# Patient Record
Sex: Male | Born: 1935 | Race: White | Hispanic: No | Marital: Married | Smoking: Former smoker
Health system: Southern US, Community
[De-identification: ages and names within clinical notes are randomized; demographics above are authoritative.]

## PROBLEM LIST (undated history)

## (undated) DIAGNOSIS — I6012 Nontraumatic subarachnoid hemorrhage from left middle cerebral artery: Secondary | ICD-10-CM

## (undated) DIAGNOSIS — K922 Gastrointestinal hemorrhage, unspecified: Secondary | ICD-10-CM

## (undated) DIAGNOSIS — I951 Orthostatic hypotension: Secondary | ICD-10-CM

## (undated) DIAGNOSIS — I10 Essential (primary) hypertension: Secondary | ICD-10-CM

## (undated) DIAGNOSIS — Z95818 Presence of other cardiac implants and grafts: Secondary | ICD-10-CM

## (undated) DIAGNOSIS — C61 Malignant neoplasm of prostate: Secondary | ICD-10-CM

## (undated) DIAGNOSIS — L03113 Cellulitis of right upper limb: Secondary | ICD-10-CM

## (undated) DIAGNOSIS — C189 Malignant neoplasm of colon, unspecified: Secondary | ICD-10-CM

## (undated) DIAGNOSIS — G20 Parkinson's disease: Secondary | ICD-10-CM

## (undated) DIAGNOSIS — Z79899 Other long term (current) drug therapy: Secondary | ICD-10-CM

## (undated) DIAGNOSIS — G459 Transient cerebral ischemic attack, unspecified: Secondary | ICD-10-CM

## (undated) DIAGNOSIS — R55 Syncope and collapse: Secondary | ICD-10-CM

## (undated) DIAGNOSIS — G4700 Insomnia, unspecified: Secondary | ICD-10-CM

## (undated) DIAGNOSIS — D649 Anemia, unspecified: Secondary | ICD-10-CM

## (undated) DIAGNOSIS — R404 Transient alteration of awareness: Secondary | ICD-10-CM

## (undated) DIAGNOSIS — E785 Hyperlipidemia, unspecified: Secondary | ICD-10-CM

## (undated) DIAGNOSIS — S065X9A Traumatic subdural hemorrhage with loss of consciousness of unspecified duration, initial encounter: Secondary | ICD-10-CM

## (undated) DIAGNOSIS — G4733 Obstructive sleep apnea (adult) (pediatric): Secondary | ICD-10-CM

## (undated) DIAGNOSIS — I639 Cerebral infarction, unspecified: Secondary | ICD-10-CM

## (undated) DIAGNOSIS — I480 Paroxysmal atrial fibrillation: Secondary | ICD-10-CM

## (undated) DIAGNOSIS — E559 Vitamin D deficiency, unspecified: Secondary | ICD-10-CM

## (undated) DIAGNOSIS — W19XXXA Unspecified fall, initial encounter: Secondary | ICD-10-CM

## (undated) HISTORY — PX: HERNIA REPAIR: SHX51

## (undated) HISTORY — DX: Obstructive sleep apnea (adult) (pediatric): G47.33

## (undated) HISTORY — DX: Anemia, unspecified: D64.9

## (undated) HISTORY — DX: Cerebral infarction, unspecified: I63.9

## (undated) HISTORY — DX: Malignant neoplasm of prostate: C61

## (undated) HISTORY — DX: Parkinson's disease: G20

## (undated) HISTORY — DX: Presence of other cardiac implants and grafts: Z95.818

## (undated) HISTORY — DX: Malignant neoplasm of colon, unspecified: C18.9

## (undated) HISTORY — DX: Hyperlipidemia, unspecified: E78.5

## (undated) HISTORY — DX: Essential (primary) hypertension: I10

## (undated) HISTORY — PX: ORCHIECTOMY: SHX2116

## (undated) HISTORY — DX: Nontraumatic subarachnoid hemorrhage from left middle cerebral artery: I60.12

## (undated) HISTORY — DX: Transient cerebral ischemic attack, unspecified: G45.9

## (undated) HISTORY — DX: Transient alteration of awareness: R40.4

## (undated) HISTORY — DX: Cellulitis of right upper limb: L03.113

## (undated) HISTORY — DX: Traumatic subdural hemorrhage with loss of consciousness of unspecified duration, initial encounter: S06.5X9A

## (undated) HISTORY — DX: Insomnia, unspecified: G47.00

## (undated) HISTORY — DX: Unspecified fall, initial encounter: W19.XXXA

## (undated) HISTORY — DX: Orthostatic hypotension: I95.1

## (undated) HISTORY — PX: PROSTATECTOMY: SHX69

## (undated) HISTORY — DX: Other long term (current) drug therapy: Z79.899

## (undated) HISTORY — DX: Syncope and collapse: R55

## (undated) HISTORY — DX: Vitamin D deficiency, unspecified: E55.9

## (undated) HISTORY — DX: Gastrointestinal hemorrhage, unspecified: K92.2

## (undated) HISTORY — DX: Paroxysmal atrial fibrillation: I48.0

## (undated) HISTORY — PX: COLECTOMY: SHX59

---

## 2000-08-03 ENCOUNTER — Encounter: Payer: Self-pay | Admitting: Emergency Medicine

## 2000-08-04 ENCOUNTER — Inpatient Hospital Stay (HOSPITAL_COMMUNITY): Admission: EM | Admit: 2000-08-04 | Discharge: 2000-08-04 | Payer: Self-pay | Admitting: Emergency Medicine

## 2012-09-23 DIAGNOSIS — C61 Malignant neoplasm of prostate: Secondary | ICD-10-CM | POA: Insufficient documentation

## 2012-09-23 DIAGNOSIS — I10 Essential (primary) hypertension: Secondary | ICD-10-CM

## 2012-09-23 HISTORY — DX: Essential (primary) hypertension: I10

## 2014-05-31 ENCOUNTER — Ambulatory Visit (INDEPENDENT_AMBULATORY_CARE_PROVIDER_SITE_OTHER): Payer: Medicare Other | Admitting: Pulmonary Disease

## 2014-05-31 ENCOUNTER — Other Ambulatory Visit: Payer: Self-pay | Admitting: *Deleted

## 2014-05-31 ENCOUNTER — Encounter: Payer: Self-pay | Admitting: *Deleted

## 2014-05-31 ENCOUNTER — Encounter (INDEPENDENT_AMBULATORY_CARE_PROVIDER_SITE_OTHER): Payer: Self-pay

## 2014-05-31 VITALS — BP 132/72 | HR 67 | Temp 98.0°F | Ht 72.0 in | Wt 172.6 lb

## 2014-05-31 DIAGNOSIS — G4733 Obstructive sleep apnea (adult) (pediatric): Secondary | ICD-10-CM | POA: Insufficient documentation

## 2014-05-31 NOTE — Patient Instructions (Signed)
Will set your machine on the auto setting, which will adjust itself to your needs.   Keep up with mask changes and supplies. followup with me again in one year, but call if issues arise with your cpap.

## 2014-05-31 NOTE — Assessment & Plan Note (Signed)
The patient has a history of obstructive sleep apnea, and he has been on C Pap since diagnosis 5 years ago. He is wearing the device compliantly, and has seen a definite improvement in his sleep and daytime alertness and energy level. He has not had any adjustments to his machine over the years, and unfortunately we are not able to get a download today because his card is not pushed into the device properly.  I would like to put his pressure on the automatic setting, and get a download definitely 6 weeks to see how things are going. I have also encouraged him to keep up with his mask changes and supplies, and let us know if he is not sleeping well with the device. I would like to see him back in one year if things are going well.

## 2014-05-31 NOTE — Progress Notes (Signed)
Subjective:    Patient ID: Joe Glover, male    DOB: Jul 26, 1924, 80 y.o.   MRN: [id number]  HPI The patient is an 80 year old male who I've been asked to see for management of obstructive sleep apnea. He tells me that he was diagnosed 5 years ago, and has been on C Pap since that time. He has been compliant with the device, and feels that it has definitely helped his sleep and daytime alertness. He currently uses a full face mask, and he has kept up with exchanges. His pressure has not been checked or changed since he has gotten the device, and unfortunately his card is not pushed into the device properly in order for [place] to record and also download data. The wife has not heard breakthrough snoring, and the patient feels that he is rested in the morning if he has a normal night. He has no significant sleepiness during the day with inactivity, but does take a daily afternoon nap. He denies any sleepiness with driving, but his wife states he does this very rarely and only locally. The patient states that his weight is been neutral over the last few years, and his Epworth score today is 13.   Sleep Questionnaire What time do you typically go to bed?( Between what hours) 10:30 PM 10:30 PM at 1456 on 05/31/14 by Inge Rise, CMA How long does it take you to fall asleep? 30 min 30 min at 1456 on 05/31/14 by Inge Rise, CMA How many times during the night do you wake up? What time do you get out of bed to start your day? 0730 0730 at 1456 on 05/31/14 by Inge Rise, CMA Do you drive or operate heavy machinery in your occupation? No No at 1456 on 05/31/14 by Inge Rise, CMA How much has your weight changed (up or down) over the past two years? (In pounds) 5 lb (2.268 kg) 5 lb (2.268 kg) at 1456 on 05/31/14 by Inge Rise, CMA Have you ever had a sleep study before? Yes Yes at 1456 on 05/31/14 by Inge Rise, CMA If yes, location of study? Dr. Alcide [facility] office Dr. [facility] office at  1456 on 05/31/14 by Inge Rise, CMA If yes, date of study? 5 years ago 5 years ago at 1456 on 05/31/14 by Inge Rise, CMA Do you currently use CPAP? Yes Yes at 1456 on 05/31/14 by Inge Rise, CMA If so, what pressure? not sure not sure at 1456 on 05/31/14 by Inge Rise, CMA Do you wear oxygen at any time? No    Review of Systems  Constitutional: Negative for fever and unexpected weight change.  HENT: Negative for congestion, dental problem, ear pain, nosebleeds, postnasal drip, rhinorrhea, sinus pressure, sneezing, sore throat and trouble swallowing.   Eyes: Negative for redness and itching.  Respiratory: Negative for cough, chest tightness, shortness of breath and wheezing.   Cardiovascular: Negative for palpitations and leg swelling.  Gastrointestinal: Negative for nausea and vomiting.  Genitourinary: Negative for dysuria.  Musculoskeletal: Negative for joint swelling.  Skin: Negative for rash.  Neurological: Negative for headaches.  Hematological: Does not bruise/bleed easily.  Psychiatric/Behavioral: Negative for dysphoric mood. The patient is not nervous/anxious.        Objective:   Physical Exam Constitutional:  Well developed, no acute distress  HENT:  Nares patent without discharge  Oropharynx without exudate, palate and uvula are normal  Eyes:  Perrla, eomi, no scleral icterus  Neck:  No JVD, no TMG  Cardiovascular:  Normal rate, regular rhythm, no rubs or gallops.  +murmur        Intact distal pulses but decreased.  Pulmonary :  Normal breath sounds, no stridor or respiratory distress   No rales, rhonchi, or wheezing  Abdominal:  Soft, nondistended, bowel sounds present.  No tenderness noted.   Musculoskeletal:  mild lower extremity edema noted.  Lymph Nodes:  No cervical lymphadenopathy noted  Skin:  No cyanosis noted  Neurologic:  Alert, appropriate, moves all 4 extremities without obvious deficit.         Assessment & Plan:

## 2014-06-08 ENCOUNTER — Telehealth: Payer: Self-pay | Admitting: Pulmonary Disease

## 2014-06-08 NOTE — Telephone Encounter (Signed)
According to [facility] referral 05/31/14 order was faxed [facility] in [place]. Called spoke with pt spouse and she reports she not heard from [facility] regarding set up. Called [facility] in [place] [phone]. Spoke with Vickii Chafe and advised order was received. Peggy left a message for RT to call us back with update Will await call back

## 2014-06-09 NOTE — Telephone Encounter (Signed)
Beth calling from [facility] stating that they contacted pt and they are bringing the machine in.Hillery Hunter

## 2014-06-09 NOTE — Telephone Encounter (Signed)
lmtcb X1 to relay info to pt .

## 2014-06-09 NOTE — Telephone Encounter (Signed)
pts wife called back and she stated that they have already brought the machine out and nothing further is needed.

## 2014-08-30 ENCOUNTER — Telehealth: Payer: Self-pay | Admitting: Pulmonary Disease

## 2014-10-10 ENCOUNTER — Encounter (HOSPITAL_COMMUNITY): Payer: Self-pay | Admitting: *Deleted

## 2014-10-10 ENCOUNTER — Emergency Department (HOSPITAL_COMMUNITY)
Admission: EM | Admit: 2014-10-10 | Discharge: 2014-10-10 | Disposition: A | Discharge status: Home / Self Care | Payer: Medicare Other | Attending: Emergency Medicine | Admitting: Emergency Medicine

## 2014-10-10 ENCOUNTER — Emergency Department (HOSPITAL_COMMUNITY): Payer: Medicare Other

## 2014-10-10 DIAGNOSIS — Z87891 Personal history of nicotine dependence: Secondary | ICD-10-CM | POA: Diagnosis not present

## 2014-10-10 DIAGNOSIS — Z8546 Personal history of malignant neoplasm of prostate: Secondary | ICD-10-CM | POA: Diagnosis not present

## 2014-10-10 DIAGNOSIS — G20 Parkinson's disease: Secondary | ICD-10-CM | POA: Insufficient documentation

## 2014-10-10 DIAGNOSIS — E785 Hyperlipidemia, unspecified: Secondary | ICD-10-CM | POA: Diagnosis not present

## 2014-10-10 DIAGNOSIS — R55 Syncope and collapse: Secondary | ICD-10-CM | POA: Insufficient documentation

## 2014-10-10 DIAGNOSIS — Z8673 Personal history of transient ischemic attack (TIA), and cerebral infarction without residual deficits: Secondary | ICD-10-CM | POA: Insufficient documentation

## 2014-10-10 DIAGNOSIS — R4189 Other symptoms and signs involving cognitive functions and awareness: Secondary | ICD-10-CM

## 2014-10-10 DIAGNOSIS — E559 Vitamin D deficiency, unspecified: Secondary | ICD-10-CM | POA: Diagnosis not present

## 2014-10-10 DIAGNOSIS — I10 Essential (primary) hypertension: Secondary | ICD-10-CM | POA: Diagnosis not present

## 2014-10-10 DIAGNOSIS — Z880 Allergy status to penicillin: Secondary | ICD-10-CM | POA: Diagnosis not present

## 2014-10-10 DIAGNOSIS — Z86010 Personal history of colonic polyps: Secondary | ICD-10-CM | POA: Insufficient documentation

## 2014-10-10 DIAGNOSIS — Z85038 Personal history of other malignant neoplasm of large intestine: Secondary | ICD-10-CM | POA: Diagnosis not present

## 2014-10-10 DIAGNOSIS — I480 Paroxysmal atrial fibrillation: Secondary | ICD-10-CM | POA: Insufficient documentation

## 2014-10-10 LAB — CBC WITH DIFFERENTIAL/PLATELET
BASOS ABS: 0 10*3/uL (ref 0.0–0.1)
Basophils Relative: 1 % (ref 0–1)
EOS PCT: 2 % (ref 0–5)
Eosinophils Absolute: 0.2 10*3/uL (ref 0.0–0.7)
HCT: 40 % (ref 39.0–52.0)
Hemoglobin: 13.3 g/dL (ref 13.0–17.0)
Lymphocytes Relative: 20 % (ref 12–46)
Lymphs Abs: 1.3 10*3/uL (ref 0.7–4.0)
MCH: 26.9 pg (ref 26.0–34.0)
MCHC: 33.3 g/dL (ref 30.0–36.0)
MCV: 81 fL (ref 78.0–100.0)
Monocytes Absolute: 0.6 10*3/uL (ref 0.1–1.0)
Monocytes Relative: 9 % (ref 3–12)
NEUTROS ABS: 4.4 10*3/uL (ref 1.7–7.7)
NEUTROS PCT: 68 % (ref 43–77)
PLATELETS: 182 10*3/uL (ref 150–400)
RBC: 4.94 MIL/uL (ref 4.22–5.81)
RDW: 14.8 % (ref 11.5–15.5)
WBC: 6.4 10*3/uL (ref 4.0–10.5)

## 2014-10-10 LAB — COMPREHENSIVE METABOLIC PANEL
ALT: 19 U/L (ref 0–53)
AST: 23 U/L (ref 0–37)
Albumin: 3.6 g/dL (ref 3.5–5.2)
Alkaline Phosphatase: 81 U/L (ref 39–117)
Anion gap: 5 (ref 5–15)
BUN: 16 mg/dL (ref 6–23)
CALCIUM: 8.6 mg/dL (ref 8.4–10.5)
CO2: 27 mmol/L (ref 19–32)
Chloride: 102 mmol/L (ref 96–112)
Creatinine, Ser: 1.25 mg/dL (ref 0.50–1.35)
GFR calc Af Amer: 58 mL/min — ABNORMAL LOW (ref >90)
GFR, EST NON AFRICAN AMERICAN: 50 mL/min — AB (ref >90)
Glucose, Bld: 111 mg/dL — ABNORMAL HIGH (ref 70–99)
Potassium: 3.9 mmol/L (ref 3.5–5.1)
SODIUM: 134 mmol/L — AB (ref 135–145)
TOTAL PROTEIN: 6.1 g/dL (ref 6.0–8.3)
Total Bilirubin: 0.6 mg/dL (ref 0.3–1.2)

## 2014-10-10 LAB — I-STAT TROPONIN, ED: TROPONIN I, POC: 0.01 ng/mL (ref 0.00–0.08)

## 2014-10-10 LAB — URINALYSIS, ROUTINE W REFLEX MICROSCOPIC
GLUCOSE, UA: NEGATIVE mg/dL
Hgb urine dipstick: NEGATIVE
KETONES UR: 15 mg/dL — AB
Leukocytes, UA: NEGATIVE
Nitrite: NEGATIVE
Protein, ur: NEGATIVE mg/dL
Specific Gravity, Urine: 1.021 (ref 1.005–1.030)
Urobilinogen, UA: 0.2 mg/dL (ref 0.0–1.0)
pH: 5.5 (ref 5.0–8.0)

## 2014-10-10 LAB — CBG MONITORING, ED: GLUCOSE-CAPILLARY: 140 mg/dL — AB (ref 70–99)

## 2014-10-10 MED ORDER — SODIUM CHLORIDE 0.9 % IV SOLN
1000.0000 mL | Freq: Once | INTRAVENOUS | Status: AC
  Administered 2014-10-10: 1000 mL via INTRAVENOUS

## 2014-10-10 MED ORDER — SODIUM CHLORIDE 0.9 % IV SOLN: 1000.0000 mL | INTRAVENOUS | Status: DC

## 2014-10-10 MED ORDER — SODIUM CHLORIDE 0.9 % IV SOLN: 20.0000 mL | INTRAVENOUS | Status: DC

## 2014-10-10 NOTE — ED Notes (Signed)
Interrogated loop recorded and downloaded.

## 2014-10-10 NOTE — ED Provider Notes (Signed)
CSN: [id number]     Arrival date & time 10/10/14  4098 History   First MD Initiated Contact with Patient 10/10/14 1010     Chief Complaint  Patient presents with  . Near Syncope     (Consider location/radiation/quality/duration/timing/severity/associated sxs/prior Treatment) HPI 81 year old male who is living at home presents today with an episode of blank staring. He has had multiple episodes over several years of this. He has been evaluated by neurology and cardiology. He is currently wearing a loop recorder.  This has occurred multiple times over several years. The most recent time before today was 2 weeks ago. He momentarily stares into space and does not respond to them for several minutes. He did not fall with sitting in a chair today. He rapidly returns to baseline after the events. Currently has no complaints. There is no shaking noted, loss of continence does not occur. The past he has been seen by neurology and they did not think that he was having seizures. His wife is currently upstairs undergoing surgery. He is here with his sons and daughter. They're very familiar with these episodes. Past Medical History  Diagnosis Date  . TIA (transient ischemic attack)   . Hyperlipidemia   . Stroke   . Hypertension   . Colon polyp   . OSA (obstructive sleep apnea)   . GI bleed   . Vitamin D deficiency   . Parkinson disease   . PAF (paroxysmal atrial fibrillation)   . Prostate cancer   . Colon cancer    Past Surgical History  Procedure Laterality Date  . Colectomy    . Hernia repair    . Orchlectomy    . Prostatectomy     No family history on file. History  Substance Use Topics  . Smoking status: Former Smoker -- 2.00 packs/day for 20 years    Types: Cigarettes    Quit date: 07/08/1958  . Smokeless tobacco: Not on file  . Alcohol Use: 0.0 oz/week    0 Standard drinks or equivalent per week     Comment: occasional    Review of Systems  All other systems reviewed and are  negative.     Allergies  Anacin; Augmentin; Hemocyte plus; and Penicillins  Home Medications   Prior to Admission medications   Medication Sig Start Date End Date Taking? Authorizing Provider  amiodarone (PACERONE) 200 MG tablet 1 tab twice daily 04/26/14   Historical Provider, MD  Azelastine HCl 0.15 % SOLN Place 2 sprays into the nose 2 (two) times daily.    Historical Provider, MD  carbonyl iron (FEOSOL) 45 MG TABS tablet Take 45 mg by mouth 2 (two) times daily.    Historical Provider, MD  Cholecalciferol (VITAMIN D) 2000 UNITS CAPS Take by mouth daily.    Historical Provider, MD  cloNIDine (CATAPRES) 0.1 MG tablet 1 in AM and 2 at night 05/16/14   Historical Provider, MD  Cyanocobalamin (VITAMIN B-12 IJ) Inject as directed. Once a month    Historical Provider, MD  hydrochlorothiazide (HYDRODIURIL) 12.5 MG tablet One on m,w,f 03/23/14   Historical Provider, MD  loratadine (CLARITIN) 10 MG tablet Take 10 mg by mouth daily.    Historical Provider, MD  Magnesium Oxide 400 (240 MG) MG TABS Take 2 tablets by mouth 2 (two) times daily. 2 twice daily 05/14/14   Historical Provider, MD  omeprazole (PRILOSEC) 20 MG capsule 1 tab twice daily 04/26/14   Historical Provider, MD  potassium chloride (KLOR-CON) 20 MEQ packet Take  20 mEq by mouth daily.    Historical Provider, MD  pravastatin (PRAVACHOL) 40 MG tablet Once daily 05/07/14   Historical Provider, MD  psyllium (METAMUCIL) 58.6 % packet Take 1 packet by mouth daily.    Historical Provider, MD  ranitidine (ZANTAC) 300 MG tablet Take 300 mg by mouth daily. 04/26/14   Historical Provider, MD  temazepam (RESTORIL) 15 MG capsule 30 mg. 1 at bedtime as needed 04/26/14   Historical Provider, MD   BP 118/79 mmHg  Pulse 66  Temp(Src) 97.5 F (36.4 C) (Axillary)  Resp 15  SpO2 96% Physical Exam  Constitutional: He is oriented to person, place, and time. He appears well-developed and well-nourished.  HENT:  Head: Normocephalic and atraumatic.   Right Ear: External ear normal.  Left Ear: External ear normal.  Nose: Nose normal.  Mouth/Throat: Oropharynx is clear and moist.  Eyes: Conjunctivae and EOM are normal. Pupils are equal, round, and reactive to light.  Neck: Normal range of motion. Neck supple.  Cardiovascular: Normal rate, regular rhythm, normal heart sounds and intact distal pulses.   Pulmonary/Chest: Effort normal and breath sounds normal. No respiratory distress. He has no wheezes. He exhibits no tenderness.  Abdominal: Soft. Bowel sounds are normal. He exhibits no distension and no mass. There is no tenderness. There is no guarding.  Musculoskeletal: Normal range of motion.  Neurological: He is alert and oriented to person, place, and time. He has normal reflexes. He exhibits normal muscle tone. Coordination normal.  Skin: Skin is warm and dry.  Psychiatric: He has a normal mood and affect. His behavior is normal. Judgment and thought content normal.  Nursing note and vitals reviewed.   ED Course  Procedures (including critical care time) Labs Review Labs Reviewed - No data to display  Imaging Review No results found.   EKG Interpretation   Date/Time:  Monday October 10 2014 10:02:39 EDT Ventricular Rate:  72 PR Interval:  144 QRS Duration: 130 QT Interval:  472 QTC Calculation: 517 R Axis:   -67 Text Interpretation:  Sinus rhythm Multiform ventricular premature  complexes Aberrant conduction of SV complex(es) Nonspecific IVCD with LAD  Left ventricular hypertrophy Confirmed by Neela Zecca MD, Andee Poles ([id number]) on  10/10/2014 10:11:40 AM      MDM   Final diagnoses:  Syncope  Unresponsive episode    I reviewed the report from [place]. They list 11 events since the loop recorder was placed. They have had 0 episodes of tachycardia, 0 episodes of pauses, 0 episodes of bradycardia, 0 episodes of atrial tachycardia or fibrillation. I think it is unlikely based on this information that these are the result of a  cardiac event. From history they sound more consistent with absence seizures. I offered hospitalization to the family but they do not wish her and be hospitalized at this time. I agree that his unlikely that he would have benefit from hospitalization. However, I do advise that he is followed up with his primary care physician tomorrow. And, I advised the son stays with him as his wife is in the hospital. The family and the patient voiced understanding of the plan, return precautions, and plan for follow-up.  Pattricia Boss, MD 10/11/14 [phone]

## 2014-10-10 NOTE — ED Notes (Signed)
Pt cbg 140

## 2014-10-10 NOTE — ED Notes (Signed)
Pt presents via POV.  Pt was upstairs with wife who is having surgery, his son came back from the bathroom and he had a blank stare and wouldn't respond to him.  Pt has history of episodes similar to this, has "chip" in heart to try to figure out why this is happening.  Pt doesn't remember what happened, A x4 on arrival.  NAD.

## 2014-10-10 NOTE — Discharge Instructions (Signed)
Please recheck with his doctor tomorrow.  Please have someone stay with patient at all times.  Return if worse- especially weaker, fever, or pain.

## 2014-10-26 ENCOUNTER — Telehealth: Payer: Self-pay | Admitting: Neurology

## 2014-10-26 NOTE — Telephone Encounter (Signed)
Pt wife Joe Glover called and states that we need to call for the hospital records for pt appt 11-02-14 [facility] she gave me this phone number [phone]

## 2014-10-26 NOTE — Telephone Encounter (Signed)
Spoke with patient's wife. Patient was seen at [facility] ED in March. I have faxed request for those records to [facility] [phone].

## 2014-11-02 ENCOUNTER — Ambulatory Visit (INDEPENDENT_AMBULATORY_CARE_PROVIDER_SITE_OTHER): Payer: Medicare Other | Admitting: Neurology

## 2014-11-02 ENCOUNTER — Encounter: Payer: Self-pay | Admitting: Neurology

## 2014-11-02 VITALS — BP 130/82 | HR 68 | Resp 16 | Wt 164.0 lb

## 2014-11-02 DIAGNOSIS — R55 Syncope and collapse: Secondary | ICD-10-CM

## 2014-11-02 DIAGNOSIS — R404 Transient alteration of awareness: Secondary | ICD-10-CM | POA: Insufficient documentation

## 2014-11-02 HISTORY — DX: Transient alteration of awareness: R40.4

## 2014-11-02 HISTORY — DX: Syncope and collapse: R55

## 2014-11-02 NOTE — Progress Notes (Signed)
NEUROLOGY CONSULTATION NOTE  Joe Glover MRN: [id number] DOB: 01/02/1925  Referring provider: Dr. Pattricia Boss (ER) Primary care provider: Dr. Nelda Bucks  Reason for consult:  syncope  Dear Dr Jeanell Sparrow:  Thank you for your kind referral of Joe Glover for consultation of the above symptoms. Although his history is well known to you, please allow me to reiterate it for the purpose of our medical record. The patient was accompanied to the clinic by his wife who also provides collateral information. Records and images were personally reviewed where available.  HISTORY OF PRESENT ILLNESS: This is an 81 year old right-handed man with a history of hypertension, hyperlipidemia, paroxysmal atrial fibrillation, OSA, prostate and colon cancer, presenting after ER visit for recurrent syncope. His wife reports that he has been having these symptoms for the past 10 years. He had seen neurologist Dr. Dione Booze in 2007 and were told they did not think these were seizures. There is a note that he had a lumbar puncture at one point. Sometimes he feels a warning "like the lights are going out," sometimes these occur without warning. He is standing or sitting up mostly, he would momentarily stare, then start falling. He has fallen to the floor with brief loss of consciousness, no convulsive activity. He had urinary incontinence one time. No episodes when he is lying down. He was initially having syncopal episodes every 6 months (passed out 2 years in a row on Christmas), however has been having them now every 1-2 months. Last episode was 10/10/14 while in the hospital waiting room when his wife was admitted. They report he was wearing his abdominal binder at that time. His wife reports his SBP would be <100 during the syncopal episodes, his baseline is usually in the 130-150 range. He sees cardiologist Dr. [name] and has a loop recorder. He has had 3 episodes captured without bradycardia or atrial fibrillation.  His BP medications were adjusted and thigh-high support hose, increased fluid intake were recommended. He denies any further episodes of loss of consciousness since 10/10/14, however continues to have "sinking spells" every week, with a sensation like he would pass out. His BP would be lower during those episodes.   He had seen neurologist Dr. Metta Clines in 2014 after he was noted to have PD symptoms of tremor, shuffling gait, and softer voice at [facility]. Per notes, exam showed more of an intention tremor consistent with benign essential tremor, but he had a positive Myersons sign, increased tone with some cogwheeling with augmentation. His gait was not shuffling, with good arm swing. His wife reported gait had improved markedly with PT. 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 He was last seen by Dr. Metta Clines 09/2012 ad was given samples of Azilect. It appears the patient did not take these. They were offered a PET scan which they were also reluctant to do at that time.  He denies any headaches, vision changes, tinnitus, tunnel vision, tremors, anosmia. He occasionally acts out his dreams (walking in bed). He feels his legs are weak, right>left, no paresthesias. He ambulates with a cane. He has occasional constipation. He has occasional swallowing difficulties and clears his throat frequently. He is dysarthric and noted to have drooling today, his wife reports this started 3 years ago when he was diagnosed with a stroke, and slurred speech has gradually gotten worse. He denies any neck/back  pain. No family history of syncope. His mother had Parkinson's disease.   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  Laboratory Data: Lab Results  Component Value Date   WBC 6.4 10/10/2014   HGB 13.3 10/10/2014   HCT 40.0 10/10/2014   MCV 81.0 10/10/2014   PLT 182 10/10/2014     Chemistry      Component Value Date/Time   NA 134* 10/10/2014 1150   K 3.9 10/10/2014 1150   CL 102 10/10/2014 1150   CO2 27 10/10/2014 1150   BUN 16 10/10/2014 1150   CREATININE 1.25 10/10/2014 1150      Component Value Date/Time   CALCIUM 8.6 10/10/2014 1150   ALKPHOS 81 10/10/2014 1150   AST 23 10/10/2014 1150   ALT 19 10/10/2014 1150   BILITOT 0.6 10/10/2014 1150      PAST MEDICAL HISTORY: Past Medical History  Diagnosis Date  . TIA (transient ischemic attack)   . Hyperlipidemia   . Stroke   . Hypertension   . Colon polyp   . OSA (obstructive sleep apnea)   . GI bleed   . Vitamin D deficiency   . Parkinson disease   . PAF (paroxysmal atrial fibrillation)   . Prostate cancer   . Colon cancer     PAST SURGICAL HISTORY: Past Surgical History  Procedure Laterality Date  . Colectomy    . Hernia repair    . Orchlectomy    . Prostatectomy      MEDICATIONS: Current Outpatient Prescriptions on File Prior to Visit  Medication Sig Dispense Refill  . amiodarone (PACERONE) 200 MG tablet 1/2 tablet every morning  0  . Azelastine HCl 0.15 % SOLN Place 2 sprays into the nose 2 (two) times daily.    . Cholecalciferol (VITAMIN D) 2000 UNITS CAPS Take by mouth daily. Takes 2 capsules daily    . cloNIDine (CATAPRES) 0.1 MG tablet 1/2 tablet every evening  0  . Cyanocobalamin (VITAMIN B-12 IJ) Inject as directed. Once a month    . loratadine (CLARITIN) 10 MG tablet Take 10 mg by mouth daily.    [name] Kitchen omeprazole (PRILOSEC) 20 MG capsule 1 tab twice daily  1  . pravastatin (PRAVACHOL) 40 MG tablet Once daily  1  . psyllium (METAMUCIL) 58.6 % packet Take 1 packet by mouth daily.    . ranitidine (ZANTAC) 300 MG tablet Take 300 mg by mouth daily.   1  . temazepam (RESTORIL) 15 MG capsule 15 mg at bedtime.   0   No current facility-administered medications on file prior to visit.    ALLERGIES: Allergies  Allergen Reactions  . Anacin [Aspirin]     GI bleed  . Augmentin [Amoxicillin-Pot Clavulanate]     hives  . Hemocyte Plus [Hematinic Plus Vit-Minerals]   . Penicillins     hives    FAMILY HISTORY: Family History  Problem Relation Age of Onset  . Parkinson's disease Mother   . Heart attack Father     SOCIAL HISTORY: History   Social History  . Marital Status: Married    Spouse Name: N/A  . Number of Children: 2  . Years of Education: N/A   Occupational History  . retired    Social History Main Topics  . Smoking status: Former Smoker -- 2.00  packs/day for 20 years    Types: Cigarettes    Quit date: 07/08/1958  . Smokeless tobacco: Never Used  . Alcohol Use: 0.0 oz/week    0 Standard drinks or equivalent per week     Comment: occasional  . Drug Use: No  . Sexual Activity: Not on file   Other Topics Concern  . Not on file   Social History Narrative    REVIEW OF SYSTEMS: Constitutional: No fevers, chills, or sweats, no generalized fatigue, change in appetite Eyes: No visual changes, double vision, eye pain Ear, nose and throat: No hearing loss, ear pain, nasal congestion, sore throat Cardiovascular: No chest pain, palpitations Respiratory:  No shortness of breath at rest or with exertion, wheezes GastrointestinaI: No nausea, vomiting, diarrhea, abdominal pain, fecal incontinence Genitourinary:  No dysuria, urinary retention or frequency Musculoskeletal:  No neck pain, back pain Integumentary: No rash, pruritus, skin lesions Neurological: as above Psychiatric: No depression, insomnia, anxiety Endocrine: No palpitations, fatigue, diaphoresis, mood swings, change in appetite, change in weight, increased thirst Hematologic/Lymphatic:  No anemia, purpura, petechiae. Allergic/Immunologic: no itchy/runny  eyes, nasal congestion, recent allergic reactions, rashes  PHYSICAL EXAM: Filed Vitals:   11/02/14 1247  BP: 130/82  Pulse: 68  Resp: 16   General: No acute distress. +hypomimia, hypophonia but also dysarthric Head:  Normocephalic/atraumatic Eyes: Fundoscopic exam shows bilateral sharp discs, no vessel changes, exudates, or hemorrhages Neck: supple, no paraspinal tenderness, full range of motion Back: No paraspinal tenderness Heart: regular rate and rhythm Lungs: Clear to auscultation bilaterally. Vascular: No carotid bruits. Skin/Extremities: No rash, no edema Neurological Exam: Mental status: alert and oriented to person, place, and time, +moderate dysarthria, no aphasia, Fund of knowledge is appropriate.  Remote memory intact.  Attention and concentration are impaired.    Able to name objects and repeat phrases.  Montreal Cognitive Assessment  11/02/2014  Visuospatial/ Executive (0/5) 3  Naming (0/3) 1  Attention: Read list of digits (0/2) 2  Attention: Read list of letters (0/1) 0  Attention: Serial 7 subtraction starting at 100 (0/3) 2  Language: Repeat phrase (0/2) 2  Language : Fluency (0/1) 0  Abstraction (0/2) 2  Delayed Recall (0/5) 3  Orientation (0/6) 4  Total 19  Adjusted Score (based on education) 19   Cranial nerves: CN I: not tested CN II: pupils asymmetric, left pupil 20mm, right pupil 68mm, both reactive to light, visual fields intact, fundi unremarkable. CN III, IV, VI:  full range of motion but ?some restricted upgaze, no nystagmus, no ptosis CN V: facial sensation intact CN VII: upper and lower face symmetric CN VIII: hearing intact to finger rub CN IX, X: gag intact, uvula midline CN XI: sternocleidomastoid and trapezius muscles intact CN XII: tongue midline Bulk & Tone: normal, no cogwheeling, no fasciculations. Motor: 5/5 throughout with no pronator drift. Sensation: intact to light touch, cold, pin, vibration and joint position sense.  No extinction  to double simultaneous stimulation.  Romberg test negative Deep Tendon Reflexes: +1 throughout, no ankle clonus Plantar responses: downgoing bilaterally Cerebellar: no incoordination on finger to nose, heel to shin. No dysdiadochokinesia Gait: slow and cautious, no ataxia, decreased arm swing on the left. Takes 4 steps to turn. +postural instability Tremor: none  IMPRESSION: This is an 81 year old right-handed man with a history of hypertension, hyperlipidemia, paroxysmal atrial fibrillation, OSA, prostate and colon cancer, presenting for evaluation of recurrent syncope. He has been evaluated by Cardiology with unremarkable loop recorder results with 3 episodes captured.  Symptoms suggestive of neurocardiogenic syncope, less likely seizure. Routine EEG will be ordered to assess for focal abnormalities that increase risk for recurrent seizures. He has been evaluated in the past for Parkinson's disease. He does have hypomimia, hypophonia, decreased arm swing on the left, and some postural instability. No tremor or cogwheeling noted today. [place] 19/30. With orthostatic hypotension being a major component of his symptoms, Multiple System Atrophy (MSA) is a possibility versus Parkinson's disease with dysautonomia. He was advised to use both abdominal binder and elastic compression stockings. He will be referred to our Movement Disorders specialist Dr. Carles Collet for evaluation.  driving laws were discussed, he knows to stop driving after an episode of loss of consciousness until 6 months event-free.  Thank you for allowing me to participate in the care of this patient. Please do not hesitate to call for any questions or concerns.   Ellouise Newer, M.D.  CC: Dr. Delena Bali

## 2014-11-02 NOTE — Patient Instructions (Signed)
1. Schedule routine EEG 2. Refer to Dr. Carles Collet for evaluation of Parkinson's disease 3. Continue using abdominal binder and stockings

## 2014-11-07 ENCOUNTER — Ambulatory Visit (INDEPENDENT_AMBULATORY_CARE_PROVIDER_SITE_OTHER): Payer: Medicare Other | Admitting: Neurology

## 2014-11-07 DIAGNOSIS — R404 Transient alteration of awareness: Secondary | ICD-10-CM

## 2014-11-07 DIAGNOSIS — R55 Syncope and collapse: Secondary | ICD-10-CM | POA: Diagnosis not present

## 2014-11-08 ENCOUNTER — Encounter: Payer: Self-pay | Admitting: Neurology

## 2014-11-11 ENCOUNTER — Telehealth: Payer: Self-pay | Admitting: Family Medicine

## 2014-11-11 NOTE — Telephone Encounter (Signed)
Lmovm to return my call. 

## 2014-11-11 NOTE — Procedures (Signed)
ELECTROENCEPHALOGRAM REPORT  Date of Study: 11/07/2014  Patient's Name: Joe Glover MRN: [id number] Date of Birth: 1925/01/02  Referring Provider: Dr. Ellouise Newer  Clinical History: This is an 81 year old man with recurrent episodes of loss of consciousness. Sometimes he feels a warning "like the lights are going out," sometimes these occur without warning. He is standing or sitting up mostly, he would momentarily stare, then start falling. He has fallen to the floor with brief loss of consciousness.  Medications: Amiodarone, Restoril, Pravachol  Technical Summary: A multichannel digital EEG recording measured by the international 10-20 system with electrodes applied with paste and impedances below 5000 ohms performed in our laboratory with EKG monitoring in an awake and asleep patient.  Hyperventilation was not performed. Photic stimulation was performed.  The digital EEG was referentially recorded, reformatted, and digitally filtered in a variety of bipolar and referential montages for optimal display.    Description: The patient is awake and asleep during the recording.  During maximal wakefulness, there is a symmetric, medium voltage 8 Hz posterior dominant rhythm that attenuates with eye opening. This is admixed with a small amount of diffuse theta slowing of the waking background. During drowsiness and sleep, there is an increase in theta slowing of the background.  Vertex waves and symmetric sleep spindles were seen.  Photic stimulation did not elicit any abnormalities.  There were no epileptiform discharges or electrographic seizures seen.    EKG lead was unremarkable.  Impression: This awake and asleep EEG is mildly abnormal due to mild diffuse slowing of the waking background.  Clinical Correlation of the above findings indicates mild diffuse cerebral dysfunction that is non-specific in etiology and may be seen with hypoxic/ischemic injury, toxic/metabolic encephalopathies,  medication effect, or with neurodegenerative conditions. The absence of epileptiform discharges does not exclude a clinical diagnosis of epilepsy. Clinical correlation is advised.   Ellouise Newer, M.D.

## 2014-11-14 NOTE — Telephone Encounter (Signed)
Patient's wife returned my call. Notified her of below.    Pls let patient/wife know EEG did not show any seizure discharges

## 2014-11-15 ENCOUNTER — Ambulatory Visit (INDEPENDENT_AMBULATORY_CARE_PROVIDER_SITE_OTHER): Payer: Medicare Other | Admitting: Neurology

## 2014-11-15 ENCOUNTER — Encounter: Payer: Self-pay | Admitting: Neurology

## 2014-11-15 VITALS — Ht 72.0 in | Wt 164.0 lb

## 2014-11-15 DIAGNOSIS — K117 Disturbances of salivary secretion: Secondary | ICD-10-CM | POA: Diagnosis not present

## 2014-11-15 DIAGNOSIS — R471 Dysarthria and anarthria: Secondary | ICD-10-CM

## 2014-11-15 DIAGNOSIS — R55 Syncope and collapse: Secondary | ICD-10-CM

## 2014-11-15 DIAGNOSIS — R9402 Abnormal brain scan: Secondary | ICD-10-CM

## 2014-11-15 DIAGNOSIS — G214 Vascular parkinsonism: Secondary | ICD-10-CM

## 2014-11-15 MED ORDER — CARBIDOPA-LEVODOPA 25-100 MG PO TABS: 1.0000 | ORAL_TABLET | Freq: Three times a day (TID) | ORAL | Status: DC

## 2014-11-15 NOTE — Patient Instructions (Signed)
1. Start Carbidopa Levodopa as follows: 1/2 tab three times a day before meals x 1 wk, then 1/2 in am & noon & 1 in evening for a week, then 1/2 in am &1 at noon &one in evening for a week, then 1 tablet three times a day before meals. 2. We will call you with appt for Myobloc (Botox). 3. Monitor blood pressure.

## 2014-11-15 NOTE — Progress Notes (Signed)
Joe Glover was seen today in the movement disorders clinic for neurologic consultation at the request of Dr. Delice Lesch.  Pt is accompanied by his wife who supplements the hx.  Records were reviewed today from Dr. [name], Dr. Delice Lesch and Dr. Metta Clines.  The patient has been having multiple syncopal episodes which have been increasing with the course of time.  Wife thinks that these started 2-4 years ago.  He is now having episodes about 1  time per month (3 months ago was having weekly but are lessening).  Both Dr. [name] and Dr. Delice Lesch expressed concerns that perhaps these were due to orthostatic hypotension, perhaps from a movement disorder.  He has had 3 episodes with his loop recorder implanted, and there has been no evidence of bradycardia or atrial fibrillation.  His blood pressure medications have been decreased because of this, and he wears an abdominal binder and it is recommended that he wear thigh-high TED hose as well but he is wearing knee-high instead.  His wife states that his last episode was in church on Sunday.  He was just sitting there and then "zoned out."  He was able to continue sitting up.  He is unresponsive to voice during an episode and patient states that he is unaware it happens.  It lasted seconds (max 30).  Never happened while laying down.  No loss of bladder/bowel control.  Wife states that his BP is always low during the events.  He seems "out of it" after the event.  As above, the patient has also seen Dr. Metta Clines with his first appointment being in March, 2014.  He was apparently evaluated at that office 7 years earlier by a different physician, but it is unclear what the presenting complaint was at that time.  Recent notes do state that they looked for possible multiple sclerosis but his lumbar puncture was negative.  When he saw Dr. Metta Clines in March, 2014 he reported that symptoms of slowness and gait instability had been going on for about one year.  When he first  saw him, he just been released from subacute rehabilitation and physical therapist noted shuffling and hypophonia and recommended that he see a neurologist.  He subsequently had an MRI of the brain demonstrating chronic small vessel disease.  He followed up with Dr. Metta Clines about a week later on 09/23/2012 and Dr. Metta Clines felt that he had Parkinson's disease (but pts family states that the dx wasn't confirmed) and started him on Azilect.  The patient did not take this medication.  Specific Symptoms:  Tremor: No. Voice: hypophonic and slurred speech (states that about 3 years ago, went in for colonoscopy and noted slurred speech so they went to the ER instead and was told that he had minor stroke.  Speech not good since then but wife insists was normal before that time) Sleep:   Vivid Dreams:  Yes.    Acting out dreams:  No. Wet Pillows: No. Postural symptoms:  Yes.   (little better since PT - ended 3 weeks ago)  Falls?  Yes.   (one time since October 10, 2014) Bradykinesia symptoms: shuffling gait, slow movements and drooling while awake Loss of smell:  No. Loss of taste:  No. Urinary Incontinence:  Yes.   (better with time but wears a pad) Difficulty Swallowing:  Yes.   (any consistency) Handwriting, micrographia: Yes.   Trouble with ADL's:  No.  Trouble buttoning clothing: No. Depression:  No. (some frustration) Memory changes:  No.   (  wife prepares pill; pt still drives; pt does finances [place]) Hallucinations:  No.  visual distortions: No. N/V:  No. Lightheaded:  Yes.    (right before he passes out)  Syncope: Yes.   Diplopia:  No. Dyskinesia:  No.  The patient is on amiodarone, but the dose was recently decreased by Dr. [name].  He had an EEG on 11/11/2014 which just demonstrated mild slowing of electrocerebral activity.  I reviewed his MRI of the brain from 02/16/2014.  There was advanced small vessel disease along with multiple old lacunar  infarctions.  ALLERGIES:   Allergies  Allergen Reactions  . Anacin [Aspirin]     GI bleed  . Augmentin [Amoxicillin-Pot Clavulanate]     hives  . Hemocyte Plus [Hematinic Plus Vit-Minerals]   . Penicillins     hives    CURRENT MEDICATIONS:  Outpatient Encounter Prescriptions as of 11/15/2014  Medication Sig  . amiodarone (PACERONE) 200 MG tablet 1/2 tablet every morning  . Azelastine HCl 0.15 % SOLN Place 2 sprays into the nose 2 (two) times daily.  . Cholecalciferol (VITAMIN D) 2000 UNITS CAPS Take by mouth daily. Takes 2 capsules daily  . cloNIDine (CATAPRES) 0.1 MG tablet 1/2 tablet every evening  . Cyanocobalamin (VITAMIN B-12 IJ) Inject as directed. Once a month  . loratadine (CLARITIN) 10 MG tablet Take 10 mg by mouth daily.  . magnesium oxide (MAG-OX) 400 MG tablet Takes  Tablets twice daily  . omeprazole (PRILOSEC) 20 MG capsule 1 tab twice daily  . OVER THE COUNTER MEDICATION Take 27 mg by mouth daily. High Potency Iron takes 2 times daily  . potassium chloride (K-DUR,KLOR-CON) 10 MEQ tablet Take 10 mEq by mouth daily. Take 1 tablet daily  . pravastatin (PRAVACHOL) 40 MG tablet Once daily  . psyllium (METAMUCIL) 58.6 % packet Take 1 packet by mouth daily.  . ranitidine (ZANTAC) 300 MG tablet Take 300 mg by mouth daily.  . temazepam (RESTORIL) 15 MG capsule 15 mg at bedtime.    No facility-administered encounter medications on file as of 11/15/2014.    PAST MEDICAL HISTORY:   Past Medical History  Diagnosis Date  . TIA (transient ischemic attack)   . Hyperlipidemia   . Stroke   . Hypertension   . Colon polyp   . OSA (obstructive sleep apnea)   . GI bleed   . Vitamin D deficiency   . Parkinson disease   . PAF (paroxysmal atrial fibrillation)   . Prostate cancer   . Colon cancer     PAST SURGICAL HISTORY:   Past Surgical History  Procedure Laterality Date  . Colectomy    . Hernia repair    . Orchiectomy    . Prostatectomy      SOCIAL HISTORY:    History   Social History  . Marital Status: Married    Spouse Name: N/A  . Number of Children: 2  . Years of Education: N/A   Occupational History  . retired    Social History Main Topics  . Smoking status: Former Smoker -- 2.00 packs/day for 20 years    Types: Cigarettes    Quit date: 07/08/1958  . Smokeless tobacco: Never Used  . Alcohol Use: 0.0 oz/week    0 Standard drinks or equivalent per week     Comment: occasional - 1-2 drinks a month  . Drug Use: No  . Sexual Activity: Not on file   Other Topics Concern  . Not on file  Social History Narrative    FAMILY HISTORY:   Family Status  Relation Status Death Age  . Mother Deceased     PD  . Father Deceased     heart attack  . Sister Deceased     alzheimers  . Sister Alive     COPD  . Brother Deceased     accident  . Brother Deceased     heart attack    ROS:  May have sneezing fit when eats.  A complete 10 system review of systems was obtained and was unremarkable apart from what is mentioned above.  PHYSICAL EXAMINATION:    VITALS:   Filed Vitals:   11/15/14 1359  Height: 6' (1.829 m)  Weight: 164 lb (74.39 kg)   172/80 (laying) with pulse 72 160/80 (sitting) with pulse 72 140/88 (standing) with pulse 76  GEN:  The patient appears stated age and is in NAD. HEENT:  Normocephalic, atraumatic.  The mucous membranes are moist. The superficial temporal arteries are without ropiness or tenderness.  He is drooling. CV:  RRR Lungs:  CTAB Neck/HEME:  There are no carotid bruits bilaterally.  Neurological examination:  Orientation: The patient is alert and oriented x3.  Relies on his wife to be the historian. Cranial nerves: There is good facial symmetry. Pupils are equal round and reactive to light bilaterally.  Funduscopic exam is attempted, but disc margins are not well visualized.  Extraocular muscles are intact with just slight decreased upgaze. The visual fields are full to confrontational testing.  The speech is fluent but dysarthric.  There is mild facial hypomimia.  Soft palate rises symmetrically and there is no tongue deviation. Hearing is intact to conversational tone. Sensation: Sensation is intact to light and pinprick throughout (facial, trunk, extremities). Vibration is decreased at the bilateral big toe. There is no extinction with double simultaneous stimulation. There is no sensory dermatomal level identified. Motor: Strength is 5/5 in the bilateral upper and lower extremities.   Shoulder shrug is equal and symmetric.  There is no pronator drift. Deep tendon reflexes: Deep tendon reflexes are 0/4 at the bilateral biceps, triceps, brachioradialis, patella and achilles. Plantar responses are downgoing bilaterally.  Movement examination: Tone: There is normal tone in the bilateral upper extremities.  The tone in the lower extremities is normal.  Abnormal movements: A minor tremor could be felt in the right thumb with distraction, but otherwise no tremor noted. Coordination:  There is no significant decremation with RAM's, with any form of rapid alternating movements, including alternating supination and pronation of the forearm, hand opening and closing, finger taps, heel taps and toe taps. Gait and Station: The patient has no significant difficulty arising out of a deep-seated chair without the use of the hands. The patient's stride length is decreased and he does shuffle.   ASSESSMENT/PLAN:  1.  Parkinsonism  -Long discussion with the patient and his wife regarding the fact that I do not think he has idiopathic Parkinson's disease.  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  His MRI shows advanced small vessel disease with multiple old lacunar infarctions.  I explained to them that only 30% of patients who have vascular parkinsonism will respond to levodopa, but they wished to go ahead and try.  I told his wife to keep a close check on his blood pressure, since levodopa can  exacerbate orthostatic hypotension.  We will cautiously and slowly start the medication.  Risks, benefits, side effects and alternative therapies were discussed.  The  opportunity to ask questions was given and they were answered to the best of my ability.  The patient expressed understanding and willingness to follow the outlined treatment protocols.  -I did discuss with them that amiodarone can cause parkinsonism as well.  However, I do not think that a dat scan would be particularly useful in his case since I really do not think that he has presynaptic, or idiopathic, Parkinson's disease.  -I do not see evidence of one of the atypical states such as MSA or PSP. 2.  Dysarthria  -Based on his history of this starting acutely and being told that he had a stroke, and evidence of multiple lacunes on MRI, I suspect that perhaps this is, in fact, from an old infarction.   3.  Sialorrhea  -Could be the result of prior infarct or from vascular parkinsonism.  Talked about treatment options.  He is not a candidate for anticholinergic medications, given gait instability and also memory change (MoCA done on 11/02/14 was 19/30)   -He and his wife would like to see if he would be approved for Myobloc.  Talked extensively about risks/benefits. 4.  Syncope  -Multiple episodes over many years.  Talked to the patient about the fact that I do not think that this is from vascular parkinsonism. Dr. Delice Lesch has just completed an EEG.  I discussed with her perhaps also doing an ambulatory EEG, as not all of the events sounded quite like syncope (staring spell during church).    -If the above evaluation is not helpful, then perhaps a consultation with an autonomic specialist would be of value, but there are none in [place] that I know of. 5.  Much greater than 50% of 70 min visit in counseling discussing above, as well as safety, and coordinating care.   Start time: 1:59 pm, End time:  3:10 pm

## 2014-11-16 NOTE — Progress Notes (Signed)
Note routed to Dr [name].

## 2014-12-01 ENCOUNTER — Telehealth: Payer: Self-pay | Admitting: Neurology

## 2014-12-01 NOTE — Telephone Encounter (Signed)
Spoke with wife. She will back down his medication and let us know how he does.

## 2014-12-01 NOTE — Telephone Encounter (Signed)
That doesn't make a lot of sense to me but can try to back down

## 2014-12-01 NOTE — Telephone Encounter (Signed)
Pt wife's called in regards to a medication he was taking would like a call back at [phone]

## 2014-12-01 NOTE — Telephone Encounter (Signed)
Spoke with patient's wife and she states patient did well on Carbidopa Levodopa 25/100 1/2 tablet TID. States he was more alert, speech was more clear and getting around better. She states since he has been on a full tablet TID that patient is more agitated and can't sit still. He is also not moving as well. Please advise.

## 2014-12-06 ENCOUNTER — Telehealth: Payer: Self-pay | Admitting: Neurology

## 2014-12-06 NOTE — Telephone Encounter (Signed)
Received call from ED doc at [place].  Pt did well with carbidopa/levodopa 25/100, 1/2 po tid but 3 days ago went up to 1po tid and felt like more unstable.  Not orthostatic in ED.  Labs/exam negative and everything she saw "looked chronic." Told her that would be unusual but to go back to 1/2 po tid and have him call office tomorrow

## 2014-12-08 ENCOUNTER — Telehealth: Payer: Self-pay | Admitting: Neurology

## 2014-12-08 NOTE — Telephone Encounter (Signed)
Spoke with patient's wife and she states patient had gone down to 1/2 tablet TID of Levodopa this weekend and symptoms got worse. He was not moving well on his own and his hands and feet "weren't working". She states since stopping Levodopa on Tuesday patient is much better. He is up and moving on his own. I advise them to stay off medication and I would make Dr Tat aware and call back with any additional instructions.

## 2014-12-08 NOTE — Telephone Encounter (Signed)
That is fine since he doesn't have idiopathic PD anyway.

## 2014-12-08 NOTE — Telephone Encounter (Signed)
pts wife [place] called and stated Mr Glazier was in the ER and they instructed he stop taking the Carbidopa, would like a call back at [phone]

## 2014-12-16 ENCOUNTER — Ambulatory Visit: Payer: Medicare Other | Admitting: Neurology

## 2014-12-19 NOTE — Telephone Encounter (Signed)
Will sign off--

## 2015-01-17 ENCOUNTER — Ambulatory Visit: Payer: Medicare Other | Admitting: Neurology

## 2015-06-14 ENCOUNTER — Ambulatory Visit (INDEPENDENT_AMBULATORY_CARE_PROVIDER_SITE_OTHER): Payer: Medicare Other | Admitting: Adult Health

## 2015-06-14 ENCOUNTER — Ambulatory Visit: Payer: Medicare Other | Admitting: Pulmonary Disease

## 2015-06-14 ENCOUNTER — Encounter: Payer: Self-pay | Admitting: Adult Health

## 2015-06-14 VITALS — BP 122/88 | HR 72 | Temp 97.8°F | Ht 72.0 in | Wt 161.0 lb

## 2015-06-14 DIAGNOSIS — G4733 Obstructive sleep apnea (adult) (pediatric): Secondary | ICD-10-CM

## 2015-06-14 NOTE — Progress Notes (Signed)
Subjective:    Patient ID: Joe Glover, male    DOB: [birth year], 81 y.o.   MRN: [id number]  HPI 81 yo male with OSA   TEST  NPSG 2011 >AHI 8/hr   06/14/2015 Follow up : OSA  Patient presents for a one-year follow-up for mild sleep apnea.  Pt uses CPAP every night. Mask fits fine. He feels rested with no significant daytime sleepiness. Remains on AutoSet. Download over 30 days. Shows excellent compliance with average usage at around 8 hours. AHI at 2. + leaks. Recently got a new C Pap machine but does not like it, as well as his old machine.. We discussed having machine checked by his home care company.. She denies any chest pain, orthopnea, PND, or increased leg swelling.  Past Medical History  Diagnosis Date  . TIA (transient ischemic attack)   . Hyperlipidemia   . Stroke ([place])   . Hypertension   . Colon polyp   . OSA (obstructive sleep apnea)   . GI bleed   . Vitamin D deficiency   . Parkinson disease ([place])   . PAF (paroxysmal atrial fibrillation) ([place])   . Prostate cancer ([place])   . [facility])    Current Outpatient Prescriptions on File Prior to Visit  Medication Sig Dispense Refill  . amiodarone (PACERONE) 200 MG tablet 1/2 tablet every morning  0  . Azelastine HCl 0.15 % SOLN Place 2 sprays into the nose 2 (two) times daily.    . Cholecalciferol (VITAMIN D) 2000 UNITS CAPS Take by mouth daily. Takes 2 capsules daily    . cloNIDine (CATAPRES) 0.1 MG tablet 1/2 tablet every evening  0  . Cyanocobalamin (VITAMIN B-12 IJ) Inject as directed. Once a month    . loratadine (CLARITIN) 10 MG tablet Take 10 mg by mouth daily.    . magnesium oxide (MAG-OX) 400 MG tablet Takes  Tablets twice daily    . omeprazole (PRILOSEC) 20 MG capsule 1 tab twice daily  1  . OVER THE COUNTER MEDICATION Take 27 mg by mouth daily. High Potency Iron takes 2 times daily    . potassium chloride (K-DUR,KLOR-CON) 10 MEQ tablet Take 10 mEq by mouth daily. Take 1 tablet daily  0  .  pravastatin (PRAVACHOL) 40 MG tablet Once daily  1  . psyllium (METAMUCIL) 58.6 % packet Take 1 packet by mouth daily.    . ranitidine (ZANTAC) 300 MG tablet Take 300 mg by mouth daily.  1  . temazepam (RESTORIL) 15 MG capsule 15 mg at bedtime.   0  . carbidopa-levodopa (SINEMET IR) 25-100 MG per tablet Take 1 tablet by mouth 3 (three) times daily. (Patient not taking: Reported on 06/14/2015) 90 tablet 2   No current facility-administered medications on file prior to visit.     Review of Systems Constitutional:   No  weight loss, night sweats,  Fevers, chills, fatigue, or  lassitude.  HEENT:   No headaches,  Difficulty swallowing,  Tooth/dental problems, or  Sore throat,                No sneezing, itching, ear ache, nasal congestion, post nasal drip,   CV:  No chest pain,  Orthopnea, PND, swelling in lower extremities, anasarca, dizziness, palpitations, syncope.   GI  No heartburn, indigestion, abdominal pain, nausea, vomiting, diarrhea, change in bowel habits, loss of appetite, bloody stools.   Resp: No shortness of breath with exertion or at rest.  No excess mucus, no productive cough,  No non-productive cough,  No coughing up of blood.  No change in color of mucus.  No wheezing.  No chest wall deformity  Skin: no rash or lesions.  GU: no dysuria, change in color of urine, no urgency or frequency.  No flank pain, no hematuria   MS:  No joint pain or swelling.  No decreased range of motion.  No back pain.  Psych:  No change in mood or affect. No depression or anxiety.  No memory loss.         Objective:   Physical Exam  GEN: A/Ox3; pleasant , NAD, frail and elderly   HEENT:  [place]/AT,  EACs-clear, TMs-wnl, NOSE-clear, THROAT-clear, no lesions, no postnasal drip or exudate noted. Poor dentition  NECK:  Supple w/ fair ROM; no JVD; normal carotid impulses w/o bruits; no thyromegaly or nodules palpated; no lymphadenopathy.  RESP  Clear  P & A; w/o, wheezes/ rales/ or rhonchi.no  accessory muscle use, no dullness to percussion  CARD:  RRR, no m/r/g  , no peripheral edema, pulses intact, no cyanosis or clubbing.  GI:   Soft & nt; nml bowel sounds; no organomegaly or masses detected.  Musco: Warm bil, no deformities or joint swelling noted.   Neuro: alert, no focal deficits noted.    Skin: Warm, no lesions or rashes        Assessment & Plan:

## 2015-06-14 NOTE — Progress Notes (Signed)
Reviewed & agree with plan  

## 2015-06-14 NOTE — Patient Instructions (Signed)
Continue on C Pap at bedtime Keep up the good work Order sent to your home care copy to evaluate her new machine and reprogram your card  Do not drive a sleepy Follow-up in one year with Dr. Elsworth Soho and as needed

## 2015-06-14 NOTE — Assessment & Plan Note (Signed)
Doing well on nocturnal C Pap order to have new C Pap machine evaluated.  Plan  Continue on C Pap at bedtime Keep up the good work Order sent to your home care copy to evaluate her new machine and reprogram your card  Do not drive a sleepy Follow-up in one year with Dr. Elsworth Soho and as needed

## 2015-06-21 ENCOUNTER — Encounter: Payer: Self-pay | Admitting: Adult Health

## 2015-06-23 ENCOUNTER — Telehealth: Payer: Self-pay | Admitting: Adult Health

## 2015-06-23 DIAGNOSIS — G4733 Obstructive sleep apnea (adult) (pediatric): Secondary | ICD-10-CM

## 2015-06-23 NOTE — Telephone Encounter (Signed)
Order was never placed. I have now placed order. Pt spouse is aware. Nothing further needed

## 2015-07-11 DIAGNOSIS — I6012 Nontraumatic subarachnoid hemorrhage from left middle cerebral artery: Secondary | ICD-10-CM

## 2015-07-11 DIAGNOSIS — Z95818 Presence of other cardiac implants and grafts: Secondary | ICD-10-CM

## 2015-07-11 HISTORY — DX: Presence of other cardiac implants and grafts: Z95.818

## 2015-07-11 HISTORY — DX: Nontraumatic subarachnoid hemorrhage from left middle cerebral artery: I60.12

## 2015-08-06 DIAGNOSIS — I480 Paroxysmal atrial fibrillation: Secondary | ICD-10-CM | POA: Insufficient documentation

## 2015-08-06 DIAGNOSIS — Z79899 Other long term (current) drug therapy: Secondary | ICD-10-CM

## 2015-08-06 DIAGNOSIS — I951 Orthostatic hypotension: Secondary | ICD-10-CM

## 2015-08-06 HISTORY — DX: Other long term (current) drug therapy: Z79.899

## 2015-08-06 HISTORY — DX: Orthostatic hypotension: I95.1

## 2016-01-19 DIAGNOSIS — K219 Gastro-esophageal reflux disease without esophagitis: Secondary | ICD-10-CM

## 2016-01-19 DIAGNOSIS — K5731 Diverticulosis of large intestine without perforation or abscess with bleeding: Secondary | ICD-10-CM

## 2016-01-19 DIAGNOSIS — I10 Essential (primary) hypertension: Secondary | ICD-10-CM | POA: Diagnosis not present

## 2016-01-19 DIAGNOSIS — I480 Paroxysmal atrial fibrillation: Secondary | ICD-10-CM | POA: Diagnosis not present

## 2016-01-19 DIAGNOSIS — D62 Acute posthemorrhagic anemia: Secondary | ICD-10-CM | POA: Diagnosis not present

## 2016-01-20 DIAGNOSIS — D62 Acute posthemorrhagic anemia: Secondary | ICD-10-CM | POA: Diagnosis not present

## 2016-01-20 DIAGNOSIS — I480 Paroxysmal atrial fibrillation: Secondary | ICD-10-CM | POA: Diagnosis not present

## 2016-01-20 DIAGNOSIS — I10 Essential (primary) hypertension: Secondary | ICD-10-CM | POA: Diagnosis not present

## 2016-01-20 DIAGNOSIS — K5731 Diverticulosis of large intestine without perforation or abscess with bleeding: Secondary | ICD-10-CM | POA: Diagnosis not present

## 2016-01-21 DIAGNOSIS — I10 Essential (primary) hypertension: Secondary | ICD-10-CM | POA: Diagnosis not present

## 2016-01-21 DIAGNOSIS — D62 Acute posthemorrhagic anemia: Secondary | ICD-10-CM | POA: Diagnosis not present

## 2016-01-21 DIAGNOSIS — I480 Paroxysmal atrial fibrillation: Secondary | ICD-10-CM | POA: Diagnosis not present

## 2016-01-21 DIAGNOSIS — K5731 Diverticulosis of large intestine without perforation or abscess with bleeding: Secondary | ICD-10-CM | POA: Diagnosis not present

## 2016-01-22 DIAGNOSIS — I480 Paroxysmal atrial fibrillation: Secondary | ICD-10-CM | POA: Diagnosis not present

## 2016-01-22 DIAGNOSIS — I10 Essential (primary) hypertension: Secondary | ICD-10-CM | POA: Diagnosis not present

## 2016-01-22 DIAGNOSIS — D62 Acute posthemorrhagic anemia: Secondary | ICD-10-CM | POA: Diagnosis not present

## 2016-01-22 DIAGNOSIS — K5731 Diverticulosis of large intestine without perforation or abscess with bleeding: Secondary | ICD-10-CM | POA: Diagnosis not present

## 2016-01-23 DIAGNOSIS — I10 Essential (primary) hypertension: Secondary | ICD-10-CM | POA: Diagnosis not present

## 2016-01-23 DIAGNOSIS — D62 Acute posthemorrhagic anemia: Secondary | ICD-10-CM | POA: Diagnosis not present

## 2016-01-23 DIAGNOSIS — I480 Paroxysmal atrial fibrillation: Secondary | ICD-10-CM | POA: Diagnosis not present

## 2016-01-23 DIAGNOSIS — K5731 Diverticulosis of large intestine without perforation or abscess with bleeding: Secondary | ICD-10-CM | POA: Diagnosis not present

## 2016-06-25 ENCOUNTER — Ambulatory Visit: Payer: Medicare Other | Admitting: Pulmonary Disease

## 2016-08-11 ENCOUNTER — Encounter: Payer: Self-pay | Admitting: Pulmonary Disease

## 2016-08-13 ENCOUNTER — Encounter: Payer: Self-pay | Admitting: Pulmonary Disease

## 2016-08-13 ENCOUNTER — Ambulatory Visit (INDEPENDENT_AMBULATORY_CARE_PROVIDER_SITE_OTHER): Payer: Medicare Other | Admitting: Pulmonary Disease

## 2016-08-13 DIAGNOSIS — F5101 Primary insomnia: Secondary | ICD-10-CM

## 2016-08-13 DIAGNOSIS — G4700 Insomnia, unspecified: Secondary | ICD-10-CM | POA: Insufficient documentation

## 2016-08-13 DIAGNOSIS — G4733 Obstructive sleep apnea (adult) (pediatric): Secondary | ICD-10-CM | POA: Diagnosis not present

## 2016-08-13 HISTORY — DX: Insomnia, unspecified: G47.00

## 2016-08-13 NOTE — Progress Notes (Signed)
   Subjective:    Patient ID: Joe Glover, male    DOB: 11-10-1924, 83 y.o.   MRN: [id number]  HPI  83 yo male for FU of  OSA  He is maintained on auto CPAP settings. He received a new machine 2 years ago  Blood pressures were controlled with clonidine. Wife reports excessive daytime drowsiness and frequent naps. He is well compliant with his CPAP machine and download confirms excellent control of events on auto settings 5-20 cm with average pressure of 14 cm with no residual events and large leak.Wife does not sleep in the same room due to the noise made with the machine.   Not clear if he has Parkinson's -he was on Sinemet but wife states he is not taking this anymore. He ambulates with a walker or cane  He has been on temazepam for many years For insomnia, decreased from 30-15 mg and is interested in completely getting      Significant tests/ events  NPSG 2011 >AHI 8/hr   Review of Systems Patient denies significant dyspnea,cough, hemoptysis,  chest pain, palpitations, pedal edema, orthopnea, paroxysmal nocturnal dyspnea, lightheadedness, nausea, vomiting, abdominal or  leg pains      Objective:   Physical Exam  Gen. Pleasant, well-nourished, in no distress ENT - no lesions, no post nasal drip Neck: No JVD, no thyromegaly, no carotid bruits Lungs: no use of accessory muscles, no dullness to percussion, clear without rales or rhonchi  Cardiovascular: Rhythm regular, heart sounds  normal, no murmurs or gallops, no peripheral edema Musculoskeletal: No deformities, no cyanosis or clubbing        Assessment & Plan:

## 2016-08-13 NOTE — Patient Instructions (Signed)
Changes CPAP settings to auto 5-15 cm Trial of using melatonin instead of temazepam- try to gradually decrease temazepam-started first on Monday Wednesday Friday

## 2016-08-13 NOTE — Addendum Note (Signed)
Addended by: Valerie Salts on: 08/13/2016 02:30 PM   Modules accepted: Orders

## 2016-08-13 NOTE — Assessment & Plan Note (Signed)
Prefer to be off benzos at his age Discussed withdrawal effects since his been on this for a long time  Trial of using melatonin instead of temazepam- try to gradually decrease temazepam-started first on Monday Wednesday Friday

## 2016-08-13 NOTE — Assessment & Plan Note (Signed)
Changes CPAP settings to auto 5-15 cm Asked him to get a better seal to decrease leak   Weight loss encouraged, compliance with goal of at least 4-6 hrs every night is the expectation. Advised against medications with sedative side effects Cautioned against driving when sleepy - understanding that sleepiness will vary on a day to day basis

## 2016-09-05 DIAGNOSIS — S065XAA Traumatic subdural hemorrhage with loss of consciousness status unknown, initial encounter: Secondary | ICD-10-CM | POA: Insufficient documentation

## 2016-09-05 DIAGNOSIS — S065X9A Traumatic subdural hemorrhage with loss of consciousness of unspecified duration, initial encounter: Secondary | ICD-10-CM

## 2016-09-05 HISTORY — DX: Traumatic subdural hemorrhage with loss of consciousness status unknown, initial encounter: S06.5XAA

## 2016-09-05 HISTORY — DX: Traumatic subdural hemorrhage with loss of consciousness of unspecified duration, initial encounter: S06.5X9A

## 2016-09-16 DIAGNOSIS — W19XXXA Unspecified fall, initial encounter: Secondary | ICD-10-CM | POA: Insufficient documentation

## 2016-09-16 DIAGNOSIS — S065XAA Traumatic subdural hemorrhage with loss of consciousness status unknown, initial encounter: Secondary | ICD-10-CM

## 2016-09-16 DIAGNOSIS — S065X9A Traumatic subdural hemorrhage with loss of consciousness of unspecified duration, initial encounter: Secondary | ICD-10-CM

## 2016-09-16 HISTORY — DX: Traumatic subdural hemorrhage with loss of consciousness status unknown, initial encounter: S06.5XAA

## 2016-09-16 HISTORY — DX: Unspecified fall, initial encounter: W19.XXXA

## 2016-09-16 HISTORY — DX: Traumatic subdural hemorrhage with loss of consciousness of unspecified duration, initial encounter: S06.5X9A

## 2017-01-19 DIAGNOSIS — R531 Weakness: Secondary | ICD-10-CM

## 2017-01-19 DIAGNOSIS — I480 Paroxysmal atrial fibrillation: Secondary | ICD-10-CM | POA: Diagnosis not present

## 2017-01-19 DIAGNOSIS — R5381 Other malaise: Secondary | ICD-10-CM | POA: Diagnosis not present

## 2017-01-19 DIAGNOSIS — R4781 Slurred speech: Secondary | ICD-10-CM

## 2017-01-19 DIAGNOSIS — E871 Hypo-osmolality and hyponatremia: Secondary | ICD-10-CM

## 2017-01-19 DIAGNOSIS — R1310 Dysphagia, unspecified: Secondary | ICD-10-CM

## 2017-01-20 DIAGNOSIS — R1310 Dysphagia, unspecified: Secondary | ICD-10-CM | POA: Diagnosis not present

## 2017-01-20 DIAGNOSIS — I480 Paroxysmal atrial fibrillation: Secondary | ICD-10-CM | POA: Diagnosis not present

## 2017-01-20 DIAGNOSIS — R531 Weakness: Secondary | ICD-10-CM | POA: Diagnosis not present

## 2017-01-20 DIAGNOSIS — R4781 Slurred speech: Secondary | ICD-10-CM | POA: Diagnosis not present

## 2017-01-20 DIAGNOSIS — E871 Hypo-osmolality and hyponatremia: Secondary | ICD-10-CM | POA: Diagnosis not present

## 2017-01-20 DIAGNOSIS — R5381 Other malaise: Secondary | ICD-10-CM | POA: Diagnosis not present

## 2017-01-21 DIAGNOSIS — R1310 Dysphagia, unspecified: Secondary | ICD-10-CM | POA: Diagnosis not present

## 2017-01-21 DIAGNOSIS — R5381 Other malaise: Secondary | ICD-10-CM | POA: Diagnosis not present

## 2017-01-21 DIAGNOSIS — R531 Weakness: Secondary | ICD-10-CM | POA: Diagnosis not present

## 2017-01-21 DIAGNOSIS — E871 Hypo-osmolality and hyponatremia: Secondary | ICD-10-CM | POA: Diagnosis not present

## 2017-01-21 DIAGNOSIS — I480 Paroxysmal atrial fibrillation: Secondary | ICD-10-CM | POA: Diagnosis not present

## 2017-01-21 DIAGNOSIS — R4781 Slurred speech: Secondary | ICD-10-CM | POA: Diagnosis not present

## 2017-01-26 ENCOUNTER — Inpatient Hospital Stay (HOSPITAL_COMMUNITY)
Admission: EM | Admit: 2017-01-26 | Discharge: 2017-01-28 | DRG: 603 | Disposition: A | Discharge status: Home / Self Care | Payer: Medicare Other | Attending: Internal Medicine | Admitting: Internal Medicine

## 2017-01-26 ENCOUNTER — Encounter (HOSPITAL_COMMUNITY): Payer: Self-pay | Admitting: Emergency Medicine

## 2017-01-26 DIAGNOSIS — E785 Hyperlipidemia, unspecified: Secondary | ICD-10-CM | POA: Diagnosis present

## 2017-01-26 DIAGNOSIS — Z8546 Personal history of malignant neoplasm of prostate: Secondary | ICD-10-CM

## 2017-01-26 DIAGNOSIS — E871 Hypo-osmolality and hyponatremia: Secondary | ICD-10-CM | POA: Diagnosis present

## 2017-01-26 DIAGNOSIS — I10 Essential (primary) hypertension: Secondary | ICD-10-CM | POA: Diagnosis present

## 2017-01-26 DIAGNOSIS — Z87891 Personal history of nicotine dependence: Secondary | ICD-10-CM

## 2017-01-26 DIAGNOSIS — Z8673 Personal history of transient ischemic attack (TIA), and cerebral infarction without residual deficits: Secondary | ICD-10-CM

## 2017-01-26 DIAGNOSIS — D649 Anemia, unspecified: Secondary | ICD-10-CM | POA: Diagnosis present

## 2017-01-26 DIAGNOSIS — I959 Hypotension, unspecified: Secondary | ICD-10-CM

## 2017-01-26 DIAGNOSIS — G20 Parkinson's disease: Secondary | ICD-10-CM | POA: Diagnosis present

## 2017-01-26 DIAGNOSIS — R55 Syncope and collapse: Secondary | ICD-10-CM | POA: Diagnosis present

## 2017-01-26 DIAGNOSIS — E876 Hypokalemia: Secondary | ICD-10-CM | POA: Diagnosis present

## 2017-01-26 DIAGNOSIS — L03113 Cellulitis of right upper limb: Secondary | ICD-10-CM | POA: Diagnosis not present

## 2017-01-26 DIAGNOSIS — G4733 Obstructive sleep apnea (adult) (pediatric): Secondary | ICD-10-CM | POA: Diagnosis present

## 2017-01-26 DIAGNOSIS — Z888 Allergy status to other drugs, medicaments and biological substances status: Secondary | ICD-10-CM

## 2017-01-26 DIAGNOSIS — E861 Hypovolemia: Secondary | ICD-10-CM | POA: Diagnosis present

## 2017-01-26 DIAGNOSIS — Z880 Allergy status to penicillin: Secondary | ICD-10-CM

## 2017-01-26 DIAGNOSIS — I444 Left anterior fascicular block: Secondary | ICD-10-CM | POA: Diagnosis present

## 2017-01-26 DIAGNOSIS — Z79899 Other long term (current) drug therapy: Secondary | ICD-10-CM

## 2017-01-26 DIAGNOSIS — E559 Vitamin D deficiency, unspecified: Secondary | ICD-10-CM | POA: Diagnosis present

## 2017-01-26 DIAGNOSIS — Z85038 Personal history of other malignant neoplasm of large intestine: Secondary | ICD-10-CM

## 2017-01-26 DIAGNOSIS — Z86010 Personal history of colonic polyps: Secondary | ICD-10-CM

## 2017-01-26 DIAGNOSIS — Z8489 Family history of other specified conditions: Secondary | ICD-10-CM

## 2017-01-26 LAB — BASIC METABOLIC PANEL
Anion gap: 6 (ref 5–15)
BUN: 11 mg/dL (ref 6–20)
CALCIUM: 7.9 mg/dL — AB (ref 8.9–10.3)
CO2: 25 mmol/L (ref 22–32)
Chloride: 94 mmol/L — ABNORMAL LOW (ref 101–111)
Creatinine, Ser: 0.94 mg/dL (ref 0.61–1.24)
GFR calc Af Amer: >60 mL/min (ref >60)
GLUCOSE: 138 mg/dL — AB (ref 65–99)
POTASSIUM: 3.3 mmol/L — AB (ref 3.5–5.1)
SODIUM: 125 mmol/L — AB (ref 135–145)

## 2017-01-26 LAB — CBC WITH DIFFERENTIAL/PLATELET
Basophils Absolute: 0 10*3/uL (ref 0.0–0.1)
Basophils Relative: 0 %
EOS ABS: 0.1 10*3/uL (ref 0.0–0.7)
EOS PCT: 1 %
HCT: 34.1 % — ABNORMAL LOW (ref 39.0–52.0)
HEMOGLOBIN: 11.6 g/dL — AB (ref 13.0–17.0)
LYMPHS ABS: 0.3 10*3/uL — AB (ref 0.7–4.0)
Lymphocytes Relative: 3 %
MCH: 27 pg (ref 26.0–34.0)
MCHC: 34 g/dL (ref 30.0–36.0)
MCV: 79.3 fL (ref 78.0–100.0)
MONO ABS: 0.4 10*3/uL (ref 0.1–1.0)
MONOS PCT: 5 %
NEUTROS PCT: 91 %
Neutro Abs: 7 10*3/uL (ref 1.7–7.7)
PLATELETS: 207 10*3/uL (ref 150–400)
RBC: 4.3 MIL/uL (ref 4.22–5.81)
RDW: 14.4 % (ref 11.5–15.5)
WBC: 7.7 10*3/uL (ref 4.0–10.5)

## 2017-01-26 LAB — I-STAT TROPONIN, ED: TROPONIN I, POC: 0 ng/mL (ref 0.00–0.08)

## 2017-01-26 LAB — CBG MONITORING, ED: GLUCOSE-CAPILLARY: 133 mg/dL — AB (ref 65–99)

## 2017-01-26 NOTE — ED Provider Notes (Signed)
TIME SEEN: 11:49 PM  By signing my name below, I, Ny'Kea Lewis, attest that this documentation has been prepared under the direction and in the presence of Wacey Zieger, Delice Bison, DO. Electronically Signed: Lise Auer, ED Scribe. 01/27/17. 12:16 AM.  CHIEF COMPLAINT: Syncope  HPI:  Joe Glover is a 83 y.o. male with a PMHx of HLD, HTN, Parkinson Disease, colon cancer, prostate cancer, CVA, and TIA,  brought in by ambulance, who presents to the Emergency Department complaining of syncope tx 2 today. He notes associated fever (101.2), speech difficulty and trouble swallowing. Per pt's wife, today while at Urgent Care for right hand pain and swelling,  He had a syncopal episode while sitting on the examining table. Once he awoke she states he "was not himself". In the urgent care he had a fever of 101.2. He was taken to [facility] ED, were he received IV fluids and antibiotics for right hand cellulitis and he was discharged on Bactrim. While at home he had another episode that lasted for an unknown amount of time, and EMS was called. His wife reports checking his BP which was 70/50. No recent cough, vomiting or diarrhea. No complaints of chest pain or shortness of breath. Patient has no complaints at this time.  Patient has had a recent complex medical history. On 7/12, he fell and hit his head on the wheel of their car and he went to the emergency department. Had a negative head CT. She states that his speech was "off" at that time. She states that his speech started to get worse on July 15, she reports he was admitted to the hospital and diagnosed with hyponatremia. At that time he had negative head CT and MRI of the brain. MRI did show microvascular ischemic changes and 2 small fusiform aneurysms but no acute infarct. He was discharged on 01/21/17.  On 7/18, he "passed out" for fifteen minutes, she denies seizure-like acitivities, but reports he did not awake until he was with EMS. No post ictal. Her  tongue biting. He went back to the emergency department at that time and had a CT of his chest that ruled out pulmonary embolus. He was discharged home.  ROS: See HPI Constitutional: + fever  Eyes: no drainage  ENT: no runny nose , + trouble swallowing that is chronic from previous stroke Cardiovascular:  no chest pain  Resp: no SOB  GI: no vomiting, emesis, diarrhea  GU: no dysuria Integumentary: no rash  Allergy: no hives  Musculoskeletal: no leg swelling  Neurological: + slurred speech ROS otherwise negative  PAST MEDICAL HISTORY/PAST SURGICAL HISTORY:  Past Medical History:  Diagnosis Date  . Colon cancer ([place])   . Colon polyp   . GI bleed   . Hyperlipidemia   . Hypertension   . OSA (obstructive sleep apnea)   . PAF (paroxysmal atrial fibrillation) ([place])   . Parkinson disease ([place])   . Prostate cancer ([place])   . Stroke ([place])   . TIA (transient ischemic attack)   . Vitamin D deficiency     MEDICATIONS:  Prior to Admission medications   Medication Sig Start Date End Date Taking? Authorizing Provider  Azelastine HCl 0.15 % SOLN Place 2 sprays into the nose 2 (two) times daily.    [provider]  carbidopa-levodopa (SINEMET IR) 25-100 MG per tablet Take 1 tablet by mouth 3 (three) times daily. 11/15/14   TatEustace Quail, DO  Cholecalciferol (VITAMIN D) 2000 UNITS CAPS Take by mouth daily. Takes  2 capsules daily    [provider]  cloNIDine (CATAPRES) 0.1 MG tablet  05/16/14   [provider]  Cyanocobalamin (VITAMIN B-12 IJ) Inject as directed. Once a month    [provider]  loratadine (CLARITIN) 10 MG tablet Take 10 mg by mouth daily.    [provider]  magnesium oxide (MAG-OX) 400 MG tablet Takes  Tablets twice daily    [provider]  omeprazole (PRILOSEC) 20 MG capsule 1 tab twice daily 04/26/14   [provider]  OVER THE COUNTER MEDICATION Take 27 mg by mouth daily. High Potency Iron takes 2 times daily     [provider]  potassium chloride (K-DUR,KLOR-CON) 10 MEQ tablet Take 10 mEq by mouth daily. Take 1 tablet daily 10/20/14   [provider]  pravastatin (PRAVACHOL) 40 MG tablet Once daily 05/07/14   [provider]  psyllium (METAMUCIL) 58.6 % packet Take 1 packet by mouth 2 (two) times daily.     [provider]  ranitidine (ZANTAC) 300 MG tablet Take 300 mg by mouth daily. 04/26/14   [provider]  temazepam (RESTORIL) 15 MG capsule 15 mg at bedtime.  04/26/14   [provider]    ALLERGIES:  Allergies  Allergen Reactions  . Anacin [Aspirin]     GI bleed  . Augmentin [Amoxicillin-Pot Clavulanate]     hives  . Hemocyte Plus [Hematinic Plus Vit-Minerals]   . Penicillins     hives    SOCIAL HISTORY:  Social History  Substance Use Topics  . Smoking status: Former Smoker    Packs/day: 2.00    Years: 20.00    Types: Cigarettes    Quit date: 07/08/1958  . Smokeless tobacco: Never Used  . Alcohol use 0.0 oz/week     Comment: occasional - 1-2 drinks a month    FAMILY HISTORY: Family History  Problem Relation Age of Onset  . Parkinson's disease Mother   . Heart attack Father     EXAM: BP 114/68 (BP Location: Right Arm)   Pulse 98   Temp 98.3 F (36.8 C) (Oral)   Resp 16   Ht 6' (1.829 m)   Wt 165 lb (74.8 kg)   SpO2 98%   BMI 22.38 kg/m  CONSTITUTIONAL: Alert and oriented, Elderly, chronically ill-appearing, in no distress HEAD: Normocephalic EYES: Conjunctivae clear, pupils appear equal, EOMI ENT: normal nose; moist mucous membranes NECK: Supple, no meningismus, no nuchal rigidity, no LAD  CARD: RRR; S1 and S2 appreciated; no murmurs, no clicks, no rubs, no gallops RESP: Normal chest excursion without splinting or tachypnea; breath sounds clear and equal bilaterally; no wheezes, no rhonchi, no rales, no hypoxia or respiratory distress, speaking full sentences ABD/GI: Normal bowel sounds; non-distended; soft,  non-tender, no rebound, no guarding, no peritoneal signs, no hepatosplenomegaly BACK:  The back appears normal and is non-tender to palpation, there is no CVA tenderness EXT: Right hand is warm, red, and swollen over the dorsal aspect. Compartment are soft. 2+ radial pulses bilaterally. No joint effusion. No bony deformity. Normal ROM in all joints; non-tender to palpation; no edema; normal capillary refill; no cyanosis, no calf tenderness or swelling    SKIN: Normal color for age and race; warm; no rash NEURO: Moves all extremities equally, No pronator drift, cranial nerves II through XII intact, no dysarthria or aphasia PSYCH: The patient's mood and manner are appropriate. Grooming and personal hygiene are appropriate.  MEDICAL DECISION MAKING: Patient here with fever of  101.2 urgent care today and in 2 syncopal events with hypotension at home with his wife that was confirmed with EMS. I'm concerned for possible sepsis. He does have a right hand cellulitis on exam. Will give IV fluids and broad-spectrum antibiotics. We'll obtain labs, cultures, urine, chest x-ray. I feel he will need admission. Will obtain outside hospital records.  ED PROGRESS: Received outside hospital records which confirm unremarkable brain MRI and CT of his chest. Labs here show mild hyponatremia with sodium of 125. No leukocytosis. Lactate normal. Urine shows no sign of infection. Chest x-ray shows possible atelectasis versus infiltrate in the left lung base. He has received broad-spectrum device. He remains hemodynamically stable. We'll discuss with medicine for admission.    1:21 AM Discussed patient's case with hospitalist, Dr. Myna Hidalgo.  I have recommended admission and patient (and family if present) agree with this plan. Admitting physician will place admission orders.   I reviewed all nursing notes, vitals, pertinent previous records, EKGs, lab and urine results, imaging (as available).   EKG  Interpretation  Date/Time:  Monday January 27 2017 01:27:01 EDT Ventricular Rate:  102 PR Interval:    QRS Duration: 118 QT Interval:  384 QTC Calculation: 501 R Axis:   -68 Text Interpretation:  Sinus tachycardia Left anterior fascicular block Probable left ventricular hypertrophy Anterior Q waves, possibly due to LVH Confirmed by Pryor Curia [phone]) on 01/27/2017 1:50:42 AM        I personally performed the services described in this documentation, which was scribed in my presence. The recorded information has been reviewed and is accurate.     Roshni Burbano, Delice Bison, DO 01/27/17 [id number]

## 2017-01-26 NOTE — ED Triage Notes (Signed)
EMS reports the pt has had 4 syncopal episodes in the last several days with the last one being today at 1300. EMS reports the pt was seen at [facility] for same today and given bactrim for cellulitis of his right hand, pt has a long standing history of same. EMS reports the pt was hypotensive for them on scene 100/60 laying and then 72/50 standing. Pt has no complaints at this time.

## 2017-01-27 ENCOUNTER — Observation Stay (HOSPITAL_BASED_OUTPATIENT_CLINIC_OR_DEPARTMENT_OTHER): Payer: Medicare Other

## 2017-01-27 ENCOUNTER — Encounter (HOSPITAL_COMMUNITY): Payer: Self-pay | Admitting: Family Medicine

## 2017-01-27 ENCOUNTER — Emergency Department (HOSPITAL_COMMUNITY): Payer: Medicare Other

## 2017-01-27 DIAGNOSIS — Z8673 Personal history of transient ischemic attack (TIA), and cerebral infarction without residual deficits: Secondary | ICD-10-CM | POA: Diagnosis not present

## 2017-01-27 DIAGNOSIS — L03113 Cellulitis of right upper limb: Secondary | ICD-10-CM | POA: Diagnosis not present

## 2017-01-27 DIAGNOSIS — Z86010 Personal history of colonic polyps: Secondary | ICD-10-CM | POA: Diagnosis not present

## 2017-01-27 DIAGNOSIS — Z87891 Personal history of nicotine dependence: Secondary | ICD-10-CM | POA: Diagnosis not present

## 2017-01-27 DIAGNOSIS — R55 Syncope and collapse: Secondary | ICD-10-CM

## 2017-01-27 DIAGNOSIS — G20 Parkinson's disease: Secondary | ICD-10-CM | POA: Diagnosis present

## 2017-01-27 DIAGNOSIS — E871 Hypo-osmolality and hyponatremia: Secondary | ICD-10-CM | POA: Diagnosis present

## 2017-01-27 DIAGNOSIS — D649 Anemia, unspecified: Secondary | ICD-10-CM | POA: Diagnosis present

## 2017-01-27 DIAGNOSIS — E785 Hyperlipidemia, unspecified: Secondary | ICD-10-CM | POA: Diagnosis present

## 2017-01-27 DIAGNOSIS — Z85038 Personal history of other malignant neoplasm of large intestine: Secondary | ICD-10-CM | POA: Diagnosis not present

## 2017-01-27 DIAGNOSIS — I959 Hypotension, unspecified: Secondary | ICD-10-CM | POA: Diagnosis not present

## 2017-01-27 DIAGNOSIS — Z79899 Other long term (current) drug therapy: Secondary | ICD-10-CM | POA: Diagnosis not present

## 2017-01-27 DIAGNOSIS — G4733 Obstructive sleep apnea (adult) (pediatric): Secondary | ICD-10-CM | POA: Diagnosis present

## 2017-01-27 DIAGNOSIS — E876 Hypokalemia: Secondary | ICD-10-CM | POA: Diagnosis present

## 2017-01-27 DIAGNOSIS — E559 Vitamin D deficiency, unspecified: Secondary | ICD-10-CM | POA: Diagnosis present

## 2017-01-27 DIAGNOSIS — Z8489 Family history of other specified conditions: Secondary | ICD-10-CM | POA: Diagnosis not present

## 2017-01-27 DIAGNOSIS — I10 Essential (primary) hypertension: Secondary | ICD-10-CM | POA: Diagnosis present

## 2017-01-27 DIAGNOSIS — E861 Hypovolemia: Secondary | ICD-10-CM | POA: Diagnosis present

## 2017-01-27 DIAGNOSIS — Z880 Allergy status to penicillin: Secondary | ICD-10-CM | POA: Diagnosis not present

## 2017-01-27 DIAGNOSIS — I444 Left anterior fascicular block: Secondary | ICD-10-CM | POA: Diagnosis present

## 2017-01-27 DIAGNOSIS — Z8546 Personal history of malignant neoplasm of prostate: Secondary | ICD-10-CM | POA: Diagnosis not present

## 2017-01-27 DIAGNOSIS — Z888 Allergy status to other drugs, medicaments and biological substances status: Secondary | ICD-10-CM | POA: Diagnosis not present

## 2017-01-27 HISTORY — DX: Anemia, unspecified: D64.9

## 2017-01-27 HISTORY — DX: Cellulitis of right upper limb: L03.113

## 2017-01-27 LAB — BASIC METABOLIC PANEL
ANION GAP: 7 (ref 5–15)
BUN: 10 mg/dL (ref 6–20)
CO2: 21 mmol/L — ABNORMAL LOW (ref 22–32)
CREATININE: 0.84 mg/dL (ref 0.61–1.24)
Calcium: 7.2 mg/dL — ABNORMAL LOW (ref 8.9–10.3)
Chloride: 103 mmol/L (ref 101–111)
Glucose, Bld: 110 mg/dL — ABNORMAL HIGH (ref 65–99)
Potassium: 3.4 mmol/L — ABNORMAL LOW (ref 3.5–5.1)
SODIUM: 131 mmol/L — AB (ref 135–145)

## 2017-01-27 LAB — URINALYSIS, ROUTINE W REFLEX MICROSCOPIC
BILIRUBIN URINE: NEGATIVE
GLUCOSE, UA: NEGATIVE mg/dL
HGB URINE DIPSTICK: NEGATIVE
KETONES UR: NEGATIVE mg/dL
Leukocytes, UA: NEGATIVE
Nitrite: NEGATIVE
PROTEIN: NEGATIVE mg/dL
Specific Gravity, Urine: 1.006 (ref 1.005–1.030)
pH: 6 (ref 5.0–8.0)

## 2017-01-27 LAB — ECHOCARDIOGRAM COMPLETE
CHL CUP MV DEC (S): 151
CHL CUP RV SYS PRESS: 20 mmHg
CHL CUP TV REG PEAK VELOCITY: 204 cm/s
E decel time: 151 msec
FS: 32 % (ref 28–44)
Height: 74 in
IV/PV OW: 1.22
LA vol: 54.1 mL
LADIAMINDEX: 2.13 cm/m2
LASIZE: 42 mm
LAVOLA4C: 46.5 mL
LAVOLIN: 27.5 mL/m2
LEFT ATRIUM END SYS DIAM: 42 mm
LV PW d: 11.8 mm — AB (ref 0.6–1.1)
LVOT area: 3.8 cm2
LVOT diameter: 22 mm
MV pk E vel: 60.1 m/s
MVPKAVEL: 87.3 m/s
P 1/2 time: 583 ms
TAPSE: 29.1 mm
TRMAXVEL: 204 cm/s
Weight: 2640 oz

## 2017-01-27 LAB — CBC WITH DIFFERENTIAL/PLATELET
BASOS ABS: 0 10*3/uL (ref 0.0–0.1)
BASOS PCT: 0 %
EOS ABS: 0.1 10*3/uL (ref 0.0–0.7)
Eosinophils Relative: 1 %
HCT: 32.7 % — ABNORMAL LOW (ref 39.0–52.0)
HEMOGLOBIN: 10.8 g/dL — AB (ref 13.0–17.0)
Lymphocytes Relative: 4 %
Lymphs Abs: 0.3 10*3/uL — ABNORMAL LOW (ref 0.7–4.0)
MCH: 26.1 pg (ref 26.0–34.0)
MCHC: 33 g/dL (ref 30.0–36.0)
MCV: 79 fL (ref 78.0–100.0)
MONOS PCT: 5 %
Monocytes Absolute: 0.3 10*3/uL (ref 0.1–1.0)
NEUTROS PCT: 90 %
Neutro Abs: 5.8 10*3/uL (ref 1.7–7.7)
Platelets: 209 10*3/uL (ref 150–400)
RBC: 4.14 MIL/uL — AB (ref 4.22–5.81)
RDW: 14.4 % (ref 11.5–15.5)
WBC: 6.4 10*3/uL (ref 4.0–10.5)

## 2017-01-27 LAB — GLUCOSE, CAPILLARY: Glucose-Capillary: 102 mg/dL — ABNORMAL HIGH (ref 65–99)

## 2017-01-27 LAB — MAGNESIUM: MAGNESIUM: 1.3 mg/dL — AB (ref 1.7–2.4)

## 2017-01-27 LAB — I-STAT CG4 LACTIC ACID, ED: LACTIC ACID, VENOUS: 1.05 mmol/L (ref 0.5–1.9)

## 2017-01-27 MED ORDER — ONDANSETRON HCL 4 MG PO TABS
4.0000 mg | ORAL_TABLET | Freq: Four times a day (QID) | ORAL | Status: DC | PRN
Start: 2017-01-27 — End: 2017-01-28

## 2017-01-27 MED ORDER — SODIUM CHLORIDE 0.9 % IV SOLN
INTRAVENOUS | Status: AC
  Administered 2017-01-27: 04:00:00 via INTRAVENOUS

## 2017-01-27 MED ORDER — ACETAMINOPHEN 650 MG RE SUPP: 650.0000 mg | Freq: Four times a day (QID) | RECTAL | Status: DC | PRN

## 2017-01-27 MED ORDER — TEMAZEPAM 15 MG PO CAPS
15.0000 mg | ORAL_CAPSULE | Freq: Every evening | ORAL | Status: DC | PRN
  Administered 2017-01-27: 15 mg via ORAL
  Filled 2017-01-27: qty 1

## 2017-01-27 MED ORDER — SODIUM CHLORIDE 0.9 % IV SOLN
INTRAVENOUS | Status: DC
  Administered 2017-01-27: 01:00:00 via INTRAVENOUS

## 2017-01-27 MED ORDER — MAGNESIUM OXIDE 400 (241.3 MG) MG PO TABS
400.0000 mg | ORAL_TABLET | Freq: Two times a day (BID) | ORAL | Status: DC
  Administered 2017-01-27 – 2017-01-28 (×3): 400 mg via ORAL
  Filled 2017-01-27 (×3): qty 1

## 2017-01-27 MED ORDER — SODIUM CHLORIDE 0.9 % IV BOLUS (SEPSIS)
1000.0000 mL | Freq: Once | INTRAVENOUS | Status: AC
  Administered 2017-01-27: 1000 mL via INTRAVENOUS

## 2017-01-27 MED ORDER — ONDANSETRON HCL 4 MG/2ML IJ SOLN
4.0000 mg | Freq: Four times a day (QID) | INTRAMUSCULAR | Status: DC | PRN
Start: 2017-01-27 — End: 2017-01-28

## 2017-01-27 MED ORDER — HYDROCODONE-ACETAMINOPHEN 5-325 MG PO TABS: 1.0000 | ORAL_TABLET | ORAL | Status: DC | PRN

## 2017-01-27 MED ORDER — SENNOSIDES-DOCUSATE SODIUM 8.6-50 MG PO TABS: 1.0000 | ORAL_TABLET | Freq: Every evening | ORAL | Status: DC | PRN

## 2017-01-27 MED ORDER — SODIUM CHLORIDE 0.9% FLUSH
3.0000 mL | Freq: Two times a day (BID) | INTRAVENOUS | Status: DC
  Administered 2017-01-27 – 2017-01-28 (×3): 3 mL via INTRAVENOUS

## 2017-01-27 MED ORDER — LORATADINE 10 MG PO TABS
10.0000 mg | ORAL_TABLET | Freq: Every day | ORAL | Status: DC
  Administered 2017-01-27 – 2017-01-28 (×2): 10 mg via ORAL
  Filled 2017-01-27 (×2): qty 1

## 2017-01-27 MED ORDER — DEXTROSE 5 % IV SOLN
1.0000 g | INTRAVENOUS | Status: DC
  Administered 2017-01-27 – 2017-01-28 (×2): 1 g via INTRAVENOUS
  Filled 2017-01-27 (×2): qty 10

## 2017-01-27 MED ORDER — HEPARIN SODIUM (PORCINE) 5000 UNIT/ML IJ SOLN
5000.0000 [IU] | Freq: Three times a day (TID) | INTRAMUSCULAR | Status: DC
  Administered 2017-01-27 – 2017-01-28 (×5): 5000 [IU] via SUBCUTANEOUS
  Filled 2017-01-27 (×5): qty 1

## 2017-01-27 MED ORDER — DEXTROSE 5 % IV SOLN
2.0000 g | Freq: Once | INTRAVENOUS | Status: AC
  Administered 2017-01-27: 2 g via INTRAVENOUS
  Filled 2017-01-27: qty 2

## 2017-01-27 MED ORDER — VITAMIN D 1000 UNITS PO TABS
4000.0000 [IU] | ORAL_TABLET | Freq: Every day | ORAL | Status: DC
  Administered 2017-01-27 – 2017-01-28 (×2): 4000 [IU] via ORAL
  Filled 2017-01-27 (×2): qty 4

## 2017-01-27 MED ORDER — VANCOMYCIN HCL IN DEXTROSE 1-5 GM/200ML-% IV SOLN
1000.0000 mg | Freq: Once | INTRAVENOUS | Status: AC
  Administered 2017-01-27: 1000 mg via INTRAVENOUS
  Filled 2017-01-27: qty 200

## 2017-01-27 MED ORDER — PANTOPRAZOLE SODIUM 40 MG PO TBEC
40.0000 mg | DELAYED_RELEASE_TABLET | Freq: Every day | ORAL | Status: DC
  Administered 2017-01-27 – 2017-01-28 (×2): 40 mg via ORAL
  Filled 2017-01-27 (×2): qty 1

## 2017-01-27 MED ORDER — FAMOTIDINE 20 MG PO TABS
20.0000 mg | ORAL_TABLET | Freq: Every day | ORAL | Status: DC
  Administered 2017-01-27 – 2017-01-28 (×2): 20 mg via ORAL
  Filled 2017-01-27 (×2): qty 1

## 2017-01-27 MED ORDER — CARBIDOPA-LEVODOPA 25-100 MG PO TABS
1.0000 | ORAL_TABLET | Freq: Three times a day (TID) | ORAL | Status: DC
  Administered 2017-01-27: 1 via ORAL
  Filled 2017-01-27 (×3): qty 1

## 2017-01-27 MED ORDER — POTASSIUM CHLORIDE CRYS ER 20 MEQ PO TBCR
20.0000 meq | EXTENDED_RELEASE_TABLET | Freq: Two times a day (BID) | ORAL | Status: DC
  Administered 2017-01-27 – 2017-01-28 (×4): 20 meq via ORAL
  Filled 2017-01-27 (×4): qty 1

## 2017-01-27 MED ORDER — PRAVASTATIN SODIUM 40 MG PO TABS
40.0000 mg | ORAL_TABLET | Freq: Every day | ORAL | Status: DC
  Administered 2017-01-27: 40 mg via ORAL
  Filled 2017-01-27: qty 1

## 2017-01-27 MED ORDER — ACETAMINOPHEN 325 MG PO TABS
650.0000 mg | ORAL_TABLET | Freq: Four times a day (QID) | ORAL | Status: DC | PRN
  Administered 2017-01-27 – 2017-01-28 (×2): 650 mg via ORAL
  Filled 2017-01-27 (×2): qty 2

## 2017-01-27 MED ORDER — BISACODYL 5 MG PO TBEC
5.0000 mg | DELAYED_RELEASE_TABLET | Freq: Every day | ORAL | Status: DC | PRN
Start: 2017-01-27 — End: 2017-01-28

## 2017-01-27 MED ORDER — PSYLLIUM 95 % PO PACK
1.0000 | PACK | Freq: Two times a day (BID) | ORAL | Status: DC
  Administered 2017-01-27 – 2017-01-28 (×3): 1 via ORAL
  Filled 2017-01-27 (×4): qty 1

## 2017-01-27 NOTE — Progress Notes (Signed)
Patient has home CPAP unit. RT filled water chamber with sterile water and placed on patient.

## 2017-01-27 NOTE — Progress Notes (Signed)
  Echocardiogram 2D Echocardiogram has been performed.  Lemon Sternberg G Chanci Ojala 01/27/2017, 10:17 AM

## 2017-01-27 NOTE — Care Management Note (Signed)
Case Management Note  Patient Details  Name: Ronnald Shedden MRN: [id number] Date of Birth: Jun 22, 1925  Subjective/Objective:        Admitted with syncopal episodes and cellulitis of R hand.Hx of parkinson's disease, HTN, CVA, colon Cancer and prostate cancer.             Dewan Emond (Spouse)     [phone]      PCP: Nelda Bucks  Action/Plan: PT evaluation pending .[name] KitchenMarland KitchenMarland KitchenCM following for disposition needs  Expected Discharge Date:                  Expected Discharge Plan:  Home/Self Care  In-House Referral:     Discharge planning Services  CM Consult  Post Acute Care Choice:    Choice offered to:     DME Arranged:    DME Agency:     HH Arranged:    HH Agency:     Status of Service:  In process, will continue to follow  If discussed at Long Length of Stay Meetings, dates discussed:    Additional Comments:  Sharin Mons, RN 01/27/2017, 9:12 PM

## 2017-01-27 NOTE — H&P (Signed)
History and Physical    Cresencio Reesor NOI:[id number] DOB: 30-Jul-1924 DOA: 01/26/2017  PCP: [name], MD   Patient coming from: Home   Chief Complaint: Right hand pain and swelling, recurrent syncope   HPI: Joe Glover is a 83 y.o. male with medical history significant for Parkinson's disease, obstructive sleep apnea, hypertension, GERD, insomnia, recurrent syncope with loop recorder, and chronic hyponatremia, now presenting to the emergency department for evaluation of pain and swelling in the right hand, as well as recurrent syncopal episodes. Patient is accompanied by his son and wife will assist with the history. He has reportedly been suffering recurrent syncopal episodes for years, has had a loop recorder in place for 2-3 years with no events identified per the knowledge of his family. He had more frequent syncope over the past week and has also begun to complain of pain and swelling in the right hand, which was also noted to be erythematous. He was evaluated at an urgent care for these complaints yesterday, was noted to have a mild fever at that time per report, and was treated with IV fluids and discharged home with Bactrim. After returning home, he had another syncopal episode and EMS was called for transport back to the hospital. Patient endorses pain, swelling, and redness in the right hand, but has no other complaints at this time. He denies chest pain or palpitations and denies headache, change in vision or hearing, or focal numbness or weakness. He recently had a CTA chest at [facility] which was negative for PE, also had a MRI brain which was negative for acute findings. Per family, his hyponatremia has been chronic.  ED Course: Upon arrival to the ED, patient is found to be afebrile, saturating well on room air, and with vital signs stable. EKG features a sinus tachycardia with rate 102, left anterior fascicular block, and anterior Q wave. Chest x-ray is notable for  patchy atelectasis or possibly mild infiltrate at the left base and mild central congestion. Chemistry panels notable for sodium of 125 and potassium 3.3. CBC features a normocytic anemia with hemoglobin of 11.6. Urinalysis is unremarkable, lactic acid is reassuring at 1.05, and troponin is undetectable. Blood cultures were obtained in the emergency department, 1 L of normal saline was administered, and the patient was started on empiric vancomycin and Zosyn. He remained hemodynamically stable and in no apparent respiratory distress and will be observed on the telemetry unit for ongoing evaluation and management of right hand cellulitis and recurrent syncopal episodes.  Review of Systems:  All other systems reviewed and apart from HPI, are negative.  Past Medical History:  Diagnosis Date  . Colon cancer ([place])   . Colon polyp   . GI bleed   . Hyperlipidemia   . Hypertension   . OSA (obstructive sleep apnea)   . PAF (paroxysmal atrial fibrillation) ([place])   . Parkinson disease ([place])   . Prostate cancer ([place])   . Stroke ([place])   . TIA (transient ischemic attack)   . Vitamin D deficiency     Past Surgical History:  Procedure Laterality Date  . COLECTOMY    . HERNIA REPAIR    . ORCHIECTOMY    . PROSTATECTOMY       reports that he quit smoking about 58 years ago. His smoking use included Cigarettes. He has a 40.00 pack-year smoking history. He has never used smokeless tobacco. He reports that he drinks alcohol. He reports that he does not use drugs.  Allergies  Allergen Reactions  . Anacin [Aspirin]     GI bleed  . Augmentin [Amoxicillin-Pot Clavulanate]     hives  . Hemocyte Plus [Hematinic Plus Vit-Minerals] Other (See Comments)    unknown  . Penicillins     hives    Family History  Problem Relation Age of Onset  . Parkinson's disease Mother   . Heart attack Father      Prior to Admission medications   Medication Sig Start Date End Date Taking? Authorizing Provider    potassium chloride (K-DUR,KLOR-CON) 10 MEQ tablet Take 10 mEq by mouth daily. Take 1 tablet daily 10/20/14  Yes [provider]  pravastatin (PRAVACHOL) 40 MG tablet Take 40 mg by mouth daily. Once daily 05/07/14  Yes [provider]  ranitidine (ZANTAC) 300 MG tablet Take 300 mg by mouth at bedtime.  04/26/14  Yes [provider]  temazepam (RESTORIL) 15 MG capsule Take 15 mg by mouth at bedtime.  04/26/14  Yes [provider]  Azelastine HCl 0.15 % SOLN Place 2 sprays into the nose 2 (two) times daily.    [provider]  carbidopa-levodopa (SINEMET IR) 25-100 MG per tablet Take 1 tablet by mouth 3 (three) times daily. 11/15/14   TatEustace Quail, DO  Cholecalciferol (VITAMIN D) 2000 UNITS CAPS Take by mouth daily. Takes 2 capsules daily    [provider]  cloNIDine (CATAPRES) 0.1 MG tablet  05/16/14   [provider]  Cyanocobalamin (VITAMIN B-12 IJ) Inject as directed. Once a month    [provider]  loratadine (CLARITIN) 10 MG tablet Take 10 mg by mouth daily.    [provider]  magnesium oxide (MAG-OX) 400 MG tablet Takes  Tablets twice daily    [provider]  omeprazole (PRILOSEC) 20 MG capsule 1 tab twice daily 04/26/14   [provider]  OVER THE COUNTER MEDICATION Take 27 mg by mouth daily. High Potency Iron takes 2 times daily    [provider]  psyllium (METAMUCIL) 58.6 % packet Take 1 packet by mouth 2 (two) times daily.     [provider]    Physical Exam: Vitals:   01/26/17 2329 01/27/17 0000 01/27/17 0015 01/27/17 0030  BP:  114/72 129/84 113/69  Pulse:  99 100 98  Resp:  17 (!) 22 19  Temp:    (S) 99.3 F (37.4 C)  TempSrc:    Rectal  SpO2:  96% 98% 98%  Weight: 74.8 kg (165 lb)     Height: 6' (1.829 m)         Constitutional: No acute distress, calm, comfortable Eyes: PERTLA, lids and conjunctivae normal ENMT: Mucous membranes are moist.  Posterior pharynx clear of any exudate or lesions.   Neck: normal, supple, no masses, no thyromegaly Respiratory: clear to auscultation bilaterally, no wheezing, no crackles. Normal respiratory effort.  Cardiovascular: S1 & S2 heard, regular rate and rhythm. No extremity edema. 2+ pedal pulses. No significant JVD. Abdomen: No distension, no tenderness, no masses palpated. Bowel sounds normal.  Musculoskeletal: no clubbing / cyanosis. No joint deformity upper and lower extremities.   Skin: Right hand edema, erythema, and tenderness over the dorsum without fluctuance or drainage. Crusted lesion on right ear, bilateral UE's. Neurologic: CN 2-12 grossly intact. Sensation intact. Strength 5/5 in bilateral grip and bilateral dorsi- and plantarflexion.  Psychiatric: Alert and oriented to person and place only. Calm and cooperative.     Labs on Admission: I have personally reviewed following  labs and imaging studies  CBC:  Recent Labs Lab 01/26/17 2335  WBC 7.7  NEUTROABS 7.0  HGB 11.6*  HCT 34.1*  MCV 79.3  PLT 867   Basic Metabolic Panel:  Recent Labs Lab 01/26/17 2335  NA 125*  K 3.3*  CL 94*  CO2 25  GLUCOSE 138*  BUN 11  CREATININE 0.94  CALCIUM 7.9*   GFR: Estimated Creatinine Clearance: 55.3 mL/min (by C-G formula based on SCr of 0.94 mg/dL). Liver Function Tests: No results for input(s): AST, ALT, ALKPHOS, BILITOT, PROT, ALBUMIN in the last 168 hours. No results for input(s): LIPASE, AMYLASE in the last 168 hours. No results for input(s): AMMONIA in the last 168 hours. Coagulation Profile: No results for input(s): INR, PROTIME in the last 168 hours. Cardiac Enzymes: No results for input(s): CKTOTAL, CKMB, CKMBINDEX, TROPONINI in the last 168 hours. BNP (last 3 results) No results for input(s): PROBNP in the last 8760 hours. HbA1C: No results for input(s): HGBA1C in the last 72 hours. CBG:  Recent Labs Lab 01/26/17 2336  GLUCAP 133*   Lipid Profile: No  results for input(s): CHOL, HDL, LDLCALC, TRIG, CHOLHDL, LDLDIRECT in the last 72 hours. Thyroid Function Tests: No results for input(s): TSH, T4TOTAL, FREET4, T3FREE, THYROIDAB in the last 72 hours. Anemia Panel: No results for input(s): VITAMINB12, FOLATE, FERRITIN, TIBC, IRON, RETICCTPCT in the last 72 hours. Urine analysis:    Component Value Date/Time   COLORURINE YELLOW 01/27/2017 0021   APPEARANCEUR CLEAR 01/27/2017 0021   LABSPEC 1.006 01/27/2017 0021   PHURINE 6.0 01/27/2017 0021   GLUCOSEU NEGATIVE 01/27/2017 0021   HGBUR NEGATIVE 01/27/2017 0021   BILIRUBINUR NEGATIVE 01/27/2017 0021   KETONESUR NEGATIVE 01/27/2017 0021   PROTEINUR NEGATIVE 01/27/2017 0021   UROBILINOGEN 0.2 10/10/2014 1146   NITRITE NEGATIVE 01/27/2017 0021   LEUKOCYTESUR NEGATIVE 01/27/2017 0021   Sepsis Labs: @LABRCNTIP (procalcitonin:4,lacticidven:4) )No results found for this or any previous visit (from the past 240 hour(s)).   Radiological Exams on Admission: Dg Chest Portable 1 View  Result Date: 01/27/2017 CLINICAL DATA:  Fever EXAM: PORTABLE CHEST 1 VIEW COMPARISON:  01/22/2017 FINDINGS: Patchy atelectasis at the left base. No pleural effusion. Enlarged cardiomediastinal silhouette with atherosclerosis. Biapical scarring. No pneumothorax. IMPRESSION: Patchy atelectasis or mild infiltrate at the left lung base. Stable enlarged cardiomediastinal silhouette with mild central vascular congestion. Electronically Signed   By: Donavan Foil M.D.   On: 01/27/2017 01:09    EKG: Independently reviewed. Sinus tachycardia (102), LAFB, anterior Q-wave.   Assessment/Plan  1. Cellulitis of right hand  - Pt presents with pain, swelling, erythema to dorsum of right hand suggestive of cellulitis; reportedly had fever at urgent care and was started on Bactrim   - No fever here, no leukocytosis, lactic acid reassuring at 1.05   - Blood cultures were obtained in ED, 1 liter of NS given, and he was started on  empiric vancomycin and Zosyn  - Plant to continue empiric abx with Rocephin for mild-moderate non-purulent cellulitis, elevate the arm   2. Recurrent syncope  - Pt has long history of recurrent syncope, followed by cardiology with loop recorder (no arrhythmias noted per family)  - Also following with neurology at [place] chest was negative for PE at [facility] and there is no hypoxia or chest pain  - Had recent MRI brain at [facility] with no acute findings  - Was reportedly orthostatic with EMS, but not here  - With hx  of Parkinson's, autonomic dysfunction is a concern   - Plan to continue cardiac monitoring and gentle IVF hydration, obtain echocardiogram   3. Parkinson's disease  - Follows with neurology at [facility]  - Continue Sinemet   4. Hyponatremia  - Serum sodium is 125 on admission - Patient appears hypovolemic and was given a liter of NS in ED  - Check urine sodium level, continue gentle IVF hydration, repeat chem panel in a few hours to avoid over-rapid correction    5. Hypokalemia  - Potassium is 3.3 on admission  - Chronic, managed at home with 10 mEq potassium qD  - Increase supplemental potassium to 20 mEq BID for now, continue telemetry monitoring, repeat chem panel    6. OSA - Continue CPAP qHS     DVT prophylaxis: sq heparin Code Status: Full  Family Communication: Wife and son updated at bedside Disposition Plan: Observe on telemetry Consults called: None Admission status: Observation    Vianne Bulls, MD [facility] Pager [phone]  If 7PM-7AM, please contact night-coverage [email or web address] Password [email or web address]  01/27/2017, 1:51 AM

## 2017-01-27 NOTE — Progress Notes (Signed)
Patient admitted after midnight, please see H&P.  Multiple syncopal events-- has seen cardiology/neurology in past for work up.  Suspect related to parkinson's.  Currently here for abx for right hand cellulitis.  Right hand swollen.  Continue abx.  Recently hospitalized at [facility]  Wife denies IV or blood stick from hand.  Eulogio Bear DO

## 2017-01-27 NOTE — Evaluation (Signed)
Physical Therapy Evaluation Patient Details Name: Joe Glover MRN: [id number] DOB: [birth year] Today's Date: 01/27/2017   History of Present Illness  Pt is a 83 y/o male admitted secondary to syncopal episodes and cellulitis of R hand. Chest X ray revealed mild infiltrate of L Lung base. PMH includes parkinson's disease, HTN, CVA, colon Cancer and prostate cancer.   Clinical Impression  Pt admitted secondary to problem above with deficits below. PTA, pt needed assist with ADLs from wife and required at least supervision for ambulation using RW secondary to parkinson's disease. Upon eval, pt limited by decreased balance, strength, and slowed motor processing secondary to parkinson's. Pt requiring min guard for safety throughout mobility. Required increased time for initiation of gait as well. Oxygen sats decreasing to 86% on RA during ambulation, however, increased to 94% with seated rest and cues for pursed lip breathing. Pt asymptomatic throughout. Pt's wife reports she will be available to assist as needed upon d/c. Recommend continuation of outpatient PT upon d/c. Will continue to follow acutely to ensure safety with mobility.     Follow Up Recommendations Outpatient PT;Supervision/Assistance - 24 hour (continuation of outpatient PT )    Equipment Recommendations  None recommended by PT    Recommendations for Other Services       Precautions / Restrictions Precautions Precautions: Fall Restrictions Weight Bearing Restrictions: No      Mobility  Bed Mobility               General bed mobility comments: Sitting EOB with family upon entry   Transfers Overall transfer level: Needs assistance Equipment used: Rolling walker (2 wheeled) Transfers: Sit to/from Stand Sit to Stand: Min guard         General transfer comment: Min guard for safety. Verbal cues for safe hand placement. Required extended time for transfer.   Ambulation/Gait Ambulation/Gait assistance: Min  guard Ambulation Distance (Feet): 50 Feet Assistive device: Rolling walker (2 wheeled) Gait Pattern/deviations: Step-through pattern;Decreased stride length;Shuffle;Trunk flexed Gait velocity: Decreased Gait velocity interpretation: Below normal speed for age/gender General Gait Details: slow, parkinsonian gait. No overt LOB noted during ambulation. Cues for upright posture. Oxygen sats decreased to 86% on RA, however, elevated to 94% on RA with seated rest and cues for pursed lip breathing.   Stairs            Wheelchair Mobility    Modified Rankin (Stroke Patients Only)       Balance Overall balance assessment: Needs assistance Sitting-balance support: No upper extremity supported;Feet supported Sitting balance-Leahy Scale: Fair     Standing balance support: No upper extremity supported;During functional activity;Bilateral upper extremity supported Standing balance-Leahy Scale: Fair Standing balance comment: Able to stand at sink without UE support to brush teeth.                              Pertinent Vitals/Pain Pain Assessment: No/denies pain    Home Living Family/patient expects to be discharged to:: Private residence Living Arrangements: Spouse/significant other Available Help at Discharge: Family;Available 24 hours/day Type of Home: House Home Access: Ramped entrance     Home Layout: Multi-level (3 level; does not have to go downstairs) Home Equipment: Walker - 2 wheels;Cane - single point;Bedside commode;Shower seat      Prior Function Level of Independence: Needs assistance   Gait / Transfers Assistance Needed: Wife supervises for longer distances. Reports they go to walk out in the community on level surfaces in  order to make sure pt stays active. Needs assist with stair navigation.   ADL's / Homemaking Assistance Needed: Needs assist with bathing and dressing.         Hand Dominance        Extremity/Trunk Assessment   Upper  Extremity Assessment Upper Extremity Assessment: Generalized weakness    Lower Extremity Assessment Lower Extremity Assessment: Generalized weakness (grossly 3+/5 throughout )    Cervical / Trunk Assessment Cervical / Trunk Assessment: Kyphotic  Communication   Communication: Other (comment) (slurred speech )  Cognition Arousal/Alertness: Awake/alert Behavior During Therapy: WFL for tasks assessed/performed Overall Cognitive Status: Within Functional Limits for tasks assessed                                        General Comments General comments (skin integrity, edema, etc.): Pt's wife and son present during session. Reports pt is getting outpatient PT, currently as well as outpatient speech therapy. Educated about moving exercise bike up to 2nd floor so pt has less stairs to navigate at home. Educated about safe stair navigation at home.     Exercises     Assessment/Plan    PT Assessment Patient needs continued PT services  PT Problem List Decreased strength;Decreased balance;Decreased mobility;Decreased coordination;Decreased knowledge of use of DME;Cardiopulmonary status limiting activity       PT Treatment Interventions DME instruction;Stair training;Gait training;Functional mobility training;Therapeutic activities;Neuromuscular re-education;Balance training;Therapeutic exercise;Patient/family education    PT Goals (Current goals can be found in the Care Plan section)  Acute Rehab PT Goals Patient Stated Goal: to go home  PT Goal Formulation: With patient Time For Goal Achievement: 02/10/17 Potential to Achieve Goals: Good    Frequency Min 3X/week   Barriers to discharge        Co-evaluation               AM-PAC PT "6 Clicks" Daily Activity  Outcome Measure Difficulty turning over in bed (including adjusting bedclothes, sheets and blankets)?: A Little Difficulty moving from lying on back to sitting on the side of the bed? :  Total Difficulty sitting down on and standing up from a chair with arms (e.g., wheelchair, bedside commode, etc,.)?: Total Help needed moving to and from a bed to chair (including a wheelchair)?: A Little Help needed walking in hospital room?: A Little Help needed climbing 3-5 steps with a railing? : A Lot 6 Click Score: 13    End of Session Equipment Utilized During Treatment: Gait belt Activity Tolerance: Patient tolerated treatment well Patient left: in chair;with call bell/phone within reach;with chair alarm set;with family/visitor present;with nursing/sitter in room Nurse Communication: Mobility status PT Visit Diagnosis: Unsteadiness on feet (R26.81);Other symptoms and signs involving the nervous system (R29.898)    Time: 3016-0109 PT Time Calculation (min) (ACUTE ONLY): 30 min   Charges:   PT Evaluation $PT Eval Moderate Complexity: 1 Procedure PT Treatments $Gait Training: 8-22 mins   PT G Codes:   PT G-Codes **NOT FOR INPATIENT CLASS** Functional Assessment Tool Used: AM-PAC 6 Clicks Basic Mobility;Clinical judgement Functional Limitation: Mobility: Walking and moving around Mobility: Walking and Moving Around Current Status (N2355): At least 40 percent but less than 60 percent impaired, limited or restricted Mobility: Walking and Moving Around Goal Status [phone]): At least 20 percent but less than 40 percent impaired, limited or restricted    Leighton Ruff, PT, DPT  Acute Rehabilitation Services  Pager: [phone]   Rudean Hitt 01/27/2017, 1:14 PM

## 2017-01-28 DIAGNOSIS — E876 Hypokalemia: Secondary | ICD-10-CM

## 2017-01-28 DIAGNOSIS — E871 Hypo-osmolality and hyponatremia: Secondary | ICD-10-CM

## 2017-01-28 DIAGNOSIS — I959 Hypotension, unspecified: Secondary | ICD-10-CM

## 2017-01-28 DIAGNOSIS — G4733 Obstructive sleep apnea (adult) (pediatric): Secondary | ICD-10-CM

## 2017-01-28 LAB — BASIC METABOLIC PANEL
Anion gap: 6 (ref 5–15)
BUN: 11 mg/dL (ref 6–20)
CO2: 20 mmol/L — ABNORMAL LOW (ref 22–32)
Calcium: 7.7 mg/dL — ABNORMAL LOW (ref 8.9–10.3)
Chloride: 103 mmol/L (ref 101–111)
Creatinine, Ser: 0.96 mg/dL (ref 0.61–1.24)
Glucose, Bld: 96 mg/dL (ref 65–99)
POTASSIUM: 3.3 mmol/L — AB (ref 3.5–5.1)
SODIUM: 129 mmol/L — AB (ref 135–145)

## 2017-01-28 LAB — CBC
HCT: 30.2 % — ABNORMAL LOW (ref 39.0–52.0)
Hemoglobin: 10.2 g/dL — ABNORMAL LOW (ref 13.0–17.0)
MCH: 27 pg (ref 26.0–34.0)
MCHC: 33.8 g/dL (ref 30.0–36.0)
MCV: 79.9 fL (ref 78.0–100.0)
PLATELETS: 203 10*3/uL (ref 150–400)
RBC: 3.78 MIL/uL — AB (ref 4.22–5.81)
RDW: 14.6 % (ref 11.5–15.5)
WBC: 3.1 10*3/uL — AB (ref 4.0–10.5)

## 2017-01-28 LAB — GLUCOSE, CAPILLARY: GLUCOSE-CAPILLARY: 94 mg/dL (ref 65–99)

## 2017-01-28 LAB — URINE CULTURE: Culture: NO GROWTH

## 2017-01-28 MED ORDER — CARBIDOPA-LEVODOPA 25-100 MG PO TABS: 1.0000 | ORAL_TABLET | Freq: Three times a day (TID) | ORAL | 2 refills | Status: DC

## 2017-01-28 MED ORDER — AZITHROMYCIN 500 MG PO TABS: 500.0000 mg | ORAL_TABLET | Freq: Every day | ORAL | Status: DC

## 2017-01-28 MED ORDER — DOXYCYCLINE HYCLATE 100 MG PO TABS: 100.0000 mg | ORAL_TABLET | Freq: Two times a day (BID) | ORAL | 0 refills | Status: DC

## 2017-01-28 MED ORDER — DOXYCYCLINE HYCLATE 100 MG PO TABS
100.0000 mg | ORAL_TABLET | Freq: Two times a day (BID) | ORAL | Status: DC
  Administered 2017-01-28: 100 mg via ORAL
  Filled 2017-01-28: qty 1

## 2017-01-28 MED ORDER — UNABLE TO FIND: 0 refills | Status: DC

## 2017-01-28 NOTE — Progress Notes (Signed)
Physical Therapy Treatment Patient Details Name: Joe Glover MRN: [id number] DOB: February 06, 1925 Today's Date: 01/28/2017    History of Present Illness Pt is a 83 y/o male admitted secondary to syncopal episodes and cellulitis of R hand. Chest X ray revealed mild infiltrate of L Lung base. PMH includes parkinson's disease, HTN, CVA, colon Cancer and prostate cancer.     PT Comments    Pt has improved well, needing min guard overall and ambulating/completing stairs with no assist and maintaining SpO2 in the mid to upper 90's on RA.   Follow Up Recommendations  Outpatient PT;Supervision/Assistance - 24 hour     Equipment Recommendations  None recommended by PT    Recommendations for Other Services       Precautions / Restrictions Precautions Precautions: Fall    Mobility  Bed Mobility Overal bed mobility: Needs Assistance Bed Mobility: Supine to Sit     Supine to sit: Min guard        Transfers Overall transfer level: Needs assistance Equipment used: Rolling walker (2 wheeled) Transfers: Sit to/from Stand Sit to Stand: Min guard         General transfer comment: cues for safe hand placement otherwise no assist  Ambulation/Gait Ambulation/Gait assistance: Min guard Ambulation Distance (Feet): 100 Feet (x3) Assistive device: Rolling walker (2 wheeled) Gait Pattern/deviations: Step-through pattern Gait velocity: Decreased Gait velocity interpretation: at or above normal speed for age/gender General Gait Details: slow, mildly halted gait R>L, not so parkinson's like exept when changing directions.  Sats on RA at rest or during gait/mobility always 96% or better and between 89 and 98 bpm.  with mild dyspnea.   Stairs Stairs: Yes   Stair Management: One rail Right;Step to pattern;Backwards;Forwards;Alternating pattern Number of Stairs: 4 General stair comments: safe with rail  Wheelchair Mobility    Modified Rankin (Stroke Patients Only)       Balance  Overall balance assessment: Needs assistance   Sitting balance-Leahy Scale: Good     Standing balance support: Single extremity supported;Bilateral upper extremity supported Standing balance-Leahy Scale: Fair                              Cognition Arousal/Alertness: Awake/alert Behavior During Therapy: WFL for tasks assessed/performed Overall Cognitive Status: Within Functional Limits for tasks assessed                                        Exercises General Exercises - Lower Extremity Heel Slides: AROM;Strengthening;Both;10 reps;Supine (graded resistance) Straight Leg Raises: AROM;Strengthening;Both;10 reps;Supine Other Exercises Other Exercises: bicep/tricep pressess x10 reps bil.    General Comments General comments (skin integrity, edema, etc.): SpO2 on RA at rest pluse HR was 96% at 98 bpm, During gait on RA 97% at 91 bpm, after stairs 96% at 98 bpm.      Pertinent Vitals/Pain Pain Assessment: No/denies pain    Home Living                      Prior Function            PT Goals (current goals can now be found in the care plan section) Acute Rehab PT Goals Patient Stated Goal: to go home  PT Goal Formulation: With patient Time For Goal Achievement: 02/10/17 Potential to Achieve Goals: Good Progress towards PT goals: Progressing toward goals  Frequency    Min 3X/week      PT Plan Current plan remains appropriate    Co-evaluation              AM-PAC PT "6 Clicks" Daily Activity  Outcome Measure  Difficulty turning over in bed (including adjusting bedclothes, sheets and blankets)?: A Little Difficulty moving from lying on back to sitting on the side of the bed? : A Little Difficulty sitting down on and standing up from a chair with arms (e.g., wheelchair, bedside commode, etc,.)?: A Little Help needed moving to and from a bed to chair (including a wheelchair)?: A Little Help needed walking in hospital room?:  A Little Help needed climbing 3-5 steps with a railing? : A Little 6 Click Score: 18    End of Session   Activity Tolerance: Patient tolerated treatment well Patient left: with call bell/phone within reach;with family/visitor present;in bed Nurse Communication: Mobility status PT Visit Diagnosis: Unsteadiness on feet (R26.81);Other abnormalities of gait and mobility (R26.89)     Time: 0511-0211 PT Time Calculation (min) (ACUTE ONLY): 29 min  Charges:  $Gait Training: 8-22 mins $Therapeutic Exercise: 8-22 mins                    G Codes:       February 14, 2017  Donnella Sham, PT [phone]  (pager)   Tessie Fass Nioka [place] Feb 14, 2017, 2:33 PM

## 2017-01-28 NOTE — Discharge Summary (Signed)
Physician Discharge Summary  [place] HDQ:[id number] DOB: 31-Oct-1924 DOA: 01/26/2017  PCP: [name], MD  Admit date: 01/26/2017 Discharge date: 01/28/2017   Recommendations for Outpatient Follow-Up:   1. Outpatient PT 2. Suspect syncope related to Parkinson's- close neuro follow up 3. BMP 1 week    Discharge Diagnosis:   Principal Problem:   Cellulitis of right hand Active Problems:   OSA (obstructive sleep apnea)   Syncope   Parkinson disease (HCC)   Hyponatremia   Hypokalemia   Normocytic anemia   Discharge disposition:  Home.  Discharge Condition: Improved.  Diet recommendation:   Regular.  Wound care: None.   History of Present Illness:    Joe Glover is a 83 y.o. male with medical history significant for Parkinson's disease, obstructive sleep apnea, hypertension, GERD, insomnia, recurrent syncope with loop recorder, and chronic hyponatremia, now presenting to the emergency department for evaluation of pain and swelling in the right hand, as well as recurrent syncopal episodes. Patient is accompanied by his son and wife will assist with the history. He has reportedly been suffering recurrent syncopal episodes for years, has had a loop recorder in place for 2-3 years with no events identified per the knowledge of his family. He had more frequent syncope over the past week and has also begun to complain of pain and swelling in the right hand, which was also noted to be erythematous. He was evaluated at an urgent care for these complaints yesterday, was noted to have a mild fever at that time per report, and was treated with IV fluids and discharged home with Bactrim. After returning home, he had another syncopal episode and EMS was called for transport back to the hospital. Patient endorses pain, swelling, and redness in the right hand, but has no other complaints at this time. He denies chest pain or palpitations and denies headache, change in vision or  hearing, or focal numbness or weakness. He recently had a CTA chest at [facility] which was negative for PE, also had a MRI brain which was negative for acute findings. Per family, his hyponatremia has been chronic.   Hospital Course by Problem:   Right hand cellulitis -PO abx (doxy to also cover a possible PNA)  Syncope -appears to be related to Parkinson's -resume TED hose -close neuro follow up  Hyponatremia -improved with IV fluids -encourage hydration with juice, gatorade etc  OSA -continue CPAP  Hypokalemia/hypomagnesium -replaced -follow outpatient   Medical Consultants:    None.   Discharge Exam:   Vitals:   01/28/17 0521 01/28/17 1033  BP: 126/75 133/82  Pulse: 86 82  Resp: 18 20  Temp: 98.3 F (36.8 C) 98.1 F (36.7 C)   Vitals:   01/28/17 0206 01/28/17 0521 01/28/17 0629 01/28/17 1033  BP: 137/79 126/75  133/82  Pulse: 83 86  82  Resp: 20 18  20   Temp: 98 F (36.7 C) 98.3 F (36.8 C)  98.1 F (36.7 C)  TempSrc: Oral Oral  Oral  SpO2: 99% 95%  99%  Weight:   77.2 kg (170 lb 3.2 oz)   Height:        Gen:  NAD    The results of significant diagnostics from this hospitalization (including imaging, microbiology, ancillary and laboratory) are listed below for reference.     Procedures and Diagnostic Studies:   Dg Chest Portable 1 View  Result Date: 01/27/2017 CLINICAL DATA:  Fever EXAM: PORTABLE CHEST 1 VIEW COMPARISON:  01/22/2017 FINDINGS: Patchy  atelectasis at the left base. No pleural effusion. Enlarged cardiomediastinal silhouette with atherosclerosis. Biapical scarring. No pneumothorax. IMPRESSION: Patchy atelectasis or mild infiltrate at the left lung base. Stable enlarged cardiomediastinal silhouette with mild central vascular congestion. Electronically Signed   By: Donavan Foil M.D.   On: 01/27/2017 01:09     Labs:   Basic Metabolic Panel:  Recent Labs Lab 01/26/17 2335 01/27/17 0345 01/28/17 0329  NA 125* 131*  129*  K 3.3* 3.4* 3.3*  CL 94* 103 103  CO2 25 21* 20*  GLUCOSE 138* 110* 96  BUN 11 10 11   CREATININE 0.94 0.84 0.96  CALCIUM 7.9* 7.2* 7.7*  MG  --  1.3*  --    GFR Estimated Creatinine Clearance: 55.8 mL/min (by C-G formula based on SCr of 0.96 mg/dL). Liver Function Tests: No results for input(s): AST, ALT, ALKPHOS, BILITOT, PROT, ALBUMIN in the last 168 hours. No results for input(s): LIPASE, AMYLASE in the last 168 hours. No results for input(s): AMMONIA in the last 168 hours. Coagulation profile No results for input(s): INR, PROTIME in the last 168 hours.  CBC:  Recent Labs Lab 01/26/17 2335 01/27/17 0345 01/28/17 0329  WBC 7.7 6.4 3.1*  NEUTROABS 7.0 5.8  --   HGB 11.6* 10.8* 10.2*  HCT 34.1* 32.7* 30.2*  MCV 79.3 79.0 79.9  PLT 207 209 203   Cardiac Enzymes: No results for input(s): CKTOTAL, CKMB, CKMBINDEX, TROPONINI in the last 168 hours. BNP: Invalid input(s): POCBNP CBG:  Recent Labs Lab 01/26/17 2336 01/27/17 0801 01/28/17 0746  GLUCAP 133* 102* 94   D-Dimer No results for input(s): DDIMER in the last 72 hours. Hgb A1c No results for input(s): HGBA1C in the last 72 hours. Lipid Profile No results for input(s): CHOL, HDL, LDLCALC, TRIG, CHOLHDL, LDLDIRECT in the last 72 hours. Thyroid function studies No results for input(s): TSH, T4TOTAL, T3FREE, THYROIDAB in the last 72 hours.  Invalid input(s): FREET3 Anemia work up No results for input(s): VITAMINB12, FOLATE, FERRITIN, TIBC, IRON, RETICCTPCT in the last 72 hours. Microbiology Recent Results (from the past 240 hour(s))  Urine culture     Status: None   Collection Time: 01/27/17 12:21 AM  Result Value Ref Range Status   Specimen Description URINE, CLEAN CATCH  Final   Special Requests NONE  Final   Culture NO GROWTH  Final   Report Status 01/28/2017 FINAL  Final  Blood culture (routine x 2)     Status: None (Preliminary result)   Collection Time: 01/27/17 12:45 AM  Result Value Ref  Range Status   Specimen Description BLOOD RIGHT ARM  Final   Special Requests   Final    BOTTLES DRAWN AEROBIC AND ANAEROBIC Blood Culture adequate volume   Culture NO GROWTH 1 DAY  Final   Report Status PENDING  Incomplete  Blood culture (routine x 2)     Status: None (Preliminary result)   Collection Time: 01/27/17  1:00 AM  Result Value Ref Range Status   Specimen Description BLOOD LEFT WRIST  Final   Special Requests   Final    BOTTLES DRAWN AEROBIC AND ANAEROBIC Blood Culture adequate volume   Culture NO GROWTH 1 DAY  Final   Report Status PENDING  Incomplete     Discharge Instructions:   Discharge Instructions    Diet general    Complete by:  As directed    Discharge instructions    Complete by:  As directed    Elevate hand above level of  heart if able Outpatient PT- 24 hour supervision -close neuro follow up -TED hose BMP with PCP 1 week   Increase activity slowly    Complete by:  As directed      Allergies as of 01/28/2017      Reactions   Anacin [aspirin]    GI bleed   Augmentin [amoxicillin-pot Clavulanate]    hives   Hemocyte Plus [hematinic Plus Vit-minerals] Other (See Comments)   unknown   Penicillins    hives      Medication List    STOP taking these medications   cloNIDine 0.1 MG tablet Commonly known as:  CATAPRES     TAKE these medications   Azelastine HCl 0.15 % Soln Place 2 sprays into the nose 2 (two) times daily as needed (congestion).   carbidopa-levodopa 25-100 MG tablet Commonly known as:  SINEMET IR Take 1 tablet by mouth 3 (three) times daily.   CULTURELLE DIGESTIVE HEALTH PO Take 1 tablet by mouth daily.   doxycycline 100 MG tablet Commonly known as:  VIBRA-TABS Take 1 tablet (100 mg total) by mouth every 12 (twelve) hours.   EPINEPHrine 0.3 mg/0.3 mL Soaj injection Commonly known as:  EPI-PEN Inject 0.3 mg into the muscle daily as needed (allergic reaction).   ferrous sulfate 325 (65 FE) MG tablet Take 325 mg by mouth  daily with breakfast.   loratadine 10 MG tablet Commonly known as:  CLARITIN Take 10 mg by mouth daily.   magnesium oxide 400 MG tablet Commonly known as:  MAG-OX Take 800 mg by mouth 2 (two) times daily. Takes  Tablets twice daily   omeprazole 20 MG capsule Commonly known as:  PRILOSEC Take 20 mg by mouth daily. 1 tab twice daily   potassium chloride 10 MEQ tablet Commonly known as:  K-DUR,KLOR-CON Take 20 mEq by mouth daily. Take 1 tablet daily   pravastatin 40 MG tablet Commonly known as:  PRAVACHOL Take 40 mg by mouth daily. Once daily   psyllium 58.6 % packet Commonly known as:  METAMUCIL Take 1 packet by mouth 2 (two) times daily.   ranitidine 300 MG tablet Commonly known as:  ZANTAC Take 300 mg by mouth at bedtime.   temazepam 15 MG capsule Commonly known as:  RESTORIL Take 15 mg by mouth at bedtime.   VITAMIN N-02 IJ Inject 1 Applicatorful as directed every 30 (thirty) days.   Vitamin D 2000 units Caps Take 4,000 Units by mouth daily. Takes 2 capsules daily      Follow-up Information    [name], MD Follow up in 1 week(s).   Specialty:  Internal Medicine Why:  for right hand cellulitis check BMP for Na  Contact information: [place] [id number] [phone]            Time coordinating discharge: 35 min  Signed:  Charlei Ramsaran Alison Stalling   [facility] 01/28/2017, 2:44 PM

## 2017-01-28 NOTE — Progress Notes (Signed)
Pt & wife given discharge instructions, prescriptions, and care notes. Pt verbalized understanding AEB no further questions or concerns at this time. IV was discontinued, no redness, pain, or swelling noted at this time. Telemetry discontinued and Centralized Telemetry was notified. Pt left the floor via wheelchair with staff in stable condition. 

## 2017-01-28 NOTE — Progress Notes (Signed)
SATURATION QUALIFICATIONS: (This note is used to comply with regulatory documentation for home oxygen)  Patient Saturations on Room Air at Rest = 96%  Patient Saturations on Room Air while Ambulating = 97%  Please briefly explain why patient needs home oxygen:pt does not need supplemental oxygen at this time. 01/28/2017  Donnella Sham, [place] [phone]  (pager)

## 2017-02-01 LAB — CULTURE, BLOOD (ROUTINE X 2)
CULTURE: NO GROWTH
CULTURE: NO GROWTH
SPECIAL REQUESTS: ADEQUATE
Special Requests: ADEQUATE

## 2017-02-25 DIAGNOSIS — W19XXXA Unspecified fall, initial encounter: Secondary | ICD-10-CM | POA: Insufficient documentation

## 2017-02-25 HISTORY — DX: Unspecified fall, initial encounter: W19.XXXA

## 2017-02-25 NOTE — Progress Notes (Signed)
Cardiology Office Note:    Date:  02/26/2017   ID:  Joe Glover, DOB 1924-07-26, MRN [id number]  PCP:  [name], MD  Cardiologist:  Shirlee More, MD    Referring MD: [name], MD    ASSESSMENT:    1. Syncope, unspecified syncope type   2. Essential hypertension   3. Orthostatic hypotension   4. PAF (paroxysmal atrial fibrillation) (HCC)   5. Parkinson disease ([place])    PLAN:    In order of problems listed above:  1. Continues to do poorly with 3 episodes of syncope in July on associated with arrhythmia. 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 He is taking a low dose of clonidine I asked him to stop it and his wife will give it only for systolics greater than 409. He's had no palpitation chest pain or shortness of breath and has been seen by his PCP and neurology after these ED visits. He will start to use both elastic compressive hose as well as an abdominal binder to try to mitigate his autonomic insufficiency and prevent venous pooling. 2. Stable, the consequences Billee Cashing is of hypotension I weigh those of hypertension he'll take his antihypertensive agent clonidine only when necessary for systolics greater than 811 3. Continued episodes of syncope 4. Stable no clinical recurrence including loop recording 5. Stable managed by neurology   Next appointment: 6 months   Medication Adjustments/Labs and Tests Ordered: Current medicines are reviewed at length with the patient today.  Concerns regarding medicines are outlined above.  No orders of the defined types were placed in this encounter.  No orders of the defined types were placed in this encounter.   Chief Complaint  Patient presents with  . Follow-up    routine flup appt   . Dizziness  . Loss of Consciousness    History of Present Illness:    Joe Glover is a 83 y.o. male with a hx of HTN, HLD, colon cancer (S/P colectomy), prostate cancer (S/P prostatectomy and  orchiectomy), GERD, severe right knee OA, vascular Parkinsonism with baseline mild dysarthria, OSA on CPAP, paroxysmal A. fib (not on anticoagulation; prior GI bleed in 2014 requiring 16 units PRBCs) and recurrent syncopal episodes last seen 6 months ago.. Compliance with diet, lifestyle and medications: Yes he is supervised by his wife Past Medical History:  Diagnosis Date  . Awareness alteration, transient 11/02/2014  . Cellulitis of right hand 01/27/2017  . Colon cancer ([place])   . Colon polyp   . Essential hypertension 09/23/2012  . Fall 09/16/2016  . Fall from standing 02/25/2017  . GI bleed   . Hyperlipidemia   . Hypertension   . Hypokalemia 01/27/2017  . Hyponatremia 01/27/2017  . Insomnia 08/13/2016  . Normocytic anemia 01/27/2017  . On amiodarone therapy 08/06/2015  . Orthostatic hypotension 08/06/2015  . OSA (obstructive sleep apnea)   . PAF (paroxysmal atrial fibrillation) ([place])   . Parkinson disease ([place])   . Prostate cancer ([place])   . SDH (subdural hematoma) ([place]) 09/16/2016  . Status post placement of implantable loop recorder 07/11/2015  . Stroke ([place])   . 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 Subdural hematoma ([place]) 09/05/2016  . Syncope 11/02/2014  . TIA (transient ischemic attack)   . Vitamin D deficiency     Past Surgical History:  Procedure Laterality Date  . COLECTOMY    . HERNIA REPAIR    .  ORCHIECTOMY    . PROSTATECTOMY      Current Medications: Current Meds  Medication Sig  . Azelastine HCl 0.15 % SOLN Place 2 sprays into the nose 2 (two) times daily as needed (congestion).   . Cholecalciferol (VITAMIN D) 2000 UNITS CAPS Take 2,000 Units by mouth daily.   . cloNIDine (CATAPRES) 0.1 MG tablet Take 0.5 tablets by mouth daily as needed. For a systolic Bp > 366 mm Hg  . cyanocobalamin (,VITAMIN B-12,) 1000 MCG/ML injection Inject 1,000 mcg into the muscle every 30 (thirty) days.    [name] Kitchen EPINEPHrine 0.3 mg/0.3 mL IJ SOAJ injection Inject 0.3 mg into the muscle daily as needed (allergic reaction).  . ferrous sulfate 325 (65 FE) MG tablet Take 325 mg by mouth 2 (two) times daily with a meal.   . loratadine (CLARITIN) 10 MG tablet Take 10 mg by mouth daily.  . magnesium oxide (MAG-OX) 400 MG tablet Take 800 mg by mouth 2 (two) times daily.   [name] Kitchen omeprazole (PRILOSEC) 20 MG capsule Take 20 mg by mouth daily.   . potassium chloride (K-DUR,KLOR-CON) 10 MEQ tablet Take 20 mEq by mouth daily.   . pravastatin (PRAVACHOL) 40 MG tablet Take 40 mg by mouth daily. Once daily  . Probiotic Product (PROBIOTIC DAILY PO) Take 1 tablet by mouth 2 (two) times daily.  . psyllium (METAMUCIL) 58.6 % packet Take 1 packet by mouth 2 (two) times daily.   . ranitidine (ZANTAC) 300 MG tablet Take 300 mg by mouth at bedtime.   . temazepam (RESTORIL) 15 MG capsule Take 15 mg by mouth at bedtime.   . triamcinolone ointment (KENALOG) 0.1 % APPLY A THIN LAYER TO RIGHT LEG THREE TIMES DAILY AS NEEDED     Allergies:   Anacin [aspirin]; Augmentin [amoxicillin-pot clavulanate]; Hemocyte plus [hematinic plus vit-minerals]; and Penicillins   Social History   Social History  . Marital status: Married    Spouse name: N/A  . Number of children: 2  . Years of education: N/A   Occupational History  . retired    Social History Main Topics  . Smoking status: Former Smoker    Packs/day: 2.00    Years: 20.00    Types: Cigarettes    Quit date: 07/08/1958  . Smokeless tobacco: Never Used  . Alcohol use 0.0 oz/week     Comment: occasional - 1-2 drinks a month  . Drug use: No  . Sexual activity: Not Asked   Other Topics Concern  . None   Social History Narrative  . None     Family History: The patient's family history includes Heart attack in his brother and father; Parkinson's disease in his mother. ROS:   Please see the history of present illness.    All other systems reviewed and are  negative.  EKGs/Labs/Other Studies Reviewed:    The following studies were reviewed today:  [facility] multiple ED records reviewed prior to visit  ILR: Date of Transmission: February 13, 2017 Patient MR Number: [id number] Patient Name: Joe Glover, Apr 13, 1925, 83 y.o. Following Provider: Dereck Leep, MD Primary Care Provider: [name], MD Manufacturer of Device: Medtronic Type of Device: Implantable Loop Monitor See scanned/downloaded PDF report for model numbers, serial numbers, and date(s) of implant. Presenting Rhythm:  SINUS 78 Heart Rate Variability: Good HRV Patient Activity: Patient Active less than 1 hr/day Device Findings: Please see downloaded PDF file of transmission under Media Tab for full details of device interrogation to include, when applicable,  battery status/charge time, lead trend data, and programmed parameters. Battery status OK/stable AF detected episodes 0% AFib/tach burden Symptom marked episodes; 3 episodes: Irregular VS showing with occassional PVC's     Recent Labs: 01/27/2017: Magnesium 1.3 01/28/2017: BUN 11; Creatinine, Ser 0.96; Hemoglobin 10.2; Platelets 203; Potassium 3.3; Sodium 129  Recent Lipid Panel No results found for: CHOL, TRIG, HDL, CHOLHDL, VLDL, LDLCALC, LDLDIRECT  Physical Exam:    VS:  BP 138/88 (BP Location: Right Arm, Patient Position: Sitting)   Pulse 75   Ht 6' (1.829 m)   Wt 161 lb 6.4 oz (73.2 kg)   SpO2 96%   BMI 21.89 kg/m     Wt Readings from Last 3 Encounters:  02/26/17 161 lb 6.4 oz (73.2 kg)  01/28/17 170 lb 3.2 oz (77.2 kg)  08/13/16 167 lb 9.6 oz (76 kg)     GEN:  Well nourished, well developed in no acute distress HEENT: Normal NECK: No JVD; No carotid bruits LYMPHATICS: No lymphadenopathy CARDIAC: RRR, no murmurs, rubs, gallops RESPIRATORY:  Clear to auscultation without rales, wheezing or rhonchi  ABDOMEN: Soft, non-tender, non-distended MUSCULOSKELETAL:  No edema;  No deformity  SKIN: Warm and dry NEUROLOGIC:  Alert and oriented x 3 PSYCHIATRIC:  Normal affect    Signed, Shirlee More, MD  02/26/2017 5:06 PM    [facility]

## 2017-02-26 ENCOUNTER — Ambulatory Visit (INDEPENDENT_AMBULATORY_CARE_PROVIDER_SITE_OTHER): Payer: Medicare Other | Admitting: Cardiology

## 2017-02-26 ENCOUNTER — Encounter: Payer: Self-pay | Admitting: Cardiology

## 2017-02-26 VITALS — BP 138/88 | HR 75 | Ht 72.0 in | Wt 161.4 lb

## 2017-02-26 DIAGNOSIS — I951 Orthostatic hypotension: Secondary | ICD-10-CM

## 2017-02-26 DIAGNOSIS — I10 Essential (primary) hypertension: Secondary | ICD-10-CM | POA: Diagnosis not present

## 2017-02-26 DIAGNOSIS — G20 Parkinson's disease: Secondary | ICD-10-CM | POA: Diagnosis not present

## 2017-02-26 DIAGNOSIS — I480 Paroxysmal atrial fibrillation: Secondary | ICD-10-CM

## 2017-02-26 DIAGNOSIS — R55 Syncope and collapse: Secondary | ICD-10-CM

## 2017-02-26 NOTE — Patient Instructions (Addendum)
Medication Instructions:  Your physician recommends that you continue on your current medications as directed. Please refer to the Current Medication list given to you today.   Labwork: None  Testing/Procedures: None  Follow-Up: Your physician wants you to follow-up in: 6 months. You will receive a reminder letter in the mail two months in advance. If you don't receive a letter, please call our office to schedule the follow-up appointment.   Any Other Special Instructions Will Be Listed Below (If Applicable).     If you need a refill on your cardiac medications before your next appointment, please call your pharmacy.    He needs to wear elastic compression hose and an abdominal binder

## 2017-08-15 ENCOUNTER — Ambulatory Visit (INDEPENDENT_AMBULATORY_CARE_PROVIDER_SITE_OTHER): Payer: Medicare Other | Admitting: Adult Health

## 2017-08-15 ENCOUNTER — Encounter: Payer: Self-pay | Admitting: Adult Health

## 2017-08-15 VITALS — BP 118/64 | HR 77 | Ht 72.0 in | Wt 163.2 lb

## 2017-08-15 DIAGNOSIS — G4733 Obstructive sleep apnea (adult) (pediatric): Secondary | ICD-10-CM | POA: Diagnosis not present

## 2017-08-15 NOTE — Patient Instructions (Signed)
Continue on CPAP At bedtime  .  Keep up good work .  Do not drive if sleepy .  Set up for mask fitting .  Change CPAP pressure to 5 to 15cm - we will take care of this.  Follow up with Dr. Elsworth Soho  In 1 year  CPAP download in 1 month and As needed

## 2017-08-15 NOTE — Assessment & Plan Note (Signed)
Patient is continue on CPAP.  We will adjust CPAP pressure to 5-15cm H2O.  Patient is to get new supplies order has been sent.  And to have a mask fitting.  To see if we can help to decrease his leaks and height hopefully improve his overall control  Plan  Patient Instructions  Continue on CPAP At bedtime  .  Keep up good work .  Do not drive if sleepy .  Set up for mask fitting .  Change CPAP pressure to 5 to 15cm - we will take care of this.  Follow up with Dr. Elsworth Soho  In 1 year  CPAP download in 1 month and As needed

## 2017-08-15 NOTE — Progress Notes (Signed)
@Patient  ID: Joe Glover, male    DOB: 09/01/1924, 84 y.o.   MRN: [id number]  Chief Complaint  Patient presents with  . Follow-up    OSA     Referring provider: [name], MD  HPI: 84 year old male followed for obstructive sleep apnea Past medical history significant for hypertension and Parkinson's  TEST  NPSG 2011 >AHI 8/hr   08/15/2017 Follow up : OSA  Patient returns for one-year follow-up.  Patient has known sleep apnea.  Patient says he is doing well on CPAP.  He feels rested.  He wears his CPAP each night.  He gets in about 8-10 hours.  Download shows excellent compliance with average usage around 9-10 hours.  Patient has significant mask leaks.  AHI is 8.0.  Patient says he does not feel the leaks.   Allergies  Allergen Reactions  . Anacin [Aspirin]     GI bleed  . Augmentin [Amoxicillin-Pot Clavulanate]     hives  . Hemocyte Plus [Hematinic Plus Vit-Minerals] Other (See Comments)    unknown  . Penicillins     hives    Immunization History  Administered Date(s) Administered  . Influenza, High Dose Seasonal PF 05/08/2017  . Influenza,inj,Quad PF,6+ Mos 04/10/2015  . Influenza-Unspecified 04/07/2014  . Pneumococcal Conjugate-13 07/09/2015  . Pneumococcal Polysaccharide-23 06/14/2013    Past Medical History:  Diagnosis Date  . Awareness alteration, transient 11/02/2014  . Cellulitis of right hand 01/27/2017  . Colon cancer ([place])   . Colon polyp   . Essential hypertension 09/23/2012  . Fall 09/16/2016  . Fall from standing 02/25/2017  . GI bleed   . Hyperlipidemia   . Hypertension   . Hypokalemia 01/27/2017  . Hyponatremia 01/27/2017  . Insomnia 08/13/2016  . Normocytic anemia 01/27/2017  . On amiodarone therapy 08/06/2015  . Orthostatic hypotension 08/06/2015  . OSA (obstructive sleep apnea)   . PAF (paroxysmal atrial fibrillation) ([place])   . Parkinson disease ([place])   . Prostate cancer ([place])   . SDH (subdural hematoma) ([place]) 09/16/2016  . Status  post placement of implantable loop recorder 07/11/2015  . Stroke ([place])   . 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 Subdural hematoma ([place]) 09/05/2016  . Syncope 11/02/2014  . TIA (transient ischemic attack)   . Vitamin D deficiency     Tobacco History: Social History   Tobacco Use  Smoking Status Former Smoker  . Packs/day: 2.00  . Years: 20.00  . Pack years: 40.00  . Types: Cigarettes  . Last attempt to quit: 07/08/1958  . Years since quitting: 59.1  Smokeless Tobacco Never Used   Counseling given: Not Answered   Outpatient Encounter Medications as of 08/15/2017  Medication Sig  . Azelastine HCl 0.15 % SOLN Place 2 sprays into the nose 2 (two) times daily as needed (congestion).   . Cholecalciferol (VITAMIN D) 2000 UNITS CAPS Take 2,000 Units by mouth daily.   . cloNIDine (CATAPRES) 0.1 MG tablet Take 0.5 tablets by mouth daily as needed. For a systolic Bp > 144 mm Hg  . cyanocobalamin (,VITAMIN B-12,) 1000 MCG/ML injection Inject 1,000 mcg into the muscle every 30 (thirty) days.   [name] Kitchen EPINEPHrine 0.3 mg/0.3 mL IJ SOAJ injection Inject 0.3 mg into the muscle daily as needed (allergic reaction).  . ferrous sulfate 325 (65 FE) MG tablet Take 325 mg by mouth 2 (two) times daily with a meal.   .  loratadine (CLARITIN) 10 MG tablet Take 10 mg by mouth daily.  . magnesium oxide (MAG-OX) 400 MG tablet Take 800 mg by mouth 2 (two) times daily.   [name] Kitchen omeprazole (PRILOSEC) 20 MG capsule Take 20 mg by mouth daily.   . potassium chloride (K-DUR,KLOR-CON) 10 MEQ tablet Take 20 mEq by mouth daily.   . pravastatin (PRAVACHOL) 40 MG tablet Take 40 mg by mouth daily. Once daily  . Probiotic Product (PROBIOTIC DAILY PO) Take 1 tablet by mouth daily.   . psyllium (METAMUCIL) 58.6 % packet Take 1 packet by mouth 2 (two) times daily.   . ranitidine (ZANTAC) 300 MG tablet Take 300 mg by mouth at bedtime.   . temazepam  (RESTORIL) 15 MG capsule Take 15 mg by mouth at bedtime.   . triamcinolone ointment (KENALOG) 0.1 % APPLY A THIN LAYER TO RIGHT LEG THREE TIMES DAILY AS NEEDED   No facility-administered encounter medications on file as of 08/15/2017.      Review of Systems  Constitutional:   No  weight loss, night sweats,  Fevers, chills, + fatigue, or  lassitude.  HEENT:   No headaches,  Difficulty swallowing,  Tooth/dental problems, or  Sore throat,                No sneezing, itching, ear ache, nasal congestion, post nasal drip,   CV:  No chest pain,  Orthopnea, PND, swelling in lower extremities, anasarca, dizziness, palpitations, syncope.   GI  No heartburn, indigestion, abdominal pain, nausea, vomiting, diarrhea, change in bowel habits, loss of appetite, bloody stools.   Resp: No shortness of breath with exertion or at rest.  No excess mucus, no productive cough,  No non-productive cough,  No coughing up of blood.  No change in color of mucus.  No wheezing.  No chest wall deformity  Skin: no rash or lesions.  GU: no dysuria, change in color of urine, no urgency or frequency.  No flank pain, no hematuria   MS:  No joint pain or swelling.  No decreased range of motion.  No back pain.    Physical Exam  BP 118/64 (BP Location: Left Arm, Cuff Size: Normal)   Pulse 77   Ht 6' (1.829 m)   Wt 163 lb 3.2 oz (74 kg)   SpO2 99%   BMI 22.13 kg/m   GEN: A/Ox3; pleasant , NAD, well nourished    HEENT:  [place]/AT,  EACs-clear, TMs-wnl, NOSE-clear, THROAT-clear, no lesions, no postnasal drip or exudate noted. Class 2 MP airway   NECK:  Supple w/ fair ROM; no JVD; normal carotid impulses w/o bruits; no thyromegaly or nodules palpated; no lymphadenopathy.    RESP  Clear  P & A; w/o, wheezes/ rales/ or rhonchi. no accessory muscle use, no dullness to percussion  CARD:  RRR, no m/r/g, no peripheral edema, pulses intact, no cyanosis or clubbing.  GI:   Soft & nt; nml bowel sounds; no organomegaly or  masses detected.   Musco: Warm bil, no deformities or joint swelling noted.   Neuro: alert, no focal deficits noted.    Skin: Warm, no lesions or rashes    Lab Results:  CBC    Component Value Date/Time   WBC 3.1 (L) 01/28/2017 0329   RBC 3.78 (L) 01/28/2017 0329   HGB 10.2 (L) 01/28/2017 0329   HCT 30.2 (L) 01/28/2017 0329   PLT 203 01/28/2017 0329   MCV 79.9 01/28/2017 0329   MCH 27.0 01/28/2017 0329   MCHC  33.8 01/28/2017 0329   RDW 14.6 01/28/2017 0329   LYMPHSABS 0.3 (L) 01/27/2017 0345   MONOABS 0.3 01/27/2017 0345   EOSABS 0.1 01/27/2017 0345   BASOSABS 0.0 01/27/2017 0345    BMET    Component Value Date/Time   NA 129 (L) 01/28/2017 0329   K 3.3 (L) 01/28/2017 0329   CL 103 01/28/2017 0329   CO2 20 (L) 01/28/2017 0329   GLUCOSE 96 01/28/2017 0329   BUN 11 01/28/2017 0329   CREATININE 0.96 01/28/2017 0329   CALCIUM 7.7 (L) 01/28/2017 0329   GFRNONAA >60 01/28/2017 0329   GFRAA >60 01/28/2017 0329    BNP No results found for: BNP  ProBNP No results found for: PROBNP  Imaging: No results found.   Assessment & Plan:   OSA (obstructive sleep apnea) Patient is continue on CPAP.  We will adjust CPAP pressure to 5-15cm H2O.  Patient is to get new supplies order has been sent.  And to have a mask fitting.  To see if we can help to decrease his leaks and height hopefully improve his overall control  Plan  Patient Instructions  Continue on CPAP At bedtime  .  Keep up good work .  Do not drive if sleepy .  Set up for mask fitting .  Change CPAP pressure to 5 to 15cm - we will take care of this.  Follow up with Dr. Elsworth Soho  In 1 year  CPAP download in 1 month and As needed            Rexene Edison, NP 08/15/2017

## 2017-09-03 ENCOUNTER — Other Ambulatory Visit (HOSPITAL_BASED_OUTPATIENT_CLINIC_OR_DEPARTMENT_OTHER): Payer: Medicare Other

## 2017-09-12 ENCOUNTER — Ambulatory Visit (INDEPENDENT_AMBULATORY_CARE_PROVIDER_SITE_OTHER): Payer: Medicare Other | Admitting: Cardiology

## 2017-09-12 ENCOUNTER — Encounter: Payer: Self-pay | Admitting: Cardiology

## 2017-09-12 VITALS — BP 122/78 | HR 80 | Ht 72.0 in | Wt 161.0 lb

## 2017-09-12 DIAGNOSIS — I951 Orthostatic hypotension: Secondary | ICD-10-CM | POA: Diagnosis not present

## 2017-09-12 DIAGNOSIS — I480 Paroxysmal atrial fibrillation: Secondary | ICD-10-CM

## 2017-09-12 DIAGNOSIS — R55 Syncope and collapse: Secondary | ICD-10-CM

## 2017-09-12 DIAGNOSIS — I10 Essential (primary) hypertension: Secondary | ICD-10-CM

## 2017-09-12 NOTE — Progress Notes (Signed)
Cardiology Office Note:    Date:  09/12/2017   ID:  [name], DOB [birth year], MRN [id number]  PCP:  [name], MD  Cardiologist:  Shirlee More, MD    Referring MD: [name], MD    ASSESSMENT:    1. PAF (paroxysmal atrial fibrillation) ([place])   2. Orthostatic hypotension   3. Essential hypertension   4. Syncope, unspecified syncope type    PLAN:    In order of problems listed above:  1. Stable no recurrence on ILR interrogation in the office improved he has had no episodes of syncope 2. Stable with  minimal change in blood pressure today with support hose 3. Stable his wife is not needed to give him clonidine as needed 4. Improved no clinical recurrence   Next appointment: 6 months   Medication Adjustments/Labs and Tests Ordered: Current medicines are reviewed at length with the patient today.  Concerns regarding medicines are outlined above.  No orders of the defined types were placed in this encounter.  No orders of the defined types were placed in this encounter.   Chief Complaint  Patient presents with  . Follow-up    6 month flup appt     History of Present Illness:    Joe Glover is a 84 y.o. male with a hx of HTN, HLD, colon cancer (S/P colectomy), prostate cancer (S/P prostatectomy and orchiectomy), GERD, severe right knee OA, vascular Parkinsonism with baseline mild dysarthria, OSA on CPAP, paroxysmal A. fib (not on anticoagulation; prior GI bleed in 2014 requiring 16 units PRBCs) and recurrent syncopal episodes  last seen 6 months ago.  ASSESSMENT:   02/26/17   1. Syncope, unspecified syncope type   2. Essential hypertension   3. Orthostatic hypotension   4. PAF (paroxysmal atrial fibrillation) ([facility])   5. Parkinson disease ([place])    PLAN:    1. Continues to do poorly with 3 episodes of syncope in July on associated with arrhythmia. 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 He is taking a low dose  of clonidine I asked him to stop it and his wife will give it only for systolics greater than 409. He's had no palpitation chest pain or shortness of breath and has been seen by his PCP and neurology after these ED visits. He will start to use both elastic compressive hose as well as an abdominal binder to try to mitigate his autonomic insufficiency and prevent venous pooling. 2. Stable, the consequences Billee Cashing is of hypotension I weigh those of hypertension he'll take his antihypertensive agent clonidine only when necessary for systolics greater than 811 3. Continued episodes of syncope 4. Stable no clinical recurrence including loop recording 5. Stable managed by neurology  Compliance with diet, lifestyle and medications: Yes Unfortunately has become increasingly frail uses a walker and has difficulty with speech and swallowing.  He is using support hose and has had no recurrent syncopal episodes no palpitation.  We interrogated his ILR today he has had no episodes of bradycardia or atrial fibrillation.  His wife is not needed to give him clonidine for hypertensive urgency.  Recent labs requested from his PCP Past Medical History:  Diagnosis Date  . Awareness alteration, transient 11/02/2014  . Cellulitis of right hand 01/27/2017  . Colon cancer ([place])   . Colon polyp   . Essential hypertension 09/23/2012  . Fall 09/16/2016  . Fall from standing 02/25/2017  . GI bleed   . Hyperlipidemia   . Hypertension   .  Hypokalemia 01/27/2017  . Hyponatremia 01/27/2017  . Insomnia 08/13/2016  . Normocytic anemia 01/27/2017  . On amiodarone therapy 08/06/2015  . Orthostatic hypotension 08/06/2015  . OSA (obstructive sleep apnea)   . PAF (paroxysmal atrial fibrillation) ([place])   . Parkinson disease ([place])   . Prostate cancer ([place])   . SDH (subdural hematoma) ([place]) 09/16/2016  . Status post placement of implantable loop recorder 07/11/2015  . Stroke ([place])   . 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 Subdural hematoma ([place]) 09/05/2016  . Syncope 11/02/2014  . TIA (transient ischemic attack)   . Vitamin D deficiency     Past Surgical History:  Procedure Laterality Date  . COLECTOMY    . HERNIA REPAIR    . ORCHIECTOMY    . PROSTATECTOMY      Current Medications: Current Meds  Medication Sig  . Cholecalciferol (VITAMIN D) 2000 UNITS CAPS Take 2,000 Units by mouth daily.   . cloNIDine (CATAPRES) 0.1 MG tablet Take 0.5 tablets by mouth daily as needed. For a systolic Bp > 035 mm Hg  . cyanocobalamin (,VITAMIN B-12,) 1000 MCG/ML injection Inject 1,000 mcg into the muscle every 30 (thirty) days.   [name] Kitchen EPINEPHrine 0.3 mg/0.3 mL IJ SOAJ injection Inject 0.3 mg into the muscle daily as needed (allergic reaction).  . ferrous sulfate 325 (65 FE) MG tablet Take 325 mg by mouth 2 (two) times daily with a meal.   . ipratropium (ATROVENT) 0.03 % nasal spray Place 1 spray into the nose 2 (two) times daily.  [name] Kitchen loratadine (CLARITIN) 10 MG tablet Take 10 mg by mouth daily.  . magnesium oxide (MAG-OX) 400 MG tablet Take 800 mg by mouth 2 (two) times daily.   [name] Kitchen omeprazole (PRILOSEC) 20 MG capsule Take 20 mg by mouth daily.   . potassium chloride (K-DUR,KLOR-CON) 10 MEQ tablet Take 10 mEq by mouth 2 (two) times daily.   . pravastatin (PRAVACHOL) 40 MG tablet Take 40 mg by mouth daily. Once daily  . Probiotic Product (PROBIOTIC DAILY PO) Take 1 tablet by mouth daily.   . psyllium (METAMUCIL) 58.6 % packet Take 1 packet by mouth 2 (two) times daily.   . ranitidine (ZANTAC) 300 MG tablet Take 300 mg by mouth at bedtime.   . temazepam (RESTORIL) 15 MG capsule Take 15 mg by mouth at bedtime.   . triamcinolone ointment (KENALOG) 0.1 % APPLY A THIN LAYER TO RIGHT LEG THREE TIMES DAILY AS NEEDED     Allergies:   Anacin [aspirin]; Augmentin [amoxicillin-pot clavulanate]; Hemocyte plus [hematinic plus vit-minerals]; and Penicillins    Social History   Socioeconomic History  . Marital status: Married    Spouse name: None  . Number of children: 2  . Years of education: None  . Highest education level: None  Social Needs  . Financial resource strain: None  . Food insecurity - worry: None  . Food insecurity - inability: None  . Transportation needs - medical: None  . Transportation needs - non-medical: None  Occupational History  . Occupation: retired  Tobacco Use  . Smoking status: Former Smoker    Packs/day: 2.00    Years: 20.00    Pack years: 40.00    Types: Cigarettes    Last attempt to quit: 07/08/1958    Years since quitting: 59.2  . Smokeless tobacco: Never Used  Substance and Sexual Activity  . Alcohol use: Yes  Alcohol/week: 0.0 oz    Comment: occasional - 1-2 drinks a month  . Drug use: No  . Sexual activity: None  Other Topics Concern  . None  Social History Narrative  . None     Family History: The patient's family history includes Heart attack in his brother and father; Parkinson's disease in his mother. ROS:   Please see the history of present illness.    All other systems reviewed and are negative.  EKGs/Labs/Other Studies Reviewed:    The following studies were reviewed today:  ILR interrogation shows good battery status and no clinical arrhythmia  Recent Labs: 01/27/2017: Magnesium 1.3 01/28/2017: BUN 11; Creatinine, Ser 0.96; Hemoglobin 10.2; Platelets 203; Potassium 3.3; Sodium 129  Recent Lipid Panel No results found for: CHOL, TRIG, HDL, CHOLHDL, VLDL, LDLCALC, LDLDIRECT  Physical Exam:    VS:  BP 122/78 (BP Location: Right Arm, Patient Position: Sitting, Cuff Size: Normal)   Pulse 80   Ht 6' (1.829 m)   Wt 161 lb (73 kg)   SpO2 98%   BMI 21.84 kg/m     Wt Readings from Last 3 Encounters:  09/12/17 161 lb (73 kg)  08/15/17 163 lb 3.2 oz (74 kg)  02/26/17 161 lb 6.4 oz (73.2 kg)     GEN: Has lost weight poor muscle mass he appears quite frail in no acute  distress HEENT: Normal NECK: No JVD; No carotid bruits LYMPHATICS: No lymphadenopathy CARDIAC: RRR, no murmurs, rubs, gallops RESPIRATORY:  Clear to auscultation without rales, wheezing or rhonchi  ABDOMEN: Soft, non-tender, non-distended MUSCULOSKELETAL:  No edema; No deformity  SKIN: Warm and dry NEUROLOGIC:  Alert and oriented x 3 PSYCHIATRIC:  Normal affect    Signed, Shirlee More, MD  09/12/2017 3:35 PM    [facility]

## 2017-09-12 NOTE — Patient Instructions (Signed)

## 2017-09-23 ENCOUNTER — Ambulatory Visit (HOSPITAL_BASED_OUTPATIENT_CLINIC_OR_DEPARTMENT_OTHER): Payer: Medicare Other | Attending: Adult Health | Admitting: Radiology

## 2017-09-23 DIAGNOSIS — G4733 Obstructive sleep apnea (adult) (pediatric): Secondary | ICD-10-CM

## 2018-05-13 NOTE — Progress Notes (Signed)
Cardiology Office Note:    Date:  05/14/2018   ID:  Joe Glover, DOB 1925/02/06, MRN [id number]  PCP:  [name], MD  Cardiologist:  Shirlee More, MD    Referring MD: [name], MD    ASSESSMENT:    1. Bradycardia   2. Syncope, unspecified syncope type   3. PAF (paroxysmal atrial fibrillation) ([place])   4. Orthostatic hypotension    PLAN:    In order of problems listed above:  1. 14-day Zio monitor, I am suspicious that his sick sinus syndrome has progressed in a require pacemaker 2. No recent recurrence clinic clinically in the past has been due to hypotension due to Parkinson's 3. Stable no recurrence off amiodarone 4. Stable at home will allow permissive hypertension today falls and injury and he takes clonidine as needed for severe hypertension. 5. Lipidemia stable he takes a statin   Next appointment: 4 weeks   Medication Adjustments/Labs and Tests Ordered: Current medicines are reviewed at length with the patient today.  Concerns regarding medicines are outlined above.  Orders Placed This Encounter  Procedures  . LONG TERM MONITOR (3-14 DAYS)  . EKG 12-Lead   No orders of the defined types were placed in this encounter.   No chief complaint on file.   History of Present Illness:    Joe Glover is a 84 y.o. male with a hx of  HTN, HLD, colon cancer (S/P colectomy), prostate cancer (S/P prostatectomy and orchiectomy), GERD, severe right knee OA, vascular Parkinsonism with baseline mild dysarthria, OSA on CPAP, paroxysmal A. fib (not on anticoagulation; prior GI bleed in 2014 requiring 16 units PRBCs) and recurrent syncopal episodes  last seen 09/13/07.  His last loop recorder download 02/09/2018 shows brief episodes of atrial fibrillation.  He has been asked ERI since April 2019. Compliance with diet, lifestyle and medications: Yes he is supervised by his wife she brings a list of heart rates and blood pressure is a very often his resting  heart rate runs 40-50 at home.  He is in Dr. Marval Regal office heart rate of 40 unfortunately time an EKG was done sinus rhythm 65-70 today in my office he has frequent PVCs with bigeminy I do not know if he is having severe bradycardia and we need to consider a permanent pacemaker rather is having a low pulse due to ventricular arrhythmia and he has implanted loop recorder unfortunately no longer functions the battery is depleted.  We will place a 14-day event monitor and make a decision about the merits of a pacemaker he has a history of sick sinus syndrome paroxysmal atrial fibrillation with no clinical recurrence he is off amiodarone because of neurotoxicity fortunately has had no recurrence of syncope.  Orthostatic hypotension is stable. Past Medical History:  Diagnosis Date  . Awareness alteration, transient 11/02/2014  . Cellulitis of right hand 01/27/2017  . Colon cancer ([place])   . Colon polyp   . Essential hypertension 09/23/2012  . Fall 09/16/2016  . Fall from standing 02/25/2017  . GI bleed   . Hyperlipidemia   . Hypertension   . Hypokalemia 01/27/2017  . Hyponatremia 01/27/2017  . Insomnia 08/13/2016  . Normocytic anemia 01/27/2017  . On amiodarone therapy 08/06/2015  . Orthostatic hypotension 08/06/2015  . OSA (obstructive sleep apnea)   . PAF (paroxysmal atrial fibrillation) ([place])   . Parkinson disease ([place])   . Prostate cancer ([place])   . SDH (subdural hematoma) ([place]) 09/16/2016  . Status post placement of implantable  loop recorder 07/11/2015  . Stroke ([place])   . Subarachnoid hemorrhage from aneurysm of left middle cerebral artery ([place]) 07/11/2015   Overview:  Small (Dx with MRA 05-04-07)  . Subdural hematoma ([place]) 09/05/2016  . Syncope 11/02/2014  . TIA (transient ischemic attack)   . Vitamin D deficiency     Past Surgical History:  Procedure Laterality Date  . COLECTOMY    . HERNIA REPAIR    . ORCHIECTOMY    . PROSTATECTOMY      Current Medications: Current Meds  Medication Sig  .  cloNIDine (CATAPRES) 0.1 MG tablet Take 0.5 tablets by mouth daily as needed. For a systolic Bp > 275 mm Hg  . cyanocobalamin (,VITAMIN B-12,) 1000 MCG/ML injection Inject 1,000 mcg into the muscle every 30 (thirty) days.   [name] Kitchen EPINEPHrine 0.3 mg/0.3 mL IJ SOAJ injection Inject 0.3 mg into the muscle daily as needed (allergic reaction).  . ferrous sulfate 325 (65 FE) MG tablet Take 325 mg by mouth 2 (two) times daily with a meal.   . ipratropium (ATROVENT) 0.03 % nasal spray Place 1 spray into the nose 2 (two) times daily.  [name] Kitchen loratadine (CLARITIN) 10 MG tablet Take 10 mg by mouth daily.  . magnesium oxide (MAG-OX) 400 MG tablet Take 800 mg by mouth 2 (two) times daily.   [name] Kitchen omeprazole (PRILOSEC) 20 MG capsule Take 20 mg by mouth daily.   . potassium chloride (K-DUR,KLOR-CON) 10 MEQ tablet Take 10 mEq by mouth daily.   . pravastatin (PRAVACHOL) 40 MG tablet Take 40 mg by mouth daily. Once daily  . Probiotic Product (PROBIOTIC DAILY PO) Take 1 tablet by mouth daily.   . psyllium (METAMUCIL) 58.6 % packet Take 1 packet by mouth 2 (two) times daily.   . ranitidine (ZANTAC) 300 MG tablet Take 300 mg by mouth at bedtime.   . temazepam (RESTORIL) 15 MG capsule Take 15 mg by mouth at bedtime.   [name] Kitchen VITAMIN D PO Take 5,000 Units by mouth daily.      Allergies:   Anacin [aspirin]; Augmentin [amoxicillin-pot clavulanate]; Hemocyte plus [hematinic plus vit-minerals]; and Penicillins   Social History   Socioeconomic History  . Marital status: Married    Spouse name: Not on file  . Number of children: 2  . Years of education: Not on file  . Highest education level: Not on file  Occupational History  . Occupation: retired  Scientific laboratory technician  . Financial resource strain: Not on file  . Food insecurity:    Worry: Not on file    Inability: Not on file  . Transportation needs:    Medical: Not on file    Non-medical: Not on file  Tobacco Use  . Smoking status: Former Smoker    Packs/day: 2.00    Years:  20.00    Pack years: 40.00    Types: Cigarettes    Last attempt to quit: 07/08/1958    Years since quitting: 59.8  . Smokeless tobacco: Never Used  Substance and Sexual Activity  . Alcohol use: Yes    Alcohol/week: 0.0 standard drinks    Comment: occasional - 1-2 drinks a month  . Drug use: No  . Sexual activity: Not on file  Lifestyle  . Physical activity:    Days per week: Not on file    Minutes per session: Not on file  . Stress: Not on file  Relationships  . Social connections:    Talks on phone: Not on file  Gets together: Not on file    Attends religious service: Not on file    Active member of club or organization: Not on file    Attends meetings of clubs or organizations: Not on file    Relationship status: Not on file  Other Topics Concern  . Not on file  Social History Narrative  . Not on file     Family History: The patient's family history includes Heart attack in his brother and father; Parkinson's disease in his mother. ROS:   Please see the history of present illness.    All other systems reviewed and are negative.  EKGs/Labs/Other Studies Reviewed:    The following studies were reviewed today:  EKG:  EKG ordered today.  The ekg ordered today demonstrates sinus rhythm bigeminy nonspecific ST-T  Recent Labs: Recent labs PCP office CBC is normal CMP is normal cholesterol 90 LDL 37 No results found for requested labs within last 8760 hours.  Recent Lipid Panel No results found for: CHOL, TRIG, HDL, CHOLHDL, VLDL, LDLCALC, LDLDIRECT  Physical Exam:    VS:  BP (!) 148/86 (BP Location: Right Arm, Patient Position: Sitting, Cuff Size: Normal)   Pulse 87   Ht 6' (1.829 m)   Wt 157 lb (71.2 kg)   SpO2 95%   BMI 21.29 kg/m     Wt Readings from Last 3 Encounters:  05/14/18 157 lb (71.2 kg)  09/12/17 161 lb (73 kg)  08/15/17 163 lb 3.2 oz (74 kg)     GEN: Very frail slow in motion parkinsonian well nourished, well developed in no acute  distress HEENT: Normal NECK: No JVD; No carotid bruits LYMPHATICS: No lymphadenopathy CARDIAC: RRR, no murmurs, rubs, gallops RESPIRATORY:  Clear to auscultation without rales, wheezing or rhonchi  ABDOMEN: Soft, non-tender, non-distended MUSCULOSKELETAL:  No edema; No deformity  SKIN: Warm and dry NEUROLOGIC:  Alert and oriented x 3 PSYCHIATRIC:  Normal affect    Signed, Shirlee More, MD  05/14/2018 2:42 PM    [facility]

## 2018-05-14 ENCOUNTER — Ambulatory Visit (INDEPENDENT_AMBULATORY_CARE_PROVIDER_SITE_OTHER): Payer: Medicare Other | Admitting: Cardiology

## 2018-05-14 VITALS — BP 148/86 | HR 87 | Ht 72.0 in | Wt 157.0 lb

## 2018-05-14 DIAGNOSIS — R55 Syncope and collapse: Secondary | ICD-10-CM | POA: Diagnosis not present

## 2018-05-14 DIAGNOSIS — I480 Paroxysmal atrial fibrillation: Secondary | ICD-10-CM | POA: Diagnosis not present

## 2018-05-14 DIAGNOSIS — R001 Bradycardia, unspecified: Secondary | ICD-10-CM | POA: Diagnosis not present

## 2018-05-14 DIAGNOSIS — I951 Orthostatic hypotension: Secondary | ICD-10-CM

## 2018-05-14 NOTE — Patient Instructions (Signed)
Medication Instructions:  Your physician recommends that you continue on your current medications as directed. Please refer to the Current Medication list given to you today.  If you need a refill on your cardiac medications before your next appointment, please call your pharmacy.   Lab work: None  If you have labs (blood work) drawn today and your tests are completely normal, you will receive your results only by: [name] Kitchen MyChart Message (if you have MyChart) OR . A paper copy in the mail If you have any lab test that is abnormal or we need to change your treatment, we will call you to review the results.  Testing/Procedures: You had an EKG today.   Your physician has recommended that you wear a ZIO patch monitor. ZIO patch monitors are medical devices that record the heart's electrical activity. Doctors most often use these monitors to diagnose arrhythmias. Arrhythmias are problems with the speed or rhythm of the heartbeat. The monitor is a small, portable device. You can wear one while you do your normal daily activities. This is usually used to diagnose what is causing palpitations/syncope (passing out). Wear for 14 days.   Follow-Up: At [facility], you and your health needs are our priority.  As part of our continuing mission to provide you with exceptional heart care, we have created designated Provider Care Teams.  These Care Teams include your primary Cardiologist (physician) and Advanced Practice Providers (APPs -  Physician Assistants and Nurse Practitioners) who all work together to provide you with the care you need, when you need it. You will need a follow up appointment in 4 weeks.

## 2018-05-22 ENCOUNTER — Ambulatory Visit (INDEPENDENT_AMBULATORY_CARE_PROVIDER_SITE_OTHER): Payer: Medicare Other

## 2018-05-22 DIAGNOSIS — I480 Paroxysmal atrial fibrillation: Secondary | ICD-10-CM | POA: Diagnosis not present

## 2018-05-22 DIAGNOSIS — R001 Bradycardia, unspecified: Secondary | ICD-10-CM

## 2018-05-22 DIAGNOSIS — R55 Syncope and collapse: Secondary | ICD-10-CM | POA: Diagnosis not present

## 2018-05-31 ENCOUNTER — Emergency Department (HOSPITAL_COMMUNITY): Payer: Medicare Other

## 2018-05-31 ENCOUNTER — Other Ambulatory Visit: Payer: Self-pay

## 2018-05-31 ENCOUNTER — Observation Stay (HOSPITAL_COMMUNITY)
Admission: EM | Admit: 2018-05-31 | Discharge: 2018-06-02 | Disposition: A | Discharge status: Home / Self Care | Payer: Medicare Other | Attending: Internal Medicine | Admitting: Internal Medicine

## 2018-05-31 ENCOUNTER — Encounter (HOSPITAL_COMMUNITY): Payer: Self-pay | Admitting: Emergency Medicine

## 2018-05-31 DIAGNOSIS — Z79899 Other long term (current) drug therapy: Secondary | ICD-10-CM | POA: Insufficient documentation

## 2018-05-31 DIAGNOSIS — G20 Parkinson's disease: Secondary | ICD-10-CM | POA: Insufficient documentation

## 2018-05-31 DIAGNOSIS — E860 Dehydration: Secondary | ICD-10-CM | POA: Diagnosis not present

## 2018-05-31 DIAGNOSIS — Z87891 Personal history of nicotine dependence: Secondary | ICD-10-CM | POA: Insufficient documentation

## 2018-05-31 DIAGNOSIS — R55 Syncope and collapse: Principal | ICD-10-CM | POA: Diagnosis present

## 2018-05-31 DIAGNOSIS — R001 Bradycardia, unspecified: Secondary | ICD-10-CM

## 2018-05-31 DIAGNOSIS — Z8673 Personal history of transient ischemic attack (TIA), and cerebral infarction without residual deficits: Secondary | ICD-10-CM | POA: Diagnosis not present

## 2018-05-31 DIAGNOSIS — I10 Essential (primary) hypertension: Secondary | ICD-10-CM | POA: Insufficient documentation

## 2018-05-31 DIAGNOSIS — E611 Iron deficiency: Secondary | ICD-10-CM | POA: Insufficient documentation

## 2018-05-31 DIAGNOSIS — Z85038 Personal history of other malignant neoplasm of large intestine: Secondary | ICD-10-CM | POA: Insufficient documentation

## 2018-05-31 DIAGNOSIS — E871 Hypo-osmolality and hyponatremia: Secondary | ICD-10-CM | POA: Insufficient documentation

## 2018-05-31 DIAGNOSIS — E785 Hyperlipidemia, unspecified: Secondary | ICD-10-CM | POA: Diagnosis not present

## 2018-05-31 DIAGNOSIS — D509 Iron deficiency anemia, unspecified: Secondary | ICD-10-CM | POA: Diagnosis not present

## 2018-05-31 LAB — CBC WITH DIFFERENTIAL/PLATELET
Abs Immature Granulocytes: 0.02 10*3/uL (ref 0.00–0.07)
Basophils Absolute: 0 10*3/uL (ref 0.0–0.1)
Basophils Relative: 1 %
EOS PCT: 2 %
Eosinophils Absolute: 0.1 10*3/uL (ref 0.0–0.5)
HEMATOCRIT: 39.3 % (ref 39.0–52.0)
HEMOGLOBIN: 12.8 g/dL — AB (ref 13.0–17.0)
Immature Granulocytes: 0 %
LYMPHS ABS: 0.9 10*3/uL (ref 0.7–4.0)
LYMPHS PCT: 18 %
MCH: 27.2 pg (ref 26.0–34.0)
MCHC: 32.6 g/dL (ref 30.0–36.0)
MCV: 83.6 fL (ref 80.0–100.0)
MONOS PCT: 14 %
Monocytes Absolute: 0.7 10*3/uL (ref 0.1–1.0)
NRBC: 0 % (ref 0.0–0.2)
Neutro Abs: 3.3 10*3/uL (ref 1.7–7.7)
Neutrophils Relative %: 65 %
Platelets: 184 10*3/uL (ref 150–400)
RBC: 4.7 MIL/uL (ref 4.22–5.81)
RDW: 14.6 % (ref 11.5–15.5)
WBC: 5 10*3/uL (ref 4.0–10.5)

## 2018-05-31 LAB — PROTIME-INR
INR: 1
PROTHROMBIN TIME: 13.1 s (ref 11.4–15.2)

## 2018-05-31 LAB — BASIC METABOLIC PANEL
Anion gap: 7 (ref 5–15)
BUN: 12 mg/dL (ref 8–23)
CALCIUM: 8.3 mg/dL — AB (ref 8.9–10.3)
CHLORIDE: 95 mmol/L — AB (ref 98–111)
CO2: 24 mmol/L (ref 22–32)
Creatinine, Ser: 0.95 mg/dL (ref 0.61–1.24)
GFR calc non Af Amer: >60 mL/min (ref >60)
GLUCOSE: 104 mg/dL — AB (ref 70–99)
Potassium: 4.5 mmol/L (ref 3.5–5.1)
Sodium: 126 mmol/L — ABNORMAL LOW (ref 135–145)

## 2018-05-31 LAB — I-STAT TROPONIN, ED: Troponin i, poc: 0 ng/mL (ref 0.00–0.08)

## 2018-05-31 LAB — BRAIN NATRIURETIC PEPTIDE: B Natriuretic Peptide: 206.7 pg/mL — ABNORMAL HIGH (ref 0.0–100.0)

## 2018-05-31 MED ORDER — VITAMIN D 50 MCG (2000 UT) PO TABS: 2000.0000 [IU] | ORAL_TABLET | ORAL | Status: DC

## 2018-05-31 MED ORDER — VITAMIN D 25 MCG (1000 UNIT) PO TABS
3000.0000 [IU] | ORAL_TABLET | Freq: Every day | ORAL | Status: DC
  Administered 2018-06-01 – 2018-06-02 (×2): 3000 [IU] via ORAL
  Filled 2018-05-31 (×2): qty 3

## 2018-05-31 MED ORDER — ACETAMINOPHEN 325 MG PO TABS
650.0000 mg | ORAL_TABLET | Freq: Four times a day (QID) | ORAL | Status: DC | PRN
  Administered 2018-06-02: 650 mg via ORAL
  Filled 2018-05-31: qty 2

## 2018-05-31 MED ORDER — ACETAMINOPHEN 500 MG PO TABS: 500.0000 mg | ORAL_TABLET | Freq: Four times a day (QID) | ORAL | Status: DC | PRN

## 2018-05-31 MED ORDER — ONDANSETRON HCL 4 MG/2ML IJ SOLN: 4.0000 mg | Freq: Four times a day (QID) | INTRAMUSCULAR | Status: DC | PRN

## 2018-05-31 MED ORDER — TEMAZEPAM 15 MG PO CAPS
15.0000 mg | ORAL_CAPSULE | Freq: Once | ORAL | Status: AC
  Administered 2018-05-31: 15 mg via ORAL
  Filled 2018-05-31: qty 1

## 2018-05-31 MED ORDER — ACETAMINOPHEN 650 MG RE SUPP: 650.0000 mg | Freq: Four times a day (QID) | RECTAL | Status: DC | PRN

## 2018-05-31 MED ORDER — ENOXAPARIN SODIUM 40 MG/0.4ML ~~LOC~~ SOLN
40.0000 mg | SUBCUTANEOUS | Status: DC
  Administered 2018-05-31 – 2018-06-01 (×2): 40 mg via SUBCUTANEOUS
  Filled 2018-05-31 (×2): qty 0.4

## 2018-05-31 MED ORDER — VITAMIN D 25 MCG (1000 UNIT) PO TABS
2000.0000 [IU] | ORAL_TABLET | Freq: Every day | ORAL | Status: DC
  Administered 2018-05-31 – 2018-06-01 (×2): 2000 [IU] via ORAL
  Filled 2018-05-31 (×2): qty 2

## 2018-05-31 MED ORDER — CYANOCOBALAMIN 1000 MCG/ML IJ SOLN: 1000.0000 ug | INTRAMUSCULAR | Status: DC

## 2018-05-31 MED ORDER — PSYLLIUM 95 % PO PACK
1.0000 | PACK | Freq: Two times a day (BID) | ORAL | Status: DC
  Administered 2018-05-31 – 2018-06-02 (×4): 1 via ORAL
  Filled 2018-05-31 (×4): qty 1

## 2018-05-31 MED ORDER — FERROUS SULFATE 325 (65 FE) MG PO TABS
325.0000 mg | ORAL_TABLET | Freq: Two times a day (BID) | ORAL | Status: DC
  Administered 2018-05-31 – 2018-06-02 (×4): 325 mg via ORAL
  Filled 2018-05-31 (×4): qty 1

## 2018-05-31 MED ORDER — MAGNESIUM OXIDE 400 (241.3 MG) MG PO TABS
800.0000 mg | ORAL_TABLET | Freq: Two times a day (BID) | ORAL | Status: DC
  Administered 2018-05-31 – 2018-06-02 (×4): 800 mg via ORAL
  Filled 2018-05-31 (×4): qty 2

## 2018-05-31 MED ORDER — ONDANSETRON HCL 4 MG PO TABS: 4.0000 mg | ORAL_TABLET | Freq: Four times a day (QID) | ORAL | Status: DC | PRN

## 2018-05-31 MED ORDER — PANTOPRAZOLE SODIUM 40 MG PO TBEC
40.0000 mg | DELAYED_RELEASE_TABLET | Freq: Every day | ORAL | Status: DC
  Administered 2018-06-01 – 2018-06-02 (×2): 40 mg via ORAL
  Filled 2018-05-31 (×2): qty 1

## 2018-05-31 MED ORDER — PRAVASTATIN SODIUM 40 MG PO TABS
40.0000 mg | ORAL_TABLET | Freq: Every day | ORAL | Status: DC
  Administered 2018-05-31 – 2018-06-01 (×2): 40 mg via ORAL
  Filled 2018-05-31 (×2): qty 1

## 2018-05-31 MED ORDER — SODIUM CHLORIDE 0.9 % IV SOLN
INTRAVENOUS | Status: DC
  Administered 2018-05-31 – 2018-06-01 (×2): via INTRAVENOUS

## 2018-05-31 MED ORDER — RISAQUAD PO CAPS
1.0000 | ORAL_CAPSULE | Freq: Every day | ORAL | Status: DC
  Administered 2018-05-31 – 2018-06-01 (×2): 1 via ORAL
  Filled 2018-05-31 (×2): qty 1

## 2018-05-31 NOTE — Progress Notes (Signed)
Patient arrived to 3w-19 at 1745 A&O X 4  Patient states no pain Family at bedside  Abrasion on right lateral leg, red scratch from fall on left skin, MASD, skin breakdown on genitalia  Will continue to monitor

## 2018-05-31 NOTE — ED Notes (Signed)
Attempted phone call to floor to give report, receiving RN is Merleen Nicely and can be reached at [phone]. States she will call back to take report in mins

## 2018-05-31 NOTE — ED Notes (Signed)
Captured another EKG for bigeminy rhythm, gave to EDP. Pt denies pain or symptoms.

## 2018-05-31 NOTE — ED Notes (Signed)
All belongings given to wife at bedside

## 2018-05-31 NOTE — H&P (Signed)
History and Physical    Joe Glover NLZ:[id number] DOB: 31-Jan-1925 DOA: 05/31/2018  PCP: [name], MD   Patient coming from: Home   Chief Complaint: Syncope  HPI: Joe Glover is a 84 y.o. male with medical history significant of bradycardia, suspected sick sinus syndrome, paroxysmal atrial fibrillation, orthostatic hypotension, hypertension, dyslipidemia, history of prostate and colon cancer, GERD, Parkinson's disease, and history of gastrointestinal bleed. Patient presented after a syncope episode, he was at church standing for about 5 minutes when he suddenly collapsed to the floor, no prodromal symptoms, no head trauma.  No loss of continence, tonic-clonic movements or tongue biting.  Patient was unresponsive for about 30 minutes, positive cyanosis and a weak pulse.  His eyes were open, but he had a blank stare, was not interactive.  EMS was called and patient was brought to the hospital.  By the time he reached the emergency department he was back to his baseline.  Patient has been seen by the cardiology clinic May 14, 2018, for bradycardia, heart rate 40 and 50, a 14-day monitor was placed to rule out cardiac syncope.    ED Course: He was noted to hemodynamically stable, suspected cardiac related syncope, referred for admission for evaluation.  Review of Systems:  1. General: No fevers, no chills, no weight gain or weight loss 2. ENT: No runny nose or sore throat, no hearing disturbances 3. Pulmonary: No dyspnea, cough, wheezing, or hemoptysis 4. Cardiovascular: No angina, claudication, lower extremity edema, pnd or orthopnea 5. Gastrointestinal: No nausea or vomiting, no diarrhea or constipation 6. Hematology: No easy bruisability or frequent infections 7. Urology: No dysuria, hematuria or increased urinary frequency 8. Dermatology: No rashes. 9. Neurology: No seizures or paresthesias 10. Musculoskeletal: No joint pain or deformities  Past Medical History:    Diagnosis Date  . Awareness alteration, transient 11/02/2014  . Cellulitis of right hand 01/27/2017  . Colon cancer ([place])   . Colon polyp   . Essential hypertension 09/23/2012  . Fall 09/16/2016  . Fall from standing 02/25/2017  . GI bleed   . Hyperlipidemia   . Hypertension   . Hypokalemia 01/27/2017  . Hyponatremia 01/27/2017  . Insomnia 08/13/2016  . Normocytic anemia 01/27/2017  . On amiodarone therapy 08/06/2015  . Orthostatic hypotension 08/06/2015  . OSA (obstructive sleep apnea)   . PAF (paroxysmal atrial fibrillation) ([place])   . Parkinson disease ([place])   . Prostate cancer ([place])   . SDH (subdural hematoma) ([place]) 09/16/2016  . Status post placement of implantable loop recorder 07/11/2015  . Stroke ([place])   . 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 Subdural hematoma ([place]) 09/05/2016  . Syncope 11/02/2014  . TIA (transient ischemic attack)   . Vitamin D deficiency     Past Surgical History:  Procedure Laterality Date  . COLECTOMY    . HERNIA REPAIR    . ORCHIECTOMY    . PROSTATECTOMY       reports that he quit smoking about 59 years ago. His smoking use included cigarettes. He has a 40.00 pack-year smoking history. He has never used smokeless tobacco. He reports that he drinks alcohol. He reports that he does not use drugs.  Allergies  Allergen Reactions  . Anacin [Aspirin] Other (See Comments)    GI bleed  . Augmentin [Amoxicillin-Pot Clavulanate] Hives  . Hemocyte Plus [Hematinic Plus Vit-Minerals] Other (See Comments)    Unknown reaction  .  Penicillins Hives    Has patient had a PCN reaction causing immediate Glover, facial/tongue/throat swelling, SOB or lightheadedness with hypotension: No Has patient had a PCN reaction causing severe Glover involving mucus membranes or skin necrosis: Yes Has patient had a PCN reaction that required hospitalization: No Has patient had a PCN reaction  occurring within the last 10 years: No If all of the above answers are "NO", then may proceed with Cephalosporin use.    Family History  Problem Relation Age of Onset  . Parkinson's disease Mother   . Heart attack Father   . Heart attack Brother      Prior to Admission medications   Medication Sig Start Date End Date Taking? Authorizing Provider  acetaminophen (TYLENOL) 500 MG tablet Take 500 mg by mouth every 6 (six) hours as needed for headache (pain).   Yes [provider]  Cholecalciferol (VITAMIN D) 50 MCG (2000 UT) tablet Take 2,000-3,000 Units by mouth See admin instructions. Take 1 1/2 tablets (3000 units) by mouth every morning and 1 tablet (2000 unit) at night   Yes [provider]  cloNIDine (CATAPRES) 0.1 MG tablet Take 0.05 mg by mouth daily as needed (systolic BP >242).  12/30/16  Yes [provider]  cyanocobalamin (,VITAMIN B-12,) 1000 MCG/ML injection Inject 1,000 mcg into the muscle every 30 (thirty) days.    Yes [provider]  EPINEPHrine 0.3 mg/0.3 mL IJ SOAJ injection Inject 0.3 mg into the muscle daily as needed (allergic reaction).   Yes [provider]  ferrous sulfate 325 (65 FE) MG tablet Take 325 mg by mouth 2 (two) times daily with a meal.    Yes [provider]  ipratropium (ATROVENT) 0.03 % nasal spray Place 1 spray into the nose 2 (two) times daily. 08/20/17  Yes [provider]  loratadine (CLARITIN) 10 MG tablet Take 10 mg by mouth at bedtime.    Yes [provider]  magnesium oxide (MAG-OX) 400 MG tablet Take 800 mg by mouth 2 (two) times daily.    Yes [provider]  omeprazole (PRILOSEC) 20 MG capsule Take 20 mg by mouth daily.  04/26/14  Yes [provider]  OnabotulinumtoxinA (BOTOX IJ) Inject as directed every 3 (three) months. Next injection due in December 2019   Yes [provider]  potassium chloride (K-DUR,KLOR-CON) 10 MEQ tablet Take 10 mEq by mouth  daily.  10/20/14  Yes [provider]  pravastatin (PRAVACHOL) 40 MG tablet Take 40 mg by mouth at bedtime.  05/07/14  Yes [provider]  [place] into the lungs at bedtime. CPAP   Yes [provider]  Probiotic Product (PROBIOTIC DAILY PO) Take 1 tablet by mouth at bedtime.    Yes [provider]  psyllium (METAMUCIL) 58.6 % packet Take 1 packet by mouth 2 (two) times daily.    Yes [provider]  ranitidine (ZANTAC) 300 MG tablet Take 300 mg by mouth at bedtime.  04/26/14  Yes [provider]  temazepam (RESTORIL) 15 MG capsule Take 15 mg by mouth at bedtime.  04/26/14  Yes [provider]  triamcinolone ointment (KENALOG) 0.1 % Apply 1 application topically 3 (three) times daily as needed (Glover).   Yes [provider]    Physical Exam: Vitals:   05/31/18 1530 05/31/18 1545 05/31/18 1600 05/31/18 1615  BP: (!) 148/100 (!) 150/94 (!) 144/97 (!) 158/105  Pulse: 82 (!) 43 79 (!) 40  Resp: 14 12 14  14  Temp:      SpO2: 98% 97% 97% 97%  Weight:      Height:        Vitals:   05/31/18 1530 05/31/18 1545 05/31/18 1600 05/31/18 1615  BP: (!) 148/100 (!) 150/94 (!) 144/97 (!) 158/105  Pulse: 82 (!) 43 79 (!) 40  Resp: 14 12 14 14   Temp:      SpO2: 98% 97% 97% 97%  Weight:      Height:       General: Not in pain or dyspnea, deconditioned  Neurology: Awake and alert, non focal Head and Neck. Head normocephalic. Neck supple with no adenopathy or thyromegaly.   E ENT: mild pallor, no icterus, oral mucosa dry.  Cardiovascular: No JVD. S1-S2 present, rhythmic, no gallops, rubs, or murmurs. No lower extremity edema. Pulmonary: positive breath sounds bilaterally, adequate air movement, no wheezing, rhonchi or rales. Gastrointestinal. Abdomen with, no organomegaly, non tender, no rebound or guarding Skin. No rashes Musculoskeletal: no joint deformities    Labs on Admission: I have personally  reviewed following labs and imaging studies  CBC: Recent Labs  Lab 05/31/18 1357  WBC 5.0  NEUTROABS 3.3  HGB 12.8*  HCT 39.3  MCV 83.6  PLT 657   Basic Metabolic Panel: Recent Labs  Lab 05/31/18 1357  NA 126*  K 4.5  CL 95*  CO2 24  GLUCOSE 104*  BUN 12  CREATININE 0.95  CALCIUM 8.3*   GFR: Estimated Creatinine Clearance: 50.9 mL/min (by C-G formula based on SCr of 0.95 mg/dL). Liver Function Tests: No results for input(s): AST, ALT, ALKPHOS, BILITOT, PROT, ALBUMIN in the last 168 hours. No results for input(s): LIPASE, AMYLASE in the last 168 hours. No results for input(s): AMMONIA in the last 168 hours. Coagulation Profile: Recent Labs  Lab 05/31/18 1357  INR 1.00   Cardiac Enzymes: No results for input(s): CKTOTAL, CKMB, CKMBINDEX, TROPONINI in the last 168 hours. BNP (last 3 results) No results for input(s): PROBNP in the last 8760 hours. HbA1C: No results for input(s): HGBA1C in the last 72 hours. CBG: No results for input(s): GLUCAP in the last 168 hours. Lipid Profile: No results for input(s): CHOL, HDL, LDLCALC, TRIG, CHOLHDL, LDLDIRECT in the last 72 hours. Thyroid Function Tests: No results for input(s): TSH, T4TOTAL, FREET4, T3FREE, THYROIDAB in the last 72 hours. Anemia Panel: No results for input(s): VITAMINB12, FOLATE, FERRITIN, TIBC, IRON, RETICCTPCT in the last 72 hours. Urine analysis:    Component Value Date/Time   COLORURINE YELLOW 01/27/2017 0021   APPEARANCEUR CLEAR 01/27/2017 0021   LABSPEC 1.006 01/27/2017 0021   PHURINE 6.0 01/27/2017 0021   GLUCOSEU NEGATIVE 01/27/2017 0021   HGBUR NEGATIVE 01/27/2017 0021   BILIRUBINUR NEGATIVE 01/27/2017 0021   KETONESUR NEGATIVE 01/27/2017 0021   PROTEINUR NEGATIVE 01/27/2017 0021   UROBILINOGEN 0.2 10/10/2014 1146   NITRITE NEGATIVE 01/27/2017 0021   LEUKOCYTESUR NEGATIVE 01/27/2017 0021    Radiological Exams on Admission: Dg Chest Port 1 View  Result Date: 05/31/2018 CLINICAL  DATA:  84 year old male with history of syncope. Loss of consciousness for 1-2 minutes. EXAM: PORTABLE CHEST 1 VIEW COMPARISON:  Chest x-ray 01/27/2017. FINDINGS: Lung volumes are low. Mild diffuse interstitial prominence which appears to be chronic and is similar to prior examinations. No consolidative airspace disease. No pleural effusions. No pneumothorax. No pulmonary nodule or mass noted. Pulmonary vasculature and the cardiomediastinal silhouette are within normal limits. Aortic atherosclerosis. Electronic device is projecting over the left hemithorax likely to represent implantable  loop recorder is. IMPRESSION: 1. Low lung volumes without radiographic evidence of acute cardiopulmonary disease. 2. Aortic atherosclerosis. Electronically Signed   By: Vinnie Langton M.D.   On: 05/31/2018 14:45    EKG: Independently reviewed.  Normal sinus rhythm, left axis deviation, poor R wave progression, normal intervals.  Assessment/Plan Active Problems:   Syncope  83 year old male who presented with recurrent syncope episode.  This time occurred while standing, he was about 5 minutes on his feet before the event, no prodromal symptoms. Patient was noted to be cyanotic, pulse was weak and he was unresponsive for about 30 minutes.  No features of seizure activity.  On his initial physical examination blood pressure 150/94 heart rate 43-79, respiratory rate 12, oxygen saturation 97%.  Dry mucous membranes, lungs clear to auscultation, heart S1-S2 present rhythmic, abdomen soft, no lower extremity edema.  Sodium 126, potassium 4.5, chloride 95, bicarb 24, glucose 104, BUN 12, creatinine 0.95, white count 5.0, hemoglobin 12.8, hematocrit 39.3, platelets 184.  Chest radiograph, hypoinflated, no infiltrates.  Patient was admitted to the hospital with a working diagnosis of syncope, ruke out cardiac in origin.  1.  Syncope.  Patient will be admitted to the medical ward, he will be placed on a remote telemetry monitor,  will consult cardiology to access information from percutaneous heart monitor.  Physical therapy evaluation, orthostatic vital signs, gentle hydration with isotonic saline.   2.  Hypertension.  At home patient taking clonidine as needed, continue blood pressure monitoring, gentle isotonic intravenous fluids.  3.  Dyslipidemia.  Continue pravastatin.  4.  Hyponatremia.  Likely hypoosmolar hyponatremia, will hydrate patient with isotonic saline intravenously, follow-up kidney function morning.  5.  Iron deficiency.  Continue iron supplementation.  DVT prophylaxis: enoxaparin  Code Status: full  Family Communication: I spoke with patient's family at the bedside and all questions were addressed.   Disposition Plan: home   Consults called: cardiology   Admission status: Observation    Danielle Lento Gerome Apley MD [facility] Pager [phone]  If 7PM-7AM, please contact night-coverage [email or web address] Password [facility]  05/31/2018, 4:29 PM

## 2018-05-31 NOTE — ED Provider Notes (Signed)
[facility] EMERGENCY DEPARTMENT Provider Note   CSN: [id number] Arrival date & time: 05/31/18  1228     History   Chief Complaint Chief Complaint  Patient presents with  . Loss of Consciousness    HPI Joe Glover is a 84 y.o. male.  HPI Patient was at church.  He was standing seeing him when he had a loss of consciousness.  His wife was beside him and caught him so he did not fall or have injury.  She reports that he was however very pale in appearance.  He did continue to breathe and there was an EMT present who verified pulses.  She reports that he was pale and gray in appearance.  He did not really come back around for almost 30 minutes.  Patient denies being aware of any symptoms before this occurred.  He is currently wearing a event monitor due to brief syncopal episodes.  His wife reports that she has never seen him do anything like this.  He has been well recently without fever, chills, vomiting, diarrhea.  He felt well going to church this morning.  He denies any chest pain or shortness of breath at this time. Past Medical History:  Diagnosis Date  . Awareness alteration, transient 11/02/2014  . Cellulitis of right hand 01/27/2017  . Colon cancer ([place])   . Colon polyp   . Essential hypertension 09/23/2012  . Fall 09/16/2016  . Fall from standing 02/25/2017  . GI bleed   . Hyperlipidemia   . Hypertension   . Hypokalemia 01/27/2017  . Hyponatremia 01/27/2017  . Insomnia 08/13/2016  . Normocytic anemia 01/27/2017  . On amiodarone therapy 08/06/2015  . Orthostatic hypotension 08/06/2015  . OSA (obstructive sleep apnea)   . PAF (paroxysmal atrial fibrillation) ([place])   . Parkinson disease ([place])   . Prostate cancer ([place])   . SDH (subdural hematoma) ([place]) 09/16/2016  . Status post placement of implantable loop recorder 07/11/2015  . Stroke ([place])   . 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 Subdural hematoma ([place]) 09/05/2016  . Syncope 11/02/2014  . TIA (transient ischemic attack)   . Vitamin D deficiency     Patient Active Problem List   Diagnosis Date Noted  . Bradycardia 05/14/2018  . Fall from standing 02/25/2017  . Parkinson disease ([place]) 01/27/2017  . Hyponatremia 01/27/2017  . Hypokalemia 01/27/2017  . Cellulitis of right hand 01/27/2017  . Normocytic anemia 01/27/2017  . Fall 09/16/2016  . SDH (subdural hematoma) ([place]) 09/16/2016  . Subdural hematoma ([place]) 09/05/2016  . Insomnia 08/13/2016  . On amiodarone therapy 08/06/2015  . Orthostatic hypotension 08/06/2015  . PAF (paroxysmal atrial fibrillation) ([place]) 08/06/2015  . Status post placement of implantable loop recorder 07/11/2015  . 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 Syncope 11/02/2014  . Awareness alteration, transient 11/02/2014  . OSA (obstructive sleep apnea) 05/31/2014  . Essential hypertension 09/23/2012  . Prostate cancer ([place]) 09/23/2012    Past Surgical History:  Procedure Laterality Date  . COLECTOMY    . HERNIA REPAIR    . ORCHIECTOMY    . PROSTATECTOMY          Home Medications    Prior to Admission medications   Medication Sig Start Date End Date Taking? Authorizing Provider  acetaminophen (TYLENOL) 500 MG tablet Take 500 mg by mouth every 6 (six) hours as  needed for headache (pain).   Yes [provider]  Cholecalciferol (VITAMIN D) 50 MCG (2000 UT) tablet Take 2,000-3,000 Units by mouth See admin instructions. Take 1 1/2 tablets (3000 units) by mouth every morning and 1 tablet (2000 unit) at night   Yes [provider]  cloNIDine (CATAPRES) 0.1 MG tablet Take 0.05 mg by mouth daily as needed (systolic BP >017).  12/30/16  Yes [provider]  cyanocobalamin (,VITAMIN B-12,) 1000 MCG/ML injection Inject 1,000 mcg into the muscle every 30 (thirty) days.    Yes [provider]    EPINEPHrine 0.3 mg/0.3 mL IJ SOAJ injection Inject 0.3 mg into the muscle daily as needed (allergic reaction).   Yes [provider]  ferrous sulfate 325 (65 FE) MG tablet Take 325 mg by mouth 2 (two) times daily with a meal.    Yes [provider]  ipratropium (ATROVENT) 0.03 % nasal spray Place 1 spray into the nose 2 (two) times daily. 08/20/17  Yes [provider]  loratadine (CLARITIN) 10 MG tablet Take 10 mg by mouth at bedtime.    Yes [provider]  magnesium oxide (MAG-OX) 400 MG tablet Take 800 mg by mouth 2 (two) times daily.    Yes [provider]  omeprazole (PRILOSEC) 20 MG capsule Take 20 mg by mouth daily.  04/26/14  Yes [provider]  OnabotulinumtoxinA (BOTOX IJ) Inject as directed every 3 (three) months. Next injection due in December 2019   Yes [provider]  potassium chloride (K-DUR,KLOR-CON) 10 MEQ tablet Take 10 mEq by mouth daily.  10/20/14  Yes [provider]  pravastatin (PRAVACHOL) 40 MG tablet Take 40 mg by mouth at bedtime.  05/07/14  Yes [provider]  [place] into the lungs at bedtime. CPAP   Yes [provider]  Probiotic Product (PROBIOTIC DAILY PO) Take 1 tablet by mouth at bedtime.    Yes [provider]  psyllium (METAMUCIL) 58.6 % packet Take 1 packet by mouth 2 (two) times daily.    Yes [provider]  ranitidine (ZANTAC) 300 MG tablet Take 300 mg by mouth at bedtime.  04/26/14  Yes [provider]  temazepam (RESTORIL) 15 MG capsule Take 15 mg by mouth at bedtime.  04/26/14  Yes [provider]  triamcinolone ointment (KENALOG) 0.1 % Apply 1 application topically 3 (three) times daily as needed (rash).   Yes [provider]    Family History Family History  Problem Relation Age of Onset  . Parkinson's disease Mother   . Heart attack Father   . Heart attack Brother     Social  History Social History   Tobacco Use  . Smoking status: Former Smoker    Packs/day: 2.00    Years: 20.00    Pack years: 40.00    Types: Cigarettes    Last attempt to quit: 07/08/1958    Years since quitting: 59.9  . Smokeless tobacco: Never Used  Substance Use Topics  . Alcohol use: Yes    Alcohol/week: 0.0 standard drinks    Comment: occasional - 1-2 drinks a month  . Drug use: No     Allergies   Anacin [aspirin]; Augmentin [amoxicillin-pot clavulanate]; Hemocyte plus [hematinic plus vit-minerals]; and Penicillins   Review of Systems Review of Systems 10 Systems reviewed and are negative for acute change except as noted in the HPI.   Physical Exam Updated Vital Signs BP (!) 158/105   Pulse (!) 40  Temp 98.9 F (37.2 C)   Resp 14   Ht 6' (1.829 m)   Wt 71 kg   SpO2 97%   BMI 21.23 kg/m   Physical Exam  Constitutional: He is oriented to person, place, and time. He appears well-developed and well-nourished. No distress.  HENT:  Head: Normocephalic and atraumatic.  Eyes: EOM are normal.  Cardiovascular: Normal rate, regular rhythm, normal heart sounds and intact distal pulses.  Pulmonary/Chest: Effort normal and breath sounds normal.  Abdominal: Soft. He exhibits no distension. There is no tenderness. There is no guarding.  Musculoskeletal: Normal range of motion. He exhibits no edema or tenderness.  Neurological: He is alert and oriented to person, place, and time. No cranial nerve deficit or sensory deficit. He exhibits normal muscle tone. Coordination normal.  Patient is alert and appropriate.  Speech is clear.  Cognitive function normal.  Grip strength 5\5.  Lower extremity strength 5\5.  Skin: Skin is warm and dry.  Psychiatric: He has a normal mood and affect.     ED Treatments / Results  Labs (all labs ordered are listed, but only abnormal results are displayed) Labs Reviewed  BASIC METABOLIC PANEL - Abnormal; Notable for the following components:       Result Value   Sodium 126 (*)    Chloride 95 (*)    Glucose, Bld 104 (*)    Calcium 8.3 (*)    All other components within normal limits  CBC WITH DIFFERENTIAL/PLATELET - Abnormal; Notable for the following components:   Hemoglobin 12.8 (*)    All other components within normal limits  BRAIN NATRIURETIC PEPTIDE - Abnormal; Notable for the following components:   B Natriuretic Peptide 206.7 (*)    All other components within normal limits  PROTIME-INR  I-STAT TROPONIN, ED    EKG EKG Interpretation  Date/Time:  Sunday May 31 2018 12:36:38 EST Ventricular Rate:  75 PR Interval:    QRS Duration: 125 QT Interval:  436 QTC Calculation: 487 R Axis:   -68 Text Interpretation:  Sinus rhythm no STEMI no sig change from previous Confirmed by Charlesetta Shanks [phone]) on 05/31/2018 1:47:59 PM   Radiology Dg Chest Port 1 View  Result Date: 05/31/2018 CLINICAL DATA:  84 year old male with history of syncope. Loss of consciousness for 1-2 minutes. EXAM: PORTABLE CHEST 1 VIEW COMPARISON:  Chest x-ray 01/27/2017. FINDINGS: Lung volumes are low. Mild diffuse interstitial prominence which appears to be chronic and is similar to prior examinations. No consolidative airspace disease. No pleural effusions. No pneumothorax. No pulmonary nodule or mass noted. Pulmonary vasculature and the cardiomediastinal silhouette are within normal limits. Aortic atherosclerosis. Electronic device is projecting over the left hemithorax likely to represent implantable loop recorder is. IMPRESSION: 1. Low lung volumes without radiographic evidence of acute cardiopulmonary disease. 2. Aortic atherosclerosis. Electronically Signed   By: Vinnie Langton M.D.   On: 05/31/2018 14:45    Procedures Procedures (including critical care time)  Medications Ordered in ED Medications - No data to display   Initial Impression / Assessment and Plan / ED Course  I have reviewed the triage vital signs and the nursing  notes.  Pertinent labs & imaging results that were available during my care of the patient were reviewed by me and considered in my medical decision making (see chart for details).  Clinical Course as of Jun 01 1635  Nancy Fetter May 31, 2018  1627 Consult: Reviewed with Dr. Debara Pick[facility].  He reports that the [employer] cannot be interrogated without  submitting it for review.  Advises can monitor overnight and determine based on results whether or not more emergent diagnostic or interventions are required.   [MP]  [id number] Consult: Tried hospitalist for admission.   [MP]    Clinical Course User Index [MP] Charlesetta Shanks, MD  Patient has been well with negative review of systems.  He is wearing a monitor for presyncopal episodes.  Today he had a prolonged episode of being poorly responsive with poor color but reportedly not pulseless.  This may represent severe sinus bradycardia or other dysrhythmia.  EMR review indicates patient is wearing monitor for suspicion of possible sick sinus syndrome.  Patient is being evaluated for potential pacemaker placement by cardiology.  Will have the patient admitted for continuous monitoring and observation.  At this time he is in excellent condition.  He is alert and nontoxic.  Vital signs are stable.  Final Clinical Impressions(s) / ED Diagnoses   Final diagnoses:  Syncope and collapse    ED Discharge Orders    None       Charlesetta Shanks, MD 05/31/18 [phone]

## 2018-05-31 NOTE — ED Notes (Signed)
Xray bedside.

## 2018-05-31 NOTE — ED Notes (Signed)
ED Provider at bedside. 

## 2018-05-31 NOTE — ED Triage Notes (Signed)
[place], Pt picked up from church due to LOC for 1-35mins per wife.  Denies fall or head injury. Holter monitor in place due to recent syncopal episodes and irregular heart beat. Hx of A-fib. Speech slurred at baseline.

## 2018-06-01 ENCOUNTER — Encounter (HOSPITAL_COMMUNITY): Payer: Self-pay | Admitting: Cardiology

## 2018-06-01 DIAGNOSIS — I10 Essential (primary) hypertension: Secondary | ICD-10-CM | POA: Diagnosis not present

## 2018-06-01 DIAGNOSIS — E871 Hypo-osmolality and hyponatremia: Secondary | ICD-10-CM

## 2018-06-01 DIAGNOSIS — R55 Syncope and collapse: Secondary | ICD-10-CM

## 2018-06-01 LAB — BASIC METABOLIC PANEL
ANION GAP: 6 (ref 5–15)
BUN: 10 mg/dL (ref 8–23)
CHLORIDE: 99 mmol/L (ref 98–111)
CO2: 26 mmol/L (ref 22–32)
Calcium: 8.2 mg/dL — ABNORMAL LOW (ref 8.9–10.3)
Creatinine, Ser: 0.79 mg/dL (ref 0.61–1.24)
GFR calc non Af Amer: >60 mL/min (ref >60)
Glucose, Bld: 91 mg/dL (ref 70–99)
POTASSIUM: 4.3 mmol/L (ref 3.5–5.1)
Sodium: 131 mmol/L — ABNORMAL LOW (ref 135–145)

## 2018-06-01 MED ORDER — TEMAZEPAM 15 MG PO CAPS
15.0000 mg | ORAL_CAPSULE | Freq: Every evening | ORAL | Status: DC | PRN
  Administered 2018-06-01: 15 mg via ORAL
  Filled 2018-06-01: qty 1

## 2018-06-01 NOTE — Care Management Note (Signed)
Case Management Note  Patient Details  Name: Joe Glover MRN: [id number] Date of Birth: 04-Aug-1924  Subjective/Objective:    Pt in with syncope. He is from home with his spouse. Pt was active with [facility] for outpatient therapy prior to admission.  Pt has no issues with transportation or obtaining his medications.                Action/Plan: CM consulted for Outpatient therapy. Wife would like him to continue at [facility]. CM called [facility] and will fax them the new orders.  Wife to arrange for transport home when medically ready.   Expected Discharge Date:                  Expected Discharge Plan:  OP Rehab  In-House Referral:     Discharge planning Services  CM Consult  Post Acute Care Choice:    Choice offered to:     DME Arranged:    DME Agency:     HH Arranged:    [place] Agency:     Status of Service:  Completed, signed off  If discussed at [employer] of Stay Meetings, dates discussed:    Additional Comments:  Pollie Friar, RN 06/01/2018, 12:33 PM

## 2018-06-01 NOTE — Evaluation (Signed)
Physical Therapy Evaluation Patient Details Name: Joe Glover MRN: [id number] DOB: 11/22/1924 Today's Date: 06/01/2018   History of Present Illness  Pt is a 84 y.o. male admitted 05/31/18 after a syncopal episode while prolonged standing at church, unresponsive for ~30 minutes. PMH includes bradycardia, sick sinus syndrome, a-fib, orthostatic hypotension, HTN, CA, Parkinson's disease.    Clinical Impression  Pt presents with an overall decrease in functional mobility secondary to above. PTA, pt ambulatory with RW or rollator; wife assists with ADLs. Today, pt ambulatory with RW and supervision for safety. Asymptomatic throughout session (see vitals below). Pt currently working with OP PT 1x/wk for strength and balance. Pt would benefit from continued acute PT services to maximize functional mobility and independence prior to d/c with continued OP PT services.   Orthostatic BPs  Supine 138/90  Sitting 133/91  Standing 140/99  Standing after 2 min 135/80  Post-ambulation 125/78  Return to supine 155/106      Follow Up Recommendations Outpatient PT;Supervision for mobility/OOB    Equipment Recommendations  None recommended by PT    Recommendations for Other Services       Precautions / Restrictions Precautions Precautions: Fall Restrictions Weight Bearing Restrictions: No      Mobility  Bed Mobility Overal bed mobility: Independent                Transfers Overall transfer level: Needs assistance Equipment used: Rolling walker (2 wheeled) Transfers: Sit to/from Stand Sit to Stand: Supervision            Ambulation/Gait Ambulation/Gait assistance: Supervision Gait Distance (Feet): 100 Feet Assistive device: Rolling walker (2 wheeled) Gait Pattern/deviations: Shuffle;Trunk flexed Gait velocity: Decreased Gait velocity interpretation: 1.31 - 2.62 ft/sec, indicative of limited community ambulator General Gait Details: Slow, shuffling ambulation with RW  and supervision for safety; posture with slight trunk, hip, and knee flexion  Stairs            Wheelchair Mobility    Modified Rankin (Stroke Patients Only)       Balance Overall balance assessment: Needs assistance   Sitting balance-Leahy Scale: Good     Standing balance support: Single extremity supported;Bilateral upper extremity supported Standing balance-Leahy Scale: Poor Standing balance comment: Reliant on single UE support with standing                             Pertinent Vitals/Pain Pain Assessment: No/denies pain    Home Living Family/patient expects to be discharged to:: Private residence Living Arrangements: Spouse/significant other Available Help at Discharge: Family;Available 24 hours/day Type of Home: House Home Access: Level entry     Home Layout: Multi-level Home Equipment: Walker - 2 wheels;Cane - single point;Bedside commode;Grab bars - tub/shower;Shower seat;Hand held shower head;Grab bars - toilet Additional Comments: Has a RW on every level    Prior Function Level of Independence: Needs assistance   Gait / Transfers Assistance Needed: Wife provides 24/7 supervision. Household amb with RW (have device on every level); uses rollator in community. Wife drives. Wife always present for stairs providing cues to "pick up feet"  ADL's / Homemaking Assistance Needed: Wife assists with bathing and dressing; pt indep with toileting  Comments: Works 1x/wk with outpatient PT     Hand Dominance        Extremity/Trunk Assessment   Upper Extremity Assessment Upper Extremity Assessment: Overall WFL for tasks assessed    Lower Extremity Assessment Lower Extremity Assessment: Overall WFL for tasks  assessed    Cervical / Trunk Assessment Cervical / Trunk Assessment: Kyphotic  Communication   Communication: Other (comment)(slurred speech)  Cognition Arousal/Alertness: Awake/alert Behavior During Therapy: WFL for tasks  assessed/performed Overall Cognitive Status: Within Functional Limits for tasks assessed                                 General Comments: WFL for simple tasks      General Comments General comments (skin integrity, edema, etc.): Wife present and supportive    Exercises     Assessment/Plan    PT Assessment Patient needs continued PT services  PT Problem List Decreased strength;Decreased activity tolerance;Decreased balance;Decreased mobility;Cardiopulmonary status limiting activity       PT Treatment Interventions DME instruction;Gait training;Stair training;Functional mobility training;Therapeutic activities;Therapeutic exercise;Balance training;Patient/family education;Neuromuscular re-education    PT Goals (Current goals can be found in the Care Plan section)  Acute Rehab PT Goals Patient Stated Goal: Remain living at home as long as possible PT Goal Formulation: With patient/family Time For Goal Achievement: 06/15/18 Potential to Achieve Goals: Good    Frequency Min 3X/week   Barriers to discharge        Co-evaluation               AM-PAC PT "6 Clicks" Mobility  Outcome Measure Help needed turning from your back to your side while in a flat bed without using bedrails?: None Help needed moving from lying on your back to sitting on the side of a flat bed without using bedrails?: None Help needed moving to and from a bed to a chair (including a wheelchair)?: A Little Help needed standing up from a chair using your arms (e.g., wheelchair or bedside chair)?: A Little Help needed to walk in hospital room?: A Little Help needed climbing 3-5 steps with a railing? : A Little 6 Click Score: 20    End of Session Equipment Utilized During Treatment: Gait belt Activity Tolerance: Patient tolerated treatment well Patient left: in bed;with call bell/phone within reach;with bed alarm set;with family/visitor present Nurse Communication: Mobility status PT  Visit Diagnosis: Other abnormalities of gait and mobility (R26.89);Difficulty in walking, not elsewhere classified (R26.2)    Time: 6333-5456 PT Time Calculation (min) (ACUTE ONLY): 25 min   Charges:   PT Evaluation $PT Eval Moderate Complexity: 1 Mod PT Treatments $Gait Training: 8-22 mins      Mabeline Caras, PT, DPT Acute Rehabilitation Services  Pager [phone] Office [place] 06/01/2018, 9:09 AM

## 2018-06-01 NOTE — Progress Notes (Signed)
PROGRESS NOTE    Joe Glover  WCH:[id number] DOB: [birth year] DOA: 05/31/2018 PCP: [name], MD    Brief Narrative:  84 year old male who presented with recurrent syncope episode.  This time occurred while standing, he was about 5 minutes on his feet before the event, no prodromal symptoms. Patient was noted to be cyanotic, pulse was weak and he was unresponsive for about 30 minutes.  No features of seizure activity.  On his initial physical examination blood pressure 150/94 heart rate 43-79, respiratory rate 12, oxygen saturation 97%.  Dry mucous membranes, lungs clear to auscultation, heart S1-S2 present rhythmic, abdomen soft, no lower extremity edema.  Sodium 126, potassium 4.5, chloride 95, bicarb 24, glucose 104, BUN 12, creatinine 0.95, white count 5.0, hemoglobin 12.8, hematocrit 39.3, platelets 184.  Chest radiograph, hypoinflated, no infiltrates. EKG with normal sinus rhythm, left axis deviation, poor R wave progression, normal intervals, positive PVCs  Patient was admitted to the hospital with a working diagnosis of syncope, rule out cardiac in origin.   Assessment & Plan:   Active Problems:   Syncope   1.  Syncope. Cardiology has been contacted to obtained information from percutaneous heart monitor, to rule out cardiac induced syncope. Patient has chronic bradycardia and suspected sick sinus syndrome. Will follow with cardiology recommendations. Per his wife patient is functional at home, able to do his routine activities of daily life. Physical therapy recommends outpatient physical therapy. Continue inpatient telemetry monitoring.   2.  Hypertension.  Blood pressure 353 to 614 systolic, will continue close monitoring, no tight control due to recurrent syncope. Orthostatic vital signs negative for hypotension.    3.  Dyslipidemia.  Tolerating well pravastatin.  4.  Hyponatremia.  Serum sodium has improved to 131, with preserved renal function, serum cr at  0.79, K at 4,3. Patient tolerating po well, will hold on further IV fluids.   5.  Iron deficiency.  On iron supplements.    DVT prophylaxis: enoxaparin   Code Status: full Family Communication: I spoke with patient's wife at the bedside and all questions were addressed.   Disposition Plan/ discharge barriers:  Pending final cardiology recommendations.   Body mass index is 21.23 kg/m. Malnutrition Type:      Malnutrition Characteristics:      Nutrition Interventions:     RN Pressure Injury Documentation:     Consultants:   Cardiology   Procedures:     Antimicrobials:       Subjective: Patient is feeling better, no recurrent syncope, no chest pain or dyspnea, no nausea or vomiting and tolerating po well.   Objective: Vitals:   05/31/18 2314 06/01/18 0355 06/01/18 0924 06/01/18 1153  BP: (!) 173/99 (!) 147/90 (!) 140/99 (!) 152/102  Pulse: (!) 50  73 72  Resp: 16 18 17 16   Temp: 98.3 F (36.8 C) (!) 97.5 F (36.4 C) 97.6 F (36.4 C) 97.7 F (36.5 C)  TempSrc: Oral Axillary Oral Oral  SpO2: 93% 100% 98% 98%  Weight:      Height:        Intake/Output Summary (Last 24 hours) at 06/01/2018 1242 Last data filed at 05/31/2018 1821 Gross per 24 hour  Intake 0 ml  Output 250 ml  Net -250 ml   Filed Weights   05/31/18 1244  Weight: 71 kg    Examination:   General: Not in pain or dyspnea.  Neurology: Awake and alert, non focal  E ENT: no pallor, no icterus, oral mucosa moist Cardiovascular: No  JVD. S1-S2 present, rhythmic, no gallops, rubs, or murmurs. No lower extremity edema. Pulmonary: positive breath sounds bilaterally, adequate air movement, no wheezing, rhonchi or rales. Gastrointestinal. Abdomen with no organomegaly, non tender, no rebound or guarding Skin. No rashes Musculoskeletal: no joint deformities     Data Reviewed: I have personally reviewed following labs and imaging studies  CBC: Recent Labs  Lab 05/31/18 1357  WBC  5.0  NEUTROABS 3.3  HGB 12.8*  HCT 39.3  MCV 83.6  PLT 373   Basic Metabolic Panel: Recent Labs  Lab 05/31/18 1357 06/01/18 0551  NA 126* 131*  K 4.5 4.3  CL 95* 99  CO2 24 26  GLUCOSE 104* 91  BUN 12 10  CREATININE 0.95 0.79  CALCIUM 8.3* 8.2*   GFR: Estimated Creatinine Clearance: 60.4 mL/min (by C-G formula based on SCr of 0.79 mg/dL). Liver Function Tests: No results for input(s): AST, ALT, ALKPHOS, BILITOT, PROT, ALBUMIN in the last 168 hours. No results for input(s): LIPASE, AMYLASE in the last 168 hours. No results for input(s): AMMONIA in the last 168 hours. Coagulation Profile: Recent Labs  Lab 05/31/18 1357  INR 1.00   Cardiac Enzymes: No results for input(s): CKTOTAL, CKMB, CKMBINDEX, TROPONINI in the last 168 hours. BNP (last 3 results) No results for input(s): PROBNP in the last 8760 hours. HbA1C: No results for input(s): HGBA1C in the last 72 hours. CBG: No results for input(s): GLUCAP in the last 168 hours. Lipid Profile: No results for input(s): CHOL, HDL, LDLCALC, TRIG, CHOLHDL, LDLDIRECT in the last 72 hours. Thyroid Function Tests: No results for input(s): TSH, T4TOTAL, FREET4, T3FREE, THYROIDAB in the last 72 hours. Anemia Panel: No results for input(s): VITAMINB12, FOLATE, FERRITIN, TIBC, IRON, RETICCTPCT in the last 72 hours.    Radiology Studies: I have reviewed all of the imaging during this hospital visit personally     Scheduled Meds: . acidophilus  1 capsule Oral QHS  . cholecalciferol  3,000 Units Oral Daily   And  . cholecalciferol  2,000 Units Oral QHS  . [START ON 06/08/2018] cyanocobalamin  1,000 mcg Intramuscular Q30 days  . enoxaparin (LOVENOX) injection  40 mg Subcutaneous Q24H  . ferrous sulfate  325 mg Oral BID WC  . magnesium oxide  800 mg Oral BID  . pantoprazole  40 mg Oral Daily  . pravastatin  40 mg Oral QHS  . psyllium  1 packet Oral BID   Continuous Infusions: . sodium chloride 75 mL/hr at 06/01/18 0748       LOS: 0 days        Tawni Millers, MD [facility] Pager [phone]

## 2018-06-01 NOTE — Consult Note (Addendum)
Cardiology Consultation:   Patient ID: Benito Lemmerman MRN: [id number]; DOB: 01/24/1925  Admit date: 05/31/2018 Date of Consult: 06/01/2018  Primary Care Provider: [name], MD Primary Cardiologist: Dr. Loney Hering    Patient Profile:   Kyvon Hu is a 84 y.o. male with a hx of autonomic dysfunction, recurrent syncope, status post implantable loop recorder with ERI April 2019, paroxysmal atrial fibrillation who is not on anticoagulation due to prior GI bleed, subdural hematoma, hypertension, Parkinson disease, colon cancer (S/P colectomy), prostate cancer (S/P prostatectomy and orchiectomy) and hyperlipidemia who is being seen today for the evaluation of syncope at the request of Dr. Cathlean Sauer.  Patient with longstanding history of recurrent syncope with no specific etiology.  No significant event noted even on loop recorder.  Last echocardiogram September 2018 showed normal LV function of 55 to 71%, grade 1 diastolic dysfunction, indetermiante LV filling pressure, aortic valve sclerosis with trivial AI, trivial MR, normal LA size, trivial TR, RVSP 20 mmHg, normal IVC.  Per note in Care EveryWhere 02/09/18:  Device Findings: Battery OK at [facility] since April 2019; 8 Tachy: Long > 3 min at 158 bpm; 3 AF for 0.3% of time.  Recently seen by Dr. [name] 05/14/18 after noted bradycardic at 40 at Dr. Marval Regal office.  However EKG noted sinus rhythm at rate of 65 to 70 bpm with frequent PVC during evaluation with Dr. [name].  Currently wearing 14 days ZIO monitor to rule out sick sinus syndrome given history of paroxysmal atrial fibrillation.  History of Present Illness:   Mr. Berns admitted yesterday after syncope event.  He was standing at church for about 5 minutes and then suddenly passed out. Caught by wife who standing beside him.  Did not hit the floor.  Per wife, he was unresponsive for 30 minutes.  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  He was brought to ER for further  evaluation.  He was back to his baseline upon arrival.  Patient lives with wife at home.  Able to do some household chores.  Denies chest pain, palpitation, orthopnea, PND, lower extremity edema or melena.  Point-of-care troponin negative.  BNP 206.  Electrolyte and serum creatinine normal.  EKG shows sinus rhythm at rate of 80 bpm with frequent PVC-personally reviewed.  Review of telemetry showed baseline rhythm of sinus rhythm at rate of 70s with frequent PVCs and bigeminy.  Past Medical History:  Diagnosis Date  . Awareness alteration, transient 11/02/2014  . Cellulitis of right hand 01/27/2017  . Colon cancer ([place])   . Essential hypertension 09/23/2012  . Fall 09/16/2016  . Fall from standing 02/25/2017  . GI bleed   . Hyperlipidemia   . Hypertension   . Insomnia 08/13/2016  . Normocytic anemia 01/27/2017  . On amiodarone therapy 08/06/2015  . Orthostatic hypotension 08/06/2015  . OSA (obstructive sleep apnea)   . PAF (paroxysmal atrial fibrillation) ([place])   . Parkinson disease ([place])   . Prostate cancer ([place])   . SDH (subdural hematoma) ([place]) 09/16/2016  . Status post placement of implantable loop recorder 07/11/2015  . Stroke ([place])   . 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 Subdural hematoma ([place]) 09/05/2016  . Syncope 11/02/2014  . TIA (transient ischemic attack)   . Vitamin D deficiency     Past Surgical History:  Procedure Laterality Date  . COLECTOMY    . HERNIA REPAIR    . ORCHIECTOMY    .  PROSTATECTOMY       Inpatient Medications: Scheduled Meds: . acidophilus  1 capsule Oral QHS  . cholecalciferol  3,000 Units Oral Daily   And  . cholecalciferol  2,000 Units Oral QHS  . [START ON 06/08/2018] cyanocobalamin  1,000 mcg Intramuscular Q30 days  . enoxaparin (LOVENOX) injection  40 mg Subcutaneous Q24H  . ferrous sulfate  325 mg Oral BID WC  . magnesium oxide  800 mg Oral BID  .  pantoprazole  40 mg Oral Daily  . pravastatin  40 mg Oral QHS  . psyllium  1 packet Oral BID   Continuous Infusions:  PRN Meds: acetaminophen **OR** acetaminophen, ondansetron **OR** ondansetron (ZOFRAN) IV  Allergies:    Allergies  Allergen Reactions  . Anacin [Aspirin] Other (See Comments)    GI bleed  . Augmentin [Amoxicillin-Pot Clavulanate] Hives  . Hemocyte Plus [Hematinic Plus Vit-Minerals] Other (See Comments)    Unknown reaction  . Penicillins Hives    Has patient had a PCN reaction causing immediate rash, facial/tongue/throat swelling, SOB or lightheadedness with hypotension: No Has patient had a PCN reaction causing severe rash involving mucus membranes or skin necrosis: Yes Has patient had a PCN reaction that required hospitalization: No Has patient had a PCN reaction occurring within the last 10 years: No If all of the above answers are "NO", then may proceed with Cephalosporin use.    Social History:   Social History   Socioeconomic History  . Marital status: Married    Spouse name: Not on file  . Number of children: 2  . Years of education: Not on file  . Highest education level: Not on file  Occupational History  . Occupation: retired  Scientific laboratory technician  . Financial resource strain: Not on file  . Food insecurity:    Worry: Not on file    Inability: Not on file  . Transportation needs:    Medical: Not on file    Non-medical: Not on file  Tobacco Use  . Smoking status: Former Smoker    Packs/day: 2.00    Years: 20.00    Pack years: 40.00    Types: Cigarettes    Last attempt to quit: 07/08/1958    Years since quitting: 59.9  . Smokeless tobacco: Never Used  Substance and Sexual Activity  . Alcohol use: Yes    Alcohol/week: 0.0 standard drinks    Comment: occasional - 1-2 drinks a month  . Drug use: No  . Sexual activity: Not on file  Lifestyle  . Physical activity:    Days per week: Not on file    Minutes per session: Not on file  . Stress: Not on  file  Relationships  . Social connections:    Talks on phone: Not on file    Gets together: Not on file    Attends religious service: Not on file    Active member of club or organization: Not on file    Attends meetings of clubs or organizations: Not on file    Relationship status: Not on file  . Intimate partner violence:    Fear of current or ex partner: Not on file    Emotionally abused: Not on file    Physically abused: Not on file    Forced sexual activity: Not on file  Other Topics Concern  . Not on file  Social History Narrative  . Not on file    Family History:   Family History  Problem Relation Age of Onset  .  Parkinson's disease Mother   . Heart attack Father   . Heart attack Brother      ROS:  Please see the history of present illness.  All other ROS reviewed and negative.     Physical Exam/Data:   Vitals:   05/31/18 2314 06/01/18 0355 06/01/18 0924 06/01/18 1153  BP: (!) 173/99 (!) 147/90 (!) 140/99 (!) 152/102  Pulse: (!) 50  73 72  Resp: 16 18 17 16   Temp: 98.3 F (36.8 C) (!) 97.5 F (36.4 C) 97.6 F (36.4 C) 97.7 F (36.5 C)  TempSrc: Oral Axillary Oral Oral  SpO2: 93% 100% 98% 98%  Weight:      Height:        Intake/Output Summary (Last 24 hours) at 06/01/2018 1456 Last data filed at 05/31/2018 1821 Gross per 24 hour  Intake 0 ml  Output 250 ml  Net -250 ml   Filed Weights   05/31/18 1244  Weight: 71 kg   Body mass index is 21.23 kg/m.  General: Chronically ill-appearing pale male in no acute distress  HEENT: normal Lymph: no adenopathy Neck: no JVD Endocrine:  No thryomegaly Vascular: No carotid bruits; FA pulses 2+ bilaterally without bruits  Cardiac:  normal S1, S2; RRR; no murmur  Lungs:  clear to auscultation bilaterally, no wheezing, rhonchi or rales  Abd: soft, nontender, no hepatomegaly  Ext: no edema Musculoskeletal:  No deformities, BUE and BLE strength normal and equal Skin: warm and dry  Neuro:  CNs 2-12 intact, no  focal abnormalities noted Psych:  Normal affect   Relevant CV Studies: As summarized above  Laboratory Data:  Chemistry Recent Labs  Lab 05/31/18 1357 06/01/18 0551  NA 126* 131*  K 4.5 4.3  CL 95* 99  CO2 24 26  GLUCOSE 104* 91  BUN 12 10  CREATININE 0.95 0.79  CALCIUM 8.3* 8.2*  GFRNONAA >60 >60  GFRAA >60 >60  ANIONGAP 7 6    No results for input(s): PROT, ALBUMIN, AST, ALT, ALKPHOS, BILITOT in the last 168 hours. Hematology Recent Labs  Lab 05/31/18 1357  WBC 5.0  RBC 4.70  HGB 12.8*  HCT 39.3  MCV 83.6  MCH 27.2  MCHC 32.6  RDW 14.6  PLT 184   Cardiac EnzymesNo results for input(s): TROPONINI in the last 168 hours.  Recent Labs  Lab 05/31/18 1401  TROPIPOC 0.00    BNP Recent Labs  Lab 05/31/18 1358  BNP 206.7*    DDimer No results for input(s): DDIMER in the last 168 hours.  Radiology/Studies:  Dg Chest Port 1 View  Result Date: 05/31/2018 CLINICAL DATA:  84 year old male with history of syncope. Loss of consciousness for 1-2 minutes. EXAM: PORTABLE CHEST 1 VIEW COMPARISON:  Chest x-ray 01/27/2017. FINDINGS: Lung volumes are low. Mild diffuse interstitial prominence which appears to be chronic and is similar to prior examinations. No consolidative airspace disease. No pleural effusions. No pneumothorax. No pulmonary nodule or mass noted. Pulmonary vasculature and the cardiomediastinal silhouette are within normal limits. Aortic atherosclerosis. Electronic device is projecting over the left hemithorax likely to represent implantable loop recorder is. IMPRESSION: 1. Low lung volumes without radiographic evidence of acute cardiopulmonary disease. 2. Aortic atherosclerosis. Electronically Signed   By: Vinnie Langton M.D.   On: 05/31/2018 14:45    Assessment and Plan:   1. Recurrent syncope -Patient has long standing history of recurrent syncope without specific etiology.  No correlation with implantable loop recorder which reached ERI April 2019.   Last  check August 2019 showed 3 episode of atrial fibrillation.  He also has history of orthostatic hypotension with autonomic dysfunction and Parkinson. -Patient is currently wearing ZIO monitor to rule out sick sinus syndrome.  Unfortunately this is not a live device.  We need to return in for interrogation.  I have asked [employer] rep to interrogate his loop recorder. ? Still a pacemaker candidate onc data is available or needs palliative care.   2.  Paroxysmal atrial fibrillation -As summarized above.  Not on chronic anticoagulation due to prior history of GI bleed and subdural hematoma.  3.  Frequent PVCs/bigeminy -Potassium 4.3. On Magnesium supplement.  Not on beta-blocker due to orthostasis.  Otherwise per primary team      For questions or updates, please contact [place] Please consult [email or web address] for contact info under     Signed, Minus Breeding, MD  06/01/2018 2:56 PM   History and all data above reviewed.  Patient examined.  I agree with the findings as above.   The patient had syncope while standing at church.  He did not per his wife express any prodrome.  He has a aphasia and so is very difficult to understand him.  She said that she looked over and noticed that he was starting to slump and she had a bystander were able to ease into the pew.  She says it was completely unresponsive and had shallowed but not agonal breathing.  He was moved to the back of the church and in the vestibule in a chair once he regained consciousness after several minutes.  When EMS arrived he apparently had a normal heart rate without hypotension.  He is had ventricular ectopy.  He has had a work-up as above without clear documentation for a primary arrhythmia.  He is actually wearing a ZIO Patch currently.  He has poor balance.  He gets around slowly with a walker but this is otherwise been feeling pretty well.  He is not noticing any palpitations.  He does have some orthostatic symptoms and  wears knee-high compression stockings.  He patient exam reveals COR: Regular rate and rhythm,  Lungs: Decreased breath sounds at the bases but without crackles or wheezing,  Abd: Positive bowel sounds normal frequency and pitch, Ext 2+ pulses throughout and no edema..  All available labs, radiology testing, previous records reviewed. Agree with documented assessment and plan.  Syncope :  He has had no significant dysrhythmia overnight except for ventricular ectopy.  We are trying to expedite download of his monitor as it cannot be downloaded remotely we will have to send it in.  I discussed this with his wife at bedside.  They certainly would prefer to go home to await these results. Jeneen Rinks Presli Fanguy  3:15 PM  06/01/2018

## 2018-06-01 NOTE — Progress Notes (Signed)
Telemetry called RN to inform her pt had 3 beats run of V-tach; pt noted to be in vent-bigeminy on the monitor. NP on-call paged and notified; no new orders received. Pt asymptomatic; resting comfortably in bed with spouse at bedside. Pt alert and denies any discomfort. Will continue to closely monitor. Delia Heady RN

## 2018-06-02 DIAGNOSIS — E871 Hypo-osmolality and hyponatremia: Secondary | ICD-10-CM | POA: Diagnosis not present

## 2018-06-02 DIAGNOSIS — I495 Sick sinus syndrome: Secondary | ICD-10-CM | POA: Diagnosis not present

## 2018-06-02 DIAGNOSIS — G20 Parkinson's disease: Secondary | ICD-10-CM | POA: Diagnosis not present

## 2018-06-02 DIAGNOSIS — R55 Syncope and collapse: Secondary | ICD-10-CM | POA: Diagnosis not present

## 2018-06-02 NOTE — Discharge Summary (Signed)
Physician Discharge Summary  [place] SFK:[id number] DOB: [birth year] DOA: 05/31/2018  PCP: [name], MD  Admit date: 05/31/2018 Discharge date: 06/02/2018  Admitted From: Home  Disposition:  Home   Recommendations for Outpatient Follow-up and new medication changes:  1. Follow up with Dr. Delena Bali in 7 days 2. Outpatient physical therapy. 3. Avoid orthostatics 4. Follow up with cardiology in regards of percutaneous monitor.  5. Discontinue as needed clonidine.   Home Health: no   Equipment/Devices: stable    Discharge Condition: stable  CODE STATUS: full  Diet recommendation: heart healthy   Brief/Interim Summary: 84 year old male who presented with recurrent syncope episodes.This time occurred while standing, he was about 5 minutes on his feet before the event, no prodromalsymptoms. Patient was noted to be cyanotic, pulse was weak andhe wasunresponsive for about 30 minutes.No features of seizure activity. On his initial physical examination blood pressure 150/94 heart rate 43-79, respiratory rate 12, oxygen saturation 97%. Dry mucous membranes, lungs clear to auscultation, heart S1-S2 present rhythmic, abdomen soft, no lower extremity edema. Sodium 126, potassium 4.5, chloride 95, bicarb 24, glucose 104, BUN 12, creatinine 0.95, white count 5.0, hemoglobin 12.8, hematocrit 39.3, platelets 184.Chest radiograph, hypoinflated, no infiltrates. EKG with normal sinus rhythm, left axis deviation, poor R wave progression, normal intervals, positive PVCs  Patient was admitted to the hospital with a working diagnosis of syncope, rule outcardiac in origin.  1.  Syncope/ possible sick sinus syndrome.  Patient was admitted to the medical ward, he was placed on a remote telemetry monitor, received intravenous fluids.  No further syncope episodes.  Telemetry with frequent PVCs, no significant arrhythmia.  Patient was seen by cardiology, currently trying to expedite  download information of his continuous heart monitor.  Patient was advised to avoid orthostatics.  Physical therapy recommended outpatient therapy that was arranged.  Patient has a history of paroxysmal atrial fibrillation, not on anticoagulation due to history of gastrointestinal bleed and subdural hematoma.  2.  Hypertension.  Blood pressure remained well controlled, I will recommend no tight blood pressure control due to his risk of recurrent syncope.  Currently off antihypertensive agents.  3.  Hyponatremia due to dehydration.  Patient received gentle hydration with isotonic saline with improvement of electrolytes, discharge sodium 139, kidney function remains well-preserved.  Potassium 4.3  4.  Dyslipidemia.  Continue pravastatin.  5.  Iron deficiency.  Continue iron supplements.  6.  Parkinson's disease.  Outpatient physical therapy.  Discharge Diagnoses:  Active Problems:   Syncope    Discharge Instructions  Discharge Instructions    Ambulatory referral to Physical Therapy   Complete by:  As directed      Allergies as of 06/02/2018      Reactions   Anacin [aspirin] Other (See Comments)   GI bleed   Augmentin [amoxicillin-pot Clavulanate] Hives   Hemocyte Plus [hematinic Plus Vit-minerals] Other (See Comments)   Unknown reaction   Penicillins Hives   Has patient had a PCN reaction causing immediate rash, facial/tongue/throat swelling, SOB or lightheadedness with hypotension: No Has patient had a PCN reaction causing severe rash involving mucus membranes or skin necrosis: Yes Has patient had a PCN reaction that required hospitalization: No Has patient had a PCN reaction occurring within the last 10 years: No If all of the above answers are "NO", then may proceed with Cephalosporin use.      Medication List    STOP taking these medications   cloNIDine 0.1 MG tablet Commonly known as:  CATAPRES  TAKE these medications   acetaminophen 500 MG tablet Commonly known  as:  TYLENOL Take 500 mg by mouth every 6 (six) hours as needed for headache (pain).   BOTOX IJ Inject as directed every 3 (three) months. Next injection due in December 2019   cyanocobalamin 1000 MCG/ML injection Commonly known as:  (VITAMIN B-12) Inject 1,000 mcg into the muscle every 30 (thirty) days.   EPINEPHrine 0.3 mg/0.3 mL Soaj injection Commonly known as:  EPI-PEN Inject 0.3 mg into the muscle daily as needed (allergic reaction).   ferrous sulfate 325 (65 FE) MG tablet Take 325 mg by mouth 2 (two) times daily with a meal.   ipratropium 0.03 % nasal spray Commonly known as:  ATROVENT Place 1 spray into the nose 2 (two) times daily.   loratadine 10 MG tablet Commonly known as:  CLARITIN Take 10 mg by mouth at bedtime.   magnesium oxide 400 MG tablet Commonly known as:  MAG-OX Take 800 mg by mouth 2 (two) times daily.   omeprazole 20 MG capsule Commonly known as:  PRILOSEC Take 20 mg by mouth daily.   potassium chloride 10 MEQ tablet Commonly known as:  K-DUR,KLOR-CON Take 10 mEq by mouth daily.   pravastatin 40 MG tablet Commonly known as:  PRAVACHOL Take 40 mg by mouth at bedtime.   PRESCRIPTION MEDICATION Inhale into the lungs at bedtime. CPAP   PROBIOTIC DAILY PO Take 1 tablet by mouth at bedtime.   psyllium 58.6 % packet Commonly known as:  METAMUCIL Take 1 packet by mouth 2 (two) times daily.   ranitidine 300 MG tablet Commonly known as:  ZANTAC Take 300 mg by mouth at bedtime.   temazepam 15 MG capsule Commonly known as:  RESTORIL Take 15 mg by mouth at bedtime.   triamcinolone ointment 0.1 % Commonly known as:  KENALOG Apply 1 application topically 3 (three) times daily as needed (rash).   Vitamin D 50 MCG (2000 UT) tablet Take 2,000-3,000 Units by mouth See admin instructions. Take 1 1/2 tablets (3000 units) by mouth every morning and 1 tablet (2000 unit) at night      Follow-up Information    [facility] Follow up on 06/09/2018.   Why:   Your appointment time is 11 am.  Contact information: [address], [place], [place] [address]  [phone]         Allergies  Allergen Reactions  . Anacin [Aspirin] Other (See Comments)    GI bleed  . Augmentin [Amoxicillin-Pot Clavulanate] Hives  . Hemocyte Plus [Hematinic Plus Vit-Minerals] Other (See Comments)    Unknown reaction  . Penicillins Hives    Has patient had a PCN reaction causing immediate rash, facial/tongue/throat swelling, SOB or lightheadedness with hypotension: No Has patient had a PCN reaction causing severe rash involving mucus membranes or skin necrosis: Yes Has patient had a PCN reaction that required hospitalization: No Has patient had a PCN reaction occurring within the last 10 years: No If all of the above answers are "NO", then may proceed with Cephalosporin use.    Consultations:  Cardiology    Procedures/Studies: Dg Chest Port 1 View  Result Date: 05/31/2018 CLINICAL DATA:  84 year old male with history of syncope. Loss of consciousness for 1-2 minutes. EXAM: PORTABLE CHEST 1 VIEW COMPARISON:  Chest x-ray 01/27/2017. FINDINGS: Lung volumes are low. Mild diffuse interstitial prominence which appears to be chronic and is similar to prior examinations. No consolidative airspace disease. No pleural effusions. No pneumothorax. No pulmonary nodule or  mass noted. Pulmonary vasculature and the cardiomediastinal silhouette are within normal limits. Aortic atherosclerosis. Electronic device is projecting over the left hemithorax likely to represent implantable loop recorder is. IMPRESSION: 1. Low lung volumes without radiographic evidence of acute cardiopulmonary disease. 2. Aortic atherosclerosis. Electronically Signed   By: Vinnie Langton M.D.   On: 05/31/2018 14:45       Subjective: Patient is feeling well, no nausea or vomiting, no chest pain, no further syncope episodes.   Discharge Exam: Vitals:   06/02/18 0334 06/02/18 0737  BP: (!) 145/75  135/79  Pulse: 72 85  Resp: 18 19  Temp: 97.9 F (36.6 C) (!) 97.5 F (36.4 C)  SpO2: 99% 98%   Vitals:   06/01/18 2023 06/01/18 2338 06/02/18 0334 06/02/18 0737  BP: (!) 143/86 (!) 157/95 (!) 145/75 135/79  Pulse: (!) 55 70 72 85  Resp: 18 18 18 19   Temp: 97.9 F (36.6 C) 97.7 F (36.5 C) 97.9 F (36.6 C) (!) 97.5 F (36.4 C)  TempSrc: Oral Axillary Axillary Oral  SpO2: 98% 98% 99% 98%  Weight:      Height:        General: Not in pain or dyspnea  Neurology: Awake and alert, non focal/   E ENT: no pallor, no icterus, oral mucosa moist Cardiovascular: No JVD. S1-S2 present, rhythmic, no gallops, rubs, or murmurs. No lower extremity edema. Pulmonary: vesicular breath sounds bilaterally, adequate air movement, no wheezing, rhonchi or rales. Gastrointestinal. Abdomen with no organomegaly, non tender, no rebound or guarding Skin. No rashes Musculoskeletal: no joint deformities   The results of significant diagnostics from this hospitalization (including imaging, microbiology, ancillary and laboratory) are listed below for reference.     Microbiology: No results found for this or any previous visit (from the past 240 hour(s)).   Labs: BNP (last 3 results) Recent Labs    05/31/18 1358  BNP 161.0*   Basic Metabolic Panel: Recent Labs  Lab 05/31/18 1357 06/01/18 0551  NA 126* 131*  K 4.5 4.3  CL 95* 99  CO2 24 26  GLUCOSE 104* 91  BUN 12 10  CREATININE 0.95 0.79  CALCIUM 8.3* 8.2*   Liver Function Tests: No results for input(s): AST, ALT, ALKPHOS, BILITOT, PROT, ALBUMIN in the last 168 hours. No results for input(s): LIPASE, AMYLASE in the last 168 hours. No results for input(s): AMMONIA in the last 168 hours. CBC: Recent Labs  Lab 05/31/18 1357  WBC 5.0  NEUTROABS 3.3  HGB 12.8*  HCT 39.3  MCV 83.6  PLT 184   Cardiac Enzymes: No results for input(s): CKTOTAL, CKMB, CKMBINDEX, TROPONINI in the last 168 hours. BNP: Invalid input(s):  POCBNP CBG: No results for input(s): GLUCAP in the last 168 hours. D-Dimer No results for input(s): DDIMER in the last 72 hours. Hgb A1c No results for input(s): HGBA1C in the last 72 hours. Lipid Profile No results for input(s): CHOL, HDL, LDLCALC, TRIG, CHOLHDL, LDLDIRECT in the last 72 hours. Thyroid function studies No results for input(s): TSH, T4TOTAL, T3FREE, THYROIDAB in the last 72 hours.  Invalid input(s): FREET3 Anemia work up No results for input(s): VITAMINB12, FOLATE, FERRITIN, TIBC, IRON, RETICCTPCT in the last 72 hours. Urinalysis    Component Value Date/Time   COLORURINE YELLOW 01/27/2017 0021   APPEARANCEUR CLEAR 01/27/2017 0021   LABSPEC 1.006 01/27/2017 0021   PHURINE 6.0 01/27/2017 0021   GLUCOSEU NEGATIVE 01/27/2017 0021   HGBUR NEGATIVE 01/27/2017 0021   BILIRUBINUR NEGATIVE 01/27/2017 0021  KETONESUR NEGATIVE 01/27/2017 0021   PROTEINUR NEGATIVE 01/27/2017 0021   UROBILINOGEN 0.2 10/10/2014 1146   NITRITE NEGATIVE 01/27/2017 0021   LEUKOCYTESUR NEGATIVE 01/27/2017 0021   Sepsis Labs Invalid input(s): PROCALCITONIN,  WBC,  LACTICIDVEN Microbiology No results found for this or any previous visit (from the past 240 hour(s)).   Time coordinating discharge: 45 minutes  SIGNED:   Tawni Millers, MD  [facility] 06/02/2018, 8:43 AM Pager [phone]  If 7PM-7AM, please contact night-coverage [email or web address] Password [email or web address]

## 2018-06-02 NOTE — Plan of Care (Signed)
Adequate for discharge.

## 2018-06-02 NOTE — Care Management Obs Status (Signed)
[facility] NOTIFICATION   Patient Details  Name: Joe Glover MRN: [id number] Date of Birth: [birth year]   Medicare Observation Status Notification Given:  Yes    Pollie Friar, RN 06/02/2018, 12:46 PM

## 2018-06-02 NOTE — Progress Notes (Addendum)
Progress Note  Patient Name: Joe Glover Date of Encounter: 06/02/2018  Primary Cardiologist: Shirlee More, MD   Subjective   No complaints this morning. No dizziness, lightheadedness, chest pain, SOB, or recurrent syncope.   Contacted the zio patch company. Request made for an expedited report given syncopal event. Wife instructed to ensure zio patch placed in the mail today. Assisted her with removing the device and packaging it for shipping.   Inpatient Medications    Scheduled Meds: . acidophilus  1 capsule Oral QHS  . cholecalciferol  3,000 Units Oral Daily   And  . cholecalciferol  2,000 Units Oral QHS  . [START ON 06/08/2018] cyanocobalamin  1,000 mcg Intramuscular Q30 days  . enoxaparin (LOVENOX) injection  40 mg Subcutaneous Q24H  . ferrous sulfate  325 mg Oral BID WC  . magnesium oxide  800 mg Oral BID  . pantoprazole  40 mg Oral Daily  . pravastatin  40 mg Oral QHS  . psyllium  1 packet Oral BID   Continuous Infusions:  PRN Meds: acetaminophen **OR** acetaminophen, ondansetron **OR** ondansetron (ZOFRAN) IV, temazepam   Vital Signs    Vitals:   06/01/18 2338 06/02/18 0334 06/02/18 0737 06/02/18 1143  BP: (!) 157/95 (!) 145/75 135/79 (!) 138/101  Pulse: 70 72 85 73  Resp: 18 18 19    Temp: 97.7 F (36.5 C) 97.9 F (36.6 C) (!) 97.5 F (36.4 C)   TempSrc: Axillary Axillary Oral   SpO2: 98% 99% 98% 98%  Weight:      Height:        Intake/Output Summary (Last 24 hours) at 06/02/2018 1206 Last data filed at 06/01/2018 1844 Gross per 24 hour  Intake 240 ml  Output -  Net 240 ml   Filed Weights   05/31/18 1244  Weight: 71 kg    Telemetry    Sinus rhythm with frequent PVCs - Personally Reviewed  Physical Exam   GEN: Thin elderly gentleman laying in bed in no acute distress.   Neck: No JVD, no carotid bruits Cardiac: RRR, no murmurs, rubs, or gallops.  Respiratory: Clear to auscultation bilaterally, no wheezes/ rales/ rhonchi GI: NABS,  Soft, nontender, non-distended  MS: No edema; No deformity. Neuro:  Nonfocal, moving all extremities spontaneously Psych: Normal affect   Labs    Chemistry Recent Labs  Lab 05/31/18 1357 06/01/18 0551  NA 126* 131*  K 4.5 4.3  CL 95* 99  CO2 24 26  GLUCOSE 104* 91  BUN 12 10  CREATININE 0.95 0.79  CALCIUM 8.3* 8.2*  GFRNONAA >60 >60  GFRAA >60 >60  ANIONGAP 7 6     Hematology Recent Labs  Lab 05/31/18 1357  WBC 5.0  RBC 4.70  HGB 12.8*  HCT 39.3  MCV 83.6  MCH 27.2  MCHC 32.6  RDW 14.6  PLT 184    Cardiac EnzymesNo results for input(s): TROPONINI in the last 168 hours.  Recent Labs  Lab 05/31/18 1401  TROPIPOC 0.00     BNP Recent Labs  Lab 05/31/18 1358  BNP 206.7*     DDimer No results for input(s): DDIMER in the last 168 hours.   Radiology    Dg Chest Port 1 View  Result Date: 05/31/2018 CLINICAL DATA:  84 year old male with history of syncope. Loss of consciousness for 1-2 minutes. EXAM: PORTABLE CHEST 1 VIEW COMPARISON:  Chest x-ray 01/27/2017. FINDINGS: Lung volumes are low. Mild diffuse interstitial prominence which appears to be chronic and is similar to prior  examinations. No consolidative airspace disease. No pleural effusions. No pneumothorax. No pulmonary nodule or mass noted. Pulmonary vasculature and the cardiomediastinal silhouette are within normal limits. Aortic atherosclerosis. Electronic device is projecting over the left hemithorax likely to represent implantable loop recorder is. IMPRESSION: 1. Low lung volumes without radiographic evidence of acute cardiopulmonary disease. 2. Aortic atherosclerosis. Electronically Signed   By: Vinnie Langton M.D.   On: 05/31/2018 14:45    Cardiac Studies   Echocardiogram 01/2017: Study Conclusions  - Left ventricle: The cavity size was normal. Wall thickness was   increased in a pattern of mild LVH. There was moderate focal   basal hypertrophy of the septum. Systolic function was normal.    The estimated ejection fraction was in the range of 55% to 60%.   Wall motion was normal; there were no regional wall motion   abnormalities. Doppler parameters are consistent with abnormal   left ventricular relaxation (grade 1 diastolic dysfunction). The   E/e&' ratio is between 8-15, suggesting indeterminate LV filling   pressure. - Aortic valve: Trileaflet. Sclerosis without stenosis. There was   trivial regurgitation. - Mitral valve: Calcified annulus. Mildly thickened leaflets .   There was trivial regurgitation. - Left atrium: The atrium was normal in size. - Tricuspid valve: There was trivial regurgitation. - Pulmonary arteries: PA peak pressure: 20 mm Hg (S). - Inferior vena cava: The vessel was normal in size. The   respirophasic diameter changes were in the normal range (>= 50%),   consistent with normal central venous pressure.  Impressions:  - LVEF 55-60%, mild LVH with moderate focal septal hypertrophy,   normal wall motion, grade 1 DD, indetermiante LV filling   pressure, aortic valve sclerosis with trivial AI, trivial MR,   normal LA size, trivial TR, RVSP 20 mmHg, normal IVC.  Patient Profile     84 y.o. male with a hx of autonomic dysfunction, recurrent syncope, status post implantable loop recorder with ERI April 2019, paroxysmal atrial fibrillation who is not on anticoagulation due to prior GI bleed, subdural hematoma, hypertension, Parkinson disease, colon cancer (S/P colectomy), prostate cancer (S/P prostatectomy and orchiectomy) and hyperlipidemia, who is being followed by cardiology for syncope.  Assessment & Plan    1. Recurrent syncope: p/w syncopal episode 05/31/18 while singing a hymn in church. He was wearing a zio patch at the time to evaluate for SSS/paroxsymal atrial fibrillation. Zio patch removed today and packaged for shipping. Wife to take to post office today. [employer] contacted and will expedite the report. Orthostatics negative today.    - Outpatient follow-up has been arranged with Dr. Percival Spanish for 06/12/18 to review the cardiac monitor report.   2. Paroxysmal atrial fibrillation: no evidence on telemetry this admission. Not on [facility] given history of GI bleed, subdural hematoma, and falls.  - Follow-up zio patch monitor report  3. Frequent PVCs/bigeminy: no complaints of palpitations. Not on BBlocker due to history of orthostasis - Follow-up zio patch monitor report  [facility] will sign off.   Medication Recommendations:  Continue current medications Other recommendations (labs, testing, etc):  Will follow-up zio patch report Follow up as an outpatient:  Scheduled to see Dr. Percival Spanish 06/12/18 at 9:40am; patient can follow-up with Dr. [name] going forward. Discharge AVS updated   For questions or updates, please contact [facility] Please consult [email or web address] for contact info under Cardiology/STEMI.      Signed, Abigail Butts, PA-C  06/02/2018, 12:06 PM   [phone]  History and all  data above reviewed.  Patient examined.  I agree with the findings as above.  He has had no further episodes.  He denies any presyncope or syncope.  The patient exam reveals COR:RRR  ,  Lungs: Clear  ,  Abd: Positive bowel sounds, no rebound no guarding, Ext No edema  .  All available labs, radiology testing, previous records reviewed. Agree with documented assessment and plan.  Sycnope:  Etiology is not clear.  I will see him next week as above to follow up on event monitor.    Jeneen Rinks [place]  12:43 PM  06/02/2018

## 2018-06-10 NOTE — Progress Notes (Signed)
Cardiology Office Note   Date:  06/12/2018   ID:  Joe Glover, DOB 07-22-1924, MRN [id number]  PCP:  Joe Dress, MD  Cardiologist:   Joe More, MD   Chief Complaint  Patient presents with  . Loss of Consciousness      History of Present Illness: Joe Glover is a 84 y.o. male who presents for follow up of syncope.  He was in the hospital recently for this and there was no clear etiology.  He wore a ZIO patch and there were no arrhythmias.  There were no bradycardia arrhythmias even though he had an event while wearing the monitor.  His wife is very frustrated by this because these events have recurred with him having syncope when he stands or sits for a while.  It never happened when he was recumbent.  He does have paroxysmal atrial fibrillation.  He has PVCs.  He has not had any sustained arrhythmias to explain syncope.  His blood pressure was not evaluated at the moment he had syncope but a few minutes later and it was not particularly low.  Still this could be an orthostatic event.  His wife was concerned because he was out for about 30 minutes although there were no agonal respirations or no clear lack of pulse.  He recovered completely by the time we saw him in the hospital.  He otherwise feels okay.  He started back on physical therapy though he was maneuvering slowly   Past Medical History:  Diagnosis Date  . Awareness alteration, transient 11/02/2014  . Cellulitis of right hand 01/27/2017  . Colon cancer ([place])   . Essential hypertension 09/23/2012  . Fall 09/16/2016  . Fall from standing 02/25/2017  . GI bleed   . Hyperlipidemia   . Hypertension   . Insomnia 08/13/2016  . Normocytic anemia 01/27/2017  . On amiodarone therapy 08/06/2015  . Orthostatic hypotension 08/06/2015  . OSA (obstructive sleep apnea)   . PAF (paroxysmal atrial fibrillation) ([place])   . Parkinson disease ([place])   . Prostate cancer ([place])   . SDH (subdural hematoma) ([place]) 09/16/2016  . Status  post placement of implantable loop recorder 07/11/2015  . Stroke ([place])   . 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 Subdural hematoma ([place]) 09/05/2016  . Syncope 11/02/2014  . TIA (transient ischemic attack)   . Vitamin D deficiency     Past Surgical History:  Procedure Laterality Date  . COLECTOMY    . HERNIA REPAIR    . ORCHIECTOMY    . PROSTATECTOMY       Current Outpatient Medications  Medication Sig Dispense Refill  . acetaminophen (TYLENOL) 500 MG tablet Take 500 mg by mouth every 6 (six) hours as needed for headache (pain).    . Cholecalciferol (VITAMIN D) 50 MCG (2000 UT) tablet Take 2,000-3,000 Units by mouth See admin instructions. Take 1 1/2 tablets (3000 units) by mouth every morning and 1 tablet (2000 unit) at night    . cyanocobalamin (,VITAMIN B-12,) 1000 MCG/ML injection Inject 1,000 mcg into the muscle every 30 (thirty) days.     [name] Kitchen EPINEPHrine 0.3 mg/0.3 mL IJ SOAJ injection Inject 0.3 mg into the muscle daily as needed (allergic reaction).    . ferrous sulfate 325 (65 FE) MG tablet Take 325 mg by mouth 2 (two) times daily with a meal.     . ipratropium (ATROVENT) 0.03 %  nasal spray Place 1 spray into the nose 2 (two) times daily.    [name] Kitchen loratadine (CLARITIN) 10 MG tablet Take 10 mg by mouth at bedtime.     . magnesium oxide (MAG-OX) 400 MG tablet Take 800 mg by mouth 2 (two) times daily.     [name] Kitchen omeprazole (PRILOSEC) 20 MG capsule Take 20 mg by mouth daily.   1  . OnabotulinumtoxinA (BOTOX IJ) Inject as directed every 3 (three) months. Next injection due in December 2019    . potassium chloride (K-DUR,KLOR-CON) 10 MEQ tablet Take 10 mEq by mouth daily.   0  . pravastatin (PRAVACHOL) 40 MG tablet Take 40 mg by mouth at bedtime.   1  . PRESCRIPTION MEDICATION Inhale into the lungs at bedtime. CPAP    . Probiotic Product (PROBIOTIC DAILY PO) Take 1 tablet by mouth at bedtime.     .  psyllium (METAMUCIL) 58.6 % packet Take 1 packet by mouth 2 (two) times daily.     . ranitidine (ZANTAC) 300 MG tablet Take 300 mg by mouth at bedtime.   1  . temazepam (RESTORIL) 15 MG capsule Take 15 mg by mouth at bedtime.   0  . triamcinolone ointment (KENALOG) 0.1 % Apply 1 application topically 3 (three) times daily as needed (rash).     No current facility-administered medications for this visit.     Allergies:   Anacin [aspirin]; Augmentin [amoxicillin-pot clavulanate]; Hemocyte plus [hematinic plus vit-minerals]; and Penicillins    ROS:  Please see the history of present illness.   Otherwise, review of systems are positive for none.   All other systems are reviewed and negative.    PHYSICAL EXAM: VS:  BP (!) 145/80   Pulse 80   Ht 6' (1.829 m)   Wt 157 lb 3.2 oz (71.3 kg)   BMI 21.32 kg/m  , BMI Body mass index is 21.32 kg/m.  He was not orthostatic in the office today GENERAL: Frail-appearing HEENT:  Pupils equal round and reactive, fundi not visualized, oral mucosa unremarkable NECK:  No jugular venous distention, waveform within normal limits, carotid upstroke brisk and symmetric, no bruits, no thyromegaly LUNGS:  Clear to auscultation bilaterally CHEST:  Unremarkable HEART:  PMI not displaced or sustained,S1 and S2 within normal limits, no S3, no S4, no clicks, no rubs, no murmurs ABD:  Flat, positive bowel sounds normal in frequency in pitch, no bruits, no rebound, no guarding, no midline pulsatile mass, no hepatomegaly, no splenomegaly EXT:  2 plus pulses throughout, no edema, no cyanosis no clubbing NEURO: Dysarthria   EKG:  EKG is not ordered today. The ekg ordered 05/31/18 demonstrates sinus rhythm, rate 75, left axis deviation, left anterior fascicular block, borderline interventricular conduction delay, no acute ST-T wave changes.   Recent Labs: 05/31/2018: B Natriuretic Peptide 206.7; Hemoglobin 12.8; Platelets 184 06/01/2018: BUN 10; Creatinine, Ser 0.79;  Potassium 4.3; Sodium 131    Lipid Panel No results found for: CHOL, TRIG, HDL, CHOLHDL, VLDL, LDLCALC, LDLDIRECT    Wt Readings from Last 3 Encounters:  06/12/18 157 lb 3.2 oz (71.3 kg)  05/31/18 156 lb 8.4 oz (71 kg)  05/14/18 157 lb (71.2 kg)      Other studies Reviewed: Additional studies/ records that were reviewed today include: ZIO Patch. Review of the above records demonstrates:  Please see elsewhere in the note.     ASSESSMENT AND PLAN:  SYNCOPE:   Unfortunately do not have a clear etiology for the syncope.  It was  not related to a bradycardia arrhythmia as he was wearing the patch when it happened.  It could still been a blood pressure issue.  I have instructed him to get some thigh-high compression stockings.  They will go down to their home outlet and get this done.  We talked about avoidance of prolonged sitting or standing.  I will check carotids but I do not see that these have been done.  He is instructed to call EMS if this happens again.  Unfortunately I do not have any further testing that I think would be helpful in evaluating this at this time.  PAF:   He is not on anticoagulation secondary to GI bleeding.      Current medicines are reviewed at length with the patient today.  The patient does not have concerns regarding medicines.  The following changes have been made:  no change  Labs/ tests ordered today include:  No orders of the defined types were placed in this encounter.    Disposition:   FU with me in 3 months.     Signed, Minus Breeding, MD  06/12/2018 11:18 AM    [place]

## 2018-06-12 ENCOUNTER — Ambulatory Visit (INDEPENDENT_AMBULATORY_CARE_PROVIDER_SITE_OTHER): Payer: Medicare Other | Admitting: Cardiology

## 2018-06-12 ENCOUNTER — Encounter: Payer: Self-pay | Admitting: Cardiology

## 2018-06-12 ENCOUNTER — Ambulatory Visit: Payer: Medicare Other | Admitting: Cardiology

## 2018-06-12 VITALS — BP 145/80 | HR 80 | Ht 72.0 in | Wt 157.2 lb

## 2018-06-12 DIAGNOSIS — I480 Paroxysmal atrial fibrillation: Secondary | ICD-10-CM | POA: Diagnosis not present

## 2018-06-12 DIAGNOSIS — R55 Syncope and collapse: Secondary | ICD-10-CM | POA: Diagnosis not present

## 2018-06-12 NOTE — Patient Instructions (Signed)
Medication Instructions:  Continue current medications  If you need a refill on your cardiac medications before your next appointment, please call your pharmacy.  Labwork: None Ordered   If you have labs (blood work) drawn today and your tests are completely normal, you will receive your results only by: [name] Kitchen MyChart Message (if you have MyChart) OR . A paper copy in the mail If you have any lab test that is abnormal or we need to change your treatment, we will call you to review the results.  Testing/Procedures: Your physician has requested that you have a carotid duplex. This test is an ultrasound of the carotid arteries in your neck. It looks at blood flow through these arteries that supply the brain with blood. Allow one hour for this exam. There are no restrictions or special instructions.  Follow-Up: You will need a follow up appointment in 4 Months.  Please call our office 2 months in [phone]) to schedule the (4 Months) appointment.  You may see  DR Percival Spanish,  or one of the following Advanced Practice Providers on your designated Care Team:    . Rosaria Ferries, PA-C  -[place], DNP, ANP  . Kerin Ransom, [place]  . Almyra Deforest, [place], PA-C  At [facility], you and your health needs are our priority.  As part of our continuing mission to provide you with exceptional heart care, we have created designated Provider Care Teams.  These Care Teams include your primary Cardiologist (physician) and Advanced Practice Providers (APPs -  Physician Assistants and Nurse Practitioners) who all work together to provide you with the care you need, when you need it.   Thank you for choosing [facility] at [facility]!!

## 2018-06-17 NOTE — Progress Notes (Signed)
Cardiology Office Note:    Date:  06/18/2018   ID:  Joe Glover, DOB 01/11/1925, MRN [id number]  PCP:  Joe Dress, MD  Cardiologist:  Joe More, MD    Referring MD: Joe Dress, MD    ASSESSMENT:    1. Syncope, unspecified syncope type   2. PAF (paroxysmal atrial fibrillation) ([place])   3. Essential hypertension    PLAN:    In order of problems listed above:  1. 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  Is very difficult to treat but we need to remove exacerbating factors like prolonged standing will sit in church from this point point forward.  To augment standing blood pressure use support hose and abdominal binder to expand his blood volume and salt his food preferably the take over-the-counter salt tablet daily.  Family understands that we can no longer be concerned with or treat sitting hypertension.  I offered to initiate him on Northera they declined. 2. Stable no clinical recurrence 3. Stable with his autonomic dysfunction I would not place him on antihypertensive medications   Next appointment: 3 months   Medication Adjustments/Labs and Tests Ordered: Current medicines are reviewed at length with the patient today.  Concerns regarding medicines are outlined above.  No orders of the defined types were placed in this encounter.  Meds ordered this encounter  Medications  . sodium chloride 1 g tablet    Sig: Take 1 tablet (1 g total) by mouth daily.    Chief Complaint  Patient presents with  . Follow-up    after syncope  . Hypotension    History of Present Illness:    Joe Glover is a 84 y.o. male with a hx of HTN, HLD, colon cancer (S/P colectomy), prostate cancer (S/P prostatectomy and orchiectomy), GERD, severe right knee OA, vascular Parkinsonism with baseline mild dysarthria, OSA on CPAP, paroxysmal A. fib (not on  anticoagulation; prior GI bleed in 2014 requiring 16 units PRBCs) and recurrent syncopal episodes  last seen 09/13/07.  His last loop recorder download 02/09/2018 shows brief episodes of atrial fibrillation.  He has been at [facility] since April 2019.  last seen 05/14/18. He had another profound syncopal episode and admitted to [facility].. Compliance with diet, lifestyle and medications: Yes  Unfortunately just had a syncopal episode quite profound in church after prolonged standing he has a history of Parkinson's disease previously demonstrable hypotension and recurrent loss of consciousness unrelated to arrhythmia.  He is wearing an extended ambulatory monitor at the time and there is no sustained arrhythmia or bradycardia to account for loss of consciousness.  Today 1 and 3 minutes does not drop his blood pressure in the past his use support hose and abdominal binder will resume after asked him to add salt to his diet or salt tablets and offered to initiate him on Northera the family declines. Past Medical History:  Diagnosis Date  . Awareness alteration, transient 11/02/2014  . Cellulitis of right hand 01/27/2017  . Colon cancer ([place])   . Essential hypertension 09/23/2012  . Fall 09/16/2016  . Fall from standing 02/25/2017  . GI bleed   . Hyperlipidemia   . Hypertension   . Insomnia 08/13/2016  . Normocytic anemia 01/27/2017  . On amiodarone therapy 08/06/2015  . Orthostatic hypotension 08/06/2015  . OSA (obstructive sleep apnea)   . PAF (paroxysmal atrial fibrillation) ([place])   . Parkinson  disease ([place])   . Prostate cancer ([place])   . SDH (subdural hematoma) ([place]) 09/16/2016  . Status post placement of implantable loop recorder 07/11/2015  . Stroke ([place])   . 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 Subdural hematoma ([place]) 09/05/2016  . Syncope 11/02/2014  . TIA (transient ischemic attack)   . Vitamin D deficiency     Past  Surgical History:  Procedure Laterality Date  . COLECTOMY    . HERNIA REPAIR    . ORCHIECTOMY    . PROSTATECTOMY      Current Medications: Current Meds  Medication Sig  . acetaminophen (TYLENOL) 500 MG tablet Take 500 mg by mouth every 6 (six) hours as needed for headache (pain).  . Cholecalciferol (VITAMIN D) 50 MCG (2000 UT) tablet Take 2,000-3,000 Units by mouth See admin instructions. Take 1 1/2 tablets (3000 units) by mouth every morning and 1 tablet (2000 unit) at night  . cyanocobalamin (,VITAMIN B-12,) 1000 MCG/ML injection Inject 1,000 mcg into the muscle every 30 (thirty) days.   [name] Kitchen EPINEPHrine 0.3 mg/0.3 mL IJ SOAJ injection Inject 0.3 mg into the muscle daily as needed (allergic reaction).  . ferrous sulfate 325 (65 FE) MG tablet Take 325 mg by mouth 2 (two) times daily with a meal.   . ipratropium (ATROVENT) 0.03 % nasal spray Place 1 spray into the nose 2 (two) times daily.  [name] Kitchen loratadine (CLARITIN) 10 MG tablet Take 10 mg by mouth at bedtime.   . magnesium oxide (MAG-OX) 400 MG tablet Take 800 mg by mouth 2 (two) times daily.   [name] Kitchen omeprazole (PRILOSEC) 20 MG capsule Take 20 mg by mouth daily.   . OnabotulinumtoxinA (BOTOX IJ) Inject as directed every 3 (three) months. Next injection due in December 2019  . potassium chloride (K-DUR,KLOR-CON) 10 MEQ tablet Take 10 mEq by mouth daily.   . pravastatin (PRAVACHOL) 40 MG tablet Take 40 mg by mouth at bedtime.   [name] Kitchen PRESCRIPTION MEDICATION Inhale into the lungs at bedtime. CPAP  . Probiotic Product (PROBIOTIC DAILY PO) Take 1 tablet by mouth at bedtime.   . psyllium (METAMUCIL) 58.6 % packet Take 1 packet by mouth 2 (two) times daily.   . ranitidine (ZANTAC) 300 MG tablet Take 300 mg by mouth at bedtime.   . temazepam (RESTORIL) 15 MG capsule Take 15 mg by mouth at bedtime.      Allergies:   Anacin [aspirin]; Augmentin [amoxicillin-pot clavulanate]; Hemocyte plus [hematinic plus vit-minerals]; and Penicillins   Social History    Socioeconomic History  . Marital status: Married    Spouse name: Not on file  . Number of children: 2  . Years of education: Not on file  . Highest education level: Not on file  Occupational History  . Occupation: retired  Scientific laboratory technician  . Financial resource strain: Not on file  . Food insecurity:    Worry: Not on file    Inability: Not on file  . Transportation needs:    Medical: Not on file    Non-medical: Not on file  Tobacco Use  . Smoking status: Former Smoker    Packs/day: 2.00    Years: 20.00    Pack years: 40.00    Types: Cigarettes    Last attempt to quit: 07/08/1958    Years since quitting: 59.9  . Smokeless tobacco: Never Used  Substance and Sexual Activity  . Alcohol use: Yes  Alcohol/week: 0.0 standard drinks    Comment: occasional - 1-2 drinks a month  . Drug use: No  . Sexual activity: Not on file  Lifestyle  . Physical activity:    Days per week: Not on file    Minutes per session: Not on file  . Stress: Not on file  Relationships  . Social connections:    Talks on phone: Not on file    Gets together: Not on file    Attends religious service: Not on file    Active member of club or organization: Not on file    Attends meetings of clubs or organizations: Not on file    Relationship status: Not on file  Other Topics Concern  . Not on file  Social History Narrative  . Not on file     Family History: The patient's family history includes Heart attack in his brother and father; Parkinson's disease in his mother. ROS:   Please see the history of present illness.    All other systems reviewed and are negative.  EKGs/Labs/Other Studies Reviewed:    The following studies were reviewed today:  EKG: Hospital EKG is reviewed sinus rhythm he had no pauses or bradycardia documented  Recent ambulatory monitor shows APCs runs of APCs frequent PVCs but no sustained arrhythmia or bradycardia no recurrent atrial fibrillation  Recent Labs: 05/31/2018: B  Natriuretic Peptide 206.7; Hemoglobin 12.8; Platelets 184 06/01/2018: BUN 10; Creatinine, Ser 0.79; Potassium 4.3; Sodium 131  Recent Lipid Panel No results found for: CHOL, TRIG, HDL, CHOLHDL, VLDL, LDLCALC, LDLDIRECT  Physical Exam:    VS:  BP (!) 148/74 (BP Location: Right Arm, Patient Position: Standing, Cuff Size: Normal)   Pulse 74   Ht 6' (1.829 m)   Wt 157 lb (71.2 kg)   SpO2 95%   BMI 21.29 kg/m     Wt Readings from Last 3 Encounters:  06/18/18 157 lb (71.2 kg)  06/12/18 157 lb 3.2 oz (71.3 kg)  05/31/18 156 lb 8.4 oz (71 kg)     GEN: He looks very frail chronically ill debilitated well nourished, well developed in no acute distress HEENT: Normal NECK: No JVD; No carotid bruits LYMPHATICS: No lymphadenopathy CARDIAC: RRR, no murmurs, rubs, gallops RESPIRATORY:  Clear to auscultation without rales, wheezing or rhonchi  ABDOMEN: Soft, non-tender, non-distended MUSCULOSKELETAL:  No edema; No deformity  SKIN: Warm and dry NEUROLOGIC:  Alert and oriented x 3 PSYCHIATRIC:  Normal affect    Signed, Joe More, MD  06/18/2018 3:44 PM    [facility]

## 2018-06-18 ENCOUNTER — Ambulatory Visit (INDEPENDENT_AMBULATORY_CARE_PROVIDER_SITE_OTHER): Payer: Medicare Other | Admitting: Cardiology

## 2018-06-18 VITALS — BP 148/74 | HR 74 | Ht 72.0 in | Wt 157.0 lb

## 2018-06-18 DIAGNOSIS — I10 Essential (primary) hypertension: Secondary | ICD-10-CM

## 2018-06-18 DIAGNOSIS — R55 Syncope and collapse: Secondary | ICD-10-CM

## 2018-06-18 DIAGNOSIS — I480 Paroxysmal atrial fibrillation: Secondary | ICD-10-CM | POA: Diagnosis not present

## 2018-06-18 MED ORDER — SODIUM CHLORIDE 1 G PO TABS: 1.0000 g | ORAL_TABLET | Freq: Every day | ORAL | Status: AC

## 2018-06-18 NOTE — Patient Instructions (Signed)
Medication Instructions:  Your physician has recommended you make the following change in your medication:   START over the counter salt tablets: Take 1 tablet daily   If you need a refill on your cardiac medications before your next appointment, please call your pharmacy.   Lab work: None  If you have labs (blood work) drawn today and your tests are completely normal, you will receive your results only by: [name] Kitchen MyChart Message (if you have MyChart) OR . A paper copy in the mail If you have any lab test that is abnormal or we need to change your treatment, we will call you to review the results.  Testing/Procedures: None  Follow-Up: At [facility], you and your health needs are our priority.  As part of our continuing mission to provide you with exceptional heart care, we have created designated Provider Care Teams.  These Care Teams include your primary Cardiologist (physician) and Advanced Practice Providers (APPs -  Physician Assistants and Nurse Practitioners) who all work together to provide you with the care you need, when you need it. . You will need a follow up appointment in 3 months.  Please call our office 2 months in advance to schedule this appointment.   Any Other Special Instructions Will Be Listed Below (If Applicable).  **Please purchase and wear an abdominal binder and support hose.

## 2018-06-23 ENCOUNTER — Ambulatory Visit (HOSPITAL_COMMUNITY)
Admission: RE | Admit: 2018-06-23 | Discharge: 2018-06-23 | Disposition: A | Discharge status: Home / Self Care | Payer: Medicare Other | Source: Ambulatory Visit | Attending: Cardiology | Admitting: Cardiology

## 2018-06-23 DIAGNOSIS — R55 Syncope and collapse: Secondary | ICD-10-CM | POA: Diagnosis present

## 2018-07-26 ENCOUNTER — Emergency Department (HOSPITAL_COMMUNITY): Payer: Medicare Other

## 2018-07-26 ENCOUNTER — Encounter (HOSPITAL_COMMUNITY): Payer: Self-pay | Admitting: Emergency Medicine

## 2018-07-26 ENCOUNTER — Emergency Department (HOSPITAL_COMMUNITY)
Admission: EM | Admit: 2018-07-26 | Discharge: 2018-07-26 | Disposition: A | Discharge status: Home / Self Care | Payer: Medicare Other | Attending: Emergency Medicine | Admitting: Emergency Medicine

## 2018-07-26 DIAGNOSIS — Z79899 Other long term (current) drug therapy: Secondary | ICD-10-CM | POA: Insufficient documentation

## 2018-07-26 DIAGNOSIS — Z85038 Personal history of other malignant neoplasm of large intestine: Secondary | ICD-10-CM | POA: Insufficient documentation

## 2018-07-26 DIAGNOSIS — I10 Essential (primary) hypertension: Secondary | ICD-10-CM | POA: Diagnosis not present

## 2018-07-26 DIAGNOSIS — R55 Syncope and collapse: Secondary | ICD-10-CM | POA: Insufficient documentation

## 2018-07-26 DIAGNOSIS — Z8546 Personal history of malignant neoplasm of prostate: Secondary | ICD-10-CM | POA: Diagnosis not present

## 2018-07-26 DIAGNOSIS — G20 Parkinson's disease: Secondary | ICD-10-CM | POA: Diagnosis not present

## 2018-07-26 LAB — I-STAT CHEM 8, ED
BUN: 16 mg/dL (ref 8–23)
CALCIUM ION: 1.12 mmol/L — AB (ref 1.15–1.40)
Chloride: 94 mmol/L — ABNORMAL LOW (ref 98–111)
Creatinine, Ser: 0.8 mg/dL (ref 0.61–1.24)
GLUCOSE: 108 mg/dL — AB (ref 70–99)
HCT: 40 % (ref 39.0–52.0)
Hemoglobin: 13.6 g/dL (ref 13.0–17.0)
Potassium: 4.1 mmol/L (ref 3.5–5.1)
Sodium: 130 mmol/L — ABNORMAL LOW (ref 135–145)
TCO2: 25 mmol/L (ref 22–32)

## 2018-07-26 LAB — CBC WITH DIFFERENTIAL/PLATELET
Abs Immature Granulocytes: 0.01 10*3/uL (ref 0.00–0.07)
BASOS ABS: 0 10*3/uL (ref 0.0–0.1)
Basophils Relative: 1 %
Eosinophils Absolute: 0.2 10*3/uL (ref 0.0–0.5)
Eosinophils Relative: 3 %
HCT: 39.2 % (ref 39.0–52.0)
Hemoglobin: 12.5 g/dL — ABNORMAL LOW (ref 13.0–17.0)
Immature Granulocytes: 0 %
Lymphocytes Relative: 17 %
Lymphs Abs: 0.9 10*3/uL (ref 0.7–4.0)
MCH: 26.8 pg (ref 26.0–34.0)
MCHC: 31.9 g/dL (ref 30.0–36.0)
MCV: 84.1 fL (ref 80.0–100.0)
Monocytes Absolute: 0.6 10*3/uL (ref 0.1–1.0)
Monocytes Relative: 11 %
Neutro Abs: 3.7 10*3/uL (ref 1.7–7.7)
Neutrophils Relative %: 68 %
PLATELETS: 229 10*3/uL (ref 150–400)
RBC: 4.66 MIL/uL (ref 4.22–5.81)
RDW: 14.4 % (ref 11.5–15.5)
WBC: 5.4 10*3/uL (ref 4.0–10.5)
nRBC: 0 % (ref 0.0–0.2)

## 2018-07-26 LAB — COMPREHENSIVE METABOLIC PANEL
ALT: 16 U/L (ref 0–44)
AST: 19 U/L (ref 15–41)
Albumin: 3.2 g/dL — ABNORMAL LOW (ref 3.5–5.0)
Alkaline Phosphatase: 66 U/L (ref 38–126)
Anion gap: 9 (ref 5–15)
BUN: 14 mg/dL (ref 8–23)
CO2: 23 mmol/L (ref 22–32)
Calcium: 8.4 mg/dL — ABNORMAL LOW (ref 8.9–10.3)
Chloride: 97 mmol/L — ABNORMAL LOW (ref 98–111)
Creatinine, Ser: 0.89 mg/dL (ref 0.61–1.24)
GFR calc non Af Amer: >60 mL/min (ref >60)
Glucose, Bld: 113 mg/dL — ABNORMAL HIGH (ref 70–99)
POTASSIUM: 4.1 mmol/L (ref 3.5–5.1)
Sodium: 129 mmol/L — ABNORMAL LOW (ref 135–145)
Total Bilirubin: 0.5 mg/dL (ref 0.3–1.2)
Total Protein: 6.8 g/dL (ref 6.5–8.1)

## 2018-07-26 LAB — I-STAT CG4 LACTIC ACID, ED: Lactic Acid, Venous: 1.45 mmol/L (ref 0.5–1.9)

## 2018-07-26 LAB — POC OCCULT BLOOD, ED: Fecal Occult Bld: NEGATIVE

## 2018-07-26 LAB — I-STAT TROPONIN, ED: TROPONIN I, POC: 0 ng/mL (ref 0.00–0.08)

## 2018-07-26 MED ORDER — SODIUM CHLORIDE 0.9 % IV BOLUS
500.0000 mL | Freq: Once | INTRAVENOUS | Status: AC
  Administered 2018-07-26: 500 mL via INTRAVENOUS

## 2018-07-26 MED ORDER — TEMAZEPAM 7.5 MG PO CAPS: 7.5000 mg | ORAL_CAPSULE | Freq: Every evening | ORAL | 0 refills | Status: AC | PRN

## 2018-07-26 NOTE — ED Notes (Signed)
Patient verbalized understanding of discharge instructions and denies any further needs or questions at this time. VS stable. Patient ambulatory with steady gait (stand-by assist). Assisted to ED entrance in wheelchair.

## 2018-07-26 NOTE — ED Notes (Signed)
Pt tolerate ambulation, HR stay 89-110bpm and oxygen level 99-100%.

## 2018-07-26 NOTE — ED Notes (Signed)
Patient transported to CT 

## 2018-07-26 NOTE — ED Notes (Signed)
MD at bedside. 

## 2018-07-26 NOTE — ED Notes (Signed)
Pt incontinent of stool. Green diarrhea/mucous sediment with some hard stool balls. Full bed bath given. Pads and sheets changed.

## 2018-07-26 NOTE — ED Provider Notes (Signed)
[facility] EMERGENCY DEPARTMENT Provider Note   CSN: [id number] Arrival date & time: 07/26/18  1305     History   Chief Complaint Chief Complaint  Patient presents with  . Loss of Consciousness    HPI Joe Glover is a 85 y.o. male.  The history is provided by the patient, medical records and the EMS personnel. No language interpreter was used.  Loss of Consciousness   Joe Glover is a 85 y.o. male who presents to the Emergency Department complaining of syncope. He presents to the emergency department for evaluation following a syncopal event. History is provided by the patient and EMS. He was sitting at church when he had a syncopal episode. He was unresponsive for about 10 minutes. He did have a large bowel movement on himself. On EMS arrival he was noted to be lethargic with a blood pressure of 60 systolic. His blood pressure improved without intervention. He has a history of recurrent syncope in the past. On ED arrival he denies any current complaints but states that he passed out. Past Medical History:  Diagnosis Date  . Awareness alteration, transient 11/02/2014  . Cellulitis of right hand 01/27/2017  . Colon cancer ([place])   . Essential hypertension 09/23/2012  . Fall 09/16/2016  . Fall from standing 02/25/2017  . GI bleed   . Hyperlipidemia   . Hypertension   . Insomnia 08/13/2016  . Normocytic anemia 01/27/2017  . On amiodarone therapy 08/06/2015  . Orthostatic hypotension 08/06/2015  . OSA (obstructive sleep apnea)   . PAF (paroxysmal atrial fibrillation) ([place])   . Parkinson disease ([place])   . Prostate cancer ([place])   . SDH (subdural hematoma) ([place]) 09/16/2016  . Status post placement of implantable loop recorder 07/11/2015  . Stroke ([place])   . 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 Subdural hematoma ([place]) 09/05/2016  . Syncope 11/02/2014  . TIA (transient ischemic  attack)   . Vitamin D deficiency     Patient Active Problem List   Diagnosis Date Noted  . Bradycardia 05/14/2018  . Fall from standing 02/25/2017  . Parkinson disease ([place]) 01/27/2017  . Hyponatremia 01/27/2017  . Hypokalemia 01/27/2017  . Cellulitis of right hand 01/27/2017  . Normocytic anemia 01/27/2017  . Fall 09/16/2016  . SDH (subdural hematoma) ([place]) 09/16/2016  . Subdural hematoma ([place]) 09/05/2016  . Insomnia 08/13/2016  . On amiodarone therapy 08/06/2015  . Orthostatic hypotension 08/06/2015  . PAF (paroxysmal atrial fibrillation) ([place]) 08/06/2015  . Status post placement of implantable loop recorder 07/11/2015  . 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 Syncope 11/02/2014  . Awareness alteration, transient 11/02/2014  . OSA (obstructive sleep apnea) 05/31/2014  . Essential hypertension 09/23/2012  . Prostate cancer ([place]) 09/23/2012    Past Surgical History:  Procedure Laterality Date  . COLECTOMY    . HERNIA REPAIR    . ORCHIECTOMY    . PROSTATECTOMY          Home Medications    Prior to Admission medications   Medication Sig Start Date End Date Taking? Authorizing Provider  acetaminophen (TYLENOL) 500 MG tablet Take 500 mg by mouth every 6 (six) hours as needed for moderate pain or headache.    Yes [provider]  Cholecalciferol (VITAMIN D) 50 MCG (2000 UT) tablet Take 2,000-3,000 Units by mouth See admin instructions. Take 1 1/2 tablets (3000  units) by mouth every morning and 1 tablet (2000 unit) at night   Yes [provider]  cyanocobalamin (,VITAMIN B-12,) 1000 MCG/ML injection Inject 1,000 mcg into the muscle every 30 (thirty) days.    Yes [provider]  ferrous sulfate 325 (65 FE) MG tablet Take 325 mg by mouth 2 (two) times daily with a meal.    Yes [provider]  ipratropium (ATROVENT) 0.03 % nasal spray Place 1 spray into the nose 2 (two) times daily. 08/20/17   Yes [provider]  loratadine (CLARITIN) 10 MG tablet Take 10 mg by mouth at bedtime.    Yes [provider]  magnesium oxide (MAG-OX) 400 MG tablet Take 800 mg by mouth 2 (two) times daily.    Yes [provider]  omeprazole (PRILOSEC) 20 MG capsule Take 20 mg by mouth daily.  04/26/14  Yes [provider]  OnabotulinumtoxinA (BOTOX IJ) Inject as directed every 3 (three) months. Next injection due in December 2019   Yes [provider]  potassium chloride (K-DUR,KLOR-CON) 10 MEQ tablet Take 10 mEq by mouth daily.  10/20/14  Yes [provider]  pravastatin (PRAVACHOL) 40 MG tablet Take 40 mg by mouth at bedtime.  05/07/14  Yes [provider]  [place] into the lungs at bedtime. CPAP   Yes [provider]  Probiotic Product (PROBIOTIC DAILY PO) Take 1 tablet by mouth at bedtime.    Yes [provider]  psyllium (METAMUCIL) 58.6 % packet Take 1 packet by mouth 2 (two) times daily.    Yes [provider]  EPINEPHrine 0.3 mg/0.3 mL IJ SOAJ injection Inject 0.3 mg into the muscle daily as needed (allergic reaction).    [provider]  sodium chloride 1 g tablet Take 1 tablet (1 g total) by mouth daily. Patient not taking: Reported on 07/26/2018 06/18/18   Richardo Priest, MD  temazepam (RESTORIL) 7.5 MG capsule Take 1 capsule (7.5 mg total) by mouth at bedtime as needed for sleep. 07/26/18   Quintella Reichert, MD    Family History Family History  Problem Relation Age of Onset  . Parkinson's disease Mother   . Heart attack Father   . Heart attack Brother     Social History Social History   Tobacco Use  . Smoking status: Former Smoker    Packs/day: 2.00    Years: 20.00    Pack years: 40.00    Types: Cigarettes    Last attempt to quit: 07/08/1958    Years since quitting: 60.0  . Smokeless tobacco: Never Used  Substance Use Topics  . Alcohol use: Yes    Alcohol/week: 0.0  standard drinks    Comment: occasional - 1-2 drinks a month  . Drug use: No     Allergies   Anacin [aspirin]; Augmentin [amoxicillin-pot clavulanate]; Hemocyte plus [hematinic plus vit-minerals]; and Penicillins   Review of Systems Review of Systems  Cardiovascular: Positive for syncope.  All other systems reviewed and are negative.    Physical Exam Updated Vital Signs BP (!) 139/92   Pulse 79   Temp (!) 97.5 F (36.4 C) (Oral)   Resp 14   Ht 6' (1.829 m)   Wt 69.4 kg   SpO2 100%   BMI 20.75 kg/m   Physical Exam Vitals signs and nursing note reviewed.  Constitutional:      Appearance: He is well-developed.  HENT:     Head: Normocephalic.     Comments: Abrasion  to left ear lobe Cardiovascular:     Rate and Rhythm: Normal rate and regular rhythm.     Heart sounds: No murmur.  Pulmonary:     Effort: Pulmonary effort is normal. No respiratory distress.     Breath sounds: Normal breath sounds.  Abdominal:     Palpations: Abdomen is soft.     Tenderness: There is no abdominal tenderness. There is no guarding or rebound.  Musculoskeletal:        General: No tenderness.  Skin:    General: Skin is warm and dry.  Neurological:     Mental Status: He is alert and oriented to person, place, and time.     Comments: Mildly dysarthric speech.  5/5 strength in all four extremities with sensation to light touch intact in all four extremities.   Psychiatric:        Behavior: Behavior normal.      ED Treatments / Results  Labs (all labs ordered are listed, but only abnormal results are displayed) Labs Reviewed  COMPREHENSIVE METABOLIC PANEL - Abnormal; Notable for the following components:      Result Value   Sodium 129 (*)    Chloride 97 (*)    Glucose, Bld 113 (*)    Calcium 8.4 (*)    Albumin 3.2 (*)    All other components within normal limits  CBC WITH DIFFERENTIAL/PLATELET - Abnormal; Notable for the following components:   Hemoglobin 12.5 (*)    All other  components within normal limits  I-STAT CHEM 8, ED - Abnormal; Notable for the following components:   Sodium 130 (*)    Chloride 94 (*)    Glucose, Bld 108 (*)    Calcium, Ion 1.12 (*)    All other components within normal limits  POC OCCULT BLOOD, ED  I-STAT TROPONIN, ED  I-STAT CG4 LACTIC ACID, ED  I-STAT CG4 LACTIC ACID, ED    EKG EKG Interpretation  Date/Time:  Sunday July 26 2018 13:26:23 EST Ventricular Rate:  76 PR Interval:    QRS Duration: 126 QT Interval:  431 QTC Calculation: 485 R Axis:   -77 Text Interpretation:  Sinus or ectopic atrial rhythm Nonspecific IVCD with LAD Left ventricular hypertrophy Confirmed by Quintella Reichert [phone]) on 07/26/2018 2:58:42 PM   Radiology Ct Head Wo Contrast  Result Date: 07/26/2018 CLINICAL DATA:  Syncope EXAM: CT HEAD WITHOUT CONTRAST TECHNIQUE: Contiguous axial images were obtained from the base of the skull through the vertex without intravenous contrast. COMPARISON:  January 22, 2017 FINDINGS: Brain: There is moderate diffuse atrophy. There is no intracranial mass, hemorrhage, extra-axial fluid collection, or midline shift. There is extensive small vessel disease throughout the centra semiovale bilaterally. There is small vessel disease in both internal and external capsules as well as in each thalamus region. There is a small lacunar infarct in the lateral right thalamus. There are focal lacunar type infarcts in several sites within the periventricular white matter. No acute infarct is demonstrable on this study. Vascular: No hyperdense vessels are evident. There is fusiform dilatation of portions of the M1 and M2 segments of the left middle cerebral artery, stable. 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 The basilar elsewhere is tortuous. There is calcification in the distal vertebral arteries bilaterally. There is  also calcification in the carotid siphon regions bilaterally and in the proximal left middle  cerebral artery. Skull: The bony calvarium appears intact. Sinuses/Orbits: There is mucosal thickening involving several ethmoid air cells. Other visualized paranasal sinuses are clear. Orbits appear symmetric bilaterally except for previous cataract removal on the right. Other: Mastoids on the right are clear. There is mucosal thickening in several mastoids on the left. IMPRESSION: 1. Moderate diffuse atrophy with extensive supratentorial small vessel disease. Scattered lacunar infarcts noted in the supratentorial white matter as well as in the lateral right thalamus. No acute infarct evident. No mass or acute hemorrhage evident. 2. 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 No periaortic fluid in this area. There is also fusiform dilatation of the visualized left middle cerebral artery, stable. Multiple foci of arterial vascular calcification also noted. 3. Mucosal thickening involving several ethmoid air cells bilaterally noted. There is also mucosal thickening in several mastoid air cells on the left. Electronically Signed   By: Lowella Grip III M.D.   On: 07/26/2018 14:02   Dg Chest Port 1 View  Result Date: 07/26/2018 CLINICAL DATA:  85 year old male with syncope chest pain and shortness of breath. EXAM: PORTABLE CHEST 1 VIEW COMPARISON:  05/31/2018 and earlier. FINDINGS: Portable AP semi upright view at 1324 hours. Stable left chest cardiac event recorder. Tortuous thoracic aorta with calcified atherosclerosis. Stable cardiac size and mediastinal contours. Stable lung volumes. Allowing for portable technique the lungs are clear. Upper lobe emphysema demonstrated by CTA in 2018. Negative visible bowel gas pattern. IMPRESSION: 1. No acute cardiopulmonary abnormality. 2. Aortic Atherosclerosis (ICD10-I70.0) and Emphysema  (ICD10-J43.9). Electronically Signed   By: Genevie Ann M.D.   On: 07/26/2018 13:47    Procedures Ultrasound ED Abd Date/Time: 07/26/2018 4:14 PM Performed by: Quintella Reichert, MD Authorized by: Quintella Reichert, MD   Procedure details:    Indications comment:  Syncope   Assessment for:  AAA   Aorta:  Visualized      Study Limitations: bowel gas Vascular findings:    Aorta: aorta normal (< 3cm)     Intra-abdominal fluid: unidentified     (including critical care time)  Medications Ordered in ED Medications  sodium chloride 0.9 % bolus 500 mL (500 mLs Intravenous New Bag/Given 07/26/18 1425)     Initial Impression / Assessment and Plan / ED Course  I have reviewed the triage vital signs and the nursing notes.  Pertinent labs & imaging results that were available during my care of the patient were reviewed by me and considered in my medical decision making (see chart for details).     Patient here for evaluation following syncopal event, has history of multiple prior similar episodes. He has had multiple inpatient and outpatient workup for syncope. He is compliant with his compression stockings but does not take the salt tablets. He did have hypotension for EMS that improved prior to ED arrival. Labs are near his baseline. CT scan does show slight enlarging of his knwon aneurysm without evidence of rupture. Current presentation is not c/w AAA, PE, ACS, GI bleed. Plan to discharge home with close outpatient neurology, cardiology and PCP follow-up. Will decrease his Restoril. Home care and return precautions discussed.  Final Clinical Impressions(s) / ED Diagnoses   Final diagnoses:  Syncope and collapse    ED Discharge Orders         Ordered    temazepam (RESTORIL) 7.5 MG capsule  At bedtime PRN     07/26/18 1558  Quintella Reichert, MD 07/26/18 [phone]

## 2018-07-26 NOTE — Discharge Instructions (Signed)
Continue to wear your compression stockings and take your salt tablets as directed.  We will decrease your restoril.  You may take it with melatonin at night for sleep.  Drink plenty of fluids.  Your CT scan of your brain showed a slight increase in the size of your aneurysm.  Please follow up with your Neurologist and Primary Care Doctor for recheck.

## 2018-07-26 NOTE — ED Triage Notes (Signed)
Per [facility]: Patient to ED from church after syncopal episode lasting approximately 10 minutes - patient was sitting down, no fall. Per EMS, family states that patient has had episodes of passing out for years, but doctors recently found out his blood pressure was dropping. Recent cardiac workup including [place] monitor and they discussed possibly prescribing medication to increase BP. Upon EMS arrival, systolic BP in 41O - increased to 110/72 without intervention. Patient also incontinent of stool. He denies chest or abdominal pain, no N/V or fevers/chills. EMS VS: HR 71, RR 18, 97% RA, CBG 122. 18g. PIV RAC. A&O x 4.

## 2018-08-18 ENCOUNTER — Ambulatory Visit: Payer: Medicare Other | Admitting: Pulmonary Disease

## 2018-08-18 ENCOUNTER — Encounter: Payer: Self-pay | Admitting: Adult Health

## 2018-08-18 ENCOUNTER — Ambulatory Visit (INDEPENDENT_AMBULATORY_CARE_PROVIDER_SITE_OTHER): Payer: Medicare Other | Admitting: Adult Health

## 2018-08-18 DIAGNOSIS — G4733 Obstructive sleep apnea (adult) (pediatric): Secondary | ICD-10-CM

## 2018-08-18 NOTE — Progress Notes (Signed)
@Patient  ID: Joe Glover, male    DOB: [birth year], 85 y.o.   MRN: [id number]  Chief Complaint  Patient presents with  . Follow-up    Referring provider: [name], MD  HPI:   TEST/EVENTS :   Allergies  Allergen Reactions  . Anacin [Aspirin] Other (See Comments)    GI bleed  . Augmentin [Amoxicillin-Pot Clavulanate] Hives  . Hemocyte Plus [Hematinic Plus Vit-Minerals] Other (See Comments)    Unknown reaction  . Penicillins Hives    Has patient had a PCN reaction causing immediate rash, facial/tongue/throat swelling, SOB or lightheadedness with hypotension: No Has patient had a PCN reaction causing severe rash involving mucus membranes or skin necrosis: Yes Has patient had a PCN reaction that required hospitalization: No Has patient had a PCN reaction occurring within the last 10 years: No If all of the above answers are "NO", then may proceed with Cephalosporin use.    Immunization History  Administered Date(s) Administered  . Influenza, High Dose Seasonal PF 05/08/2017, 04/07/2018  . Influenza,inj,Quad PF,6+ Mos 04/10/2015  . Influenza-Unspecified 04/07/2014  . Pneumococcal Conjugate-13 07/09/2015  . Pneumococcal Polysaccharide-23 06/14/2013    Past Medical History:  Diagnosis Date  . Awareness alteration, transient 11/02/2014  . Cellulitis of right hand 01/27/2017  . Colon cancer ([place])   . Essential hypertension 09/23/2012  . Fall 09/16/2016  . Fall from standing 02/25/2017  . GI bleed   . Hyperlipidemia   . Hypertension   . Insomnia 08/13/2016  . Normocytic anemia 01/27/2017  . On amiodarone therapy 08/06/2015  . Orthostatic hypotension 08/06/2015  . OSA (obstructive sleep apnea)   . PAF (paroxysmal atrial fibrillation) ([place])   . Parkinson disease ([place])   . Prostate cancer ([place])   . SDH (subdural hematoma) ([place]) 09/16/2016  . Status post placement of implantable loop recorder 07/11/2015  . Stroke ([place])   . 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 Subdural hematoma ([place]) 09/05/2016  . Syncope 11/02/2014  . TIA (transient ischemic attack)   . Vitamin D deficiency     Tobacco History: Social History   Tobacco Use  Smoking Status Former Smoker  . Packs/day: 2.00  . Years: 20.00  . Pack years: 40.00  . Types: Cigarettes  . Last attempt to quit: 07/08/1958  . Years since quitting: 60.1  Smokeless Tobacco Never Used   Counseling given: Not Answered   Outpatient Medications Prior to Visit  Medication Sig Dispense Refill  . acetaminophen (TYLENOL) 500 MG tablet Take 500 mg by mouth every 6 (six) hours as needed for moderate pain or headache.     . Cholecalciferol (VITAMIN D) 50 MCG (2000 UT) tablet Take 2,000-3,000 Units by mouth See admin instructions. Take 1 1/2 tablets (3000 units) by mouth every morning and 1 tablet (2000 unit) at night    . cyanocobalamin (,VITAMIN B-12,) 1000 MCG/ML injection Inject 1,000 mcg into the muscle every 30 (thirty) days.     [name] Kitchen EPINEPHrine 0.3 mg/0.3 mL IJ SOAJ injection Inject 0.3 mg into the muscle daily as needed (allergic reaction).    . ferrous sulfate 325 (65 FE) MG tablet Take 325 mg by mouth 2 (two) times daily with a meal.     . ipratropium (ATROVENT) 0.03 % nasal spray Place 1 spray into the nose 2 (two) times daily.    [name] Kitchen loratadine (CLARITIN) 10 MG tablet Take 10 mg by mouth at bedtime.     [name] Kitchen  magnesium oxide (MAG-OX) 400 MG tablet Take 800 mg by mouth 2 (two) times daily.     . Melatonin 5 MG TABS Take 5 mg by mouth at bedtime as needed.    [name] Kitchen omeprazole (PRILOSEC) 20 MG capsule Take 20 mg by mouth daily.   1  . OnabotulinumtoxinA (BOTOX IJ) Inject as directed every 3 (three) months. Next injection due in December 2019    . potassium chloride (K-DUR,KLOR-CON) 10 MEQ tablet Take 10 mEq by mouth daily.   0  . pravastatin (PRAVACHOL) 40 MG tablet Take 40 mg by mouth at bedtime.   1  . PRESCRIPTION MEDICATION  Inhale into the lungs at bedtime. CPAP    . Probiotic Product (PROBIOTIC DAILY PO) Take 1 tablet by mouth at bedtime.     . psyllium (METAMUCIL) 58.6 % packet Take 1 packet by mouth 2 (two) times daily.     . sodium chloride 1 g tablet Take 1 tablet (1 g total) by mouth daily.    . temazepam (RESTORIL) 7.5 MG capsule Take 1 capsule (7.5 mg total) by mouth at bedtime as needed for sleep. 7 capsule 0   No facility-administered medications prior to visit.      Review of Systems:   Constitutional:   No  weight loss, night sweats,  Fevers, chills, fatigue, or  lassitude.  HEENT:   No headaches,  Difficulty swallowing,  Tooth/dental problems, or  Sore throat,                No sneezing, itching, ear ache, nasal congestion, post nasal drip,   CV:  No chest pain,  Orthopnea, PND, swelling in lower extremities, anasarca, dizziness, palpitations, syncope.   GI  No heartburn, indigestion, abdominal pain, nausea, vomiting, diarrhea, change in bowel habits, loss of appetite, bloody stools.   Resp: No shortness of breath with exertion or at rest.  No excess mucus, no productive cough,  No non-productive cough,  No coughing up of blood.  No change in color of mucus.  No wheezing.  No chest wall deformity  Skin: no rash or lesions.  GU: no dysuria, change in color of urine, no urgency or frequency.  No flank pain, no hematuria   MS:  No joint pain or swelling.  No decreased range of motion.  No back pain.    Physical Exam  BP (!) 102/56 (BP Location: Left Arm, Cuff Size: Normal)   Pulse 74   Wt 156 lb 12.8 oz (71.1 kg)   SpO2 97%   BMI 21.27 kg/m   GEN: A/Ox3; pleasant , NAD, well nourished    HEENT:  [place]/AT,  EACs-clear, TMs-wnl, NOSE-clear, THROAT-clear, no lesions, no postnasal drip or exudate noted.   NECK:  Supple w/ fair ROM; no JVD; normal carotid impulses w/o bruits; no thyromegaly or nodules palpated; no lymphadenopathy.    RESP  Clear  P & A; w/o, wheezes/ rales/ or rhonchi. no  accessory muscle use, no dullness to percussion  CARD:  RRR, no m/r/g, no peripheral edema, pulses intact, no cyanosis or clubbing.  GI:   Soft & nt; nml bowel sounds; no organomegaly or masses detected.   Musco: Warm bil, no deformities or joint swelling noted.   Neuro: alert, no focal deficits noted.    Skin: Warm, no lesions or rashes    Lab Results:  CBC    Component Value Date/Time   WBC 5.4 07/26/2018 1335   RBC 4.66 07/26/2018 1335   HGB 13.6 07/26/2018 1352  HCT 40.0 07/26/2018 1352   PLT 229 07/26/2018 1335   MCV 84.1 07/26/2018 1335   MCH 26.8 07/26/2018 1335   MCHC 31.9 07/26/2018 1335   RDW 14.4 07/26/2018 1335   LYMPHSABS 0.9 07/26/2018 1335   MONOABS 0.6 07/26/2018 1335   EOSABS 0.2 07/26/2018 1335   BASOSABS 0.0 07/26/2018 1335    BMET    Component Value Date/Time   NA 130 (L) 07/26/2018 1352   K 4.1 07/26/2018 1352   CL 94 (L) 07/26/2018 1352   CO2 23 07/26/2018 1335   GLUCOSE 108 (H) 07/26/2018 1352   BUN 16 07/26/2018 1352   CREATININE 0.80 07/26/2018 1352   CALCIUM 8.4 (L) 07/26/2018 1335   GFRNONAA >60 07/26/2018 1335   GFRAA >60 07/26/2018 1335    BNP    Component Value Date/Time   BNP 206.7 (H) 05/31/2018 1358    ProBNP No results found for: PROBNP  Imaging: Ct Head Wo Contrast  Result Date: 07/26/2018 CLINICAL DATA:  Syncope EXAM: CT HEAD WITHOUT CONTRAST TECHNIQUE: Contiguous axial images were obtained from the base of the skull through the vertex without intravenous contrast. COMPARISON:  January 22, 2017 FINDINGS: Brain: There is moderate diffuse atrophy. There is no intracranial mass, hemorrhage, extra-axial fluid collection, or midline shift. There is extensive small vessel disease throughout the centra semiovale bilaterally. There is small vessel disease in both internal and external capsules as well as in each thalamus region. There is a small lacunar infarct in the lateral right thalamus. There are focal lacunar type infarcts  in several sites within the periventricular white matter. No acute infarct is demonstrable on this study. Vascular: No hyperdense vessels are evident. There is fusiform dilatation of portions of the M1 and M2 segments of the left middle cerebral artery, stable. 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 The basilar elsewhere is tortuous. There is calcification in the distal vertebral arteries bilaterally. There is also calcification in the carotid siphon regions bilaterally and in the proximal left middle cerebral artery. Skull: The bony calvarium appears intact. Sinuses/Orbits: There is mucosal thickening involving several ethmoid air cells. Other visualized paranasal sinuses are clear. Orbits appear symmetric bilaterally except for previous cataract removal on the right. Other: Mastoids on the right are clear. There is mucosal thickening in several mastoids on the left. IMPRESSION: 1. Moderate diffuse atrophy with extensive supratentorial small vessel disease. Scattered lacunar infarcts noted in the supratentorial white matter as well as in the lateral right thalamus. No acute infarct evident. No mass or acute hemorrhage evident. 2. 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 No periaortic fluid in this area. There is also fusiform dilatation of the visualized left middle cerebral artery, stable. Multiple foci of arterial vascular calcification also noted. 3. Mucosal thickening involving several ethmoid air cells bilaterally noted. There is also mucosal thickening in several mastoid air cells on the left. Electronically Signed   By: Lowella Grip III M.D.   On: 07/26/2018 14:02   Dg Chest Port 1 View  Result Date: 07/26/2018 CLINICAL DATA:  85 year old male with syncope chest pain and shortness of breath. EXAM:  PORTABLE CHEST 1 VIEW COMPARISON:  05/31/2018 and earlier. FINDINGS: Portable AP semi upright view at 1324 hours. Stable left chest cardiac event recorder. Tortuous thoracic aorta with calcified atherosclerosis.  Stable cardiac size and mediastinal contours. Stable lung volumes. Allowing for portable technique the lungs are clear. Upper lobe emphysema demonstrated by CTA in 2018. Negative visible bowel gas pattern. IMPRESSION: 1. No acute cardiopulmonary abnormality. 2. Aortic Atherosclerosis (ICD10-I70.0) and Emphysema (ICD10-J43.9). Electronically Signed   By: Genevie Ann M.D.   On: 07/26/2018 13:47      No flowsheet data found.  No results found for: NITRICOXIDE      Assessment & Plan:   No problem-specific Assessment & Plan notes found for this encounter.     Rexene Edison, NP 08/18/2018

## 2018-08-18 NOTE — Progress Notes (Signed)
@Patient  ID: Joe Glover, male    DOB: 02-02-1925, 85 y.o.   MRN: [id number]  Chief Complaint  Patient presents with  . Follow-up    OSA     Referring provider: [name], MD  HPI: 85 year old male followed for obstructive sleep apnea Past medical history significant for hypertension and Parkinson's  TEST  NPSG 2011 >AHI 8/hr  08/18/2018 Follow up : OSA - Returns for a one-year follow-up.  Patient has underlying sleep apnea.  He is on CPAP.  Patient says he wears his CPAP every night.  He says he feels rested with no significant daytime sleepiness.   was having a lot of mask leaks last visit was sent for a mask fitting.  Patient says his mask is doing well.  Download shows excellent compliance with 100% usage.  Daily average usage at 10.5 hours.  Patient is on auto CPAP 5 to 15 cm H2O.  AHI is 7.5.  Positive leaks.. wife says his temazepam was decreased last week , does notice less events over last week.  Explained that his download shows significant mask leaks however patient says he does not notice this.  And does not wake him up. Feels he is doing well on CPAP and rested.  Accompanied by his wife.   Is undergoing evaluation for recurrent syncope.     Allergies  Allergen Reactions  . Anacin [Aspirin] Other (See Comments)    GI bleed  . Augmentin [Amoxicillin-Pot Clavulanate] Hives  . Hemocyte Plus [Hematinic Plus Vit-Minerals] Other (See Comments)    Unknown reaction  . Penicillins Hives    Has patient had a PCN reaction causing immediate rash, facial/tongue/throat swelling, SOB or lightheadedness with hypotension: No Has patient had a PCN reaction causing severe rash involving mucus membranes or skin necrosis: Yes Has patient had a PCN reaction that required hospitalization: No Has patient had a PCN reaction occurring within the last 10 years: No If all of the above answers are "NO", then may proceed with Cephalosporin use.    Immunization History    Administered Date(s) Administered  . Influenza, High Dose Seasonal PF 05/08/2017, 04/07/2018  . Influenza,inj,Quad PF,6+ Mos 04/10/2015  . Influenza-Unspecified 04/07/2014  . Pneumococcal Conjugate-13 07/09/2015  . Pneumococcal Polysaccharide-23 06/14/2013    Past Medical History:  Diagnosis Date  . Awareness alteration, transient 11/02/2014  . Cellulitis of right hand 01/27/2017  . Colon cancer ([place])   . Essential hypertension 09/23/2012  . Fall 09/16/2016  . Fall from standing 02/25/2017  . GI bleed   . Hyperlipidemia   . Hypertension   . Insomnia 08/13/2016  . Normocytic anemia 01/27/2017  . On amiodarone therapy 08/06/2015  . Orthostatic hypotension 08/06/2015  . OSA (obstructive sleep apnea)   . PAF (paroxysmal atrial fibrillation) ([place])   . Parkinson disease ([place])   . Prostate cancer ([place])   . SDH (subdural hematoma) ([place]) 09/16/2016  . Status post placement of implantable loop recorder 07/11/2015  . Stroke ([place])   . 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 Subdural hematoma ([place]) 09/05/2016  . Syncope 11/02/2014  . TIA (transient ischemic attack)   . Vitamin D deficiency     Tobacco History: Social History   Tobacco Use  Smoking Status Former Smoker  . Packs/day: 2.00  . Years: 20.00  . Pack years: 40.00  . Types: Cigarettes  . Last attempt to quit: 07/08/1958  . Years since  quitting: 60.1  Smokeless Tobacco Never Used   Counseling given: Not Answered   Outpatient Medications Prior to Visit  Medication Sig Dispense Refill  . acetaminophen (TYLENOL) 500 MG tablet Take 500 mg by mouth every 6 (six) hours as needed for moderate pain or headache.     . Cholecalciferol (VITAMIN D) 50 MCG (2000 UT) tablet Take 2,000-3,000 Units by mouth See admin instructions. Take 1 1/2 tablets (3000 units) by mouth every morning and 1 tablet (2000 unit) at night    . cyanocobalamin (,VITAMIN B-12,)  1000 MCG/ML injection Inject 1,000 mcg into the muscle every 30 (thirty) days.     [name] Kitchen EPINEPHrine 0.3 mg/0.3 mL IJ SOAJ injection Inject 0.3 mg into the muscle daily as needed (allergic reaction).    . ferrous sulfate 325 (65 FE) MG tablet Take 325 mg by mouth 2 (two) times daily with a meal.     . ipratropium (ATROVENT) 0.03 % nasal spray Place 1 spray into the nose 2 (two) times daily.    [name] Kitchen loratadine (CLARITIN) 10 MG tablet Take 10 mg by mouth at bedtime.     . magnesium oxide (MAG-OX) 400 MG tablet Take 800 mg by mouth 2 (two) times daily.     . Melatonin 5 MG TABS Take 5 mg by mouth at bedtime as needed.    [name] Kitchen omeprazole (PRILOSEC) 20 MG capsule Take 20 mg by mouth daily.   1  . OnabotulinumtoxinA (BOTOX IJ) Inject as directed every 3 (three) months. Next injection due in December 2019    . potassium chloride (K-DUR,KLOR-CON) 10 MEQ tablet Take 10 mEq by mouth daily.   0  . pravastatin (PRAVACHOL) 40 MG tablet Take 40 mg by mouth at bedtime.   1  . PRESCRIPTION MEDICATION Inhale into the lungs at bedtime. CPAP    . Probiotic Product (PROBIOTIC DAILY PO) Take 1 tablet by mouth at bedtime.     . psyllium (METAMUCIL) 58.6 % packet Take 1 packet by mouth 2 (two) times daily.     . sodium chloride 1 g tablet Take 1 tablet (1 g total) by mouth daily.    . temazepam (RESTORIL) 7.5 MG capsule Take 1 capsule (7.5 mg total) by mouth at bedtime as needed for sleep. 7 capsule 0   No facility-administered medications prior to visit.      Review of Systems:   Constitutional:   No  weight loss, night sweats,  Fevers, chills, fatigue, or  lassitude.  HEENT:   No headaches,  Difficulty swallowing,  Tooth/dental problems, or  Sore throat,                No sneezing, itching, ear ache, nasal congestion, post nasal drip,   CV:  No chest pain,  Orthopnea, PND, swelling in lower extremities, anasarca, dizziness, palpitations, syncope.   GI  No heartburn, indigestion, abdominal pain, nausea, vomiting,  diarrhea, change in bowel habits, loss of appetite, bloody stools.   Resp: No shortness of breath with exertion or at rest.  No excess mucus, no productive cough,  No non-productive cough,  No coughing up of blood.  No change in color of mucus.  No wheezing.  No chest wall deformity  Skin: no rash or lesions.  GU: no dysuria, change in color of urine, no urgency or frequency.  No flank pain, no hematuria   MS:  No joint pain or swelling.  No decreased range of motion.  No back pain.    Physical Exam  BP (!) 102/56 (BP Location: Left Arm, Cuff Size: Normal)   Pulse 74   Wt 156 lb 12.8 oz (71.1 kg)   SpO2 97%   BMI 21.27 kg/m   GEN: A/Ox3; pleasant , NAD, frail and elderly in wc    HEENT:  [place]/AT,  EACs-clear, TMs-wnl, NOSE-clear, THROAT-clear, no lesions, no postnasal drip or exudate noted.   NECK:  Supple w/ fair ROM; no JVD; normal carotid impulses w/o bruits; no thyromegaly or nodules palpated; no lymphadenopathy.    RESP  Clear  P & A; w/o, wheezes/ rales/ or rhonchi. no accessory muscle use, no dullness to percussion  CARD:  RRR, no m/r/g, no peripheral edema, pulses intact, no cyanosis or clubbing.  GI:   Soft & nt; nml bowel sounds; no organomegaly or masses detected.   Musco: Warm bil, no deformities or joint swelling noted.   Neuro: alert, speech is shaky ,dysarthric   Skin: Warm, no lesions or rashes    Lab Results:  CBC  ProBNP No results found for: PROBNP  Imaging:  No flowsheet data found.  No results found for: NITRICOXIDE      Assessment & Plan:   OSA (obstructive sleep apnea) Pt says he feels good on CPAP  Has excellent control . Still has some breakthrough events and mask leaks despite mask fitting .  Will check in 4 weeks with new med change to see if makes a difference .   Plan  Patient Instructions  Continue on CPAP At bedtime  .  Keep up good work .   Follow up with Dr. Elsworth Soho  In 1 year  CPAP download in 1 month and As needed             Rexene Edison, NP 08/18/2018

## 2018-08-18 NOTE — Assessment & Plan Note (Signed)
Pt says he feels good on CPAP  Has excellent control . Still has some breakthrough events and mask leaks despite mask fitting .  Will check in 4 weeks with new med change to see if makes a difference .   Plan  Patient Instructions  Continue on CPAP At bedtime  .  Keep up good work .   Follow up with Dr. Elsworth Soho  In 1 year  CPAP download in 1 month and As needed

## 2018-08-18 NOTE — Patient Instructions (Signed)
Continue on CPAP At bedtime  .  Keep up good work .   Follow up with Dr. Elsworth Soho  In 1 year  CPAP download in 1 month and As needed

## 2018-09-15 NOTE — Progress Notes (Signed)
Cardiology Office Note:    Date:  09/16/2018   ID:  Joe Glover, DOB [birth year], MRN [id number]  PCP:  [name], MD  Cardiologist:  Shirlee More, MD    Referring MD: [name], MD  ASSESSMENT:    1. Syncope, unspecified syncope type   2. Orthostatic hypotension   3. Sick sinus syndrome (HCC)   4. Parkinson disease ([place])    PLAN:    In order of problems listed above:  1. He is having recurrent syncope with hypotension in the setting of Parkinson's disease.  We try conservative measures I can think of nothing else beneficial than a clinical trial of ProAmatine and they will discuss with her neurologist when seen next week 2. Stable there is nothing to indicate that bradycardia is the mechanism reviewed benefit with pacemaker 3. Progressive you be seen by neurology in the next few weeks  Next appointment: 3 months   Medication Adjustments/Labs and Tests Ordered: Current medicines are reviewed at length with the patient today.  Concerns regarding medicines are outlined above.  No orders of the defined types were placed in this encounter.  No orders of the defined types were placed in this encounter.   Chief Complaint  Patient presents with  . Follow-up    after another syncopal episode    History of Present Illness:    Joe Glover is a 85 y.o. male with a hx of HTN, HLD, colon cancer (S/P colectomy), prostate cancer (S/P prostatectomy and orchiectomy), GERD, severe right knee OA, vascular Parkinsonism with baseline mild dysarthria, OSA on CPAP, paroxysmal A. fib (not on anticoagulation; prior GI bleed in 2014 requiring 16 units PRBCs) and recurrent syncopal episodes.  His last loop recorder download 02/09/2018 shows brief episodes of atrial fibrillation.  He has been at device [facility] since April 2019.  he was last seen 05/14/18. Compliance with diet, lifestyle and medications: yes  Unfortunately another profound episode of syncope again occurs at  church.  When the ambulance arrived his rate was in the 60s but he was quite hypotensive my time he got to the emergency room IV fluids and recovered.  His wife meticulously monitors him at home he is not hypotensive at baseline he has no significant orthostatic hypotension adding salt to his diet and support hose and cannot wear an abdominal binder because it depends.  We feel conclusively that this is not bradycardic he was wearing a monitor during the last episode and clinically has had no recurrent atrial fibrillation.  He is having increasing ambulatory difficulty and his neuro neurologic disease Parkinson's has progressed.  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 Tamsen Roers in [place] at the end of the month and discussed with him  Past Medical History:  Diagnosis Date  . Awareness alteration, transient 11/02/2014  . Cellulitis of right hand 01/27/2017  . Colon cancer ([place])   . Essential hypertension 09/23/2012  . Fall 09/16/2016  . Fall from standing 02/25/2017  . GI bleed   . Hyperlipidemia   . Hypertension   . Insomnia 08/13/2016  . Normocytic anemia 01/27/2017  . On amiodarone therapy 08/06/2015  . Orthostatic hypotension 08/06/2015  . OSA (obstructive sleep apnea)   . PAF (paroxysmal atrial fibrillation) ([place])   . Parkinson disease ([place])   . Prostate cancer ([place])   . SDH (subdural hematoma) ([place]) 09/16/2016  .  Status post placement of implantable loop recorder 07/11/2015  . Stroke ([place])   . 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 Subdural hematoma ([place]) 09/05/2016  . Syncope 11/02/2014  . TIA (transient ischemic attack)   . Vitamin D deficiency     Past Surgical History:  Procedure Laterality Date  . COLECTOMY    . HERNIA REPAIR    .  ORCHIECTOMY    . PROSTATECTOMY      Current Medications: Current Meds  Medication Sig  . acetaminophen (TYLENOL) 500 MG tablet Take 500 mg by mouth every 6 (six) hours as needed for moderate pain or headache.   . Cholecalciferol (VITAMIN D) 50 MCG (2000 UT) tablet Take 2,000-3,000 Units by mouth See admin instructions. Take 1 1/2 tablets (3000 units) by mouth every morning and 1 tablet (2000 unit) at night  . cyanocobalamin (,VITAMIN B-12,) 1000 MCG/ML injection Inject 1,000 mcg into the muscle every 30 (thirty) days.   [name] Kitchen EPINEPHrine 0.3 mg/0.3 mL IJ SOAJ injection Inject 0.3 mg into the muscle daily as needed (allergic reaction).  . ferrous sulfate 325 (65 FE) MG tablet Take 325 mg by mouth 2 (two) times daily with a meal.   . ipratropium (ATROVENT) 0.03 % nasal spray Place 1 spray into the nose 2 (two) times daily.  [name] Kitchen loratadine (CLARITIN) 10 MG tablet Take 10 mg by mouth at bedtime.   . magnesium oxide (MAG-OX) 400 MG tablet Take 800 mg by mouth 2 (two) times daily.   . Melatonin 5 MG TABS Take 5 mg by mouth at bedtime as needed.  [name] Kitchen omeprazole (PRILOSEC) 20 MG capsule Take 20 mg by mouth daily.   . OnabotulinumtoxinA (BOTOX IJ) Inject as directed every 3 (three) months. Next injection due in December 2019  . potassium chloride SA (K-DUR,KLOR-CON) 20 MEQ tablet Take 10 mEq by mouth 2 (two) times daily.  . pravastatin (PRAVACHOL) 40 MG tablet Take 40 mg by mouth at bedtime.   [name] Kitchen PRESCRIPTION MEDICATION Inhale into the lungs at bedtime. CPAP  . Probiotic Product (PROBIOTIC DAILY PO) Take 1 tablet by mouth at bedtime.   . psyllium (METAMUCIL) 58.6 % packet Take 1 packet by mouth 2 (two) times daily.   . sodium chloride 1 g tablet Take 1 tablet (1 g total) by mouth daily.  . temazepam (RESTORIL) 7.5 MG capsule Take 1 capsule (7.5 mg total) by mouth at bedtime as needed for sleep.     Allergies:   Anacin [aspirin]; Augmentin [amoxicillin-pot clavulanate]; Hemocyte plus [hematinic plus  vit-minerals]; and Penicillins   Social History   Socioeconomic History  . Marital status: Married    Spouse name: Not on file  . Number of children: 2  . Years of education: Not on file  . Highest education level: Not on file  Occupational History  . Occupation: retired  Scientific laboratory technician  . Financial resource strain: Not on file  . Food insecurity:    Worry: Not on file    Inability: Not on file  . Transportation needs:    Medical: Not on file    Non-medical: Not on file  Tobacco Use  . Smoking status: Former Smoker    Packs/day: 2.00    Years: 20.00    Pack years: 40.00    Types: Cigarettes    Last attempt to quit: 07/08/1958    Years since quitting: 60.2  . Smokeless tobacco: Never Used  Substance and Sexual Activity  . Alcohol use: Yes    Alcohol/week: 0.0 standard drinks    Comment: occasional - 1-2 drinks a month  . Drug use: No  . Sexual activity: Not on file  Lifestyle  . Physical activity:    Days per week: Not on file    Minutes per session: Not on file  . Stress: Not on file  Relationships  . Social connections:    Talks on phone: Not on file    Gets together: Not on file    Attends religious service: Not on file    Active member of club or organization: Not on file    Attends meetings of clubs or organizations: Not on file    Relationship status: Not on file  Other Topics Concern  . Not on file  Social History Narrative  . Not on file     Family History: The patient's family history includes Heart attack in his brother and father; Parkinson's disease in his mother. ROS:   Please see the history of present illness.    All other systems reviewed and are negative.  EKGs/Labs/Other Studies Reviewed:    The following studies were reviewed today:  EKG:   Independently reviewed 07/26/2018 sinus rhythm Recent Labs: 05/31/2018: B Natriuretic Peptide 206.7 07/26/2018: ALT 16; BUN 16; Creatinine, Ser 0.80; Hemoglobin 13.6; Platelets 229; Potassium 4.1;  Sodium 130  Recent Lipid Panel No results found for: CHOL, TRIG, HDL, CHOLHDL, VLDL, LDLCALC, LDLDIRECT  Physical Exam:    VS:  BP 132/84 (BP Location: Right Arm, Patient Position: Sitting, Cuff Size: Normal)   Pulse 74   Ht 6' (1.829 m)   Wt 158 lb (71.7 kg)   SpO2 97%   BMI 21.43 kg/m     Wt Readings from Last 3 Encounters:  09/16/18 158 lb (71.7 kg)  08/18/18 156 lb 12.8 oz (71.1 kg)  07/26/18 153 lb (69.4 kg)     GEN:  Well nourished, well developed in no acute distress HEENT: Normal NECK: No JVD; No carotid bruits LYMPHATICS: No lymphadenopathy CARDIAC: RRR, no murmurs, rubs, gallops RESPIRATORY:  Clear to auscultation without rales, wheezing or rhonchi  ABDOMEN: Soft, non-tender, non-distended MUSCULOSKELETAL:  No edema; No deformity  SKIN: Warm and dry NEUROLOGIC:  Alert and oriented x 3 PSYCHIATRIC:  Normal affect    Signed, Shirlee More, MD  09/16/2018 12:16 PM    [place]

## 2018-09-16 ENCOUNTER — Other Ambulatory Visit: Payer: Self-pay

## 2018-09-16 ENCOUNTER — Ambulatory Visit (INDEPENDENT_AMBULATORY_CARE_PROVIDER_SITE_OTHER): Payer: Medicare Other | Admitting: Cardiology

## 2018-09-16 ENCOUNTER — Encounter: Payer: Self-pay | Admitting: Cardiology

## 2018-09-16 VITALS — BP 132/84 | HR 74 | Ht 72.0 in | Wt 158.0 lb

## 2018-09-16 DIAGNOSIS — R55 Syncope and collapse: Secondary | ICD-10-CM | POA: Diagnosis not present

## 2018-09-16 DIAGNOSIS — G20A1 Parkinson's disease without dyskinesia, without mention of fluctuations: Secondary | ICD-10-CM

## 2018-09-16 DIAGNOSIS — I495 Sick sinus syndrome: Secondary | ICD-10-CM

## 2018-09-16 DIAGNOSIS — G20 Parkinson's disease: Secondary | ICD-10-CM | POA: Diagnosis not present

## 2018-09-16 DIAGNOSIS — I951 Orthostatic hypotension: Secondary | ICD-10-CM

## 2018-09-16 NOTE — Patient Instructions (Signed)

## 2018-11-09 ENCOUNTER — Inpatient Hospital Stay (HOSPITAL_COMMUNITY)
Admission: EM | Admit: 2018-11-09 | Discharge: 2018-11-14 | DRG: 470 | Disposition: A | Discharge status: Home Health | Payer: Medicare Other | Attending: Internal Medicine | Admitting: Internal Medicine

## 2018-11-09 ENCOUNTER — Other Ambulatory Visit: Payer: Self-pay

## 2018-11-09 ENCOUNTER — Encounter (HOSPITAL_COMMUNITY): Payer: Self-pay | Admitting: Emergency Medicine

## 2018-11-09 ENCOUNTER — Emergency Department (HOSPITAL_COMMUNITY): Payer: Medicare Other

## 2018-11-09 DIAGNOSIS — E871 Hypo-osmolality and hyponatremia: Secondary | ICD-10-CM | POA: Diagnosis present

## 2018-11-09 DIAGNOSIS — W109XXA Fall (on) (from) unspecified stairs and steps, initial encounter: Secondary | ICD-10-CM | POA: Diagnosis present

## 2018-11-09 DIAGNOSIS — E785 Hyperlipidemia, unspecified: Secondary | ICD-10-CM | POA: Diagnosis present

## 2018-11-09 DIAGNOSIS — Z66 Do not resuscitate: Secondary | ICD-10-CM | POA: Diagnosis present

## 2018-11-09 DIAGNOSIS — E876 Hypokalemia: Secondary | ICD-10-CM | POA: Diagnosis present

## 2018-11-09 DIAGNOSIS — Z87891 Personal history of nicotine dependence: Secondary | ICD-10-CM

## 2018-11-09 DIAGNOSIS — F5101 Primary insomnia: Secondary | ICD-10-CM | POA: Diagnosis not present

## 2018-11-09 DIAGNOSIS — S72001A Fracture of unspecified part of neck of right femur, initial encounter for closed fracture: Secondary | ICD-10-CM | POA: Diagnosis present

## 2018-11-09 DIAGNOSIS — G20 Parkinson's disease: Secondary | ICD-10-CM | POA: Diagnosis present

## 2018-11-09 DIAGNOSIS — G4733 Obstructive sleep apnea (adult) (pediatric): Secondary | ICD-10-CM | POA: Diagnosis present

## 2018-11-09 DIAGNOSIS — Y92018 Other place in single-family (private) house as the place of occurrence of the external cause: Secondary | ICD-10-CM | POA: Diagnosis not present

## 2018-11-09 DIAGNOSIS — G4700 Insomnia, unspecified: Secondary | ICD-10-CM | POA: Diagnosis present

## 2018-11-09 DIAGNOSIS — I951 Orthostatic hypotension: Secondary | ICD-10-CM | POA: Diagnosis present

## 2018-11-09 DIAGNOSIS — I10 Essential (primary) hypertension: Secondary | ICD-10-CM

## 2018-11-09 DIAGNOSIS — Z1159 Encounter for screening for other viral diseases: Secondary | ICD-10-CM | POA: Diagnosis not present

## 2018-11-09 DIAGNOSIS — I482 Chronic atrial fibrillation, unspecified: Secondary | ICD-10-CM | POA: Diagnosis not present

## 2018-11-09 DIAGNOSIS — Y9301 Activity, walking, marching and hiking: Secondary | ICD-10-CM | POA: Diagnosis present

## 2018-11-09 DIAGNOSIS — I5033 Acute on chronic diastolic (congestive) heart failure: Secondary | ICD-10-CM | POA: Diagnosis not present

## 2018-11-09 DIAGNOSIS — Z8673 Personal history of transient ischemic attack (TIA), and cerebral infarction without residual deficits: Secondary | ICD-10-CM

## 2018-11-09 DIAGNOSIS — I110 Hypertensive heart disease with heart failure: Secondary | ICD-10-CM | POA: Diagnosis present

## 2018-11-09 DIAGNOSIS — Z96649 Presence of unspecified artificial hip joint: Secondary | ICD-10-CM

## 2018-11-09 DIAGNOSIS — M25551 Pain in right hip: Secondary | ICD-10-CM | POA: Diagnosis present

## 2018-11-09 DIAGNOSIS — I480 Paroxysmal atrial fibrillation: Secondary | ICD-10-CM | POA: Diagnosis present

## 2018-11-09 DIAGNOSIS — Z01810 Encounter for preprocedural cardiovascular examination: Secondary | ICD-10-CM | POA: Diagnosis not present

## 2018-11-09 DIAGNOSIS — M25559 Pain in unspecified hip: Secondary | ICD-10-CM

## 2018-11-09 DIAGNOSIS — Z79899 Other long term (current) drug therapy: Secondary | ICD-10-CM

## 2018-11-09 DIAGNOSIS — Z85038 Personal history of other malignant neoplasm of large intestine: Secondary | ICD-10-CM | POA: Diagnosis not present

## 2018-11-09 DIAGNOSIS — G909 Disorder of the autonomic nervous system, unspecified: Secondary | ICD-10-CM | POA: Diagnosis present

## 2018-11-09 DIAGNOSIS — Z8546 Personal history of malignant neoplasm of prostate: Secondary | ICD-10-CM | POA: Diagnosis not present

## 2018-11-09 DIAGNOSIS — I5032 Chronic diastolic (congestive) heart failure: Secondary | ICD-10-CM | POA: Diagnosis present

## 2018-11-09 LAB — CBC WITH DIFFERENTIAL/PLATELET
Abs Immature Granulocytes: 0.03 10*3/uL (ref 0.00–0.07)
Basophils Absolute: 0 10*3/uL (ref 0.0–0.1)
Basophils Relative: 0 %
Eosinophils Absolute: 0 10*3/uL (ref 0.0–0.5)
Eosinophils Relative: 1 %
HCT: 41.3 % (ref 39.0–52.0)
Hemoglobin: 13.8 g/dL (ref 13.0–17.0)
Immature Granulocytes: 0 %
Lymphocytes Relative: 12 %
Lymphs Abs: 0.9 10*3/uL (ref 0.7–4.0)
MCH: 28.2 pg (ref 26.0–34.0)
MCHC: 33.4 g/dL (ref 30.0–36.0)
MCV: 84.5 fL (ref 80.0–100.0)
Monocytes Absolute: 0.8 10*3/uL (ref 0.1–1.0)
Monocytes Relative: 11 %
Neutro Abs: 5.8 10*3/uL (ref 1.7–7.7)
Neutrophils Relative %: 76 %
Platelets: 207 10*3/uL (ref 150–400)
RBC: 4.89 MIL/uL (ref 4.22–5.81)
RDW: 15 % (ref 11.5–15.5)
WBC: 7.6 10*3/uL (ref 4.0–10.5)
nRBC: 0 % (ref 0.0–0.2)

## 2018-11-09 LAB — COMPREHENSIVE METABOLIC PANEL
ALT: 47 U/L — ABNORMAL HIGH (ref 0–44)
AST: 42 U/L — ABNORMAL HIGH (ref 15–41)
Albumin: 3.4 g/dL — ABNORMAL LOW (ref 3.5–5.0)
Alkaline Phosphatase: 71 U/L (ref 38–126)
Anion gap: 12 (ref 5–15)
BUN: 23 mg/dL (ref 8–23)
CO2: 22 mmol/L (ref 22–32)
Calcium: 8.4 mg/dL — ABNORMAL LOW (ref 8.9–10.3)
Chloride: 95 mmol/L — ABNORMAL LOW (ref 98–111)
Creatinine, Ser: 0.97 mg/dL (ref 0.61–1.24)
GFR calc Af Amer: >60 mL/min (ref >60)
GFR calc non Af Amer: >60 mL/min (ref >60)
Glucose, Bld: 116 mg/dL — ABNORMAL HIGH (ref 70–99)
Potassium: 3.4 mmol/L — ABNORMAL LOW (ref 3.5–5.1)
Sodium: 129 mmol/L — ABNORMAL LOW (ref 135–145)
Total Bilirubin: 1.2 mg/dL (ref 0.3–1.2)
Total Protein: 7.6 g/dL (ref 6.5–8.1)

## 2018-11-09 LAB — TYPE AND SCREEN
ABO/RH(D): O POS
Antibody Screen: NEGATIVE

## 2018-11-09 LAB — ABO/RH: ABO/RH(D): O POS

## 2018-11-09 LAB — SARS CORONAVIRUS 2 BY RT PCR (HOSPITAL ORDER, PERFORMED IN ~~LOC~~ HOSPITAL LAB): SARS Coronavirus 2: NEGATIVE

## 2018-11-09 MED ORDER — MIDODRINE HCL 2.5 MG PO TABS
2.5000 mg | ORAL_TABLET | Freq: Two times a day (BID) | ORAL | Status: DC
  Administered 2018-11-10 – 2018-11-14 (×8): 2.5 mg via ORAL
  Filled 2018-11-09 (×10): qty 1

## 2018-11-09 MED ORDER — MELATONIN 5 MG PO TABS
5.0000 mg | ORAL_TABLET | Freq: Every evening | ORAL | Status: DC | PRN
  Administered 2018-11-11 – 2018-11-13 (×3): 5 mg via ORAL
  Filled 2018-11-09 (×4): qty 1

## 2018-11-09 MED ORDER — MAGNESIUM OXIDE 400 (241.3 MG) MG PO TABS
800.0000 mg | ORAL_TABLET | Freq: Two times a day (BID) | ORAL | Status: DC
  Administered 2018-11-09 – 2018-11-14 (×10): 800 mg via ORAL
  Filled 2018-11-09 (×10): qty 2

## 2018-11-09 MED ORDER — IPRATROPIUM BROMIDE 0.03 % NA SOLN
1.0000 | Freq: Two times a day (BID) | NASAL | Status: DC
  Administered 2018-11-09 – 2018-11-14 (×7): 1 via NASAL
  Filled 2018-11-09: qty 30

## 2018-11-09 MED ORDER — SENNA 8.6 MG PO TABS
1.0000 | ORAL_TABLET | Freq: Two times a day (BID) | ORAL | Status: DC
  Administered 2018-11-09 – 2018-11-14 (×9): 8.6 mg via ORAL
  Filled 2018-11-09 (×9): qty 1

## 2018-11-09 MED ORDER — DOCUSATE SODIUM 100 MG PO CAPS
100.0000 mg | ORAL_CAPSULE | Freq: Two times a day (BID) | ORAL | Status: DC
  Administered 2018-11-09 – 2018-11-14 (×9): 100 mg via ORAL
  Filled 2018-11-09 (×9): qty 1

## 2018-11-09 MED ORDER — MORPHINE SULFATE (PF) 2 MG/ML IV SOLN
0.5000 mg | INTRAVENOUS | Status: DC | PRN
  Administered 2018-11-12 (×2): 0.5 mg via INTRAVENOUS
  Filled 2018-11-09 (×2): qty 1

## 2018-11-09 MED ORDER — PANTOPRAZOLE SODIUM 40 MG PO TBEC
40.0000 mg | DELAYED_RELEASE_TABLET | Freq: Every day | ORAL | Status: DC
  Administered 2018-11-10 – 2018-11-14 (×5): 40 mg via ORAL
  Filled 2018-11-09 (×6): qty 1

## 2018-11-09 MED ORDER — HYDROCODONE-ACETAMINOPHEN 5-325 MG PO TABS: 1.0000 | ORAL_TABLET | Freq: Four times a day (QID) | ORAL | Status: DC | PRN

## 2018-11-09 MED ORDER — LORATADINE 10 MG PO TABS
10.0000 mg | ORAL_TABLET | Freq: Every day | ORAL | Status: DC
  Administered 2018-11-09 – 2018-11-13 (×5): 10 mg via ORAL
  Filled 2018-11-09 (×5): qty 1

## 2018-11-09 MED ORDER — POTASSIUM CHLORIDE CRYS ER 10 MEQ PO TBCR
10.0000 meq | EXTENDED_RELEASE_TABLET | Freq: Every day | ORAL | Status: DC
  Administered 2018-11-09 – 2018-11-11 (×3): 10 meq via ORAL
  Filled 2018-11-09 (×3): qty 1

## 2018-11-09 MED ORDER — TEMAZEPAM 7.5 MG PO CAPS: 7.5000 mg | ORAL_CAPSULE | Freq: Every evening | ORAL | Status: DC | PRN

## 2018-11-09 MED ORDER — SODIUM CHLORIDE 1 G PO TABS
1.0000 g | ORAL_TABLET | Freq: Every day | ORAL | Status: DC
  Administered 2018-11-09 – 2018-11-14 (×5): 1 g via ORAL
  Filled 2018-11-09 (×5): qty 1

## 2018-11-09 MED ORDER — PRAVASTATIN SODIUM 40 MG PO TABS
40.0000 mg | ORAL_TABLET | Freq: Every day | ORAL | Status: DC
  Administered 2018-11-09 – 2018-11-13 (×5): 40 mg via ORAL
  Filled 2018-11-09: qty 2
  Filled 2018-11-09 (×3): qty 1
  Filled 2018-11-09: qty 2

## 2018-11-09 MED ORDER — ENOXAPARIN SODIUM 40 MG/0.4ML ~~LOC~~ SOLN
40.0000 mg | Freq: Every day | SUBCUTANEOUS | Status: AC
  Administered 2018-11-09 – 2018-11-10 (×2): 40 mg via SUBCUTANEOUS
  Filled 2018-11-09 (×2): qty 0.4

## 2018-11-09 MED ORDER — METOPROLOL TARTRATE 5 MG/5ML IV SOLN
2.5000 mg | INTRAVENOUS | Status: DC | PRN
  Administered 2018-11-09 – 2018-11-10 (×2): 2.5 mg via INTRAVENOUS
  Filled 2018-11-09 (×3): qty 5

## 2018-11-09 NOTE — ED Notes (Signed)
EDP at bedside  

## 2018-11-09 NOTE — ED Notes (Signed)
Pt wife [place] requesting to be contacted and updated on POC -contact numbers : [phone]; [phone]

## 2018-11-09 NOTE — ED Triage Notes (Signed)
Per wife pt sat in floor last week. Today had an xray done in [place] which showed fractured right hip and was instructed to go to [facility] ED.

## 2018-11-09 NOTE — ED Provider Notes (Signed)
[facility] DEPT Provider Note   CSN: [id number] Arrival date & time: 11/09/18  1446    History   Chief Complaint Chief Complaint  Patient presents with  . Hip Injury    right    HPI Joe Glover is a 85 y.o. male.     The history is provided by the patient, medical records and the spouse. No language interpreter was used.   Joe Glover is a 85 y.o. male who presents to the Emergency Department complaining of hip injury. He presents to the emergency department accompanied by his wife for evaluation of hip pain following an injury on Wednesday of last week (five days ago). His wife states that he sat down on the ground on the right side. No head injury. Family was able to get him up at that time. Since that time he is required to person assist on ambulation. He complains of pain to his right hip. No reports of fevers, nausea, vomiting, shortness of breath, cough. No known COVID contacts. He lives at home with his wife and ambulates with a walker at baseline. He has a history of Parkinson's disease, which limits his speech. Past Medical History:  Diagnosis Date  . Awareness alteration, transient 11/02/2014  . Cellulitis of right hand 01/27/2017  . Colon cancer ([place])   . Essential hypertension 09/23/2012  . Fall 09/16/2016  . Fall from standing 02/25/2017  . GI bleed   . Hyperlipidemia   . Hypertension   . Insomnia 08/13/2016  . Normocytic anemia 01/27/2017  . On amiodarone therapy 08/06/2015  . Orthostatic hypotension 08/06/2015  . OSA (obstructive sleep apnea)   . PAF (paroxysmal atrial fibrillation) ([place])   . Parkinson disease ([place])   . Prostate cancer ([place])   . SDH (subdural hematoma) ([place]) 09/16/2016  . Status post placement of implantable loop recorder 07/11/2015  . Stroke ([place])   . 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 Subdural hematoma ([place]) 09/05/2016   . Syncope 11/02/2014  . TIA (transient ischemic attack)   . Vitamin D deficiency     Patient Active Problem List   Diagnosis Date Noted  . Sick sinus syndrome ([place]) 09/16/2018  . Bradycardia 05/14/2018  . Fall from standing 02/25/2017  . Parkinson disease ([place]) 01/27/2017  . Hyponatremia 01/27/2017  . Hypokalemia 01/27/2017  . Cellulitis of right hand 01/27/2017  . Normocytic anemia 01/27/2017  . Fall 09/16/2016  . SDH (subdural hematoma) ([place]) 09/16/2016  . Subdural hematoma ([place]) 09/05/2016  . Insomnia 08/13/2016  . On amiodarone therapy 08/06/2015  . Orthostatic hypotension 08/06/2015  . PAF (paroxysmal atrial fibrillation) ([place]) 08/06/2015  . Status post placement of implantable loop recorder 07/11/2015  . 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 Syncope 11/02/2014  . Awareness alteration, transient 11/02/2014  . OSA (obstructive sleep apnea) 05/31/2014  . Essential hypertension 09/23/2012  . Prostate cancer ([place]) 09/23/2012    Past Surgical History:  Procedure Laterality Date  . COLECTOMY    . HERNIA REPAIR    . ORCHIECTOMY    . PROSTATECTOMY          Home Medications    Prior to Admission medications   Medication Sig Start Date End Date Taking? Authorizing Provider  acetaminophen (TYLENOL) 500 MG tablet Take 500 mg by mouth every 6 (six) hours as needed for moderate pain or headache.  [provider]  Cholecalciferol (VITAMIN D) 50 MCG (2000 UT) tablet Take 2,000-3,000 Units by mouth See admin instructions. Take 1 1/2 tablets (3000 units) by mouth every morning and 1 tablet (2000 unit) at night    [provider]  cyanocobalamin (,VITAMIN B-12,) 1000 MCG/ML injection Inject 1,000 mcg into the muscle every 30 (thirty) days.     [provider]  EPINEPHrine 0.3 mg/0.3 mL IJ SOAJ injection Inject 0.3 mg into the muscle daily as needed (allergic reaction).    [provider]   ferrous sulfate 325 (65 FE) MG tablet Take 325 mg by mouth 2 (two) times daily with a meal.     [provider]  ipratropium (ATROVENT) 0.03 % nasal spray Place 1 spray into the nose 2 (two) times daily. 08/20/17   [provider]  loratadine (CLARITIN) 10 MG tablet Take 10 mg by mouth at bedtime.     [provider]  magnesium oxide (MAG-OX) 400 MG tablet Take 800 mg by mouth 2 (two) times daily.     [provider]  Melatonin 5 MG TABS Take 5 mg by mouth at bedtime as needed.    [provider]  omeprazole (PRILOSEC) 20 MG capsule Take 20 mg by mouth daily.  04/26/14   [provider]  OnabotulinumtoxinA (BOTOX IJ) Inject as directed every 3 (three) months. Next injection due in December 2019    [provider]  potassium chloride SA (K-DUR,KLOR-CON) 20 MEQ tablet Take 10 mEq by mouth 2 (two) times daily. 08/04/18   [provider]  pravastatin (PRAVACHOL) 40 MG tablet Take 40 mg by mouth at bedtime.  05/07/14   [provider]  PRESCRIPTION MEDICATION Inhale into the lungs at bedtime. CPAP    [provider]  Probiotic Product (PROBIOTIC DAILY PO) Take 1 tablet by mouth at bedtime.     [provider]  psyllium (METAMUCIL) 58.6 % packet Take 1 packet by mouth 2 (two) times daily.     [provider]  sodium chloride 1 g tablet Take 1 tablet (1 g total) by mouth daily. 06/18/18   Richardo Priest, MD  temazepam (RESTORIL) 7.5 MG capsule Take 1 capsule (7.5 mg total) by mouth at bedtime as needed for sleep. 07/26/18   Quintella Reichert, MD    Family History Family History  Problem Relation Age of Onset  . Parkinson's disease Mother   . Heart attack Father   . Heart attack Brother     Social History Social History   Tobacco Use  . Smoking status: Former Smoker    Packs/day: 2.00    Years: 20.00    Pack years: 40.00    Types: Cigarettes    Last attempt to quit: 07/08/1958    Years  since quitting: 60.3  . Smokeless tobacco: Never Used  Substance Use Topics  . Alcohol use: Yes    Alcohol/week: 0.0 standard drinks    Comment: occasional - 1-2 drinks a month  . Drug use: No     Allergies   Anacin [aspirin]; Augmentin [amoxicillin-pot clavulanate]; Hemocyte plus [hematinic plus vit-minerals]; and Penicillins   Review of Systems Review of Systems  All other systems reviewed and are negative.    Physical Exam Updated Vital Signs Temp 97.6 F (36.4 C) (Oral)   Physical Exam Vitals signs and nursing note reviewed.  Constitutional:      Appearance: He is well-developed.  HENT:     Head: Normocephalic and atraumatic.  Mouth/Throat:     Mouth: Mucous membranes are moist.  Cardiovascular:     Rate and Rhythm: Normal rate and regular rhythm.     Heart sounds: No murmur.  Pulmonary:     Effort: Pulmonary effort is normal. No respiratory distress.     Breath sounds: Normal breath sounds.  Abdominal:     Palpations: Abdomen is soft.     Tenderness: There is no abdominal tenderness. There is no guarding or rebound.  Musculoskeletal:     Comments: 2+ DP pulses bilaterally. TTP over right hip.  Effusion to right knee without local tenderness.    Skin:    General: Skin is warm and dry.     Capillary Refill: Capillary refill takes less than 2 seconds.  Neurological:     Mental Status: He is alert.     Comments: Dysarthric and slow speech. Five out of five strength in all four extremities  Psychiatric:        Behavior: Behavior normal.      ED Treatments / Results  Labs (all labs ordered are listed, but only abnormal results are displayed) Labs Reviewed  COMPREHENSIVE METABOLIC PANEL  CBC WITH DIFFERENTIAL/PLATELET  URINALYSIS, ROUTINE W REFLEX MICROSCOPIC  TYPE AND SCREEN    EKG None  Radiology No results found.  Procedures Procedures (including critical care time)  Medications Ordered in ED Medications - No data to display   Initial  Impression / Assessment and Plan / ED Course  I have reviewed the triage vital signs and the nursing notes.  Pertinent labs & imaging results that were available during my care of the patient were reviewed by me and considered in my medical decision making (see chart for details).        Pt here for evaluation of right hip pain following a fall five days ago.  He is well perfused on exam and in no acute distress.  Reviewed plain films in Epic - he has a right subcapital femoral neck fracture.  Labs significant for stable hyponatremia.  D/w Dr. Maureen Ralphs with Orthopedics - he will see the patient in consult.  Hospitalist consulted for admission.  Patient and wife updated of findings of studies and recommendation for admission and they are in agreement with treatment plan.    Final Clinical Impressions(s) / ED Diagnoses   Final diagnoses:  Closed fracture of right hip, initial encounter [facility])  Hyponatremia    ED Discharge Orders    None       Quintella Reichert, MD 11/09/18 1705

## 2018-11-09 NOTE — ED Notes (Signed)
Bed: WA13 Expected date:  Expected time:  Means of arrival:  Comments: RES B 

## 2018-11-09 NOTE — Progress Notes (Signed)
Pt. placed on CPAP per order in auto mode setting of >16/<6 XL FFM used, humidifier filled with s/w, pt. tolerated placement well, currently remains on room air with RN made aware prior to leaving floor.

## 2018-11-09 NOTE — ED Notes (Signed)
Hospitalist at bedside 

## 2018-11-09 NOTE — H&P (Addendum)
History and Physical    Joe Glover XAJ:[id number] DOB: 1924/12/27 DOA: 11/09/2018  PCP: [name], MD Patient coming from: [place]  Chief Complaint: Right hip fracture  HPI: Joe Glover is a 85 y.o. male with medical history significant of PAF, hypertension, bradycardia, hyponatremia, insomnia, recurrent syncope, orthostatic hypotension. History provided by wife, secondary to patient's difficulty in communication. Five days ago, patient was walking down the stairs when he slid onto his hip. At the time, he did not have much pain. Pain was treated with Tylenol and ice. As the days progressed, he had increasing assist needs for ambulation and transfers and went to see his PCP this afternoon. Hip x-ray was obtained and was significant for right hip fracture. He was sent to the ED for evaluation/management. No prior history of fracture.  ED Course: Vitals: Afebrile, pulse between 90s and low 100s, respirations of 16, normotensive, on room air Labs: Sodium of 129, potassium of 3.4, calcium of 8.4, AST/ALT of 42/47 respectively Imaging: Chest x-rat with cardiomegaly/pulmonary vascular congestion Medications/Course: None  Review of Systems: Review of Systems  Constitutional: Negative for chills and fever.  Respiratory: Negative for shortness of breath.   Cardiovascular: Negative for chest pain.  Neurological: Negative for dizziness and loss of consciousness.  All other systems reviewed and are negative.   Past Medical History:  Diagnosis Date  . Awareness alteration, transient 11/02/2014  . Cellulitis of right hand 01/27/2017  . Colon cancer ([place])   . Essential hypertension 09/23/2012  . Fall 09/16/2016  . Fall from standing 02/25/2017  . GI bleed   . Hyperlipidemia   . Hypertension   . Insomnia 08/13/2016  . Normocytic anemia 01/27/2017  . On amiodarone therapy 08/06/2015  . Orthostatic hypotension 08/06/2015  . OSA (obstructive sleep apnea)   . PAF (paroxysmal atrial  fibrillation) ([place])   . Parkinson disease ([place])   . Prostate cancer ([place])   . SDH (subdural hematoma) ([place]) 09/16/2016  . Status post placement of implantable loop recorder 07/11/2015  . Stroke ([place])   . 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 Subdural hematoma ([place]) 09/05/2016  . Syncope 11/02/2014  . TIA (transient ischemic attack)   . Vitamin D deficiency     Past Surgical History:  Procedure Laterality Date  . COLECTOMY    . HERNIA REPAIR    . ORCHIECTOMY    . PROSTATECTOMY       reports that he quit smoking about 60 years ago. His smoking use included cigarettes. He has a 40.00 pack-year smoking history. He has never used smokeless tobacco. He reports current alcohol use. He reports that he does not use drugs.  Allergies  Allergen Reactions  . Anacin [Aspirin] Other (See Comments)    GI bleed  . Augmentin [Amoxicillin-Pot Clavulanate] Hives  . Hemocyte Plus [Hematinic Plus Vit-Minerals] Other (See Comments)    Unknown reaction  . Penicillins Hives    Has patient had a PCN reaction causing immediate rash, facial/tongue/throat swelling, SOB or lightheadedness with hypotension: No Has patient had a PCN reaction causing severe rash involving mucus membranes or skin necrosis: Yes Has patient had a PCN reaction that required hospitalization: No Has patient had a PCN reaction occurring within the last 10 years: No If all of the above answers are "NO", then may proceed with Cephalosporin use.    Family History  Problem Relation Age of Onset  . Parkinson's disease Mother   .  Heart attack Father   . Heart attack Brother     Prior to Admission medications   Medication Sig Start Date End Date Taking? Authorizing Provider  acetaminophen (TYLENOL) 500 MG tablet Take 500 mg by mouth every 6 (six) hours as needed for moderate pain or headache.     [provider]  Cholecalciferol (VITAMIN  D) 50 MCG (2000 UT) tablet Take 2,000-3,000 Units by mouth See admin instructions. Take 1 1/2 tablets (3000 units) by mouth every morning and 1 tablet (2000 unit) at night    [provider]  cyanocobalamin (,VITAMIN B-12,) 1000 MCG/ML injection Inject 1,000 mcg into the muscle every 30 (thirty) days.     [provider]  EPINEPHrine 0.3 mg/0.3 mL IJ SOAJ injection Inject 0.3 mg into the muscle daily as needed (allergic reaction).    [provider]  ferrous sulfate 325 (65 FE) MG tablet Take 325 mg by mouth 2 (two) times daily with a meal.     [provider]  ipratropium (ATROVENT) 0.03 % nasal spray Place 1 spray into the nose 2 (two) times daily. 08/20/17   [provider]  loratadine (CLARITIN) 10 MG tablet Take 10 mg by mouth at bedtime.     [provider]  magnesium oxide (MAG-OX) 400 MG tablet Take 800 mg by mouth 2 (two) times daily.     [provider]  Melatonin 5 MG TABS Take 5 mg by mouth at bedtime as needed.    [provider]  omeprazole (PRILOSEC) 20 MG capsule Take 20 mg by mouth daily.  04/26/14   [provider]  OnabotulinumtoxinA (BOTOX IJ) Inject as directed every 3 (three) months. Next injection due in December 2019    [provider]  potassium chloride SA (K-DUR,KLOR-CON) 20 MEQ tablet Take 10 mEq by mouth 2 (two) times daily. 08/04/18   [provider]  pravastatin (PRAVACHOL) 40 MG tablet Take 40 mg by mouth at bedtime.  05/07/14   [provider]  PRESCRIPTION MEDICATION Inhale into the lungs at bedtime. CPAP    [provider]  Probiotic Product (PROBIOTIC DAILY PO) Take 1 tablet by mouth at bedtime.     [provider]  psyllium (METAMUCIL) 58.6 % packet Take 1 packet by mouth 2 (two) times daily.     [provider]  sodium chloride 1 g tablet Take 1 tablet (1 g total) by mouth daily. 06/18/18   Richardo Priest, MD  temazepam (RESTORIL)  7.5 MG capsule Take 1 capsule (7.5 mg total) by mouth at bedtime as needed for sleep. 07/26/18   Quintella Reichert, MD    Physical Exam:  Physical Exam Vitals signs reviewed.  Constitutional:      General: He is not in acute distress.    Appearance: He is well-developed. He is not diaphoretic.  Eyes:     Conjunctiva/sclera: Conjunctivae normal.     Pupils: Pupils are equal, round, and reactive to light.  Neck:     Musculoskeletal: Normal range of motion.  Cardiovascular:     Rate and Rhythm: Normal rate. Rhythm irregular.     Pulses:          Dorsalis pedis pulses are 2+ on the right side and 2+ on the left side.     Heart sounds: Normal heart sounds. No murmur.  Pulmonary:     Effort: Pulmonary effort is normal. No respiratory distress.     Breath sounds: Normal breath sounds. No wheezing or  rales.  Abdominal:     General: Bowel sounds are normal. There is no distension.     Palpations: Abdomen is soft.     Tenderness: There is no abdominal tenderness. There is no guarding or rebound.  Musculoskeletal: Normal range of motion.        General: No tenderness.     Right hip: He exhibits normal range of motion and no tenderness.     Right lower leg: No edema.     Left lower leg: No edema.     Comments: Right leg externally rotated with a leg-length discrepancy.  Lymphadenopathy:     Cervical: No cervical adenopathy.  Skin:    General: Skin is warm and dry.  Neurological:     Mental Status: He is alert and oriented to person, place, and time.      Labs on Admission: I have personally reviewed following labs and imaging studies  CBC: Recent Labs  Lab 11/09/18 1525  WBC 7.6  NEUTROABS 5.8  HGB 13.8  HCT 41.3  MCV 84.5  PLT 379    Basic Metabolic Panel: Recent Labs  Lab 11/09/18 1525  NA 129*  K 3.4*  CL 95*  CO2 22  GLUCOSE 116*  BUN 23  CREATININE 0.97  CALCIUM 8.4*    GFR: CrCl cannot be calculated (Unknown ideal weight.).  Liver Function Tests:  Recent Labs  Lab 11/09/18 1525  AST 42*  ALT 47*  ALKPHOS 71  BILITOT 1.2  PROT 7.6  ALBUMIN 3.4*   No results for input(s): LIPASE, AMYLASE in the last 168 hours. No results for input(s): AMMONIA in the last 168 hours.  Coagulation Profile: No results for input(s): INR, PROTIME in the last 168 hours.  Cardiac Enzymes: No results for input(s): CKTOTAL, CKMB, CKMBINDEX, TROPONINI in the last 168 hours.  BNP (last 3 results) No results for input(s): PROBNP in the last 8760 hours.  HbA1C: No results for input(s): HGBA1C in the last 72 hours.  CBG: No results for input(s): GLUCAP in the last 168 hours.  Lipid Profile: No results for input(s): CHOL, HDL, LDLCALC, TRIG, CHOLHDL, LDLDIRECT in the last 72 hours.  Thyroid Function Tests: No results for input(s): TSH, T4TOTAL, FREET4, T3FREE, THYROIDAB in the last 72 hours.  Anemia Panel: No results for input(s): VITAMINB12, FOLATE, FERRITIN, TIBC, IRON, RETICCTPCT in the last 72 hours.  Urine analysis:    Component Value Date/Time   COLORURINE YELLOW 01/27/2017 0021   APPEARANCEUR CLEAR 01/27/2017 0021   LABSPEC 1.006 01/27/2017 0021   PHURINE 6.0 01/27/2017 0021   GLUCOSEU NEGATIVE 01/27/2017 0021   HGBUR NEGATIVE 01/27/2017 0021   BILIRUBINUR NEGATIVE 01/27/2017 0021   KETONESUR NEGATIVE 01/27/2017 0021   PROTEINUR NEGATIVE 01/27/2017 0021   UROBILINOGEN 0.2 10/10/2014 1146   NITRITE NEGATIVE 01/27/2017 0021   LEUKOCYTESUR NEGATIVE 01/27/2017 0021     Radiological Exams on Admission: Dg Chest 1 View  Result Date: 11/09/2018 CLINICAL DATA:  Preoperative chest radiograph prior to hip surgery. EXAM: CHEST  1 VIEW COMPARISON:  07/26/2018 sec prior studies FINDINGS: Cardiomegaly and pulmonary vascular congestion noted. Mild peribronchial thickening is unchanged. There is no evidence of focal airspace disease, pulmonary edema, suspicious pulmonary nodule/mass, pleural effusion, or pneumothorax. No acute bony abnormalities  are identified. IMPRESSION: Cardiomegaly with pulmonary vascular congestion. Electronically Signed   By: Margarette Canada M.D.   On: 11/09/2018 16:04   Dg Knee Complete 4 Views Right  Result Date: 11/09/2018 CLINICAL DATA:  Recent fall with known right  hip fracture EXAM: RIGHT KNEE - COMPLETE 4+ VIEW COMPARISON:  07/22/2006 FINDINGS: Degenerative changes are noted about the right knee joint. Some chondrocalcinosis is seen. Small joint effusion is noted. No acute fracture is seen. IMPRESSION: Degenerative change with small joint effusion. No acute bony abnormality is noted. Electronically Signed   By: Inez Catalina M.D.   On: 11/09/2018 16:01    EKG: Independently reviewed. Atrial fibrillation. PVCs  Assessment/Plan Active Problems:   OSA (obstructive sleep apnea)   Insomnia   Parkinson disease (HCC)   Hyponatremia   Hypokalemia   Essential hypertension   Orthostatic hypotension   PAF (paroxysmal atrial fibrillation) (HCC)   Closed right hip fracture (HCC)    Right hip fracture No associated syncope. Subacute. Orthopedic surgery planning surgery either 5/5 or 5/6. Perioperative risk score of 2% with <4 METs -Analgesics as needed -Orthopedic surgery recommendations -Will obtain Transthoracic Echocardiogram with evidence of possible heart failure on x-ray -Cardiology consult in AM  Paroxysmal atrial fibrillation Not on anticoagulation secondary to history of SDH and recurrent falls. Not on rate control secondary to orthostatic hypotension. Borderline currently. -Metoprolol IV prn -Telemetry  Hyponatremia Chronic. Stable. -Continue salt tabs  Hypokalemia -Continue kdur -AM BMP  Hyperlipidemia -Continue Pravastatin  Parkinson disease Not on medication. Likely contributing to falls, and more specifically, this fall  Recurrent syncope Per chart review, secondary to autonomic dysfunction in setting of Parkinson disease per cardiology notes. No concern for arrhythmia etiology   Insomnia -Continue temazepam and melatonin qhs prn  Chronic diastolic heart failure Grade 1 diastolic dysfunction with normal EF from 2018 Transthoracic Echocardiogram. Vascular congestion on x-ray. -Transthoracic Echocardiogram as mentioned above  OSA -CPAP qhs  ?Sick sinus syndrome Concern in the past. Has a history of heart monitoring which did not reveal any significant arrhythmia   DVT prophylaxis: Lovenox Code Status: DNR Family Communication: Wife at bedside Disposition Plan: Telemetry Consults called: Orthopedic surgery Admission status: Inpatient   Cordelia Poche, MD [facility] 11/09/2018, 5:11 PM  If 7PM-7AM, please contact night-coverage [email or web address] Password [email or web address]

## 2018-11-09 NOTE — ED Notes (Signed)
Patient given meal tray. Patient ate about 15% of tray.

## 2018-11-09 NOTE — Consult Note (Signed)
Reason for Consult:Right femoral neck fracture Referring Physician: Dr. Harvie Bridge is an 85 y.o. male.  HPI: 85 yo male who fell down approximately one week ago landing on his buttocks. He slipped and had a mechanical fall. He was able to get up and ambulate with a walker but had a significant increase in groin pain the past 2 days necessitating an ER visit today. He had increasing difficulty with weight bearing. He was able to ambulate with a walker prior to his fall. He has no other complaints except for groin pain. He has advanced Parkinson's disease. He came to the ER today and was found to have a displaced right femoral neck fracture. I was consulted for management  Past Medical History:  Diagnosis Date  . Awareness alteration, transient 11/02/2014  . Cellulitis of right hand 01/27/2017  . Colon cancer ([place])   . Essential hypertension 09/23/2012  . Fall 09/16/2016  . Fall from standing 02/25/2017  . GI bleed   . Hyperlipidemia   . Hypertension   . Insomnia 08/13/2016  . Normocytic anemia 01/27/2017  . On amiodarone therapy 08/06/2015  . Orthostatic hypotension 08/06/2015  . OSA (obstructive sleep apnea)   . PAF (paroxysmal atrial fibrillation) ([place])   . Parkinson disease ([place])   . Prostate cancer ([place])   . SDH (subdural hematoma) ([place]) 09/16/2016  . Status post placement of implantable loop recorder 07/11/2015  . Stroke ([place])   . 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 Subdural hematoma ([place]) 09/05/2016  . Syncope 11/02/2014  . TIA (transient ischemic attack)   . Vitamin D deficiency     Past Surgical History:  Procedure Laterality Date  . COLECTOMY    . HERNIA REPAIR    . ORCHIECTOMY    . PROSTATECTOMY      Family History  Problem Relation Age of Onset  . Parkinson's disease Mother   . Heart attack Father   . Heart attack Brother     Social History:  reports that he quit  smoking about 60 years ago. His smoking use included cigarettes. He has a 40.00 pack-year smoking history. He has never used smokeless tobacco. He reports current alcohol use. He reports that he does not use drugs.  Allergies:  Allergies  Allergen Reactions  . Anacin [Aspirin] Other (See Comments)    GI bleed  . Augmentin [Amoxicillin-Pot Clavulanate] Hives  . Hemocyte Plus [Hematinic Plus Vit-Minerals] Other (See Comments)    Unknown reaction  . Penicillins Hives    Has patient had a PCN reaction causing immediate rash, facial/tongue/throat swelling, SOB or lightheadedness with hypotension: No Has patient had a PCN reaction causing severe rash involving mucus membranes or skin necrosis: Yes Has patient had a PCN reaction that required hospitalization: No Has patient had a PCN reaction occurring within the last 10 years: No If all of the above answers are "NO", then may proceed with Cephalosporin use.    Medications: I have reviewed the patient's current medications.  Results for orders placed or performed during the hospital encounter of 11/09/18 (from the past 48 hour(s))  Comprehensive metabolic panel     Status: Abnormal   Collection Time: 11/09/18  3:25 PM  Result Value Ref Range   Sodium 129 (L) 135 - 145 mmol/L   Potassium 3.4 (L) 3.5 - 5.1 mmol/L   Chloride 95 (L) 98 - 111 mmol/L   CO2 22 22 -  32 mmol/L   Glucose, Bld 116 (H) 70 - 99 mg/dL   BUN 23 8 - 23 mg/dL   Creatinine, Ser 0.97 0.61 - 1.24 mg/dL   Calcium 8.4 (L) 8.9 - 10.3 mg/dL   Total Protein 7.6 6.5 - 8.1 g/dL   Albumin 3.4 (L) 3.5 - 5.0 g/dL   AST 42 (H) 15 - 41 U/L   ALT 47 (H) 0 - 44 U/L   Alkaline Phosphatase 71 38 - 126 U/L   Total Bilirubin 1.2 0.3 - 1.2 mg/dL   GFR calc non Af Amer >60 >60 mL/min   GFR calc Af Amer >60 >60 mL/min   Anion gap 12 5 - 15    Comment: Performed at [facility], [place] [address], [place], [place] [address]  CBC with Differential     Status: None    Collection Time: 11/09/18  3:25 PM  Result Value Ref Range   WBC 7.6 4.0 - 10.5 K/uL   RBC 4.89 4.22 - 5.81 MIL/uL   Hemoglobin 13.8 13.0 - 17.0 g/dL   HCT 41.3 39.0 - 52.0 %   MCV 84.5 80.0 - 100.0 fL   MCH 28.2 26.0 - 34.0 pg   MCHC 33.4 30.0 - 36.0 g/dL   RDW 15.0 11.5 - 15.5 %   Platelets 207 150 - 400 K/uL   nRBC 0.0 0.0 - 0.2 %   Neutrophils Relative % 76 %   Neutro Abs 5.8 1.7 - 7.7 K/uL   Lymphocytes Relative 12 %   Lymphs Abs 0.9 0.7 - 4.0 K/uL   Monocytes Relative 11 %   Monocytes Absolute 0.8 0.1 - 1.0 K/uL   Eosinophils Relative 1 %   Eosinophils Absolute 0.0 0.0 - 0.5 K/uL   Basophils Relative 0 %   Basophils Absolute 0.0 0.0 - 0.1 K/uL   Immature Granulocytes 0 %   Abs Immature Granulocytes 0.03 0.00 - 0.07 K/uL    Comment: Performed at [facility], [place] [address], [place], [place] [address]  Type and screen [place]     Status: None   Collection Time: 11/09/18  3:25 PM  Result Value Ref Range   ABO/RH(D) O POS    Antibody Screen NEG    Sample Expiration      11/12/2018 Performed at [facility], [place] [address] [place], [place] [address]   SARS Coronavirus 2 (CEPHEID - Performed in [place] hospital lab), Hosp Order     Status: None   Collection Time: 11/09/18  3:25 PM  Result Value Ref Range   SARS Coronavirus 2 NEGATIVE NEGATIVE    Comment: (NOTE) If result is NEGATIVE SARS-CoV-2 target nucleic acids are NOT DETECTED. The SARS-CoV-2 RNA is generally detectable in upper and lower  respiratory specimens during the acute phase of infection. The lowest  concentration of SARS-CoV-2 viral copies this assay can detect is 250  copies / mL. A negative result does not preclude SARS-CoV-2 infection  and should not be used as the sole basis for treatment or other  patient management decisions.  A negative result may occur with  improper specimen collection / handling, submission of specimen other   than nasopharyngeal swab, presence of viral mutation(s) within the  areas targeted by this assay, and inadequate number of viral copies  (<250 copies / mL). A negative result must be combined with clinical  observations, patient history, and epidemiological information. If result is POSITIVE SARS-CoV-2 target nucleic acids are  DETECTED. The SARS-CoV-2 RNA is generally detectable in upper and lower  respiratory specimens dur ing the acute phase of infection.  Positive  results are indicative of active infection with SARS-CoV-2.  Clinical  correlation with patient history and other diagnostic information is  necessary to determine patient infection status.  Positive results do  not rule out bacterial infection or co-infection with other viruses. If result is PRESUMPTIVE POSTIVE SARS-CoV-2 nucleic acids MAY BE PRESENT.   A presumptive positive result was obtained on the submitted specimen  and confirmed on repeat testing.  While 2019 novel coronavirus  (SARS-CoV-2) nucleic acids may be present in the submitted sample  additional confirmatory testing may be necessary for epidemiological  and / or clinical management purposes  to differentiate between  SARS-CoV-2 and other Sarbecovirus currently known to infect humans.  If clinically indicated additional testing with an alternate test  methodology [id number]) is advised. The SARS-CoV-2 RNA is generally  detectable in upper and lower respiratory sp ecimens during the acute  phase of infection. The expected result is Negative. Fact Sheet for Patients:  StrictlyIdeas.no Fact Sheet for Healthcare Providers: BankingDealers.co.za This test is not yet approved or cleared by the [place] FDA and has been authorized for detection and/or diagnosis of SARS-CoV-2 by FDA under an Emergency Use Authorization (EUA).  This EUA will remain in effect (meaning this test can be used) for the duration of  the COVID-19 declaration under Section 564(b)(1) of the Act, 21 U.S.C. section 360bbb-3(b)(1), unless the authorization is terminated or revoked sooner. Performed at [facility], [place] [address], [place], [place] [address]     Dg Chest 1 View  Result Date: 11/09/2018 CLINICAL DATA:  Preoperative chest radiograph prior to hip surgery. EXAM: CHEST  1 VIEW COMPARISON:  07/26/2018 sec prior studies FINDINGS: Cardiomegaly and pulmonary vascular congestion noted. Mild peribronchial thickening is unchanged. There is no evidence of focal airspace disease, pulmonary edema, suspicious pulmonary nodule/mass, pleural effusion, or pneumothorax. No acute bony abnormalities are identified. IMPRESSION: Cardiomegaly with pulmonary vascular congestion. Electronically Signed   By: Margarette Canada M.D.   On: 11/09/2018 16:04   Dg Knee Complete 4 Views Right  Result Date: 11/09/2018 CLINICAL DATA:  Recent fall with known right hip fracture EXAM: RIGHT KNEE - COMPLETE 4+ VIEW COMPARISON:  07/22/2006 FINDINGS: Degenerative changes are noted about the right knee joint. Some chondrocalcinosis is seen. Small joint effusion is noted. No acute fracture is seen. IMPRESSION: Degenerative change with small joint effusion. No acute bony abnormality is noted. Electronically Signed   By: Inez Catalina M.D.   On: 11/09/2018 16:01    ROS Blood pressure (!) 127/108, pulse (!) 107, temperature 97.6 F (36.4 C), temperature source Oral, resp. rate 17, SpO2 92 %. Physical Exam Physical Examination: General appearance - alert, well appearing, and in no distress Mental status - alert, oriented to person, place, and time Musculoskeletal - He has tenderness over the greater trochanter and pain with any attempted range of motion right hip. He has normal sensation right foot, wiggles toes normally and has trace palpable pulses. The extremity is minimally shortened and slightly externally rotated Extremities - no pedal edema  noted, no ulcers, gangrene or atrophic changes  Radiographs show a partially displaced right femoral neck fracture   Assessment/Plan: Right femoral neck fracture- he has a displaced fracture with significant pain and inability to ambulate. It is partially displaced but I do not feel it is amenable to reduction/fixation Given his Parkinson's disease at  advanced stage, an anterior hip replacement will provide the greatest chance for him to ambulate again and will provide the most stability with a disease that places him at high risk for hip instability. I discussed this with his wife , including options of no surgery,ORIF and posterior hip hemiarthroplasty. She is comfortable with proceeding with the anterior hip replacement. We will plan on doing this Wednesday afternoon  Gaynelle Arabian 11/09/2018, 8:34 PM

## 2018-11-09 NOTE — ED Notes (Signed)
ED TO INPATIENT HANDOFF REPORT  Name/Age/Gender Joe Glover [place] 85 y.o. male  Code Status Code Status History    Date Active Date Inactive Code Status Order ID Comments User Context   05/31/2018 1749 06/02/2018 1816 Full Code [id number]  Tawni Millers, MD Inpatient   01/27/2017 0129 01/28/2017 2253 Full Code [id number]  Opyd, Ilene Qua, MD ED    Advance Directive Documentation     Most Recent Value  Type of Advance Directive  Healthcare Power of Attorney, Living will  Pre-existing out of facility DNR order (yellow form or pink MOST form)  -  "MOST" Form in Place?  -      Home/SNF/Other Home  Chief Complaint Fractured hip  Level of Care/Admitting Diagnosis ED Disposition    ED Disposition Condition [place]: [place] [100102]  Level of Care: Telemetry [5]  Admit to tele based on following criteria: Complex arrhythmia (Bradycardia/Tachycardia)  Covid Evaluation: N/A  Diagnosis: Closed right hip fracture [facility]) [[id number]]  Admitting Physician: Mariel Aloe [id number]  Attending Physician: Mariel Aloe [phone]  Estimated length of stay: past midnight tomorrow  Certification:: I certify this patient will need inpatient services for at least 2 midnights  PT Class (Do Not Modify): Inpatient [101]  PT Acc Code (Do Not Modify): Private [1]       Medical History Past Medical History:  Diagnosis Date  . Awareness alteration, transient 11/02/2014  . Cellulitis of right hand 01/27/2017  . Colon cancer ([place])   . Essential hypertension 09/23/2012  . Fall 09/16/2016  . Fall from standing 02/25/2017  . GI bleed   . Hyperlipidemia   . Hypertension   . Insomnia 08/13/2016  . Normocytic anemia 01/27/2017  . On amiodarone therapy 08/06/2015  . Orthostatic hypotension 08/06/2015  . OSA (obstructive sleep apnea)   . PAF (paroxysmal atrial fibrillation) ([place])   . Parkinson disease ([place])   . Prostate cancer ([place])   . SDH (subdural  hematoma) ([place]) 09/16/2016  . Status post placement of implantable loop recorder 07/11/2015  . Stroke ([place])   . 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 Subdural hematoma ([place]) 09/05/2016  . Syncope 11/02/2014  . TIA (transient ischemic attack)   . Vitamin D deficiency     Allergies Allergies  Allergen Reactions  . Anacin [Aspirin] Other (See Comments)    GI bleed  . Augmentin [Amoxicillin-Pot Clavulanate] Hives  . Hemocyte Plus [Hematinic Plus Vit-Minerals] Other (See Comments)    Unknown reaction  . Penicillins Hives    Has patient had a PCN reaction causing immediate rash, facial/tongue/throat swelling, SOB or lightheadedness with hypotension: No Has patient had a PCN reaction causing severe rash involving mucus membranes or skin necrosis: Yes Has patient had a PCN reaction that required hospitalization: No Has patient had a PCN reaction occurring within the last 10 years: No If all of the above answers are "NO", then may proceed with Cephalosporin use.    IV Location/Drains/Wounds Patient Lines/Drains/Airways Status   Active Line/Drains/Airways    Name:   Placement date:   Placement time:   Site:   Days:   Peripheral IV 11/09/18 Left Antecubital   11/09/18    1514    Antecubital   less than 1          Labs/Imaging Results for orders placed or performed during the hospital encounter of 11/09/18 (from the  past 48 hour(s))  Comprehensive metabolic panel     Status: Abnormal   Collection Time: 11/09/18  3:25 PM  Result Value Ref Range   Sodium 129 (L) 135 - 145 mmol/L   Potassium 3.4 (L) 3.5 - 5.1 mmol/L   Chloride 95 (L) 98 - 111 mmol/L   CO2 22 22 - 32 mmol/L   Glucose, Bld 116 (H) 70 - 99 mg/dL   BUN 23 8 - 23 mg/dL   Creatinine, Ser 0.97 0.61 - 1.24 mg/dL   Calcium 8.4 (L) 8.9 - 10.3 mg/dL   Total Protein 7.6 6.5 - 8.1 g/dL   Albumin 3.4 (L) 3.5 - 5.0 g/dL   AST 42 (H) 15 - 41 U/L    ALT 47 (H) 0 - 44 U/L   Alkaline Phosphatase 71 38 - 126 U/L   Total Bilirubin 1.2 0.3 - 1.2 mg/dL   GFR calc non Af Amer >60 >60 mL/min   GFR calc Af Amer >60 >60 mL/min   Anion gap 12 5 - 15    Comment: Performed at [facility], [place] [address], [place], [place] [address]  CBC with Differential     Status: None   Collection Time: 11/09/18  3:25 PM  Result Value Ref Range   WBC 7.6 4.0 - 10.5 K/uL   RBC 4.89 4.22 - 5.81 MIL/uL   Hemoglobin 13.8 13.0 - 17.0 g/dL   HCT 41.3 39.0 - 52.0 %   MCV 84.5 80.0 - 100.0 fL   MCH 28.2 26.0 - 34.0 pg   MCHC 33.4 30.0 - 36.0 g/dL   RDW 15.0 11.5 - 15.5 %   Platelets 207 150 - 400 K/uL   nRBC 0.0 0.0 - 0.2 %   Neutrophils Relative % 76 %   Neutro Abs 5.8 1.7 - 7.7 K/uL   Lymphocytes Relative 12 %   Lymphs Abs 0.9 0.7 - 4.0 K/uL   Monocytes Relative 11 %   Monocytes Absolute 0.8 0.1 - 1.0 K/uL   Eosinophils Relative 1 %   Eosinophils Absolute 0.0 0.0 - 0.5 K/uL   Basophils Relative 0 %   Basophils Absolute 0.0 0.0 - 0.1 K/uL   Immature Granulocytes 0 %   Abs Immature Granulocytes 0.03 0.00 - 0.07 K/uL    Comment: Performed at [facility], [place] [address], [place], [place] [address]  Type and screen [place]     Status: None (Preliminary result)   Collection Time: 11/09/18  3:25 PM  Result Value Ref Range   ABO/RH(D) O POS    Antibody Screen      NEG Performed at [facility], [place] [address], [place], [place] [address]    Sample Expiration PENDING   SARS Coronavirus 2 (CEPHEID - Performed in [facility] hospital lab), Hosp Order     Status: None   Collection Time: 11/09/18  3:25 PM  Result Value Ref Range   SARS Coronavirus 2 NEGATIVE NEGATIVE    Comment: (NOTE) If result is NEGATIVE SARS-CoV-2 target nucleic acids are NOT DETECTED. The SARS-CoV-2 RNA is generally detectable in upper and lower  respiratory specimens during the acute phase of infection.  The lowest  concentration of SARS-CoV-2 viral copies this assay can detect is 250  copies / mL. A negative result does not preclude SARS-CoV-2 infection  and should not be used as the sole basis for treatment or other  patient management decisions.  A negative result may occur with  improper specimen collection / handling, submission of specimen other  than nasopharyngeal swab, presence of viral mutation(s) within the  areas targeted by this assay, and inadequate number of viral copies  (<250 copies / mL). A negative result must be combined with clinical  observations, patient history, and epidemiological information. If result is POSITIVE SARS-CoV-2 target nucleic acids are DETECTED. The SARS-CoV-2 RNA is generally detectable in upper and lower  respiratory specimens dur ing the acute phase of infection.  Positive  results are indicative of active infection with SARS-CoV-2.  Clinical  correlation with patient history and other diagnostic information is  necessary to determine patient infection status.  Positive results do  not rule out bacterial infection or co-infection with other viruses. If result is PRESUMPTIVE POSTIVE SARS-CoV-2 nucleic acids MAY BE PRESENT.   A presumptive positive result was obtained on the submitted specimen  and confirmed on repeat testing.  While 2019 novel coronavirus  (SARS-CoV-2) nucleic acids may be present in the submitted sample  additional confirmatory testing may be necessary for epidemiological  and / or clinical management purposes  to differentiate between  SARS-CoV-2 and other Sarbecovirus currently known to infect humans.  If clinically indicated additional testing with an alternate test  methodology [phone]) is advised. The SARS-CoV-2 RNA is generally  detectable in upper and lower respiratory sp ecimens during the acute  phase of infection. The expected result is Negative. Fact Sheet for Patients:   StrictlyIdeas.no Fact Sheet for Healthcare Providers: BankingDealers.co.za This test is not yet approved or cleared by the [place] FDA and has been authorized for detection and/or diagnosis of SARS-CoV-2 by FDA under an Emergency Use Authorization (EUA).  This EUA will remain in effect (meaning this test can be used) for the duration of the COVID-19 declaration under Section 564(b)(1) of the Act, 21 U.S.C. section 360bbb-3(b)(1), unless the authorization is terminated or revoked sooner. Performed at [facility], [place] [address], [place], [place] [address]    Dg Chest 1 View  Result Date: 11/09/2018 CLINICAL DATA:  Preoperative chest radiograph prior to hip surgery. EXAM: CHEST  1 VIEW COMPARISON:  07/26/2018 sec prior studies FINDINGS: Cardiomegaly and pulmonary vascular congestion noted. Mild peribronchial thickening is unchanged. There is no evidence of focal airspace disease, pulmonary edema, suspicious pulmonary nodule/mass, pleural effusion, or pneumothorax. No acute bony abnormalities are identified. IMPRESSION: Cardiomegaly with pulmonary vascular congestion. Electronically Signed   By: Margarette Canada M.D.   On: 11/09/2018 16:04   Dg Knee Complete 4 Views Right  Result Date: 11/09/2018 CLINICAL DATA:  Recent fall with known right hip fracture EXAM: RIGHT KNEE - COMPLETE 4+ VIEW COMPARISON:  07/22/2006 FINDINGS: Degenerative changes are noted about the right knee joint. Some chondrocalcinosis is seen. Small joint effusion is noted. No acute fracture is seen. IMPRESSION: Degenerative change with small joint effusion. No acute bony abnormality is noted. Electronically Signed   By: Inez Catalina M.D.   On: 11/09/2018 16:01    Pending Labs Unresulted Labs (From admission, onward)    Start     Ordered   11/09/18 1525  ABO/Rh  Once,   R     11/09/18 1525   11/09/18 1515  Urinalysis, Routine w reflex microscopic  ONCE - STAT,    STAT     11/09/18 1514   Signed and Held  Creatinine, serum  (enoxaparin (LOVENOX)    CrCl >/= 30 ml/min)  Weekly,   R    Comments:  while on enoxaparin therapy  Signed and Held          Vitals/Pain Today's Vitals   11/09/18 1600 11/09/18 1630 11/09/18 1700 11/09/18 1730  BP: 130/86 109/72 118/67 113/80  Pulse: 90 (!) 108 (!) 105 97  Resp: (!) 21 16 20 16   Temp:      TempSrc:      SpO2: 99% 100% 100% 100%    Isolation Precautions No active isolations  Medications Medications - No data to display  Mobility walks with device

## 2018-11-09 NOTE — ED Notes (Signed)
X-ray at bedside

## 2018-11-09 NOTE — ED Notes (Signed)
This RN and tech changed patients brief.

## 2018-11-10 ENCOUNTER — Other Ambulatory Visit (HOSPITAL_COMMUNITY): Payer: Medicare Other

## 2018-11-10 DIAGNOSIS — Z01810 Encounter for preprocedural cardiovascular examination: Secondary | ICD-10-CM

## 2018-11-10 DIAGNOSIS — I482 Chronic atrial fibrillation, unspecified: Secondary | ICD-10-CM

## 2018-11-10 DIAGNOSIS — I5033 Acute on chronic diastolic (congestive) heart failure: Secondary | ICD-10-CM

## 2018-11-10 DIAGNOSIS — S72001A Fracture of unspecified part of neck of right femur, initial encounter for closed fracture: Principal | ICD-10-CM

## 2018-11-10 LAB — BASIC METABOLIC PANEL
Anion gap: 9 (ref 5–15)
BUN: 22 mg/dL (ref 8–23)
CO2: 19 mmol/L — ABNORMAL LOW (ref 22–32)
Calcium: 7.9 mg/dL — ABNORMAL LOW (ref 8.9–10.3)
Chloride: 100 mmol/L (ref 98–111)
Creatinine, Ser: 0.89 mg/dL (ref 0.61–1.24)
GFR calc Af Amer: >60 mL/min (ref >60)
GFR calc non Af Amer: >60 mL/min (ref >60)
Glucose, Bld: 104 mg/dL — ABNORMAL HIGH (ref 70–99)
Potassium: 3.5 mmol/L (ref 3.5–5.1)
Sodium: 128 mmol/L — ABNORMAL LOW (ref 135–145)

## 2018-11-10 LAB — MAGNESIUM: Magnesium: 1.7 mg/dL (ref 1.7–2.4)

## 2018-11-10 MED ORDER — METOPROLOL TARTRATE 5 MG/5ML IV SOLN
5.0000 mg | Freq: Four times a day (QID) | INTRAVENOUS | Status: DC
  Administered 2018-11-10 – 2018-11-11 (×4): 5 mg via INTRAVENOUS
  Filled 2018-11-10 (×4): qty 5

## 2018-11-10 MED ORDER — ORAL CARE MOUTH RINSE
15.0000 mL | Freq: Two times a day (BID) | OROMUCOSAL | Status: DC
  Administered 2018-11-10 – 2018-11-13 (×4): 15 mL via OROMUCOSAL

## 2018-11-10 MED ORDER — MAGNESIUM SULFATE IN D5W 1-5 GM/100ML-% IV SOLN
1.0000 g | Freq: Once | INTRAVENOUS | Status: AC
  Administered 2018-11-10: 1 g via INTRAVENOUS
  Filled 2018-11-10: qty 100

## 2018-11-10 MED ORDER — ENSURE ENLIVE PO LIQD
237.0000 mL | Freq: Two times a day (BID) | ORAL | Status: DC
  Administered 2018-11-10 – 2018-11-14 (×6): 237 mL via ORAL

## 2018-11-10 MED ORDER — CHLORHEXIDINE GLUCONATE 0.12 % MT SOLN
15.0000 mL | Freq: Two times a day (BID) | OROMUCOSAL | Status: DC
  Administered 2018-11-10 – 2018-11-13 (×7): 15 mL via OROMUCOSAL
  Filled 2018-11-10 (×8): qty 15

## 2018-11-10 NOTE — Progress Notes (Addendum)
Pt had an opportunity to talk with wife this afternoon, pt wife appreciative. SRP, RN

## 2018-11-10 NOTE — Progress Notes (Signed)
PROGRESS NOTE    Joe Glover  JME:[id number] DOB: 1924-09-23 DOA: 11/09/2018 PCP: [name], MD   Brief Narrative: Joe Glover is a 85 y.o. male with medical history significant of PAF, hypertension, bradycardia, hyponatremia, insomnia, recurrent syncope, orthostatic hypotension. He presented with a right hip fracture after slipping while walking down the stairs. Plan for surgery.   Assessment & Plan:   Active Problems:   OSA (obstructive sleep apnea)   Insomnia   Parkinson disease (HCC)   Hyponatremia   Hypokalemia   Essential hypertension   Orthostatic hypotension   PAF (paroxysmal atrial fibrillation) (HCC)   Closed right hip fracture (HCC)   Right hip fracture No associated syncope. Subacute. Orthopedic surgery planning surgery either 5/5 or 5/6. Cardiology consulted and patient recommended for surgery, although increased risk from age; in this case, benefits outweigh risks. No further cardiac workup planned. -Analgesics as needed -Orthopedic surgery recommendations: Surgery on 5/6  Paroxysmal atrial fibrillation Not on anticoagulation secondary to history of SDH and recurrent falls. Not on rate control secondary to orthostatic hypotension. Rate with intermittent rapid rates overnight. PVCs as well.  -Metoprolol IV prn -Telemetry  Hyponatremia Chronic. Stable. -Continue salt tabs  Hypokalemia Chronic. Improved this morning. -Continue kdur  Hyperlipidemia -Continue Pravastatin  Parkinson disease Not on medication. Likely contributing to falls, and more specifically, this fall  Recurrent syncope Per chart review, secondary to autonomic dysfunction in setting of Parkinson disease per cardiology notes. No concern for arrhythmia etiology  Insomnia -Continue temazepam and melatonin qhs prn  Chronic diastolic heart failure Grade 1 diastolic dysfunction with normal EF from 2018 Transthoracic Echocardiogram. Vascular congestion on x-ray.  -Transthoracic Echocardiogram as mentioned above  OSA -CPAP qhs  ?Sick sinus syndrome Concern in the past. Has a history of heart monitoring which did not reveal any significant arrhythmia. On telemetry currently.  DVT prophylaxis: Lovenox Code Status:   Code Status: DNR Family Communication: None Disposition Plan: Discharge pending orthopedic surgery management   Consultants:   Orthopedic surgery  Cardiology  Procedures:   None  Antimicrobials:  None    Subjective: No issues overnight. Pain controlled.  Objective: Vitals:   11/09/18 2100 11/09/18 2252 11/10/18 0107 11/10/18 0624  BP: 111/82  136/83 116/72  Pulse: (!) 49  (!) 58 60  Resp: (!) 22  (!) 22   Temp:   99.1 F (37.3 C) 99.2 F (37.3 C)  TempSrc:   Oral Oral  SpO2: 98%  98% 97%  Weight:  70.1 kg    Height:  6' (1.829 m)      Intake/Output Summary (Last 24 hours) at 11/10/2018 1205 Last data filed at 11/10/2018 0600 Gross per 24 hour  Intake 200 ml  Output -  Net 200 ml   Filed Weights   11/09/18 2252  Weight: 70.1 kg    Examination:  General exam: Appears calm and comfortable  Respiratory system: Clear to auscultation. Respiratory effort normal. Cardiovascular system: S1 & S2 heard, irregular rhythm with normal rate. No murmurs, rubs, gallops or clicks. Gastrointestinal system: Abdomen is nondistended, soft and nontender. No organomegaly or masses felt. Normal bowel sounds heard. Central nervous system: Alert and oriented. Extremities: No edema. No calf tenderness Skin: No cyanosis. No rashes Psychiatry: Judgement and insight appear normal. Mood & affect appropriate.     Data Reviewed: I have personally reviewed following labs and imaging studies  CBC: Recent Labs  Lab 11/09/18 1525  WBC 7.6  NEUTROABS 5.8  HGB 13.8  HCT 41.3  MCV  84.5  PLT 322   Basic Metabolic Panel: Recent Labs  Lab 11/09/18 1525 11/10/18 0131  NA 129* 128*  K 3.4* 3.5  CL 95* 100  CO2 22 19*   GLUCOSE 116* 104*  BUN 23 22  CREATININE 0.97 0.89  CALCIUM 8.4* 7.9*  MG  --  1.7   GFR: Estimated Creatinine Clearance: 52.5 mL/min (by C-G formula based on SCr of 0.89 mg/dL). Liver Function Tests: Recent Labs  Lab 11/09/18 1525  AST 42*  ALT 47*  ALKPHOS 71  BILITOT 1.2  PROT 7.6  ALBUMIN 3.4*   No results for input(s): LIPASE, AMYLASE in the last 168 hours. No results for input(s): AMMONIA in the last 168 hours. Coagulation Profile: No results for input(s): INR, PROTIME in the last 168 hours. Cardiac Enzymes: No results for input(s): CKTOTAL, CKMB, CKMBINDEX, TROPONINI in the last 168 hours. BNP (last 3 results) No results for input(s): PROBNP in the last 8760 hours. HbA1C: No results for input(s): HGBA1C in the last 72 hours. CBG: No results for input(s): GLUCAP in the last 168 hours. Lipid Profile: No results for input(s): CHOL, HDL, LDLCALC, TRIG, CHOLHDL, LDLDIRECT in the last 72 hours. Thyroid Function Tests: No results for input(s): TSH, T4TOTAL, FREET4, T3FREE, THYROIDAB in the last 72 hours. Anemia Panel: No results for input(s): VITAMINB12, FOLATE, FERRITIN, TIBC, IRON, RETICCTPCT in the last 72 hours. Sepsis Labs: No results for input(s): PROCALCITON, LATICACIDVEN in the last 168 hours.  Recent Results (from the past 240 hour(s))  SARS Coronavirus 2 (CEPHEID - Performed in [place] hospital lab), Hosp Order     Status: None   Collection Time: 11/09/18  3:25 PM  Result Value Ref Range Status   SARS Coronavirus 2 NEGATIVE NEGATIVE Final    Comment: (NOTE) If result is NEGATIVE SARS-CoV-2 target nucleic acids are NOT DETECTED. The SARS-CoV-2 RNA is generally detectable in upper and lower  respiratory specimens during the acute phase of infection. The lowest  concentration of SARS-CoV-2 viral copies this assay can detect is 250  copies / mL. A negative result does not preclude SARS-CoV-2 infection  and should not be used as the sole basis for  treatment or other  patient management decisions.  A negative result may occur with  improper specimen collection / handling, submission of specimen other  than nasopharyngeal swab, presence of viral mutation(s) within the  areas targeted by this assay, and inadequate number of viral copies  (<250 copies / mL). A negative result must be combined with clinical  observations, patient history, and epidemiological information. If result is POSITIVE SARS-CoV-2 target nucleic acids are DETECTED. The SARS-CoV-2 RNA is generally detectable in upper and lower  respiratory specimens dur ing the acute phase of infection.  Positive  results are indicative of active infection with SARS-CoV-2.  Clinical  correlation with patient history and other diagnostic information is  necessary to determine patient infection status.  Positive results do  not rule out bacterial infection or co-infection with other viruses. If result is PRESUMPTIVE POSTIVE SARS-CoV-2 nucleic acids MAY BE PRESENT.   A presumptive positive result was obtained on the submitted specimen  and confirmed on repeat testing.  While 2019 novel coronavirus  (SARS-CoV-2) nucleic acids may be present in the submitted sample  additional confirmatory testing may be necessary for epidemiological  and / or clinical management purposes  to differentiate between  SARS-CoV-2 and other Sarbecovirus currently known to infect humans.  If clinically indicated additional testing with an alternate test  methodology [phone]) is advised. The SARS-CoV-2 RNA is generally  detectable in upper and lower respiratory sp ecimens during the acute  phase of infection. The expected result is Negative. Fact Sheet for Patients:  StrictlyIdeas.no Fact Sheet for Healthcare Providers: BankingDealers.co.za This test is not yet approved or cleared by the [place] FDA and has been authorized for detection and/or  diagnosis of SARS-CoV-2 by FDA under an Emergency Use Authorization (EUA).  This EUA will remain in effect (meaning this test can be used) for the duration of the COVID-19 declaration under Section 564(b)(1) of the Act, 21 U.S.C. section 360bbb-3(b)(1), unless the authorization is terminated or revoked sooner. Performed at [facility], [place] [address], [place], [place] [address]          Radiology Studies: Dg Chest 1 View  Result Date: 11/09/2018 CLINICAL DATA:  Preoperative chest radiograph prior to hip surgery. EXAM: CHEST  1 VIEW COMPARISON:  07/26/2018 sec prior studies FINDINGS: Cardiomegaly and pulmonary vascular congestion noted. Mild peribronchial thickening is unchanged. There is no evidence of focal airspace disease, pulmonary edema, suspicious pulmonary nodule/mass, pleural effusion, or pneumothorax. No acute bony abnormalities are identified. IMPRESSION: Cardiomegaly with pulmonary vascular congestion. Electronically Signed   By: Margarette Canada M.D.   On: 11/09/2018 16:04   Dg Knee Complete 4 Views Right  Result Date: 11/09/2018 CLINICAL DATA:  Recent fall with known right hip fracture EXAM: RIGHT KNEE - COMPLETE 4+ VIEW COMPARISON:  07/22/2006 FINDINGS: Degenerative changes are noted about the right knee joint. Some chondrocalcinosis is seen. Small joint effusion is noted. No acute fracture is seen. IMPRESSION: Degenerative change with small joint effusion. No acute bony abnormality is noted. Electronically Signed   By: Inez Catalina M.D.   On: 11/09/2018 16:01        Scheduled Meds: . chlorhexidine  15 mL Mouth Rinse BID  . docusate sodium  100 mg Oral BID  . enoxaparin (LOVENOX) injection  40 mg Subcutaneous QHS  . ipratropium  1 spray Nasal BID  . loratadine  10 mg Oral QHS  . magnesium oxide  800 mg Oral BID  . mouth rinse  15 mL Mouth Rinse q12n4p  . metoprolol tartrate  5 mg Intravenous Q6H  . midodrine  2.5 mg Oral BID  . pantoprazole  40 mg  Oral Daily  . potassium chloride SA  10 mEq Oral Daily  . pravastatin  40 mg Oral QHS  . senna  1 tablet Oral BID  . sodium chloride  1 g Oral Daily   Continuous Infusions:   LOS: 1 day     Cordelia Poche, MD [facility] 11/10/2018, 12:05 PM  If 7PM-7AM, please contact night-coverage [email or web address]

## 2018-11-10 NOTE — Progress Notes (Signed)
Pt with no urine output this AM. Bladder scan revealed >469 retention. On call provider notified via text/page. Orders received and urinary catheter placed for acute retention. Pt tolerated well and foley draining well.

## 2018-11-10 NOTE — Progress Notes (Signed)
Called wife Shreyas Piatkowski to given informed consent for surgery( right total knee). Questions addressed, pt spoke with wife and both are agreeable. Pt resting without distress. SRP, RN

## 2018-11-10 NOTE — Consult Note (Addendum)
Cardiology Consultation:   Patient ID: Joe Glover MRN: [id number]; DOB: July 27, 1924  Admit date: 11/09/2018 Date of Consult: 11/10/2018  Primary Care Provider: [name], MD Primary Cardiologist: Shirlee More, MD  Primary Electrophysiologist:  None    Patient Profile:   Joe Glover is a 85 y.o. male with a hx of PAF, hypertension, bradycardia, hyponatremia, insomnia, recurrent syncope secondary to autonomic dysfunction, orthostatic hypotension, Parkinson's disease, colon cancer s/p colectomy, prostate cancer s/p prostatectomy and orchiectomy, OSA on CPAP who is being seen today for preoperative cardiac evaluation at the request of Dr. Lonny Prude.  History of Present Illness:   [name] is followed in our office by Dr. [name].  The patient is known to have a history of recurrent syncope with hypotension in the setting of Parkinson's disease.  He was being treated with conservative measures.  He is followed by Dr. Tamsen Roers for neurology in [place].  He has a history of paroxysmal atrial fibrillation. He is not on anticoagulation secondary to history of SDH, recurrent falls and prior GI bleed in 2014 requiring 16 units of PRBCs.  He has a loop recorder and download from 02/2018 showed brief episodes of atrial fibrillation.  He has been at device [facility] since 10/2017. A cardiac event monitor in 05/2018 showed no episodes of atrial fibrillation and no slow rhythms that would require a pacemaker.  Minimum heart rate was 51 bpm.  His last echocardiogram in 01/2017 showed a normal LV systolic function with EF 55-60% and normal wall motion.  He did have grade 1 diastolic dysfunction, aortic valve sclerosis with trivial AI, trivial MR.  He presented to the ED on 11/08/2018 with increased groin pain and difficulty walking after a mechanical fall about 1 week prior. He was found to have a displaced right femoral neck fracture. Orthopedics was consulted and felt that the fracture was not amenable to  reduction/fixation.  They would like to proceed with an anterior hip replacement, planned for Wednesday.  Chest x-ray showed cardiomegaly with pulmonary vascular congestion. Pt tested negative for COVID 19 yesterday.   Past Medical History:  Diagnosis Date  . Awareness alteration, transient 11/02/2014  . Cellulitis of right hand 01/27/2017  . Colon cancer ([place])   . Essential hypertension 09/23/2012  . Fall 09/16/2016  . Fall from standing 02/25/2017  . GI bleed   . Hyperlipidemia   . Hypertension   . Insomnia 08/13/2016  . Normocytic anemia 01/27/2017  . On amiodarone therapy 08/06/2015  . Orthostatic hypotension 08/06/2015  . OSA (obstructive sleep apnea)   . PAF (paroxysmal atrial fibrillation) ([place])   . Parkinson disease ([place])   . Prostate cancer ([place])   . SDH (subdural hematoma) ([place]) 09/16/2016  . Status post placement of implantable loop recorder 07/11/2015  . Stroke ([place])   . 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 Subdural hematoma ([place]) 09/05/2016  . Syncope 11/02/2014  . TIA (transient ischemic attack)   . Vitamin D deficiency     Past Surgical History:  Procedure Laterality Date  . COLECTOMY    . HERNIA REPAIR    . ORCHIECTOMY    . PROSTATECTOMY       Home Medications:  Prior to Admission medications   Medication Sig Start Date End Date Taking? Authorizing Provider  acetaminophen (TYLENOL) 500 MG tablet Take 500 mg by mouth every 6 (six) hours as needed for moderate pain or headache.  Yes [provider]  Cholecalciferol (VITAMIN D3 PO) Take 5,000 mg by mouth daily.   Yes [provider]  cyanocobalamin (,VITAMIN B-12,) 1000 MCG/ML injection Inject 1,000 mcg into the muscle every 30 (thirty) days.    Yes [provider]  ferrous sulfate 325 (65 FE) MG tablet Take 325 mg by mouth 2 (two) times daily with a meal.    Yes [provider]  ipratropium  (ATROVENT) 0.03 % nasal spray Place 1 spray into the nose 2 (two) times daily. 08/20/17  Yes [provider]  loratadine (CLARITIN) 10 MG tablet Take 10 mg by mouth at bedtime.    Yes [provider]  magnesium oxide (MAG-OX) 400 MG tablet Take 800 mg by mouth 2 (two) times daily.    Yes [provider]  Melatonin 5 MG TABS Take 5 mg by mouth at bedtime as needed.   Yes [provider]  omeprazole (PRILOSEC) 20 MG capsule Take 20 mg by mouth daily.  04/26/14  Yes [provider]  OnabotulinumtoxinA (BOTOX IJ) Inject as directed every 3 (three) months. Next injection due in December 2019   Yes [provider]  potassium chloride SA (K-DUR,KLOR-CON) 20 MEQ tablet Take 10 mEq by mouth daily.  08/04/18  Yes [provider]  pravastatin (PRAVACHOL) 40 MG tablet Take 40 mg by mouth at bedtime.  05/07/14  Yes [provider]  [place] into the lungs at bedtime. CPAP   Yes [provider]  Probiotic Product (PROBIOTIC DAILY PO) Take 1 tablet by mouth at bedtime.    Yes [provider]  psyllium (METAMUCIL) 58.6 % packet Take 1 packet by mouth 2 (two) times daily.    Yes [provider]  sodium chloride 1 g tablet Take 1 tablet (1 g total) by mouth daily. 06/18/18  Yes Richardo Priest, MD  temazepam (RESTORIL) 7.5 MG capsule Take 1 capsule (7.5 mg total) by mouth at bedtime as needed for sleep. 07/26/18  Yes Quintella Reichert, MD  EPINEPHrine 0.3 mg/0.3 mL IJ SOAJ injection Inject 0.3 mg into the muscle daily as needed (allergic reaction).    [provider]  midodrine (PROAMATINE) 2.5 MG tablet Take 2.5 mg by mouth 2 (two) times a day. 10/07/18   [provider]    Inpatient Medications: Scheduled Meds: . chlorhexidine  15 mL Mouth Rinse BID  . docusate sodium  100 mg Oral BID  . enoxaparin (LOVENOX) injection  40 mg Subcutaneous QHS  . ipratropium  1 spray Nasal BID  .  loratadine  10 mg Oral QHS  . magnesium oxide  800 mg Oral BID  . mouth rinse  15 mL Mouth Rinse q12n4p  . midodrine  2.5 mg Oral BID  . pantoprazole  40 mg Oral Daily  . potassium chloride SA  10 mEq Oral Daily  . pravastatin  40 mg Oral QHS  . senna  1 tablet Oral BID  . sodium chloride  1 g Oral Daily   Continuous Infusions:  PRN Meds: HYDROcodone-acetaminophen, Melatonin, metoprolol tartrate, morphine injection, temazepam  Allergies:    Allergies  Allergen Reactions  . Anacin [Aspirin] Other (See Comments)    GI bleed  . Augmentin [Amoxicillin-Pot Clavulanate] Hives  . Hemocyte Plus [Hematinic Plus Vit-Minerals] Other (See Comments)    Unknown reaction  . Penicillins Hives    Has patient had a PCN reaction causing immediate rash, facial/tongue/throat swelling, SOB or lightheadedness with hypotension: No Has patient had a  PCN reaction causing severe rash involving mucus membranes or skin necrosis: Yes Has patient had a PCN reaction that required hospitalization: No Has patient had a PCN reaction occurring within the last 10 years: No If all of the above answers are "NO", then may proceed with Cephalosporin use.    Social History:   Social History   Socioeconomic History  . Marital status: Married    Spouse name: Not on file  . Number of children: 2  . Years of education: Not on file  . Highest education level: Not on file  Occupational History  . Occupation: retired  Scientific laboratory technician  . Financial resource strain: Not on file  . Food insecurity:    Worry: Not on file    Inability: Not on file  . Transportation needs:    Medical: Not on file    Non-medical: Not on file  Tobacco Use  . Smoking status: Former Smoker    Packs/day: 2.00    Years: 20.00    Pack years: 40.00    Types: Cigarettes    Last attempt to quit: 07/08/1958    Years since quitting: 60.3  . Smokeless tobacco: Never Used  Substance and Sexual Activity  . Alcohol use: Yes    Alcohol/week: 0.0  standard drinks    Comment: occasional - 1-2 drinks a month  . Drug use: No  . Sexual activity: Not on file  Lifestyle  . Physical activity:    Days per week: Not on file    Minutes per session: Not on file  . Stress: Not on file  Relationships  . Social connections:    Talks on phone: Not on file    Gets together: Not on file    Attends religious service: Not on file    Active member of club or organization: Not on file    Attends meetings of clubs or organizations: Not on file    Relationship status: Not on file  . Intimate partner violence:    Fear of current or ex partner: Not on file    Emotionally abused: Not on file    Physically abused: Not on file    Forced sexual activity: Not on file  Other Topics Concern  . Not on file  Social History Narrative  . Not on file    Family History:    Family History  Problem Relation Age of Onset  . Parkinson's disease Mother   . Heart attack Father   . Heart attack Brother      ROS:  Please see the history of present illness.   All other ROS reviewed and negative.     Physical Exam/Data:   Vitals:   11/09/18 2100 11/09/18 2252 11/10/18 0107 11/10/18 0624  BP: 111/82  136/83 116/72  Pulse: (!) 49  (!) 58 60  Resp: (!) 22  (!) 22   Temp:   99.1 F (37.3 C) 99.2 F (37.3 C)  TempSrc:   Oral Oral  SpO2: 98%  98% 97%  Weight:  70.1 kg    Height:  6' (1.829 m)      Intake/Output Summary (Last 24 hours) at 11/10/2018 0748 Last data filed at 11/10/2018 0600 Gross per 24 hour  Intake 200 ml  Output -  Net 200 ml   Last 3 Weights 11/09/2018 09/16/2018 08/18/2018  Weight (lbs) 154 lb 8.7 oz 158 lb 156 lb 12.8 oz  Weight (kg) 70.1 kg 71.668 kg 71.124 kg     Body mass index  is 20.96 kg/m.   Physical Exam  Patient on CPAP, not in acute distress No JVDs Irregular rate and rhythm, no murmur Bilateral lungs clear to auscultation Warm extremities with no swelling.  EKG:  The EKG was personally reviewed and demonstrates:   Atrial fibrillation with rapid ventricular response with frequent premature ventricular or aberrantly conducted complexes, Left anterior fascicular block Telemetry: Atrial fibrillation with ventricular rate 100 to 110 bpm, PVCs.  Relevant CV Studies:  Echocardiogram pending  Echocardiogram 01/27/2017 Study Conclusions - Left ventricle: The cavity size was normal. Wall thickness was   increased in a pattern of mild LVH. There was moderate focal   basal hypertrophy of the septum. Systolic function was normal.   The estimated ejection fraction was in the range of 55% to 60%.   Wall motion was normal; there were no regional wall motion   abnormalities. Doppler parameters are consistent with abnormal   left ventricular relaxation (grade 1 diastolic dysfunction). The   E/e&' ratio is between 8-15, suggesting indeterminate LV filling   pressure. - Aortic valve: Trileaflet. Sclerosis without stenosis. There was   trivial regurgitation. - Mitral valve: Calcified annulus. Mildly thickened leaflets .   There was trivial regurgitation. - Left atrium: The atrium was normal in size. - Tricuspid valve: There was trivial regurgitation. - Pulmonary arteries: PA peak pressure: 20 mm Hg (S). - Inferior vena cava: The vessel was normal in size. The   respirophasic diameter changes were in the normal range (>= 50%),   consistent with normal central venous pressure.  Impressions: - LVEF 55-60%, mild LVH with moderate focal septal hypertrophy,   normal wall motion, grade 1 DD, indetermiante LV filling   pressure, aortic valve sclerosis with trivial AI, trivial MR,   normal LA size, trivial TR, RVSP 20 mmHg, normal IVC.  Laboratory Data:  Chemistry Recent Labs  Lab 11/09/18 1525 11/10/18 0131  NA 129* 128*  K 3.4* 3.5  CL 95* 100  CO2 22 19*  GLUCOSE 116* 104*  BUN 23 22  CREATININE 0.97 0.89  CALCIUM 8.4* 7.9*  GFRNONAA >60 >60  GFRAA >60 >60  ANIONGAP 12 9    Recent Labs  Lab  11/09/18 1525  PROT 7.6  ALBUMIN 3.4*  AST 42*  ALT 47*  ALKPHOS 71  BILITOT 1.2   Hematology Recent Labs  Lab 11/09/18 1525  WBC 7.6  RBC 4.89  HGB 13.8  HCT 41.3  MCV 84.5  MCH 28.2  MCHC 33.4  RDW 15.0  PLT 207   Cardiac EnzymesNo results for input(s): TROPONINI in the last 168 hours. No results for input(s): TROPIPOC in the last 168 hours.  BNPNo results for input(s): BNP, PROBNP in the last 168 hours.  DDimer No results for input(s): DDIMER in the last 168 hours.  Radiology/Studies:  Dg Chest 1 View  Result Date: 11/09/2018 CLINICAL DATA:  Preoperative chest radiograph prior to hip surgery. EXAM: CHEST  1 VIEW COMPARISON:  07/26/2018 sec prior studies FINDINGS: Cardiomegaly and pulmonary vascular congestion noted. Mild peribronchial thickening is unchanged. There is no evidence of focal airspace disease, pulmonary edema, suspicious pulmonary nodule/mass, pleural effusion, or pneumothorax. No acute bony abnormalities are identified. IMPRESSION: Cardiomegaly with pulmonary vascular congestion. Electronically Signed   By: Margarette Canada M.D.   On: 11/09/2018 16:04   Dg Knee Complete 4 Views Right  Result Date: 11/09/2018 CLINICAL DATA:  Recent fall with known right hip fracture EXAM: RIGHT KNEE - COMPLETE 4+ VIEW COMPARISON:  07/22/2006 FINDINGS: Degenerative  changes are noted about the right knee joint. Some chondrocalcinosis is seen. Small joint effusion is noted. No acute fracture is seen. IMPRESSION: Degenerative change with small joint effusion. No acute bony abnormality is noted. Electronically Signed   By: Inez Catalina M.D.   On: 11/09/2018 16:01    Assessment and Plan:   1. Pre-operative evaluation -Pt with advanced Parkinson's disease and frequent syncope presented with a mechanical fall causing hip fracture.  -No documented prior history of CAD, LV dysfunction, diabetes or renal failure. He does have hx of PAF on no rate control medicaitons due to bradycardia in the  past and hypotension that has led to syncope (likey related to autonomic dysfunction with Parkinson's).   -According to the RCRI risk calculator this pt would be at low risk for major cardiac event peri-operatively, however, due to his advanced age and advanced Parkinson's he would have some increased risk. He should have close hemodynamic and cardiac monitoring.  -Without this surgery the patient would likely lose most of his mobility and thus his quality of life. It is OK from a cardiac standpoint to proceed with surgery and cardiology will follow for rhythm management and other needs.  No further cardiac work-up is required prior to the surgery.   2. Atrial fibrillation -The patient has not been on rate or rhythm control recently.  He has had issues with bradycardia in the past so he is not on a beta-blocker. A cardiac event monitor in 05/2018 showed no episodes of atrial fibrillation and no slow rhythms that would require a pacemaker.  Minimum heart rate was 51 bpm. -Currently he is in atrial fibrillation with rates borderline controlled around 100. -I would start him on metoprolol 5 mg IV every 6 hours as needed as his blood pressure is low and he is on Midodrine, his tachycardia might be related to pain in his hip. - He is not on anticoagulation secondary to history of SDH, recurrent falls and prior GI bleed in 2014 requiring 16 units of PRBCs.   -If the patient should require rate/rhythm control, additional small dose of beta-blocker.  Amiodarone could be considered if needed.   3. Hypotension d/t autonomic dysfunction in setting of Parkinson's disease -Patient is on midodrine 2.5 mg twice daily. -He is followed outpatient by neurology for his Parkinson's. - He will need to be monitored closely for alterations in blood pressure. - Continue to provide safety precautions to prevent falls.  4. Possible diastolic dysfunction -His last echocardiogram in 01/2017 showed a normal LV systolic function  with EF 55-60% and normal wall motion.  He did have grade 1 diastolic dysfunction, aortic valve sclerosis with trivial AI, trivial MR. - Chest x-ray showed cardiomegaly with pulmonary vascular congestion.  This may be in the setting of his acute trauma.  However he does not appear fluid overloaded on physical exam, I anticipate that he will received IV fluids during surgery, he has normal kidney function and should respond to Lasix postop if needed well. -There is no need for an echocardiogram prior or post surgery.  5. Cardiac ectopy -Frequent PVCs noted with baseline atrial fibrillation. -Patient with mild hypokalemia and low end magnesium.  Both electrolytes being replaced per primary team. -Continue to monitor and follow electrolytes.  6. Obstructive sleep apnea -Continue CPAP  7. Parkinson's disease -Management per primary team and neurology if needed.   For questions or updates, please contact [place] Please consult [email or web address] for contact info under   Signed, Ena Dawley, MD  11/10/2018 7:48 AM

## 2018-11-10 NOTE — Progress Notes (Signed)
Initial Nutrition Assessment  INTERVENTION:   Provide Ensure Enlive po BID, each supplement provides 350 kcal and 20 grams of protein  NUTRITION DIAGNOSIS:   Increased nutrient needs related to (hip fracture) as evidenced by estimated needs.  GOAL:   Patient will meet greater than or equal to 90% of their needs  MONITOR:   PO intake, Supplement acceptance, Labs, Weight trends, I & O's  REASON FOR ASSESSMENT:   Consult Hip fracture protocol  ASSESSMENT:   85 y.o. male with medical history significant of PAF, hypertension, bradycardia, hyponatremia, insomnia, recurrent syncope, orthostatic hypotension. He presented with a right hip fracture after slipping while walking down the stairs. Plan for surgery.  **RD working remotely**  Patient is currently NPO for pending surgery, scheduled for 5/6. Pt is on a diet today, no PO intakes documented yet. RD will order Ensure supplements for today and once diet is advanced following surgery as pt will have increased needs for healing purposes.  Per weight records, weights have been stable.  Medications: MAG-OX tablet BID, K-DUR tablet daily, IV Mg sulfate Labs reviewed: Low Na   NUTRITION - FOCUSED PHYSICAL EXAM:  Unable to perform per department requirements to work remotely.  Diet Order:   Diet Order            Diet NPO time specified  Diet effective midnight        Diet Heart Room service appropriate? Yes with Assist; Fluid consistency: Nectar Thick  Diet effective now              EDUCATION NEEDS:   No education needs have been identified at this time  Skin:  Skin Assessment: Reviewed RN Assessment  Last BM:  PTA  Height:   Ht Readings from Last 1 Encounters:  11/09/18 6' (1.829 m)    Weight:   Wt Readings from Last 1 Encounters:  11/09/18 70.1 kg    Ideal Body Weight:  80.9 kg  BMI:  Body mass index is 20.96 kg/m.  Estimated Nutritional Needs:   Kcal:  1650-1850  Protein:  75-85g  Fluid:   1.6L/day  Clayton Bibles, MS, RD, LDN [facility] Diet[facility] Pager: [phone] After Hours Pager: [phone]

## 2018-11-10 NOTE — Progress Notes (Signed)
Called spouse (Mrs. Shetley) to update on surgery time for tomorrow. SRP, RN

## 2018-11-10 NOTE — H&P (View-Only) (Signed)
Subjective: Pt resting in bed with CPAP on. Reports hip pain   Objective: Vital signs in last 24 hours: Temp:  [97.6 F (36.4 C)-99.2 F (37.3 C)] 99.2 F (37.3 C) (05/05 0624) Pulse Rate:  [49-108] 60 (05/05 0624) Resp:  [16-22] 22 (05/05 0107) BP: (109-136)/(65-108) 116/72 (05/05 0624) SpO2:  [92 %-100 %] 97 % (05/05 0624) Weight:  [70.1 kg] 70.1 kg (05/04 2252)  Intake/Output from previous day: 05/04 0701 - 05/05 0700 In: 200 [P.O.:100; IV Piggyback:100] Out: -  Intake/Output this shift: No intake/output data recorded.  Recent Labs    11/09/18 1525  HGB 13.8   Recent Labs    11/09/18 1525  WBC 7.6  RBC 4.89  HCT 41.3  PLT 207   Recent Labs    11/09/18 1525 11/10/18 0131  NA 129* 128*  K 3.4* 3.5  CL 95* 100  CO2 22 19*  BUN 23 22  CREATININE 0.97 0.89  GLUCOSE 116* 104*  CALCIUM 8.4* 7.9*   No results for input(s): LABPT, INR in the last 72 hours.  Sensation intact distally Dorsiflexion/Plantar flexion intact    Assessment/Plan: Right femoral neck fracture- Plan Right THA tomorrow afternoon pending cardiac clearance     Gaynelle Arabian 11/10/2018, 7:48 AM

## 2018-11-10 NOTE — Progress Notes (Signed)
Subjective: Pt resting in bed with CPAP on. Reports hip pain   Objective: Vital signs in last 24 hours: Temp:  [97.6 F (36.4 C)-99.2 F (37.3 C)] 99.2 F (37.3 C) (05/05 0624) Pulse Rate:  [49-108] 60 (05/05 0624) Resp:  [16-22] 22 (05/05 0107) BP: (109-136)/(65-108) 116/72 (05/05 0624) SpO2:  [92 %-100 %] 97 % (05/05 0624) Weight:  [70.1 kg] 70.1 kg (05/04 2252)  Intake/Output from previous day: 05/04 0701 - 05/05 0700 In: 200 [P.O.:100; IV Piggyback:100] Out: -  Intake/Output this shift: No intake/output data recorded.  Recent Labs    11/09/18 1525  HGB 13.8   Recent Labs    11/09/18 1525  WBC 7.6  RBC 4.89  HCT 41.3  PLT 207   Recent Labs    11/09/18 1525 11/10/18 0131  NA 129* 128*  K 3.4* 3.5  CL 95* 100  CO2 22 19*  BUN 23 22  CREATININE 0.97 0.89  GLUCOSE 116* 104*  CALCIUM 8.4* 7.9*   No results for input(s): LABPT, INR in the last 72 hours.  Sensation intact distally Dorsiflexion/Plantar flexion intact    Assessment/Plan: Right femoral neck fracture- Plan Right THA tomorrow afternoon pending cardiac clearance     Gaynelle Arabian 11/10/2018, 7:48 AM

## 2018-11-11 ENCOUNTER — Inpatient Hospital Stay (HOSPITAL_COMMUNITY): Payer: Medicare Other

## 2018-11-11 ENCOUNTER — Inpatient Hospital Stay (HOSPITAL_COMMUNITY): Payer: Medicare Other | Admitting: Anesthesiology

## 2018-11-11 ENCOUNTER — Encounter (HOSPITAL_COMMUNITY)
Admission: EM | Disposition: A | Discharge status: Home Health | Payer: Self-pay | Source: Home / Self Care | Attending: Internal Medicine

## 2018-11-11 HISTORY — PX: TOTAL HIP ARTHROPLASTY: SHX124

## 2018-11-11 LAB — CBC
HCT: 37.8 % — ABNORMAL LOW (ref 39.0–52.0)
Hemoglobin: 12.2 g/dL — ABNORMAL LOW (ref 13.0–17.0)
MCH: 27.7 pg (ref 26.0–34.0)
MCHC: 32.3 g/dL (ref 30.0–36.0)
MCV: 85.9 fL (ref 80.0–100.0)
Platelets: 204 10*3/uL (ref 150–400)
RBC: 4.4 MIL/uL (ref 4.22–5.81)
RDW: 14.8 % (ref 11.5–15.5)
WBC: 5.8 10*3/uL (ref 4.0–10.5)
nRBC: 0 % (ref 0.0–0.2)

## 2018-11-11 LAB — GLUCOSE, CAPILLARY: Glucose-Capillary: 98 mg/dL (ref 70–99)

## 2018-11-11 LAB — BASIC METABOLIC PANEL
Anion gap: 10 (ref 5–15)
BUN: 21 mg/dL (ref 8–23)
CO2: 21 mmol/L — ABNORMAL LOW (ref 22–32)
Calcium: 7.9 mg/dL — ABNORMAL LOW (ref 8.9–10.3)
Chloride: 102 mmol/L (ref 98–111)
Creatinine, Ser: 1 mg/dL (ref 0.61–1.24)
GFR calc Af Amer: >60 mL/min (ref >60)
GFR calc non Af Amer: >60 mL/min (ref >60)
Glucose, Bld: 89 mg/dL (ref 70–99)
Potassium: 4.4 mmol/L (ref 3.5–5.1)
Sodium: 133 mmol/L — ABNORMAL LOW (ref 135–145)

## 2018-11-11 SURGERY — ARTHROPLASTY, HIP, TOTAL, ANTERIOR APPROACH
Anesthesia: General | Site: Hip | Laterality: Right

## 2018-11-11 MED ORDER — STERILE WATER FOR IRRIGATION IR SOLN
Status: DC | PRN
  Administered 2018-11-11: 2000 mL

## 2018-11-11 MED ORDER — HYDROCODONE-ACETAMINOPHEN 5-325 MG PO TABS
1.0000 | ORAL_TABLET | ORAL | Status: DC | PRN
  Filled 2018-11-11: qty 1

## 2018-11-11 MED ORDER — ONDANSETRON HCL 4 MG/2ML IJ SOLN: 4.0000 mg | Freq: Four times a day (QID) | INTRAMUSCULAR | Status: DC | PRN

## 2018-11-11 MED ORDER — TEMAZEPAM 7.5 MG PO CAPS
7.5000 mg | ORAL_CAPSULE | Freq: Every evening | ORAL | Status: DC | PRN
  Administered 2018-11-12 (×2): 7.5 mg via ORAL
  Filled 2018-11-11 (×2): qty 1

## 2018-11-11 MED ORDER — DEXAMETHASONE SODIUM PHOSPHATE 10 MG/ML IJ SOLN
INTRAMUSCULAR | Status: AC
  Filled 2018-11-11: qty 1

## 2018-11-11 MED ORDER — SODIUM CHLORIDE 0.9 % IV SOLN
INTRAVENOUS | Status: DC | PRN
  Administered 2018-11-11: 14:00:00 50 ug/min via INTRAVENOUS

## 2018-11-11 MED ORDER — FLEET ENEMA 7-19 GM/118ML RE ENEM: 1.0000 | ENEMA | Freq: Once | RECTAL | Status: DC | PRN

## 2018-11-11 MED ORDER — MEPERIDINE HCL 50 MG/ML IJ SOLN: 6.2500 mg | INTRAMUSCULAR | Status: DC | PRN

## 2018-11-11 MED ORDER — SUGAMMADEX SODIUM 200 MG/2ML IV SOLN
INTRAVENOUS | Status: AC
  Filled 2018-11-11: qty 2

## 2018-11-11 MED ORDER — ENOXAPARIN SODIUM 40 MG/0.4ML ~~LOC~~ SOLN
40.0000 mg | SUBCUTANEOUS | Status: DC
  Administered 2018-11-12 – 2018-11-14 (×3): 40 mg via SUBCUTANEOUS
  Filled 2018-11-11 (×3): qty 0.4

## 2018-11-11 MED ORDER — POLYETHYLENE GLYCOL 3350 17 G PO PACK: 17.0000 g | PACK | Freq: Every day | ORAL | Status: DC | PRN

## 2018-11-11 MED ORDER — PHENYLEPHRINE 40 MCG/ML (10ML) SYRINGE FOR IV PUSH (FOR BLOOD PRESSURE SUPPORT)
PREFILLED_SYRINGE | INTRAVENOUS | Status: DC | PRN
  Administered 2018-11-11: 80 ug via INTRAVENOUS
  Administered 2018-11-11: 40 ug via INTRAVENOUS

## 2018-11-11 MED ORDER — CEFAZOLIN SODIUM-DEXTROSE 2-4 GM/100ML-% IV SOLN
2.0000 g | Freq: Four times a day (QID) | INTRAVENOUS | Status: AC
  Administered 2018-11-11 – 2018-11-12 (×2): 2 g via INTRAVENOUS
  Filled 2018-11-11 (×3): qty 100

## 2018-11-11 MED ORDER — ROCURONIUM BROMIDE 10 MG/ML (PF) SYRINGE
PREFILLED_SYRINGE | INTRAVENOUS | Status: AC
  Filled 2018-11-11: qty 10

## 2018-11-11 MED ORDER — BUPIVACAINE-EPINEPHRINE (PF) 0.25% -1:200000 IJ SOLN
INTRAMUSCULAR | Status: AC
  Filled 2018-11-11: qty 30

## 2018-11-11 MED ORDER — PHENOL 1.4 % MT LIQD: 1.0000 | OROMUCOSAL | Status: DC | PRN

## 2018-11-11 MED ORDER — SUCCINYLCHOLINE CHLORIDE 200 MG/10ML IV SOSY
PREFILLED_SYRINGE | INTRAVENOUS | Status: AC
  Filled 2018-11-11: qty 10

## 2018-11-11 MED ORDER — LIDOCAINE 2% (20 MG/ML) 5 ML SYRINGE
INTRAMUSCULAR | Status: DC | PRN
  Administered 2018-11-11: 80 mg via INTRAVENOUS

## 2018-11-11 MED ORDER — FENTANYL CITRATE (PF) 100 MCG/2ML IJ SOLN
INTRAMUSCULAR | Status: AC
  Filled 2018-11-11: qty 2

## 2018-11-11 MED ORDER — ROCURONIUM BROMIDE 10 MG/ML (PF) SYRINGE
PREFILLED_SYRINGE | INTRAVENOUS | Status: DC | PRN
  Administered 2018-11-11: 40 mg via INTRAVENOUS

## 2018-11-11 MED ORDER — DEXAMETHASONE SODIUM PHOSPHATE 10 MG/ML IJ SOLN
INTRAMUSCULAR | Status: DC | PRN
  Administered 2018-11-11: 8 mg via INTRAVENOUS

## 2018-11-11 MED ORDER — METOPROLOL TARTRATE 25 MG PO TABS
25.0000 mg | ORAL_TABLET | Freq: Two times a day (BID) | ORAL | Status: DC
  Administered 2018-11-11: 25 mg via ORAL

## 2018-11-11 MED ORDER — METOPROLOL TARTRATE 25 MG PO TABS
12.5000 mg | ORAL_TABLET | Freq: Two times a day (BID) | ORAL | Status: DC
  Administered 2018-11-11 – 2018-11-14 (×6): 12.5 mg via ORAL
  Filled 2018-11-11 (×7): qty 1

## 2018-11-11 MED ORDER — DEXAMETHASONE SODIUM PHOSPHATE 10 MG/ML IJ SOLN
10.0000 mg | Freq: Once | INTRAMUSCULAR | Status: AC
  Administered 2018-11-12: 10 mg via INTRAVENOUS
  Filled 2018-11-11: qty 1

## 2018-11-11 MED ORDER — ONDANSETRON HCL 4 MG/2ML IJ SOLN
INTRAMUSCULAR | Status: DC | PRN
  Administered 2018-11-11: 4 mg via INTRAVENOUS

## 2018-11-11 MED ORDER — ONDANSETRON HCL 4 MG/2ML IJ SOLN
INTRAMUSCULAR | Status: AC
  Filled 2018-11-11: qty 2

## 2018-11-11 MED ORDER — BUPIVACAINE-EPINEPHRINE (PF) 0.25% -1:200000 IJ SOLN
INTRAMUSCULAR | Status: DC | PRN
  Administered 2018-11-11: 30 mL

## 2018-11-11 MED ORDER — PROPOFOL 10 MG/ML IV BOLUS
INTRAVENOUS | Status: AC
  Filled 2018-11-11: qty 20

## 2018-11-11 MED ORDER — SUGAMMADEX SODIUM 200 MG/2ML IV SOLN
INTRAVENOUS | Status: DC | PRN
  Administered 2018-11-11: 200 mg via INTRAVENOUS

## 2018-11-11 MED ORDER — LACTATED RINGERS IV SOLN
INTRAVENOUS | Status: DC
  Administered 2018-11-11 (×2): via INTRAVENOUS

## 2018-11-11 MED ORDER — METOPROLOL TARTRATE 5 MG/5ML IV SOLN: 5.0000 mg | INTRAVENOUS | Status: DC | PRN

## 2018-11-11 MED ORDER — MENTHOL 3 MG MT LOZG
1.0000 | LOZENGE | OROMUCOSAL | Status: DC | PRN
  Filled 2018-11-11: qty 9

## 2018-11-11 MED ORDER — LIDOCAINE 2% (20 MG/ML) 5 ML SYRINGE
INTRAMUSCULAR | Status: AC
  Filled 2018-11-11: qty 5

## 2018-11-11 MED ORDER — METOCLOPRAMIDE HCL 5 MG PO TABS: 5.0000 mg | ORAL_TABLET | Freq: Three times a day (TID) | ORAL | Status: DC | PRN

## 2018-11-11 MED ORDER — ONDANSETRON HCL 4 MG PO TABS: 4.0000 mg | ORAL_TABLET | Freq: Four times a day (QID) | ORAL | Status: DC | PRN

## 2018-11-11 MED ORDER — BISACODYL 10 MG RE SUPP: 10.0000 mg | Freq: Every day | RECTAL | Status: DC | PRN

## 2018-11-11 MED ORDER — CEFAZOLIN SODIUM-DEXTROSE 2-4 GM/100ML-% IV SOLN
2.0000 g | Freq: Once | INTRAVENOUS | Status: AC
  Administered 2018-11-11: 2 g via INTRAVENOUS

## 2018-11-11 MED ORDER — FENTANYL CITRATE (PF) 100 MCG/2ML IJ SOLN
INTRAMUSCULAR | Status: AC
  Administered 2018-11-11: 50 ug via INTRAVENOUS
  Filled 2018-11-11: qty 2

## 2018-11-11 MED ORDER — SODIUM CHLORIDE 0.9 % IV SOLN
INTRAVENOUS | Status: DC
  Administered 2018-11-11: 17:00:00 via INTRAVENOUS

## 2018-11-11 MED ORDER — FENTANYL CITRATE (PF) 250 MCG/5ML IJ SOLN
INTRAMUSCULAR | Status: DC | PRN
  Administered 2018-11-11 (×5): 50 ug via INTRAVENOUS

## 2018-11-11 MED ORDER — METHOCARBAMOL 1000 MG/10ML IJ SOLN
500.0000 mg | Freq: Four times a day (QID) | INTRAVENOUS | Status: DC | PRN
  Administered 2018-11-11: 500 mg via INTRAVENOUS
  Filled 2018-11-11: qty 500

## 2018-11-11 MED ORDER — SUCCINYLCHOLINE CHLORIDE 200 MG/10ML IV SOSY
PREFILLED_SYRINGE | INTRAVENOUS | Status: DC | PRN
  Administered 2018-11-11: 100 mg via INTRAVENOUS

## 2018-11-11 MED ORDER — 0.9 % SODIUM CHLORIDE (POUR BTL) OPTIME
TOPICAL | Status: DC | PRN
  Administered 2018-11-11: 14:00:00 1000 mL

## 2018-11-11 MED ORDER — DOCUSATE SODIUM 100 MG PO CAPS: 100.0000 mg | ORAL_CAPSULE | Freq: Two times a day (BID) | ORAL | Status: DC

## 2018-11-11 MED ORDER — PROMETHAZINE HCL 25 MG/ML IJ SOLN: 6.2500 mg | INTRAMUSCULAR | Status: DC | PRN

## 2018-11-11 MED ORDER — FENTANYL CITRATE (PF) 250 MCG/5ML IJ SOLN
INTRAMUSCULAR | Status: AC
  Filled 2018-11-11: qty 5

## 2018-11-11 MED ORDER — FENTANYL CITRATE (PF) 100 MCG/2ML IJ SOLN
25.0000 ug | INTRAMUSCULAR | Status: DC | PRN
  Administered 2018-11-11: 25 ug via INTRAVENOUS
  Administered 2018-11-11: 50 ug via INTRAVENOUS

## 2018-11-11 MED ORDER — METHOCARBAMOL 500 MG PO TABS: 500.0000 mg | ORAL_TABLET | Freq: Four times a day (QID) | ORAL | Status: DC | PRN

## 2018-11-11 MED ORDER — DIPHENHYDRAMINE HCL 12.5 MG/5ML PO ELIX: 12.5000 mg | ORAL_SOLUTION | ORAL | Status: DC | PRN

## 2018-11-11 MED ORDER — PROPOFOL 10 MG/ML IV BOLUS
INTRAVENOUS | Status: DC | PRN
  Administered 2018-11-11: 80 mg via INTRAVENOUS

## 2018-11-11 MED ORDER — CEFAZOLIN SODIUM-DEXTROSE 2-4 GM/100ML-% IV SOLN
INTRAVENOUS | Status: AC
  Filled 2018-11-11: qty 100

## 2018-11-11 MED ORDER — METOCLOPRAMIDE HCL 5 MG/ML IJ SOLN: 5.0000 mg | Freq: Three times a day (TID) | INTRAMUSCULAR | Status: DC | PRN

## 2018-11-11 MED ORDER — TRAMADOL HCL 50 MG PO TABS
50.0000 mg | ORAL_TABLET | Freq: Four times a day (QID) | ORAL | Status: DC | PRN
  Administered 2018-11-12 – 2018-11-14 (×2): 50 mg via ORAL
  Filled 2018-11-11 (×2): qty 1

## 2018-11-11 SURGICAL SUPPLY — 45 items
APL PRP STRL LF DISP 70% ISPRP (MISCELLANEOUS) ×1
BAG DECANTER FOR FLEXI CONT (MISCELLANEOUS) ×3 IMPLANT
BAG SPEC THK2 15X12 ZIP CLS (MISCELLANEOUS)
BAG ZIPLOCK 12X15 (MISCELLANEOUS) IMPLANT
BLADE SAG 18X100X1.27 (BLADE) ×3 IMPLANT
BLADE SURG SZ10 CARB STEEL (BLADE) ×6 IMPLANT
CHLORAPREP W/TINT 26 (MISCELLANEOUS) ×3 IMPLANT
CLOSURE WOUND 1/2 X4 (GAUZE/BANDAGES/DRESSINGS) ×2
COVER PERINEAL POST (MISCELLANEOUS) ×3 IMPLANT
COVER SURGICAL LIGHT HANDLE (MISCELLANEOUS) ×3 IMPLANT
COVER WAND RF STERILE (DRAPES) IMPLANT
CUP ACETBLR 54 OD PINNACLE (Hips) ×2 IMPLANT
DECANTER SPIKE VIAL GLASS SM (MISCELLANEOUS) ×3 IMPLANT
DRAPE STERI IOBAN 125X83 (DRAPES) ×3 IMPLANT
DRAPE U-SHAPE 47X51 STRL (DRAPES) ×6 IMPLANT
DRSG ADAPTIC 3X8 NADH LF (GAUZE/BANDAGES/DRESSINGS) ×3 IMPLANT
DRSG MEPILEX BORDER 4X4 (GAUZE/BANDAGES/DRESSINGS) ×3 IMPLANT
DRSG MEPILEX BORDER 4X8 (GAUZE/BANDAGES/DRESSINGS) ×3 IMPLANT
ELECT BLADE TIP CTD 4 INCH (ELECTRODE) ×3 IMPLANT
ELECT REM PT RETURN 15FT ADLT (MISCELLANEOUS) ×3 IMPLANT
EVACUATOR 1/8 PVC DRAIN (DRAIN) ×3 IMPLANT
GLOVE BIO SURGEON STRL SZ7 (GLOVE) ×3 IMPLANT
GLOVE BIO SURGEON STRL SZ8 (GLOVE) ×3 IMPLANT
GLOVE BIOGEL PI IND STRL 7.0 (GLOVE) ×1 IMPLANT
GLOVE BIOGEL PI IND STRL 8 (GLOVE) ×1 IMPLANT
GLOVE BIOGEL PI INDICATOR 7.0 (GLOVE) ×2
GLOVE BIOGEL PI INDICATOR 8 (GLOVE) ×2
GOWN STRL REUS W/TWL LRG LVL3 (GOWN DISPOSABLE) ×6 IMPLANT
HEAD M SROM 36MM PLUS 1.5 (Hips) IMPLANT
HOLDER FOLEY CATH W/STRAP (MISCELLANEOUS) ×3 IMPLANT
KIT TURNOVER KIT A (KITS) IMPLANT
LINER MARATHON NEUT +4X54X36 (Hips) ×2 IMPLANT
MANIFOLD NEPTUNE II (INSTRUMENTS) ×3 IMPLANT
PACK ANTERIOR HIP CUSTOM (KITS) ×3 IMPLANT
SROM M HEAD 36MM PLUS 1.5 (Hips) ×3 IMPLANT
STEM FEM ACTIS STD SZ7 (Nail) ×2 IMPLANT
STRIP CLOSURE SKIN 1/2X4 (GAUZE/BANDAGES/DRESSINGS) ×3 IMPLANT
SUT ETHIBOND NAB CT1 #1 30IN (SUTURE) ×3 IMPLANT
SUT MNCRL AB 4-0 PS2 18 (SUTURE) ×3 IMPLANT
SUT STRATAFIX 0 PDS 27 VIOLET (SUTURE) ×3
SUT VIC AB 2-0 CT1 27 (SUTURE) ×6
SUT VIC AB 2-0 CT1 TAPERPNT 27 (SUTURE) ×2 IMPLANT
SUTURE STRATFX 0 PDS 27 VIOLET (SUTURE) ×1 IMPLANT
SYR 50ML LL SCALE MARK (SYRINGE) IMPLANT
YANKAUER SUCT BULB TIP 10FT TU (MISCELLANEOUS) ×3 IMPLANT

## 2018-11-11 NOTE — Transfer of Care (Signed)
Immediate Anesthesia Transfer of Care Note  Patient: Joe Glover  Procedure(s) Performed: TOTAL HIP ARTHROPLASTY ANTERIOR APPROACH (Right Hip)  Patient Location: PACU  Anesthesia Type:General  Level of Consciousness: awake, alert  and drowsy  Airway & Oxygen Therapy: Patient Spontanous Breathing and Patient connected to face mask oxygen  Post-op Assessment: Report given to RN, Post -op Vital signs reviewed and stable and Patient moving all extremities  Post vital signs: Reviewed and stable  Last Vitals:  Vitals Value Taken Time  BP 113/97 11/11/2018  3:18 PM  Temp 36.8 C 11/11/2018  3:18 PM  Pulse 79 11/11/2018  3:25 PM  Resp 19 11/11/2018  3:25 PM  SpO2 100 % 11/11/2018  3:25 PM  Vitals shown include unvalidated device data.  Last Pain:  Vitals:   11/11/18 0845  TempSrc:   PainSc: 0-No pain      Patients Stated Pain Goal: 0 (78/46/96 2952)  Complications: No apparent anesthesia complications

## 2018-11-11 NOTE — Progress Notes (Signed)
Pt transported to OR as planned, talked to wife prior to leaving. SRP, RN

## 2018-11-11 NOTE — Interval H&P Note (Signed)
History and Physical Interval Note:  11/11/2018 12:47 PM  Joe Glover  has presented today for surgery, with the diagnosis of right hip fracture.  The various methods of treatment have been discussed with the patient and family. After consideration of risks, benefits and other options for treatment, the patient has consented to  Procedure(s) with comments: [place] (Right) - 100 as a surgical intervention.  The patient's history has been reviewed, patient examined, no change in status, stable for surgery.  I have reviewed the patient's chart and labs.  Questions were answered to the patient's satisfaction.     Pilar Plate Donna Snooks

## 2018-11-11 NOTE — Progress Notes (Signed)
Pt spoke with wife and son this am prior to surgery. Pt resting, HR decreased after Metoprolol administered. CHG completed per protocol. SRP, RN

## 2018-11-11 NOTE — Anesthesia Preprocedure Evaluation (Signed)
Anesthesia Evaluation  Patient identified by MRN, date of birth, ID band Patient awake    Reviewed: Allergy & Precautions, NPO status , Patient's Chart, lab work & pertinent test results  Airway Mallampati: II  TM Distance: >3 FB Neck ROM: Full    Dental  (+) Dental Advisory Given   Pulmonary sleep apnea , former smoker,    Pulmonary exam normal breath sounds clear to auscultation       Cardiovascular hypertension, Pt. on medications Normal cardiovascular exam Rhythm:Regular Rate:Normal     Neuro/Psych TIACVA negative psych ROS   GI/Hepatic negative GI ROS, Neg liver ROS,   Endo/Other  negative endocrine ROS  Renal/GU negative Renal ROS     Musculoskeletal negative musculoskeletal ROS (+)   Abdominal   Peds negative pediatric ROS (+)  Hematology negative hematology ROS (+) anemia ,   Anesthesia Other Findings   Reproductive/Obstetrics negative OB ROS                            Anesthesia Physical Anesthesia Plan  ASA: IV  Anesthesia Plan: General   Post-op Pain Management:    Induction: Intravenous and Rapid sequence  PONV Risk Score and Plan: 3 and Dexamethasone and Ondansetron  Airway Management Planned: Oral ETT  Additional Equipment: None  Intra-op Plan:   Post-operative Plan: Extubation in OR  Informed Consent: I have reviewed the patients History and Physical, chart, labs and discussed the procedure including the risks, benefits and alternatives for the proposed anesthesia with the patient or authorized representative who has indicated his/her understanding and acceptance.     Dental advisory given  Plan Discussed with: CRNA  Anesthesia Plan Comments:         Anesthesia Quick Evaluation

## 2018-11-11 NOTE — Progress Notes (Signed)
PROGRESS NOTE    Masiyah Engen  WSF:[id number] DOB: 1924/09/07 DOA: 11/09/2018 PCP: [name], MD    Brief Narrative:  85 year old male who presented with right hip pain. He does have significant past medical history for paroxysmal atrial fibrillation, hypertension, and orthostatic hypotension.  Patient sustained a mechanical fall about 5 days prior to hospitalization while walking down the stairs.  He developed progressive and worsening right hip pain, with difficulty ambulating, he was seen by his primary care provider, and he was diagnosed with a right hip fracture.  On his initial physical examination he was afebrile, his heart rate was between 90 and 100, respiratory rate 16, normal blood pressure.  His lungs are clear to auscultation bilaterally, heart S1-S2 present, irregularly irregular, abdomen soft, no lower extremity edema, his right lower extremity was shortened and laterally rotated.  EKG had 104 bpm, atrial fibrillation, left axis deviation, no significant ST segment or T wave changes, positive PACs and PVCs.  Patient was admitted to the hospital working diagnosis of right hip fracture.   Assessment & Plan:   Active Problems:   OSA (obstructive sleep apnea)   Insomnia   Parkinson disease (HCC)   Hyponatremia   Hypokalemia   Essential hypertension   Orthostatic hypotension   PAF (paroxysmal atrial fibrillation) ([place])   Closed right hip fracture (HCC)  1. Right hip fracture. Patient underwent today right total hip arthroplasty with no major complications. Will continue pain control, DVT prophylaxis and physical therapy evaluation. His wife called and prefers him to be discharge to home instead of SNF when stable for discharge. Continue to follow orthopedics recommendations.   2. Paroxysmal atrial fibrillation. Patient with rapid ventricular rate this am, personally reviewed telemetry. Has tolerated well metoprolol 12,5 mg po, converting to sinus rhythm this pm. Will  continue rate control with 12,5 mg of metoprolol bid and as needed IV metoprolol 5 mg for heart rate above 130 bpm. Patient is know to have bradycardic episodes, will continue close telemetry monitoring. His echocardiogram showed LV EF of 55 to 60%in 2018.   3. Hyponatremia and hypokalemia. Serum K at 4,4 and Na at 130, preserved renal function with serum cr at 1,0.   4. Parkinson's disease. Continue neuro checks per unit protocol, physical therapy evaluation.    DVT prophylaxis: enoxaparin   Code Status: dnr  Family Communication: no family at the bedside  Disposition Plan/ discharge barriers: pending clinical improvement   Body mass index is 20.96 kg/m. Malnutrition Type:  Nutrition Problem: Increased nutrient needs Etiology: (hip fracture)   Malnutrition Characteristics:  Signs/Symptoms: estimated needs   Nutrition Interventions:  Interventions: Ensure Enlive (each supplement provides 350kcal and 20 grams of protein)  RN Pressure Injury Documentation:     Consultants:   Orthopedics   Cardiology   Procedures:   right hip total arthroplasty   Antimicrobials:       Subjective: This am patient with rapid ventricular response, at 130 bpm, with no significant pain or palpitations.   Objective: Vitals:   11/10/18 1348 11/10/18 2254 11/10/18 2258 11/11/18 0527  BP: 109/77 126/82  133/85  Pulse: (!) 47 88 69 62  Resp: 20 20 20 20   Temp: 97.9 F (36.6 C) 99 F (37.2 C)  (!) 97 F (36.1 C)  TempSrc: Oral Oral  Oral  SpO2: 98% 96% 98% 98%  Weight:      Height:        Intake/Output Summary (Last 24 hours) at 11/11/2018 0174 Last data filed at  11/10/2018 2303 Gross per 24 hour  Intake 120 ml  Output 900 ml  Net -780 ml   Filed Weights   11/09/18 2252  Weight: 70.1 kg    Examination:   General: deconditioned not in pain.  Neurology: Awake and alert, non focal  E ENT: positive pallor, no icterus, oral mucosa moist Cardiovascular: No JVD. S1-S2  present, rhythmic, no gallops, rubs, or murmurs. Trace lower extremity edema. Pulmonary:positive breath sounds bilaterally, adequate air movement, no wheezing, rhonchi or rales. Gastrointestinal. Abdomen with no organomegaly, non tender, no rebound or guarding Skin. No rashes Musculoskeletal: right leg shortened and laterally rotated.      Data Reviewed: I have personally reviewed following labs and imaging studies  CBC: Recent Labs  Lab 11/09/18 1525 11/11/18 0457  WBC 7.6 5.8  NEUTROABS 5.8  --   HGB 13.8 12.2*  HCT 41.3 37.8*  MCV 84.5 85.9  PLT 207 034   Basic Metabolic Panel: Recent Labs  Lab 11/09/18 1525 11/10/18 0131 11/11/18 0457  NA 129* 128* 133*  K 3.4* 3.5 4.4  CL 95* 100 102  CO2 22 19* 21*  GLUCOSE 116* 104* 89  BUN 23 22 21   CREATININE 0.97 0.89 1.00  CALCIUM 8.4* 7.9* 7.9*  MG  --  1.7  --    GFR: Estimated Creatinine Clearance: 46.7 mL/min (by C-G formula based on SCr of 1 mg/dL). Liver Function Tests: Recent Labs  Lab 11/09/18 1525  AST 42*  ALT 47*  ALKPHOS 71  BILITOT 1.2  PROT 7.6  ALBUMIN 3.4*   No results for input(s): LIPASE, AMYLASE in the last 168 hours. No results for input(s): AMMONIA in the last 168 hours. Coagulation Profile: No results for input(s): INR, PROTIME in the last 168 hours. Cardiac Enzymes: No results for input(s): CKTOTAL, CKMB, CKMBINDEX, TROPONINI in the last 168 hours. BNP (last 3 results) No results for input(s): PROBNP in the last 8760 hours. HbA1C: No results for input(s): HGBA1C in the last 72 hours. CBG: No results for input(s): GLUCAP in the last 168 hours. Lipid Profile: No results for input(s): CHOL, HDL, LDLCALC, TRIG, CHOLHDL, LDLDIRECT in the last 72 hours. Thyroid Function Tests: No results for input(s): TSH, T4TOTAL, FREET4, T3FREE, THYROIDAB in the last 72 hours. Anemia Panel: No results for input(s): VITAMINB12, FOLATE, FERRITIN, TIBC, IRON, RETICCTPCT in the last 72 hours.     Radiology Studies: I have reviewed all of the imaging during this hospital visit personally     Scheduled Meds: . chlorhexidine  15 mL Mouth Rinse BID  . docusate sodium  100 mg Oral BID  . feeding supplement (ENSURE ENLIVE)  237 mL Oral BID BM  . ipratropium  1 spray Nasal BID  . loratadine  10 mg Oral QHS  . magnesium oxide  800 mg Oral BID  . mouth rinse  15 mL Mouth Rinse q12n4p  . metoprolol tartrate  25 mg Oral BID  . midodrine  2.5 mg Oral BID  . pantoprazole  40 mg Oral Daily  . potassium chloride SA  10 mEq Oral Daily  . pravastatin  40 mg Oral QHS  . senna  1 tablet Oral BID  . sodium chloride  1 g Oral Daily   Continuous Infusions:   LOS: 2 days        Mauricio Gerome Apley, MD

## 2018-11-11 NOTE — Op Note (Signed)
OPERATIVE REPORT- TOTAL HIP ARTHROPLASTY   PREOPERATIVE DIAGNOSIS: Right femoral neck fracture  POSTOPERATIVE DIAGNOSIS: Right femoral neck fracture  PROCEDURE: Right total hip arthroplasty, anterior approach.   SURGEON: Gaynelle Arabian, MD   ASSISTANT: Ardeen Jourdain, PA-C  ANESTHESIA:  General  ESTIMATED BLOOD LOSS:-350 mL    DRAINS: Hemovac x1.   COMPLICATIONS: None   CONDITION: PACU - hemodynamically stable.   BRIEF CLINICAL NOTE: Joe Glover is a 85 y.o. male who had a fall a week ago and presented to the ED 2 nights ago with a displaced femoral neck fracture. He has been cleared medically and presents for right Total Hip Arthroplasty PROCEDURE IN DETAIL: After successful administration of spinal  anesthetic, the traction boots for the [facility] bed were placed on both  feet and the patient was placed onto the [facility] bed, boots placed into the leg  holders. The Right hip was then isolated from the perineum with plastic  drapes and prepped and draped in the usual sterile fashion. ASIS and  greater trochanter were marked and a oblique incision was made, starting  at about 1 cm lateral and 2 cm distal to the ASIS and coursing towards  the anterior cortex of the femur. The skin was cut with a 10 blade  through subcutaneous tissue to the level of the fascia overlying the  tensor fascia lata muscle. The fascia was then incised in line with the  incision at the junction of the anterior third and posterior 2/3rd. The  muscle was teased off the fascia and then the interval between the TFL  and the rectus was developed. The Hohmann retractor was then placed at  the top of the femoral neck over the capsule. The vessels overlying the  capsule were cauterized and the fat on top of the capsule was removed.  A Hohmann retractor was then placed anterior underneath the rectus  femoris to give exposure to the entire anterior capsule. A T-shaped  capsulotomy was performed. The edges  were tagged and the femoral head  was identified.       Osteophytes are removed off the superior acetabulum.  The femoral neck was then cut in situ with an oscillating saw. Traction  was then applied to the left lower extremity utilizing the [facility]  traction. The femoral head was then removed. Retractors were placed  around the acetabulum and then circumferential removal of the labrum was  performed. Osteophytes were also removed. Reaming starts at 49 mm to  medialize and  Increased in 2 mm increments to 53 mm. We reamed in  approximately 40 degrees of abduction, 20 degrees anteversion. A 54 mm  pinnacle acetabular shell was then impacted in anatomic position under  fluoroscopic guidance with excellent purchase. We did not need to place  any additional dome screws. A 36 mm neutral + 4 marathon liner was then  placed into the acetabular shell.       The femoral lift was then placed along the lateral aspect of the femur  just distal to the vastus ridge. The leg was  externally rotated and capsule  was stripped off the inferior aspect of the femoral neck down to the  level of the lesser trochanter, this was done with electrocautery. The femur was lifted after this was performed. The  leg was then placed in an extended and adducted position essentially delivering the femur. We also removed the capsule superiorly and the piriformis from the piriformis fossa to gain excellent exposure of  the  proximal femur. Rongeur was used to remove some cancellous bone to get  into the lateral portion of the proximal femur for placement of the  initial starter reamer. The starter broaches was placed  the starter broach  and was shown to go down the center of the canal. Broaching  with the Actis system was then performed starting at size 0  coursing  Up to size 7. A size 7 had excellent torsional and rotational  and axial stability. The trial standard offset neck was then placed  with a 36 + 1.5 trial head. The  hip was then reduced. We confirmed that  the stem was in the canal both on AP and lateral x-rays. It also has excellent sizing. The hip was reduced with outstanding stability through full extension and full external rotation.. AP pelvis was taken and the leg lengths were measured and found to be equal. Hip was then dislocated again and the femoral head and neck removed. The  femoral broach was removed. Size 7 Actis stem with a high offset  neck was then impacted into the femur following native anteversion. Has  excellent purchase in the canal. Excellent torsional and rotational and  axial stability. It is confirmed to be in the canal on AP and lateral  fluoroscopic views. The 36 + 1.5 metal head was placed and the hip  reduced with outstanding stability. Again AP pelvis was taken and it  confirmed that the leg lengths were equal. The wound was then copiously  irrigated with saline solution and the capsule reattached and repaired  with Ethibond suture. 30 ml of .25% Bupivicaine was  injected into the capsule and into the edge of the tensor fascia lata as well as subcutaneous tissue. The fascia overlying the tensor fascia lata was then closed with a running #1 V-Loc. Subcu was closed with interrupted 2-0 Vicryl and subcuticular running 4-0 Monocryl. Incision was cleaned  and dried. Steri-Strips and a bulky sterile dressing applied. Hemovac  drain was hooked to suction and then the patient was awakened and transported to  recovery in stable condition.        Please note that a surgical assistant was a medical necessity for this procedure to perform it in a safe and expeditious manner. Assistant was necessary to provide appropriate retraction of vital neurovascular structures and to prevent femoral fracture and allow for anatomic placement of the prosthesis.  Gaynelle Arabian, M.D.

## 2018-11-11 NOTE — Progress Notes (Signed)
Pt returned from surgery/PACU, arouses easily. Rt hip operative site D/I, Hemovac charged. Denies pain, VSs. SRP, RN

## 2018-11-11 NOTE — Progress Notes (Signed)
Pt. placed on CPAP auto for h/s, RN made aware prior to placement, humidifier refilled with s/w, placed oxygen inline per current post op setting of 2lpm, tolerating well.

## 2018-11-11 NOTE — Progress Notes (Signed)
Pt took off and is refusing CPAP

## 2018-11-11 NOTE — Progress Notes (Signed)
Increased HR while lying still in bed. HR 115-134 last hour. Metoprolol 2.5mg  given as ordered, pt. NPO for surgery. SRP, RN

## 2018-11-11 NOTE — Anesthesia Postprocedure Evaluation (Signed)
Anesthesia Post Note  Patient: Manufacturing systems engineer  Procedure(s) Performed: TOTAL HIP ARTHROPLASTY ANTERIOR APPROACH (Right Hip)     Patient location during evaluation: PACU Anesthesia Type: General Level of consciousness: sedated and patient cooperative Pain management: pain level controlled Vital Signs Assessment: post-procedure vital signs reviewed and stable Respiratory status: spontaneous breathing Cardiovascular status: stable Anesthetic complications: no    Last Vitals:  Vitals:   11/11/18 1600 11/11/18 1631  BP:  (!) 142/96  Pulse: 80 79  Resp: 19 18  Temp:  36.9 C  SpO2: 96% 98%    Last Pain:  Vitals:   11/11/18 1631  TempSrc: Oral  PainSc:                  Nolon Nations

## 2018-11-11 NOTE — Discharge Instructions (Signed)
°Dr. Frank Aluisio °Total Joint Specialist °[facility] °[address] °[place], [place] [address] °[phone] ° °ANTERIOR APPROACH TOTAL HIP REPLACEMENT POSTOPERATIVE DIRECTIONS ° ° °Hip Rehabilitation, Guidelines Following Surgery  °The results of a hip operation are greatly improved after range of motion and muscle strengthening exercises. Follow all safety measures which are given to protect your hip. If any of these exercises cause increased pain or swelling in your joint, decrease the amount until you are comfortable again. Then slowly increase the exercises. Call your caregiver if you have problems or questions.  ° °HOME CARE INSTRUCTIONS  °• Remove items at home which could result in a fall. This includes throw rugs or furniture in walking pathways.  °· ICE to the affected hip every three hours for 30 minutes at a time and then as needed for pain and swelling.  Continue to use ice on the hip for pain and swelling from surgery. You may notice swelling that will progress down to the foot and ankle.  This is normal after surgery.  Elevate the leg when you are not up walking on it.   °· Continue to use the breathing machine which will help keep your temperature down.  It is common for your temperature to cycle up and down following surgery, especially at night when you are not up moving around and exerting yourself.  The breathing machine keeps your lungs expanded and your temperature down. ° °DIET °You may resume your previous home diet once your are discharged from the hospital. ° °DRESSING / WOUND CARE / SHOWERING °You may shower 3 days after surgery, but keep the wounds dry during showering.  You may use an occlusive plastic wrap (Press'n Seal for example), NO SOAKING/SUBMERGING IN THE BATHTUB.  If the bandage gets wet, change with a clean dry gauze.  If the incision gets wet, pat the wound dry with a clean towel. °You may start showering once you are discharged home but do not submerge the  incision under water. Just pat the incision dry and apply a dry gauze dressing on daily. °Change the surgical dressing daily and reapply a dry dressing each time. ° °ACTIVITY °Walk with your walker as instructed. °Use walker as long as suggested by your caregivers. °Avoid periods of inactivity such as sitting longer than an hour when not asleep. This helps prevent blood clots.  °You may resume a sexual relationship in one month or when given the OK by your doctor.  °You may return to work once you are cleared by your doctor.  °Do not drive a car for 6 weeks or until released by you surgeon.  °Do not drive while taking narcotics. ° °WEIGHT BEARING °Weight bearing as tolerated with assist device (walker, cane, etc) as directed, use it as long as suggested by your surgeon or therapist, typically at least 4-6 weeks. ° °POSTOPERATIVE CONSTIPATION PROTOCOL °Constipation - defined medically as fewer than three stools per week and severe constipation as less than one stool per week. ° °One of the most common issues patients have following surgery is constipation.  Even if you have a regular bowel pattern at home, your normal regimen is likely to be disrupted due to multiple reasons following surgery.  Combination of anesthesia, postoperative narcotics, change in appetite and fluid intake all can affect your bowels.  In order to avoid complications following surgery, here are some recommendations in order to help you during your recovery period. ° °Colace (docusate) - Pick up an over-the-counter form   of Colace or another stool softener and take twice a day as long as you are requiring postoperative pain medications.  Take with a full glass of water daily.  If you experience loose stools or diarrhea, hold the colace until you stool forms back up.  If your symptoms do not get better within 1 week or if they get worse, check with your doctor. ° °Dulcolax (bisacodyl) - Pick up over-the-counter and take as directed by the product  packaging as needed to assist with the movement of your bowels.  Take with a full glass of water.  Use this product as needed if not relieved by Colace only.  ° °MiraLax (polyethylene glycol) - Pick up over-the-counter to have on hand.  MiraLax is a solution that will increase the amount of water in your bowels to assist with bowel movements.  Take as directed and can mix with a glass of water, juice, soda, coffee, or tea.  Take if you go more than two days without a movement. °Do not use MiraLax more than once per day. Call your doctor if you are still constipated or irregular after using this medication for 7 days in a row. ° °If you continue to have problems with postoperative constipation, please contact the office for further assistance and recommendations.  If you experience "the worst abdominal pain ever" or develop nausea or vomiting, please contact the office immediatly for further recommendations for treatment. ° °ITCHING ° If you experience itching with your medications, try taking only a single pain pill, or even half a pain pill at a time.  You can also use Benadryl over the counter for itching or also to help with sleep.  ° °TED HOSE STOCKINGS °Wear the elastic stockings on both legs for three weeks following surgery during the day but you may remove then at night for sleeping. ° °MEDICATIONS °See your medication summary on the “After Visit Summary” that the nursing staff will review with you prior to discharge.  You may have some home medications which will be placed on hold until you complete the course of blood thinner medication.  It is important for you to complete the blood thinner medication as prescribed by your surgeon.  Continue your approved medications as instructed at time of discharge. ° °PRECAUTIONS °If you experience chest pain or shortness of breath - call 911 immediately for transfer to the hospital emergency department.  °If you develop a fever greater that 101 F, purulent drainage  from wound, increased redness or drainage from wound, foul odor from the wound/dressing, or calf pain - CONTACT YOUR SURGEON.   °                                                °FOLLOW-UP APPOINTMENTS °Make sure you keep all of your appointments after your operation with your surgeon and caregivers. You should call the office at the above phone number and make an appointment for approximately two weeks after the date of your surgery or on the date instructed by your surgeon outlined in the "After Visit Summary". ° °RANGE OF MOTION AND STRENGTHENING EXERCISES  °These exercises are designed to help you keep full movement of your hip joint. Follow your caregiver's or physical therapist's instructions. Perform all exercises about fifteen times, three times per day or as directed. Exercise both hips, even if you have   had only one joint replacement. These exercises can be done on a training (exercise) mat, on the floor, on a table or on a bed. Use whatever works the best and is most comfortable for you. Use music or television while you are exercising so that the exercises are a pleasant break in your day. This will make your life better with the exercises acting as a break in routine you can look forward to.  °• Lying on your back, slowly slide your foot toward your buttocks, raising your knee up off the floor. Then slowly slide your foot back down until your leg is straight again.  °• Lying on your back spread your legs as far apart as you can without causing discomfort.  °• Lying on your side, raise your upper leg and foot straight up from the floor as far as is comfortable. Slowly lower the leg and repeat.  °• Lying on your back, tighten up the muscle in the front of your thigh (quadriceps muscles). You can do this by keeping your leg straight and trying to raise your heel off the floor. This helps strengthen the largest muscle supporting your knee.  °• Lying on your back, tighten up the muscles of your buttocks both  with the legs straight and with the knee bent at a comfortable angle while keeping your heel on the floor.  ° °IF YOU ARE TRANSFERRED TO A SKILLED REHAB FACILITY °If the patient is transferred to a skilled rehab facility following release from the hospital, a list of the current medications will be sent to the facility for the patient to continue.  When discharged from the skilled rehab facility, please have the facility set up the patient's Home Health Physical Therapy prior to being released. Also, the skilled facility will be responsible for providing the patient with their medications at time of release from the facility to include their pain medication, the muscle relaxants, and their blood thinner medication. If the patient is still at the rehab facility at time of the two week follow up appointment, the skilled rehab facility will also need to assist the patient in arranging follow up appointment in our office and any transportation needs. ° °MAKE SURE YOU:  °• Understand these instructions.  °• Get help right away if you are not doing well or get worse.  ° ° °Pick up stool softner and laxative for home use following surgery while on pain medications. °Do not submerge incision under water. °Please use good hand washing techniques while changing dressing each day. °May shower starting three days after surgery. °Please use a clean towel to pat the incision dry following showers. °Continue to use ice for pain and swelling after surgery. °Do not use any lotions or creams on the incision until instructed by your surgeon. ° °

## 2018-11-11 NOTE — Anesthesia Procedure Notes (Signed)
Procedure Name: Intubation Date/Time: 11/11/2018 1:33 PM Performed by: Mitzie Na, CRNA Pre-anesthesia Checklist: Patient identified, Emergency Drugs available, Suction available, Patient being monitored and Timeout performed Patient Re-evaluated:Patient Re-evaluated prior to induction Oxygen Delivery Method: Circle system utilized Preoxygenation: Pre-oxygenation with 100% oxygen Induction Type: IV induction and Rapid sequence Laryngoscope Size: Mac and 4 Grade View: Grade II Tube size: 7.5 mm Number of attempts: 1 Airway Equipment and Method: Stylet Placement Confirmation: ETT inserted through vocal cords under direct vision,  positive ETCO2 and breath sounds checked- equal and bilateral Secured at: 26 cm Tube secured with: Tape Dental Injury: Teeth and Oropharynx as per pre-operative assessment

## 2018-11-12 ENCOUNTER — Encounter (HOSPITAL_COMMUNITY): Payer: Self-pay | Admitting: Orthopedic Surgery

## 2018-11-12 LAB — BASIC METABOLIC PANEL
Anion gap: 11 (ref 5–15)
BUN: 23 mg/dL (ref 8–23)
CO2: 20 mmol/L — ABNORMAL LOW (ref 22–32)
Calcium: 7.6 mg/dL — ABNORMAL LOW (ref 8.9–10.3)
Chloride: 103 mmol/L (ref 98–111)
Creatinine, Ser: 0.89 mg/dL (ref 0.61–1.24)
GFR calc Af Amer: >60 mL/min (ref >60)
GFR calc non Af Amer: >60 mL/min (ref >60)
Glucose, Bld: 156 mg/dL — ABNORMAL HIGH (ref 70–99)
Potassium: 4.2 mmol/L (ref 3.5–5.1)
Sodium: 134 mmol/L — ABNORMAL LOW (ref 135–145)

## 2018-11-12 MED ORDER — LORAZEPAM 2 MG/ML IJ SOLN: 0.5000 mg | Freq: Once | INTRAMUSCULAR | Status: DC

## 2018-11-12 NOTE — Plan of Care (Signed)
  Problem: Clinical Measurements: Goal: Ability to maintain clinical measurements within normal limits will improve Outcome: Progressing Goal: Will remain free from infection Outcome: Progressing Goal: Diagnostic test results will improve Outcome: Progressing Goal: Respiratory complications will improve Outcome: Progressing Goal: Cardiovascular complication will be avoided Outcome: Progressing   Problem: Elimination: Goal: Will not experience complications related to urinary retention Outcome: Progressing   Problem: Pain Managment: Goal: General experience of comfort will improve Outcome: Progressing   Problem: Safety: Goal: Ability to remain free from injury will improve Outcome: Progressing   Problem: Clinical Measurements: Goal: Postoperative complications will be avoided or minimized Outcome: Progressing   Problem: Pain Management: Goal: Pain level will decrease Outcome: Progressing

## 2018-11-12 NOTE — Progress Notes (Signed)
PROGRESS NOTE    Hearl Heikes  JKK:[id number] DOB: 06-01-1925 DOA: 11/09/2018 PCP: [name], MD    Brief Narrative:  85 year old male who presented with right hip pain. He does have significant past medical history for paroxysmal atrial fibrillation, hypertension, and orthostatic hypotension.  Patient sustained a mechanical fall about 5 days prior to hospitalization while walking down the stairs.  He developed progressive and worsening right hip pain, with difficulty ambulating, he was seen by his primary care provider, and he was diagnosed with a right hip fracture.  On his initial physical examination he was afebrile, his heart rate was between 90 and 100, respiratory rate 16, normal blood pressure.  His lungs are clear to auscultation bilaterally, heart S1-S2 present, irregularly irregular, abdomen soft, no lower extremity edema, his right lower extremity was shortened and laterally rotated.  EKG had 104 bpm, atrial fibrillation, left axis deviation, no significant ST segment or T wave changes, positive PACs and PVCs.  Patient was admitted to the hospital working diagnosis of right hip fracture   Assessment & Plan:   Active Problems:   OSA (obstructive sleep apnea)   Insomnia   Parkinson disease (HCC)   Hyponatremia   Hypokalemia   Essential hypertension   Orthostatic hypotension   PAF (paroxysmal atrial fibrillation) ([place])   Closed right hip fracture (HCC)  1. Right hip fracture. No significant pain this am, he is out of bed to chair, continue pain control, DVT prophylaxis and physical therapy evaluation.  2. Paroxysmal atrial fibrillation/ echocardiogram showed LV EF of 55 to 60%in 2018.  Patient has remained on sinus rhythm personally reviewed telemetry, will continue metoprolol 12,5 mg po bid. As needd IV metoprolol 5 mg per for HR above 130 bpm. Not on anticoagulation due to history of subdural hematoma, ambulatory dysfunction and gastrointestinal bleeding in the  past.   3. Hyponatremia and hypokalemia. Patient is tolerating po well, his Na is 134, K at 4,2 and serum bicarbonate at 20, will follow on renal panel in am. Renal function preserved with serum cr at 0,89.   4. Parkinson's disease. Physical therapy evaluation. Out of bed to chair.   DVT prophylaxis: enoxaparin   Code Status: dnr  Family Communication: no family at the bedside  Disposition Plan/ discharge barriers: pending clinical improvement     Body mass index is 20.96 kg/m. Malnutrition Type:  Nutrition Problem: Increased nutrient needs Etiology: (hip fracture)   Malnutrition Characteristics:  Signs/Symptoms: estimated needs   Nutrition Interventions:  Interventions: Ensure Enlive (each supplement provides 350kcal and 20 grams of protein)  RN Pressure Injury Documentation:     Consultants:   Orthopedics  Cardiology   Procedures:     Antimicrobials:       Subjective: Patient is out of the bed to the chair with the help of physical therapy, no dyspnea and no chest pain. No hip pain.   Objective: Vitals:   11/12/18 0202 11/12/18 0503 11/12/18 0800 11/12/18 0801  BP: 122/85 126/87    Pulse: 89 85    Resp:  20    Temp:  97.8 F (36.6 C)    TempSrc:  Oral    SpO2:  99% 98% 98%  Weight:      Height:        Intake/Output Summary (Last 24 hours) at 11/12/2018 1251 Last data filed at 11/12/2018 0600 Gross per 24 hour  Intake 3297.55 ml  Output 1920 ml  Net 1377.55 ml   Filed Weights   11/09/18 2252  Weight: 70.1 kg    Examination:   General: Not in pain or dyspnea Neurology: Awake and alert, non focal  E ENT: no pallor, no icterus, oral mucosa moist Cardiovascular: No JVD. S1-S2 present, rhythmic, no gallops, rubs, or murmurs. No lower extremity edema. Pulmonary: positive breath sounds bilaterally, adequate air movement, no wheezing, rhonchi or rales. Gastrointestinal. Abdomen with no organomegaly, non tender, no rebound or guarding  Skin. No rashes Musculoskeletal: no joint deformities     Data Reviewed: I have personally reviewed following labs and imaging studies  CBC: Recent Labs  Lab 11/09/18 1525 11/11/18 0457  WBC 7.6 5.8  NEUTROABS 5.8  --   HGB 13.8 12.2*  HCT 41.3 37.8*  MCV 84.5 85.9  PLT 207 917   Basic Metabolic Panel: Recent Labs  Lab 11/09/18 1525 11/10/18 0131 11/11/18 0457 11/12/18 0457  NA 129* 128* 133* 134*  K 3.4* 3.5 4.4 4.2  CL 95* 100 102 103  CO2 22 19* 21* 20*  GLUCOSE 116* 104* 89 156*  BUN 23 22 21 23   CREATININE 0.97 0.89 1.00 0.89  CALCIUM 8.4* 7.9* 7.9* 7.6*  MG  --  1.7  --   --    GFR: Estimated Creatinine Clearance: 52.5 mL/min (by C-G formula based on SCr of 0.89 mg/dL). Liver Function Tests: Recent Labs  Lab 11/09/18 1525  AST 42*  ALT 47*  ALKPHOS 71  BILITOT 1.2  PROT 7.6  ALBUMIN 3.4*   No results for input(s): LIPASE, AMYLASE in the last 168 hours. No results for input(s): AMMONIA in the last 168 hours. Coagulation Profile: No results for input(s): INR, PROTIME in the last 168 hours. Cardiac Enzymes: No results for input(s): CKTOTAL, CKMB, CKMBINDEX, TROPONINI in the last 168 hours. BNP (last 3 results) No results for input(s): PROBNP in the last 8760 hours. HbA1C: No results for input(s): HGBA1C in the last 72 hours. CBG: Recent Labs  Lab 11/11/18 1627  GLUCAP 98   Lipid Profile: No results for input(s): CHOL, HDL, LDLCALC, TRIG, CHOLHDL, LDLDIRECT in the last 72 hours. Thyroid Function Tests: No results for input(s): TSH, T4TOTAL, FREET4, T3FREE, THYROIDAB in the last 72 hours. Anemia Panel: No results for input(s): VITAMINB12, FOLATE, FERRITIN, TIBC, IRON, RETICCTPCT in the last 72 hours.    Radiology Studies: I have reviewed all of the imaging during this hospital visit personally     Scheduled Meds: . chlorhexidine  15 mL Mouth Rinse BID  . docusate sodium  100 mg Oral BID  . enoxaparin (LOVENOX) injection  40 mg  Subcutaneous Q24H  . feeding supplement (ENSURE ENLIVE)  237 mL Oral BID BM  . ipratropium  1 spray Nasal BID  . loratadine  10 mg Oral QHS  . LORazepam  0.5 mg Intravenous Once  . magnesium oxide  800 mg Oral BID  . mouth rinse  15 mL Mouth Rinse q12n4p  . metoprolol tartrate  12.5 mg Oral BID  . midodrine  2.5 mg Oral BID  . pantoprazole  40 mg Oral Daily  . pravastatin  40 mg Oral QHS  . senna  1 tablet Oral BID  . sodium chloride  1 g Oral Daily   Continuous Infusions: . sodium chloride 75 mL/hr at 11/12/18 0600  . methocarbamol (ROBAXIN) IV 500 mg (11/11/18 1539)     LOS: 3 days        Leighla Chestnutt Gerome Apley, MD

## 2018-11-12 NOTE — Evaluation (Signed)
Physical Therapy Evaluation Patient Details Name: Joe Glover MRN: [id number] DOB: September 01, 1924 Today's Date: 11/12/2018   History of Present Illness  Pt s/p fall with R hip fx and now s/p R THR by anterior direct approach.  Pt with hx of CVA, SDH, PAF, Colon CA and falls  Clinical Impression  Pt s/p R THR and presents with decreased R LE strength/ROM, limited endurance and significant ambulatory balance deficits.  Pt is currently requiring assist of 2 for safe performance of basic mobility tasks and would benefit from follow up rehab at SNF level to maximize safety and independence prior to return home with assist of spouse.  Pt and family would prefer pt to return home with HHPT follow up but pt will need to make significant progress and/or have increased level of assist at home to make transition safely.    Follow Up Recommendations SNF    Equipment Recommendations  None recommended by PT    Recommendations for Other Services OT consult     Precautions / Restrictions Precautions Precautions: Fall Restrictions Weight Bearing Restrictions: No Other Position/Activity Restrictions: WBAT      Mobility  Bed Mobility Overal bed mobility: Needs Assistance Bed Mobility: Supine to Sit     Supine to sit: Min assist;+2 for physical assistance;+2 for safety/equipment     General bed mobility comments: cues for sequence and use of L LE to self assist.  Physical assist to manage R LE and to bring trunk to upright  Transfers Overall transfer level: Needs assistance Equipment used: Rolling walker (2 wheeled) Transfers: Sit to/from Stand Sit to Stand: Min assist;Mod assist;+2 physical assistance;+2 safety/equipment;From elevated surface         General transfer comment: cues for transition position, use of UEs to self assist and LE management  Ambulation/Gait Ambulation/Gait assistance: Min assist;Mod assist;+2 physical assistance;+2 safety/equipment Gait Distance (Feet): 10  Feet Assistive device: Rolling walker (2 wheeled) Gait Pattern/deviations: Step-to pattern;Decreased step length - right;Decreased step length - left;Decreased stance time - right;Shuffle;Trunk flexed Gait velocity: decr   General Gait Details: cues for sequence, posture, position from RW, and safe management of RW  Stairs            Wheelchair Mobility    Modified Rankin (Stroke Patients Only)       Balance Overall balance assessment: Needs assistance Sitting-balance support: Feet supported;No upper extremity supported Sitting balance-Leahy Scale: Good     Standing balance support: Bilateral upper extremity supported Standing balance-Leahy Scale: Poor                               Pertinent Vitals/Pain Pain Assessment: Faces Faces Pain Scale: Hurts little more Pain Location: R hip Pain Descriptors / Indicators: Grimacing Pain Intervention(s): Limited activity within patient's tolerance;Monitored during session;Premedicated before session    Home Living Family/patient expects to be discharged to:: Unsure Living Arrangements: Spouse/significant other               Additional Comments: Has a RW on every level    Prior Function Level of Independence: Needs assistance   Gait / Transfers Assistance Needed: Wife provides 24/7 supervision. Household amb with RW (have device on every level); uses rollator in community. Wife drives. Wife always present for stairs providing cues to "pick up feet"  ADL's / Homemaking Assistance Needed: Wife assists with bathing and dressing; pt indep with toileting  Comments: Function hx taken from previous note  Hand Dominance        Extremity/Trunk Assessment   Upper Extremity Assessment Upper Extremity Assessment: Overall WFL for tasks assessed    Lower Extremity Assessment Lower Extremity Assessment: RLE deficits/detail RLE Deficits / Details: Strength at hip 2+/5 with AAROM at hip to 90 flex and 20  abd       Communication   Communication: Other (comment)(Pt verbalizing but largely unintelligible)  Cognition Arousal/Alertness: Awake/alert Behavior During Therapy: WFL for tasks assessed/performed Overall Cognitive Status: Within Functional Limits for tasks assessed                                        General Comments      Exercises Total Joint Exercises Ankle Circles/Pumps: AROM;Both;15 reps;Supine Heel Slides: AAROM;Right;10 reps;Supine Hip ABduction/ADduction: AAROM;Right;10 reps;Supine   Assessment/Plan    PT Assessment Patient needs continued PT services  PT Problem List Decreased strength;Decreased range of motion;Decreased activity tolerance;Decreased balance;Decreased mobility;Pain;Decreased knowledge of use of DME       PT Treatment Interventions DME instruction;Gait training;Stair training;Functional mobility training;Therapeutic activities;Therapeutic exercise;Patient/family education    PT Goals (Current goals can be found in the Care Plan section)  Acute Rehab PT Goals Patient Stated Goal: Home PT Goal Formulation: With patient Time For Goal Achievement: 11/26/18 Potential to Achieve Goals: Good    Frequency Min 6X/week   Barriers to discharge Inaccessible home environment;Decreased caregiver support stairs into home and up to bedroom; pt home with elderly spouse    Co-evaluation               AM-PAC PT "6 Clicks" Mobility  Outcome Measure Help needed turning from your back to your side while in a flat bed without using bedrails?: A Little Help needed moving from lying on your back to sitting on the side of a flat bed without using bedrails?: A Lot Help needed moving to and from a bed to a chair (including a wheelchair)?: A Lot Help needed standing up from a chair using your arms (e.g., wheelchair or bedside chair)?: A Lot Help needed to walk in hospital room?: A Lot Help needed climbing 3-5 steps with a railing? : Total 6  Click Score: 12    End of Session Equipment Utilized During Treatment: Gait belt;Oxygen Activity Tolerance: Patient tolerated treatment well Patient left: in chair;with call bell/phone within reach;with chair alarm set Nurse Communication: Mobility status PT Visit Diagnosis: History of falling (Z91.81);Difficulty in walking, not elsewhere classified (R26.2)    Time: 0160-1093 PT Time Calculation (min) (ACUTE ONLY): 25 min   Charges:   PT Evaluation $PT Eval Low Complexity: 1 Low PT Treatments $Gait Training: 8-22 mins        Debe Coder PT Acute Rehabilitation Services Pager [phone] Office [phone]   Tayloranne Lekas 11/12/2018, 2:15 PM

## 2018-11-12 NOTE — Care Management Important Message (Signed)
Important Message  Patient Details IM Letter given to Sharren Bridge SW to present to the Patient Name: Joe Glover MRN: [id number] Date of Birth: [birth year]   Medicare Important Message Given:  Yes    Kerin Salen 11/12/2018, 11:13 AM

## 2018-11-12 NOTE — Progress Notes (Signed)
Physical Therapy Treatment Patient Details Name: Joe Glover MRN: [id number] DOB: 11/16/1924 Today's Date: 11/12/2018    History of Present Illness Pt s/p fall with R hip fx and now s/p R THR by anterior direct approach.  Pt with hx of CVA, SDH, PAF, Colon CA and falls    PT Comments    Pt continues very cooperative but very unstable on feet and requiring assist of two for safe performance of OOB activity.     Follow Up Recommendations  SNF     Equipment Recommendations  None recommended by PT    Recommendations for Other Services OT consult     Precautions / Restrictions Precautions Precautions: Fall Restrictions Weight Bearing Restrictions: No Other Position/Activity Restrictions: WBAT    Mobility  Bed Mobility Overal bed mobility: Needs Assistance Bed Mobility: Sit to Supine     Supine to sit: Min assist;+2 for physical assistance;+2 for safety/equipment Sit to supine: Min assist;Mod assist;+2 for physical assistance;+2 for safety/equipment   General bed mobility comments: cues for sequence and use of L LE to self assist.  Physical assist to manage R LE and to control trunk   Transfers Overall transfer level: Needs assistance Equipment used: Rolling walker (2 wheeled) Transfers: Sit to/from Stand Sit to Stand: Min assist;Mod assist;+2 physical assistance;+2 safety/equipment;From elevated surface         General transfer comment: cues for transition position, use of UEs to self assist and LE management  Ambulation/Gait Ambulation/Gait assistance: Min assist;Mod assist;+2 physical assistance;+2 safety/equipment Gait Distance (Feet): 13 Feet Assistive device: Rolling walker (2 wheeled) Gait Pattern/deviations: Step-to pattern;Decreased step length - right;Decreased step length - left;Decreased stance time - right;Shuffle;Trunk flexed Gait velocity: decr   General Gait Details: cues for sequence, posture, position from RW, and safe management of  RW   Stairs             Wheelchair Mobility    Modified Rankin (Stroke Patients Only)       Balance Overall balance assessment: Needs assistance Sitting-balance support: Feet supported;No upper extremity supported Sitting balance-Leahy Scale: Good     Standing balance support: Bilateral upper extremity supported Standing balance-Leahy Scale: Poor                              Cognition Arousal/Alertness: Awake/alert Behavior During Therapy: WFL for tasks assessed/performed Overall Cognitive Status: Within Functional Limits for tasks assessed                                        Exercises Total Joint Exercises Ankle Circles/Pumps: AROM;Both;15 reps;Supine Heel Slides: AAROM;Right;10 reps;Supine Hip ABduction/ADduction: AAROM;Right;10 reps;Supine    General Comments        Pertinent Vitals/Pain Pain Assessment: Faces Faces Pain Scale: Hurts little more Pain Location: R hip Pain Descriptors / Indicators: Grimacing Pain Intervention(s): Limited activity within patient's tolerance;Monitored during session    Home Living Family/patient expects to be discharged to:: Unsure Living Arrangements: Spouse/significant other             Additional Comments: Has a RW on every level    Prior Function Level of Independence: Needs assistance  Gait / Transfers Assistance Needed: Wife provides 24/7 supervision. Household amb with RW (have device on every level); uses rollator in community. Wife drives. Wife always present for stairs providing cues to "pick up feet" ADL's / Homemaking  Assistance Needed: Wife assists with bathing and dressing; pt indep with toileting Comments: Function hx taken from previous note   PT Goals (current goals can now be found in the care plan section) Acute Rehab PT Goals Patient Stated Goal: Home PT Goal Formulation: With patient Time For Goal Achievement: 11/26/18 Potential to Achieve Goals: Good Progress  towards PT goals: Progressing toward goals    Frequency    Min 6X/week      PT Plan Current plan remains appropriate    Co-evaluation              AM-PAC PT "6 Clicks" Mobility   Outcome Measure  Help needed turning from your back to your side while in a flat bed without using bedrails?: A Little Help needed moving from lying on your back to sitting on the side of a flat bed without using bedrails?: A Lot Help needed moving to and from a bed to a chair (including a wheelchair)?: A Lot Help needed standing up from a chair using your arms (e.g., wheelchair or bedside chair)?: A Lot Help needed to walk in hospital room?: A Lot Help needed climbing 3-5 steps with a railing? : Total 6 Click Score: 12    End of Session Equipment Utilized During Treatment: Gait belt;Oxygen Activity Tolerance: Patient tolerated treatment well Patient left: in bed;with call bell/phone within reach;with nursing/sitter in room Nurse Communication: Mobility status PT Visit Diagnosis: History of falling (Z91.81);Difficulty in walking, not elsewhere classified (R26.2)     Time: 5366-4403 PT Time Calculation (min) (ACUTE ONLY): 15 min  Charges:  $Gait Training: 8-22 mins                     [place] Pager [phone] Office [phone]    Kiyani Jernigan 11/12/2018, 2:28 PM

## 2018-11-12 NOTE — Progress Notes (Signed)
   Subjective: 1 Day Post-Op Procedure(s) (LRB): TOTAL HIP ARTHROPLASTY ANTERIOR APPROACH (Right) Patient reports pain as mild.   We will start therapy today.  Plan is to go Home after hospital stay.  Objective: Vital signs in last 24 hours: Temp:  [97.8 F (36.6 C)-98.9 F (37.2 C)] 97.8 F (36.6 C) (05/07 0503) Pulse Rate:  [73-134] 85 (05/07 0503) Resp:  [14-21] 20 (05/07 0503) BP: (113-150)/(85-111) 126/87 (05/07 0503) SpO2:  [94 %-100 %] 99 % (05/07 0503)  Intake/Output from previous day:  Intake/Output Summary (Last 24 hours) at 11/12/2018 0644 Last data filed at 11/12/2018 0600 Gross per 24 hour  Intake 3297.55 ml  Output 1920 ml  Net 1377.55 ml    Intake/Output this shift: Total I/O In: 1454 [P.O.:360; I.V.:824; Other:70; IV Piggyback:200] Out: 680 [Urine:600; Drains:80]  Labs: Recent Labs    11/09/18 1525 11/11/18 0457  HGB 13.8 12.2*   Recent Labs    11/09/18 1525 11/11/18 0457  WBC 7.6 5.8  RBC 4.89 4.40  HCT 41.3 37.8*  PLT 207 204   Recent Labs    11/11/18 0457 11/12/18 0457  NA 133* 134*  K 4.4 4.2  CL 102 103  CO2 21* 20*  BUN 21 23  CREATININE 1.00 0.89  GLUCOSE 89 156*  CALCIUM 7.9* 7.6*   No results for input(s): LABPT, INR in the last 72 hours.  EXAM General - Patient is Alert and Appropriate Extremity - Neurovascular intact No cellulitis present Compartment soft Dressing - dressing C/D/I Motor Function - intact, moving foot and toes well on exam. Hemovac pulled without difficulty.  Past Medical History:  Diagnosis Date  . Awareness alteration, transient 11/02/2014  . Cellulitis of right hand 01/27/2017  . Colon cancer ([place])   . Essential hypertension 09/23/2012  . Fall 09/16/2016  . Fall from standing 02/25/2017  . GI bleed   . Hyperlipidemia   . Hypertension   . Insomnia 08/13/2016  . Normocytic anemia 01/27/2017  . On amiodarone therapy 08/06/2015  . Orthostatic hypotension 08/06/2015  . OSA (obstructive sleep apnea)   .  PAF (paroxysmal atrial fibrillation) ([place])   . Parkinson disease ([place])   . Prostate cancer ([place])   . SDH (subdural hematoma) ([place]) 09/16/2016  . Status post placement of implantable loop recorder 07/11/2015  . Stroke ([place])   . 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) 07/11/2015   Overview:  Small (Dx with MRA 05-04-07)  . Subdural hematoma ([place]) 09/05/2016  . Syncope 11/02/2014  . TIA (transient ischemic attack)   . Vitamin D deficiency     Assessment/Plan: 1 Day Post-Op Procedure(s) (LRB): TOTAL HIP ARTHROPLASTY ANTERIOR APPROACH (Right) Active Problems:   OSA (obstructive sleep apnea)   Insomnia   Parkinson disease ([facility])   Hyponatremia   Hypokalemia   Essential hypertension   Orthostatic hypotension   PAF (paroxysmal atrial fibrillation) ([facility])   Closed right hip fracture ([facility])   Advance diet Up with therapy D/C IV fluids  Patient and family want him to go home with home health once ready for discharge  DVT Prophylaxis - Lovenox. Convert to Aspirin on discharge Weight Bearing As Tolerated right Leg Hemovac Pulled Begin Therapy  Pilar Plate Shianna Bally

## 2018-11-13 LAB — BASIC METABOLIC PANEL
Anion gap: 9 (ref 5–15)
BUN: 25 mg/dL — ABNORMAL HIGH (ref 8–23)
CO2: 23 mmol/L (ref 22–32)
Calcium: 7.9 mg/dL — ABNORMAL LOW (ref 8.9–10.3)
Chloride: 106 mmol/L (ref 98–111)
Creatinine, Ser: 0.86 mg/dL (ref 0.61–1.24)
GFR calc Af Amer: >60 mL/min (ref >60)
GFR calc non Af Amer: >60 mL/min (ref >60)
Glucose, Bld: 141 mg/dL — ABNORMAL HIGH (ref 70–99)
Potassium: 4.9 mmol/L (ref 3.5–5.1)
Sodium: 138 mmol/L (ref 135–145)

## 2018-11-13 LAB — CBC
HCT: 33.8 % — ABNORMAL LOW (ref 39.0–52.0)
Hemoglobin: 10.6 g/dL — ABNORMAL LOW (ref 13.0–17.0)
MCH: 27 pg (ref 26.0–34.0)
MCHC: 31.4 g/dL (ref 30.0–36.0)
MCV: 86 fL (ref 80.0–100.0)
Platelets: 232 10*3/uL (ref 150–400)
RBC: 3.93 MIL/uL — ABNORMAL LOW (ref 4.22–5.81)
RDW: 14.6 % (ref 11.5–15.5)
WBC: 7.8 10*3/uL (ref 4.0–10.5)
nRBC: 0 % (ref 0.0–0.2)

## 2018-11-13 MED ORDER — METOPROLOL TARTRATE 25 MG PO TABS: 12.5000 mg | ORAL_TABLET | Freq: Two times a day (BID) | ORAL | 0 refills | Status: DC

## 2018-11-13 MED ORDER — ASPIRIN 81 MG PO CHEW: 81.0000 mg | CHEWABLE_TABLET | Freq: Every day | ORAL | 0 refills | Status: DC

## 2018-11-13 MED ORDER — TRAMADOL HCL 50 MG PO TABS: 50.0000 mg | ORAL_TABLET | Freq: Four times a day (QID) | ORAL | 0 refills | Status: DC | PRN

## 2018-11-13 NOTE — Care Management (Signed)
   Patient has a Right hip fracture which requires hip to be positioned in ways not feasible with a                                                                    normal bed. Head must be elevated at least 30 degrees or causes patient pain.                                                                                                  Hip fracture requires frequent and immediate changes in position.

## 2018-11-13 NOTE — Discharge Summary (Signed)
Physician Discharge Summary  Joe Glover:[id number] DOB: Sep 15, 1924 DOA: 11/09/2018  PCP: [name], MD  Admit date: 11/09/2018 Discharge date: 11/13/2018  Admitted From: Home  Disposition:  Home   Recommendations for Outpatient Follow-up and new medication changes:  1. Follow up with Dr. Carolanne Grumbling 2. Patient has been placed on low dose metoprolol for heart rate control. 3. Patient's family prefer to avoid for now SNF due to current pandemic, and the will provide 24 H care.  4. Continue DVT prophylaxis with aspirin.   Home Health: Yes   Equipment/Devices: hospital bed.    Discharge Condition: stable  CODE STATUS: dnr   Diet recommendation: heart healthy   Brief/Interim Summary: 85 year old male who presented with right hip pain. He does have significant past medical history for paroxysmal atrial fibrillation, hypertension, and orthostatic hypotension. Patient sustained a mechanical fall about 5 days prior to hospitalization while walking down the stairs. He developed progressive and worsening right hip pain, with difficulty ambulating. He was seen by his primary care provider, and he was diagnosed with a right hip fracture. On his initial physical examination he was afebrile, his heart rate was between 90 and 100, respiratory rate 16, normal blood pressure. His lungs were clear to auscultation bilaterally, heart S1-S2 present, irregularly irregular, abdomen soft, no lower extremity edema, his right lower extremity was shortened and laterally rotated.  Sodium 129, potassium 3.4, chloride 22, glucose 116, BUN 23, glucose 0.97, white count 7.6, hemoglobin 13.8, hematocrit 41.3, platelets 270.EKG had 104 bpm, atrial fibrillation,left axis deviation, no significant ST segment or T wave changes, positive PACs and PVCs.  SARS COVID-19 negative.  His chest x-ray had left rotation, cardiomegaly and hilar congestion.   Patient was admitted to the hospital working diagnosis of right hip  fracture.   1.  Acute right hip fracture.  Patient was admitted to the medical ward, he received IV fluids, IV analgesics and DVT prophylaxis.  He was seen by orthopedic surgery and underwent a right total hip arthroplasty, anterior approach.  He tolerated well the surgical procedure, he was seen by physical therapy with recommendations to continue therapy at a skilled nursing facility.  Taking into consideration the current pandemic his family has decided to take him home with home health services.  Patient will get home physical therapy, home occupational therapy, home nursing and aide.  Hospital bed has been ordered as well.  2.  Paroxysmal atrial fibrillation with rapid ventricular response.  Patient was placed on remote telemetry monitor, he was noted to have atrial fibrillation with rapid ventricular response, he received metoprolol intravenously and oral, converting into sinus rhythm.  He will continue low-dose metoprolol 12.5 mg twice daily for rate control.  Follow-up heart rate as an outpatient, he had bradycardia as an outpatient.  Not on anticoagulation due to history of subdural hematoma, ambulatory dysfunction and gastrointestinal bleed.  3.  Hyponatremia and hypokalemia.  Patient received isotonic saline and potassium chloride with correction of his electrolytes, his discharge sodium is 138, potassium 4.9, chloride 106, bicarb 23, glucose 141, BUN 25 and creatinine 0.86.  4.  Parkinson's disease.  Continue physical therapy, home health services.  5. Orthostatic hypotension. To resume as outpatient midodrine.    Discharge Diagnoses:  Active Problems:   OSA (obstructive sleep apnea)   Insomnia   Parkinson disease (HCC)   Hyponatremia   Hypokalemia   Essential hypertension   Orthostatic hypotension   PAF (paroxysmal atrial fibrillation) ([place])   Closed right hip fracture ([place])  Discharge Instructions  Discharge Instructions    Call MD / Call 911   Complete by:  As  directed    If you experience chest pain or shortness of breath, CALL 911 and be transported to the hospital emergency room.  If you develope a fever above 101 F, pus (white drainage) or increased drainage or redness at the wound, or calf pain, call your surgeon's office.   Change dressing   Complete by:  As directed    You may change your dressing on Saturday, then change the dressing daily with sterile 4 x 4 inch gauze dressing and paper tape.   Constipation Prevention   Complete by:  As directed    Drink plenty of fluids.  Prune juice may be helpful.  You may use a stool softener, such as Colace (over the counter) 100 mg twice a day.  Use MiraLax (over the counter) for constipation as needed.   Diet - low sodium heart healthy   Complete by:  As directed    Discharge instructions   Complete by:  As directed    Dr. Gaynelle Arabian Total Joint Specialist [facility] [address] [place], [place] [address] [phone]  ANTERIOR APPROACH TOTAL HIP REPLACEMENT POSTOPERATIVE DIRECTIONS   Hip Rehabilitation, Guidelines Following Surgery  The results of a hip operation are greatly improved after range of motion and muscle strengthening exercises. Follow all safety measures which are given to protect your hip. If any of these exercises cause increased pain or swelling in your joint, decrease the amount until you are comfortable again. Then slowly increase the exercises. Call your caregiver if you have problems or questions.   HOME CARE INSTRUCTIONS  Remove items at home which could result in a fall. This includes throw rugs or furniture in walking pathways.  ICE to the affected hip every three hours for 30 minutes at a time and then as needed for pain and swelling.  Continue to use ice on the hip for pain and swelling from surgery. You may notice swelling that will progress down to the foot and ankle.  This is normal after surgery.  Elevate the leg when you are not up walking on  it.   Continue to use the breathing machine which will help keep your temperature down.  It is common for your temperature to cycle up and down following surgery, especially at night when you are not up moving around and exerting yourself.  The breathing machine keeps your lungs expanded and your temperature down.  DIET You may resume your previous home diet once your are discharged from the hospital.  DRESSING / WOUND CARE / SHOWERING You may change your dressing 3-5 days after surgery.  Then change the dressing every day with sterile gauze.  Please use good hand washing techniques before changing the dressing.  Do not use any lotions or creams on the incision until instructed by your surgeon. You may start showering once you are discharged home but do not submerge the incision under water. Just pat the incision dry and apply a dry gauze dressing on daily. Change the surgical dressing daily and reapply a dry dressing each time.  ACTIVITY Walk with your walker as instructed. Use walker as long as suggested by your caregivers. Avoid periods of inactivity such as sitting longer than an hour when not asleep. This helps prevent blood clots.  You may resume a sexual relationship in one month or when given the OK by your doctor.  You may return to work once you are cleared by your doctor.  Do not drive a car for 6 weeks or until released by you surgeon.  Do not drive while taking narcotics.  WEIGHT BEARING Weight bearing as tolerated with assist device (walker, cane, etc) as directed, use it as long as suggested by your surgeon or therapist, typically at least 4-6 weeks.  POSTOPERATIVE CONSTIPATION PROTOCOL Constipation - defined medically as fewer than three stools per week and severe constipation as less than one stool per week.  One of the most common issues patients have following surgery is constipation.  Even if you have a regular bowel pattern at home, your normal regimen is likely to be  disrupted due to multiple reasons following surgery.  Combination of anesthesia, postoperative narcotics, change in appetite and fluid intake all can affect your bowels.  In order to avoid complications following surgery, here are some recommendations in order to help you during your recovery period.  Colace (docusate) - Pick up an over-the-counter form of Colace or another stool softener and take twice a day as long as you are requiring postoperative pain medications.  Take with a full glass of water daily.  If you experience loose stools or diarrhea, hold the colace until you stool forms back up.  If your symptoms do not get better within 1 week or if they get worse, check with your doctor.  Dulcolax (bisacodyl) - Pick up over-the-counter and take as directed by the product packaging as needed to assist with the movement of your bowels.  Take with a full glass of water.  Use this product as needed if not relieved by Colace only.   MiraLax (polyethylene glycol) - Pick up over-the-counter to have on hand.  MiraLax is a solution that will increase the amount of water in your bowels to assist with bowel movements.  Take as directed and can mix with a glass of water, juice, soda, coffee, or tea.  Take if you go more than two days without a movement. Do not use MiraLax more than once per day. Call your doctor if you are still constipated or irregular after using this medication for 7 days in a row.  If you continue to have problems with postoperative constipation, please contact the office for further assistance and recommendations.  If you experience "the worst abdominal pain ever" or develop nausea or vomiting, please contact the office immediatly for further recommendations for treatment.  ITCHING  If you experience itching with your medications, try taking only a single pain pill, or even half a pain pill at a time.  You can also use Benadryl over the counter for itching or also to help with sleep.    TED HOSE STOCKINGS Wear the elastic stockings on both legs for three weeks following surgery during the day but you may remove then at night for sleeping.  MEDICATIONS See your medication summary on the "After Visit Summary" that the nursing staff will review with you prior to discharge.  You may have some home medications which will be placed on hold until you complete the course of blood thinner medication.  It is important for you to complete the blood thinner medication as prescribed by your surgeon.  Continue your approved medications as instructed at time of discharge.  PRECAUTIONS If you experience chest pain or shortness of breath - call 911 immediately for transfer to the hospital emergency department.  If you develop a fever greater that 101 F, purulent drainage  from wound, increased redness or drainage from wound, foul odor from the wound/dressing, or calf pain - CONTACT YOUR SURGEON.                                                   FOLLOW-UP APPOINTMENTS Make sure you keep all of your appointments after your operation with your surgeon and caregivers. You should call the office at the above phone number and make an appointment for approximately two weeks after the date of your surgery or on the date instructed by your surgeon outlined in the "After Visit Summary".  RANGE OF MOTION AND STRENGTHENING EXERCISES  These exercises are designed to help you keep full movement of your hip joint. Follow your caregiver's or physical therapist's instructions. Perform all exercises about fifteen times, three times per day or as directed. Exercise both hips, even if you have had only one joint replacement. These exercises can be done on a training (exercise) mat, on the floor, on a table or on a bed. Use whatever works the best and is most comfortable for you. Use music or television while you are exercising so that the exercises are a pleasant break in your day. This will make your life better with  the exercises acting as a break in routine you can look forward to.  Lying on your back, slowly slide your foot toward your buttocks, raising your knee up off the floor. Then slowly slide your foot back down until your leg is straight again.  Lying on your back spread your legs as far apart as you can without causing discomfort.  Lying on your side, raise your upper leg and foot straight up from the floor as far as is comfortable. Slowly lower the leg and repeat.  Lying on your back, tighten up the muscle in the front of your thigh (quadriceps muscles). You can do this by keeping your leg straight and trying to raise your heel off the floor. This helps strengthen the largest muscle supporting your knee.  Lying on your back, tighten up the muscles of your buttocks both with the legs straight and with the knee bent at a comfortable angle while keeping your heel on the floor.   IF YOU ARE TRANSFERRED TO A SKILLED REHAB FACILITY If the patient is transferred to a skilled rehab facility following release from the hospital, a list of the current medications will be sent to the facility for the patient to continue.  When discharged from the skilled rehab facility, please have the facility set up the patient's [place] prior to being released. Also, the skilled facility will be responsible for providing the patient with their medications at time of release from the facility to include their pain medication, the muscle relaxants, and their blood thinner medication. If the patient is still at the rehab facility at time of the two week follow up appointment, the skilled rehab facility will also need to assist the patient in arranging follow up appointment in our office and any transportation needs.  MAKE SURE YOU:  Understand these instructions.  Get help right away if you are not doing well or get worse.    Pick up stool softner and laxative for home use following surgery while on pain  medications. Do not submerge incision under water. Please use good hand washing techniques while  changing dressing each day. May shower starting three days after surgery. Please use a clean towel to pat the incision dry following showers. Continue to use ice for pain and swelling after surgery. Do not use any lotions or creams on the incision until instructed by your surgeon.   Do not sit on low chairs, stoools or toilet seats, as it may be difficult to get up from low surfaces   Complete by:  As directed    Driving restrictions   Complete by:  As directed    No driving for two weeks   TED hose   Complete by:  As directed    Use stockings (TED hose) for three weeks on both leg(s).  You may remove them at night for sleeping.   Weight bearing as tolerated   Complete by:  As directed      Allergies as of 11/13/2018      Reactions   Anacin [aspirin] Other (See Comments)   GI bleed   Augmentin [amoxicillin-pot Clavulanate] Hives   Hemocyte Plus [hematinic Plus Vit-minerals] Other (See Comments)   Unknown reaction   Penicillins Hives   Has patient had a PCN reaction causing immediate rash, facial/tongue/throat swelling, SOB or lightheadedness with hypotension: No Has patient had a PCN reaction causing severe rash involving mucus membranes or skin necrosis: Yes Has patient had a PCN reaction that required hospitalization: No Has patient had a PCN reaction occurring within the last 10 years: No If all of the above answers are "NO", then may proceed with Cephalosporin use.      Medication List    STOP taking these medications   acetaminophen 500 MG tablet Commonly known as:  TYLENOL     TAKE these medications   aspirin 81 MG chewable tablet Commonly known as:  Aspirin Childrens Chew 1 tablet (81 mg total) by mouth daily. Take one tablet by mouth daily for 6 weeks, then discontinue.   BOTOX IJ Inject as directed every 3 (three) months. Next injection due in December 2019    cyanocobalamin 1000 MCG/ML injection Commonly known as:  (VITAMIN B-12) Inject 1,000 mcg into the muscle every 30 (thirty) days.   EPINEPHrine 0.3 mg/0.3 mL Soaj injection Commonly known as:  EPI-PEN Inject 0.3 mg into the muscle daily as needed (allergic reaction).   ferrous sulfate 325 (65 FE) MG tablet Take 325 mg by mouth 2 (two) times daily with a meal.   ipratropium 0.03 % nasal spray Commonly known as:  ATROVENT Place 1 spray into the nose 2 (two) times daily.   loratadine 10 MG tablet Commonly known as:  CLARITIN Take 10 mg by mouth at bedtime.   magnesium oxide 400 MG tablet Commonly known as:  MAG-OX Take 800 mg by mouth 2 (two) times daily.   Melatonin 5 MG Tabs Take 5 mg by mouth at bedtime as needed.   metoprolol tartrate 25 MG tablet Commonly known as:  LOPRESSOR Take 0.5 tablets (12.5 mg total) by mouth 2 (two) times daily for 30 days.   midodrine 2.5 MG tablet Commonly known as:  PROAMATINE Take 2.5 mg by mouth 2 (two) times a day.   omeprazole 20 MG capsule Commonly known as:  PRILOSEC Take 20 mg by mouth daily.   potassium chloride SA 20 MEQ tablet Commonly known as:  K-DUR Take 10 mEq by mouth daily.   pravastatin 40 MG tablet Commonly known as:  PRAVACHOL Take 40 mg by mouth at bedtime.   PRESCRIPTION MEDICATION Inhale into  the lungs at bedtime. CPAP   PROBIOTIC DAILY PO Take 1 tablet by mouth at bedtime.   psyllium 58.6 % packet Commonly known as:  METAMUCIL Take 1 packet by mouth 2 (two) times daily.   sodium chloride 1 g tablet Take 1 tablet (1 g total) by mouth daily.   temazepam 7.5 MG capsule Commonly known as:  Restoril Take 1 capsule (7.5 mg total) by mouth at bedtime as needed for sleep.   traMADol 50 MG tablet Commonly known as:  ULTRAM Take 1-2 tablets (50-100 mg total) by mouth every 6 (six) hours as needed for moderate pain.   VITAMIN D3 PO Take 5,000 mg by mouth daily.            Durable Medical Equipment   (From admission, onward)         Start     Ordered   11/12/18 1651  For home use only DME Hospital bed  Once    Question Answer Comment  The above medical condition requires: Patient requires the ability to reposition frequently   Bed type Semi-electric      11/12/18 1650   11/12/18 1651  For home use only DME Bedside commode  Once    Question:  Patient needs a bedside commode to treat with the following condition  Answer:  Ambulatory dysfunction   11/12/18 1650           Discharge Care Instructions  (From admission, onward)         Start     Ordered   11/13/18 0000  Weight bearing as tolerated     11/13/18 0827   11/13/18 0000  Change dressing    Comments:  You may change your dressing on Saturday, then change the dressing daily with sterile 4 x 4 inch gauze dressing and paper tape.   11/13/18 0827         Follow-up Information    Gaynelle Arabian, MD. Schedule an appointment as soon as possible for a visit on 11/24/2018.   Specialty:  Orthopedic Surgery Contact information: [address] [place] [address] [phone]          Allergies  Allergen Reactions  . Anacin [Aspirin] Other (See Comments)    GI bleed  . Augmentin [Amoxicillin-Pot Clavulanate] Hives  . Hemocyte Plus [Hematinic Plus Vit-Minerals] Other (See Comments)    Unknown reaction  . Penicillins Hives    Has patient had a PCN reaction causing immediate rash, facial/tongue/throat swelling, SOB or lightheadedness with hypotension: No Has patient had a PCN reaction causing severe rash involving mucus membranes or skin necrosis: Yes Has patient had a PCN reaction that required hospitalization: No Has patient had a PCN reaction occurring within the last 10 years: No If all of the above answers are "NO", then may proceed with Cephalosporin use.    Consultations:  Orthopedic surgery   Cardiology    Procedures/Studies: Dg Chest 1 View  Result Date: 11/09/2018 CLINICAL  DATA:  Preoperative chest radiograph prior to hip surgery. EXAM: CHEST  1 VIEW COMPARISON:  07/26/2018 sec prior studies FINDINGS: Cardiomegaly and pulmonary vascular congestion noted. Mild peribronchial thickening is unchanged. There is no evidence of focal airspace disease, pulmonary edema, suspicious pulmonary nodule/mass, pleural effusion, or pneumothorax. No acute bony abnormalities are identified. IMPRESSION: Cardiomegaly with pulmonary vascular congestion. Electronically Signed   By: Margarette Canada M.D.   On: 11/09/2018 16:04   Dg Pelvis Portable  Result Date: 11/11/2018 CLINICAL DATA:  Post hip  replacement EXAM: PORTABLE PELVIS 1-2 VIEWS COMPARISON:  Portable exam 1535 hours compared intraoperative images of 11/11/2018 FINDINGS: RIGHT hip prosthesis with components in expected position. No fracture or dislocation identified on single AP view. Bones appear mildly demineralized. SI joints and LEFT hip joint space preserved. Surgical clips in pelvis bilaterally. Surgical drain overlies the RIGHT hip region. IMPRESSION: RIGHT hip prosthesis without acute complication. Electronically Signed   By: Lavonia Dana M.D.   On: 11/11/2018 15:43   Dg Knee Complete 4 Views Right  Result Date: 11/09/2018 CLINICAL DATA:  Recent fall with known right hip fracture EXAM: RIGHT KNEE - COMPLETE 4+ VIEW COMPARISON:  07/22/2006 FINDINGS: Degenerative changes are noted about the right knee joint. Some chondrocalcinosis is seen. Small joint effusion is noted. No acute fracture is seen. IMPRESSION: Degenerative change with small joint effusion. No acute bony abnormality is noted. Electronically Signed   By: Inez Catalina M.D.   On: 11/09/2018 16:01   Dg C-arm 1-60 Min-no Report  Result Date: 11/11/2018 Fluoroscopy was utilized by the requesting physician.  No radiographic interpretation.   Dg Hip Operative Unilat W Or W/o Pelvis Right  Result Date: 11/11/2018 CLINICAL DATA:  Hip replacement EXAM: OPERATIVE right HIP (WITH  PELVIS IF PERFORMED) 2 VIEWS TECHNIQUE: Fluoroscopic spot image(s) were submitted for interpretation post-operatively. COMPARISON:  11/09/2018 FINDINGS: Two low resolution intraoperative spot views of the right hip. Total fluoroscopy time was 12 seconds. The images demonstrate a right hip replacement with normal alignment. Numerous surgical clips in the pelvis. IMPRESSION: Intraoperative fluoroscopic assistance provided during right hip surgery Electronically Signed   By: Donavan Foil M.D.   On: 11/11/2018 14:59      Procedures: Total right hip replacement   Subjective: Patient is feeling well, pain is well controlled, no nausea or vomiting, no fever or chills, No nausea or vomiting.   Discharge Exam: Vitals:   11/13/18 0549 11/13/18 1243  BP: (!) 142/92 131/75  Pulse: 65 75  Resp: 18 14  Temp: 97.9 F (36.6 C) 97.9 F (36.6 C)  SpO2: 100% 97%   Vitals:   11/12/18 2105 11/12/18 2225 11/13/18 0549 11/13/18 1243  BP: (!) 146/90  (!) 142/92 131/75  Pulse: 85  65 75  Resp: 18 18 18 14   Temp: 98.4 F (36.9 C)  97.9 F (36.6 C) 97.9 F (36.6 C)  TempSrc: Oral  Oral Axillary  SpO2: 100%  100% 97%  Weight:      Height:        General: deconditioned Neurology: Awake and alert, non focal  E ENT: mild pallor, no icterus, oral mucosa moist Cardiovascular: No JVD. S1-S2 present, rhythmic, no gallops, rubs, or murmurs. No lower extremity edema. Pulmonary: positive breath sounds bilaterally, adequate air movement, no wheezing, rhonchi or rales. Gastrointestinal. Abdomen soft with  no organomegaly, non tender, no rebound or guarding Skin. No rashes Musculoskeletal: no joint deformities   The results of significant diagnostics from this hospitalization (including imaging, microbiology, ancillary and laboratory) are listed below for reference.     Microbiology: Recent Results (from the past 240 hour(s))  SARS Coronavirus 2 (CEPHEID - Performed in [facility] hospital lab), Hosp  Order     Status: None   Collection Time: 11/09/18  3:25 PM  Result Value Ref Range Status   SARS Coronavirus 2 NEGATIVE NEGATIVE Final    Comment: (NOTE) If result is NEGATIVE SARS-CoV-2 target nucleic acids are NOT DETECTED. The SARS-CoV-2 RNA is generally detectable in upper and lower  respiratory  specimens during the acute phase of infection. The lowest  concentration of SARS-CoV-2 viral copies this assay can detect is 250  copies / mL. A negative result does not preclude SARS-CoV-2 infection  and should not be used as the sole basis for treatment or other  patient management decisions.  A negative result may occur with  improper specimen collection / handling, submission of specimen other  than nasopharyngeal swab, presence of viral mutation(s) within the  areas targeted by this assay, and inadequate number of viral copies  (<250 copies / mL). A negative result must be combined with clinical  observations, patient history, and epidemiological information. If result is POSITIVE SARS-CoV-2 target nucleic acids are DETECTED. The SARS-CoV-2 RNA is generally detectable in upper and lower  respiratory specimens dur ing the acute phase of infection.  Positive  results are indicative of active infection with SARS-CoV-2.  Clinical  correlation with patient history and other diagnostic information is  necessary to determine patient infection status.  Positive results do  not rule out bacterial infection or co-infection with other viruses. If result is PRESUMPTIVE POSTIVE SARS-CoV-2 nucleic acids MAY BE PRESENT.   A presumptive positive result was obtained on the submitted specimen  and confirmed on repeat testing.  While 2019 novel coronavirus  (SARS-CoV-2) nucleic acids may be present in the submitted sample  additional confirmatory testing may be necessary for epidemiological  and / or clinical management purposes  to differentiate between  SARS-CoV-2 and other Sarbecovirus currently  known to infect humans.  If clinically indicated additional testing with an alternate test  methodology [phone]) is advised. The SARS-CoV-2 RNA is generally  detectable in upper and lower respiratory sp ecimens during the acute  phase of infection. The expected result is Negative. Fact Sheet for Patients:  StrictlyIdeas.no Fact Sheet for Healthcare Providers: BankingDealers.co.za This test is not yet approved or cleared by the [place] FDA and has been authorized for detection and/or diagnosis of SARS-CoV-2 by FDA under an Emergency Use Authorization (EUA).  This EUA will remain in effect (meaning this test can be used) for the duration of the COVID-19 declaration under Section 564(b)(1) of the Act, 21 U.S.C. section 360bbb-3(b)(1), unless the authorization is terminated or revoked sooner. Performed at [facility], [place] [address], [place], [place] [address]      Labs: BNP (last 3 results) Recent Labs    05/31/18 1358  BNP 956.2*   Basic Metabolic Panel: Recent Labs  Lab 11/09/18 1525 11/10/18 0131 11/11/18 0457 11/12/18 0457 11/13/18 0428  NA 129* 128* 133* 134* 138  K 3.4* 3.5 4.4 4.2 4.9  CL 95* 100 102 103 106  CO2 22 19* 21* 20* 23  GLUCOSE 116* 104* 89 156* 141*  BUN 23 22 21 23  25*  CREATININE 0.97 0.89 1.00 0.89 0.86  CALCIUM 8.4* 7.9* 7.9* 7.6* 7.9*  MG  --  1.7  --   --   --    Liver Function Tests: Recent Labs  Lab 11/09/18 1525  AST 42*  ALT 47*  ALKPHOS 71  BILITOT 1.2  PROT 7.6  ALBUMIN 3.4*   No results for input(s): LIPASE, AMYLASE in the last 168 hours. No results for input(s): AMMONIA in the last 168 hours. CBC: Recent Labs  Lab 11/09/18 1525 11/11/18 0457 11/13/18 0428  WBC 7.6 5.8 7.8  NEUTROABS 5.8  --   --   HGB 13.8 12.2* 10.6*  HCT 41.3 37.8* 33.8*  MCV 84.5 85.9 86.0  PLT  207 204 232   Cardiac Enzymes: No results for input(s): CKTOTAL, CKMB,  CKMBINDEX, TROPONINI in the last 168 hours. BNP: Invalid input(s): POCBNP CBG: Recent Labs  Lab 11/11/18 1627  GLUCAP 98   D-Dimer No results for input(s): DDIMER in the last 72 hours. Hgb A1c No results for input(s): HGBA1C in the last 72 hours. Lipid Profile No results for input(s): CHOL, HDL, LDLCALC, TRIG, CHOLHDL, LDLDIRECT in the last 72 hours. Thyroid function studies No results for input(s): TSH, T4TOTAL, T3FREE, THYROIDAB in the last 72 hours.  Invalid input(s): FREET3 Anemia work up No results for input(s): VITAMINB12, FOLATE, FERRITIN, TIBC, IRON, RETICCTPCT in the last 72 hours. Urinalysis    Component Value Date/Time   COLORURINE YELLOW 01/27/2017 0021   APPEARANCEUR CLEAR 01/27/2017 0021   LABSPEC 1.006 01/27/2017 0021   PHURINE 6.0 01/27/2017 0021   GLUCOSEU NEGATIVE 01/27/2017 0021   HGBUR NEGATIVE 01/27/2017 0021   BILIRUBINUR NEGATIVE 01/27/2017 0021   KETONESUR NEGATIVE 01/27/2017 0021   PROTEINUR NEGATIVE 01/27/2017 0021   UROBILINOGEN 0.2 10/10/2014 1146   NITRITE NEGATIVE 01/27/2017 0021   LEUKOCYTESUR NEGATIVE 01/27/2017 0021   Sepsis Labs Invalid input(s): PROCALCITONIN,  WBC,  LACTICIDVEN Microbiology Recent Results (from the past 240 hour(s))  SARS Coronavirus 2 (CEPHEID - Performed in [facility] hospital lab), Hosp Order     Status: None   Collection Time: 11/09/18  3:25 PM  Result Value Ref Range Status   SARS Coronavirus 2 NEGATIVE NEGATIVE Final    Comment: (NOTE) If result is NEGATIVE SARS-CoV-2 target nucleic acids are NOT DETECTED. The SARS-CoV-2 RNA is generally detectable in upper and lower  respiratory specimens during the acute phase of infection. The lowest  concentration of SARS-CoV-2 viral copies this assay can detect is 250  copies / mL. A negative result does not preclude SARS-CoV-2 infection  and should not be used as the sole basis for treatment or other  patient management decisions.  A negative result may occur with   improper specimen collection / handling, submission of specimen other  than nasopharyngeal swab, presence of viral mutation(s) within the  areas targeted by this assay, and inadequate number of viral copies  (<250 copies / mL). A negative result must be combined with clinical  observations, patient history, and epidemiological information. If result is POSITIVE SARS-CoV-2 target nucleic acids are DETECTED. The SARS-CoV-2 RNA is generally detectable in upper and lower  respiratory specimens dur ing the acute phase of infection.  Positive  results are indicative of active infection with SARS-CoV-2.  Clinical  correlation with patient history and other diagnostic information is  necessary to determine patient infection status.  Positive results do  not rule out bacterial infection or co-infection with other viruses. If result is PRESUMPTIVE POSTIVE SARS-CoV-2 nucleic acids MAY BE PRESENT.   A presumptive positive result was obtained on the submitted specimen  and confirmed on repeat testing.  While 2019 novel coronavirus  (SARS-CoV-2) nucleic acids may be present in the submitted sample  additional confirmatory testing may be necessary for epidemiological  and / or clinical management purposes  to differentiate between  SARS-CoV-2 and other Sarbecovirus currently known to infect humans.  If clinically indicated additional testing with an alternate test  methodology [phone]) is advised. The SARS-CoV-2 RNA is generally  detectable in upper and lower respiratory sp ecimens during the acute  phase of infection. The expected result is Negative. Fact Sheet for Patients:  StrictlyIdeas.no Fact Sheet for Healthcare Providers: BankingDealers.co.za This test is  not yet approved or cleared by the [place] and has been authorized for detection and/or diagnosis of SARS-CoV-2 by FDA under an Emergency Use Authorization (EUA).  This EUA will  remain in effect (meaning this test can be used) for the duration of the COVID-19 declaration under Section 564(b)(1) of the Act, 21 U.S.C. section 360bbb-3(b)(1), unless the authorization is terminated or revoked sooner. Performed at [facility], [place] [address] [place], [place] [address]      Time coordinating discharge: 45 minutes  SIGNED:   Tawni Millers, MD  [facility] 11/13/2018, 2:57 PM

## 2018-11-13 NOTE — Progress Notes (Signed)
Entered room to place CPAP device and patient refuses.  RN present in room at time of refusal.

## 2018-11-13 NOTE — Progress Notes (Signed)
Physical Therapy Treatment Patient Details Name: Joe Glover MRN: [id number] DOB: 1925-05-04 Today's Date: 11/13/2018    History of Present Illness Pt s/p fall with R hip fx and now s/p R THR by anterior direct approach.  Pt with hx of CVA, SDH, PAF, Colon CA and falls    PT Comments    POD # 2 am session Assisted OOB to amb.  General bed mobility comments: cues for sequence and use of L LE to self assist.  Shaky.General transfer comment: cues for transition position, use of UEs to self assist and LE management.   General Gait Details: cues for sequence, posture, position from RW, and safe management of RW   shaky.  Unsteady.  HIGH FALL RISK  Follow Up Recommendations  SNF  24/7 home care   Equipment Recommendations  None recommended by PT    Recommendations for Other Services       Precautions / Restrictions Precautions Precautions: Fall Precaution Comments: HOH Restrictions Weight Bearing Restrictions: No RLE Weight Bearing: Weight bearing as tolerated    Mobility  Bed Mobility Overal bed mobility: Needs Assistance Bed Mobility: Sit to Supine;Supine to Sit     Supine to sit: Min assist Sit to supine: Mod assist   General bed mobility comments: cues for sequence and use of L LE to self assist.  Shaky.  Transfers Overall transfer level: Needs assistance Equipment used: Rolling walker (2 wheeled) Transfers: Sit to/from Stand Sit to Stand: Min assist;Mod assist         General transfer comment: cues for transition position, use of UEs to self assist and LE management  Ambulation/Gait Ambulation/Gait assistance: Mod assist;Min assist Gait Distance (Feet): 14 Feet Assistive device: Rolling walker (2 wheeled) Gait Pattern/deviations: Step-to pattern;Decreased step length - right;Decreased step length - left;Decreased stance time - right;Shuffle;Trunk flexed Gait velocity: decreased    General Gait Details: cues for sequence, posture, position from RW, and  safe management of RW   shaky.  Unsteady.  HIGH FALL RISK   Stairs             Wheelchair Mobility    Modified Rankin (Stroke Patients Only)       Balance                                            Cognition Arousal/Alertness: Awake/alert Behavior During Therapy: WFL for tasks assessed/performed Overall Cognitive Status: Within Functional Limits for tasks assessed                                 General Comments: speech difficult to understand at times but follows commands/directions      Exercises      General Comments        Pertinent Vitals/Pain Pain Assessment: Faces Faces Pain Scale: Hurts little more Pain Location: R hip Pain Descriptors / Indicators: Grimacing Pain Intervention(s): Monitored during session;Repositioned    Home Living                      Prior Function            PT Goals (current goals can now be found in the care plan section) Progress towards PT goals: Progressing toward goals    Frequency    Min 6X/week      PT  Plan Current plan remains appropriate    Co-evaluation              AM-PAC PT "6 Clicks" Mobility   Outcome Measure  Help needed turning from your back to your side while in a flat bed without using bedrails?: A Little Help needed moving from lying on your back to sitting on the side of a flat bed without using bedrails?: A Lot Help needed moving to and from a bed to a chair (including a wheelchair)?: A Lot Help needed standing up from a chair using your arms (e.g., wheelchair or bedside chair)?: A Lot Help needed to walk in hospital room?: A Lot Help needed climbing 3-5 steps with a railing? : Total 6 Click Score: 12    End of Session Equipment Utilized During Treatment: Gait belt Activity Tolerance: Patient tolerated treatment well Patient left: with call bell/phone within reach;in chair Nurse Communication: Mobility status PT Visit Diagnosis: History  of falling (Z91.81);Difficulty in walking, not elsewhere classified (R26.2)     Time: 2423-5361 PT Time Calculation (min) (ACUTE ONLY): 34 min  Charges:  $Gait Training: 8-22 mins $Therapeutic Activity: 8-22 mins                     Rica Koyanagi  PTA Acute  Rehabilitation Services Pager      [phone] Office      [phone]

## 2018-11-13 NOTE — Progress Notes (Cosign Needed)
    Durable Medical Equipment  (From admission, onward)         Start     Ordered   11/12/18 1651  For home use only DME Hospital bed  Once    Question Answer Comment  The above medical condition requires: Patient requires the ability to reposition frequently   Bed type Semi-electric      11/12/18 1650   11/12/18 1651  For home use only DME Bedside commode  Once    Question:  Patient needs a bedside commode to treat with the following condition  Answer:  Ambulatory dysfunction   11/12/18 1650

## 2018-11-13 NOTE — Progress Notes (Signed)
Physical Therapy Treatment Patient Details Name: Joe Glover MRN: [id number] DOB: 10-08-1924 Today's Date: 11/13/2018    History of Present Illness Pt s/p fall with R hip fx and now s/p R THR by anterior direct approach.  Pt with hx of CVA, SDH, PAF, Colon CA and falls    PT Comments    POD # 2 pm session Pt back in bed incont urine/BM.  Assisted OOB to amb to bathroom.  General Gait Details: cues for sequence, posture, position from RW, and safe management of RW   shaky.  Unsteady.  HIGH FALL RISK Assisted back to bed and positioned to comfort. RN stated pt's spouse plans for pt to return home vs SNF. Pt will need PTAR transport Spouse requesting a RW, 3:1 and a hospital bed. MD and RN aware.  MD also ordering HHPT/HHOT/RN. Pt plans to D/C tomorrow after hospital bed is delivered.   Follow Up Recommendations  HH PT  PTAR transport   Equipment Recommendations  RW 3:1 Hospital Bed  Recommendations for Other Services       Precautions / Restrictions Precautions Precautions: Fall Precaution Comments: HOH Restrictions Weight Bearing Restrictions: No RLE Weight Bearing: Weight bearing as tolerated    Mobility  Bed Mobility Overal bed mobility: Needs Assistance Bed Mobility: Sit to Supine;Supine to Sit     Supine to sit: Min assist Sit to supine: Mod assist   General bed mobility comments: cues for sequence and use of L LE to self assist.  Shaky.  Transfers Overall transfer level: Needs assistance Equipment used: Rolling walker (2 wheeled) Transfers: Sit to/from Stand Sit to Stand: Min assist;Mod assist         General transfer comment: cues for transition position, use of UEs to self assist and LE management  Ambulation/Gait Ambulation/Gait assistance: Mod assist;Min assist Gait Distance (Feet): (to and from bathroom) Assistive device: Rolling walker (2 wheeled) Gait Pattern/deviations: Step-to pattern;Decreased step length - right;Decreased step length  - left;Decreased stance time - right;Shuffle;Trunk flexed Gait velocity: decreased    General Gait Details: cues for sequence, posture, position from RW, and safe management of RW   shaky.  Unsteady.  HIGH FALL RISK   Stairs             Wheelchair Mobility    Modified Rankin (Stroke Patients Only)       Balance                                            Cognition Arousal/Alertness: Awake/alert Behavior During Therapy: WFL for tasks assessed/performed Overall Cognitive Status: Within Functional Limits for tasks assessed                                 General Comments: speech difficult to understand at times but follows commands/directions      Exercises      General Comments        Pertinent Vitals/Pain Pain Assessment: Faces Faces Pain Scale: Hurts little more Pain Location: R hip Pain Descriptors / Indicators: Grimacing Pain Intervention(s): Monitored during session;Repositioned    Home Living                      Prior Function            PT Goals (current goals can now  be found in the care plan section) Progress towards PT goals: Progressing toward goals    Frequency    Min 6X/week      PT Plan Current plan remains appropriate    Co-evaluation              AM-PAC PT "6 Clicks" Mobility   Outcome Measure  Help needed turning from your back to your side while in a flat bed without using bedrails?: A Little Help needed moving from lying on your back to sitting on the side of a flat bed without using bedrails?: A Lot Help needed moving to and from a bed to a chair (including a wheelchair)?: A Lot Help needed standing up from a chair using your arms (e.g., wheelchair or bedside chair)?: A Lot Help needed to walk in hospital room?: A Lot Help needed climbing 3-5 steps with a railing? : Total 6 Click Score: 12    End of Session Equipment Utilized During Treatment: Gait belt Activity  Tolerance: Patient tolerated treatment well Patient left: with call bell/phone within reach;in chair Nurse Communication: Mobility status PT Visit Diagnosis: History of falling (Z91.81);Difficulty in walking, not elsewhere classified (R26.2)     Time: 1415-1440 PT Time Calculation (min) (ACUTE ONLY): 25 min  Charges:  $Gait Training: 8-22 mins $Therapeutic Activity: 8-22 mins                    Rica Koyanagi  PTA Acute  Rehabilitation Services Pager      [phone] Office      [phone]

## 2018-11-13 NOTE — Progress Notes (Addendum)
Subjective: 2 Days Post-Op Procedure(s) (LRB): TOTAL HIP ARTHROPLASTY ANTERIOR APPROACH (Right) Patient reports pain as mild.   Patient seen in rounds for Dr. Wynelle Link. Patient is well, and has had no acute complaints or problems other than pain in the right hip. No acute events overnight. Foley catheter in place, positive flatus.  We will continue therapy today.   Objective: Vital signs in last 24 hours: Temp:  [97.9 F (36.6 C)-98.4 F (36.9 C)] 97.9 F (36.6 C) (05/08 0549) Pulse Rate:  [65-85] 65 (05/08 0549) Resp:  [18] 18 (05/08 0549) BP: (123-146)/(75-92) 142/92 (05/08 0549) SpO2:  [99 %-100 %] 100 % (05/08 0549)  Intake/Output from previous day:  Intake/Output Summary (Last 24 hours) at 11/13/2018 0809 Last data filed at 11/13/2018 0600 Gross per 24 hour  Intake 0 ml  Output 500 ml  Net -500 ml     Intake/Output this shift: No intake/output data recorded.  Labs: Recent Labs    11/11/18 0457 11/13/18 0428  HGB 12.2* 10.6*   Recent Labs    11/11/18 0457 11/13/18 0428  WBC 5.8 7.8  RBC 4.40 3.93*  HCT 37.8* 33.8*  PLT 204 232   Recent Labs    11/12/18 0457 11/13/18 0428  NA 134* 138  K 4.2 4.9  CL 103 106  CO2 20* 23  BUN 23 25*  CREATININE 0.89 0.86  GLUCOSE 156* 141*  CALCIUM 7.6* 7.9*   No results for input(s): LABPT, INR in the last 72 hours.  Exam: General - Patient is Alert and Appropriate Extremity - Neurologically intact Sensation intact distally Intact pulses distally Dorsiflexion/Plantar flexion intact Dressing - dressing C/D/I Motor Function - intact, moving foot and toes well on exam.   Past Medical History:  Diagnosis Date  . Awareness alteration, transient 11/02/2014  . Cellulitis of right hand 01/27/2017  . Colon cancer ([place])   . Essential hypertension 09/23/2012  . Fall 09/16/2016  . Fall from standing 02/25/2017  . GI bleed   . Hyperlipidemia   . Hypertension   . Insomnia 08/13/2016  . Normocytic anemia 01/27/2017  . On  amiodarone therapy 08/06/2015  . Orthostatic hypotension 08/06/2015  . OSA (obstructive sleep apnea)   . PAF (paroxysmal atrial fibrillation) ([place])   . Parkinson disease ([place])   . Prostate cancer ([place])   . SDH (subdural hematoma) ([place]) 09/16/2016  . Status post placement of implantable loop recorder 07/11/2015  . Stroke ([place])   . 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 Subdural hematoma ([place]) 09/05/2016  . Syncope 11/02/2014  . TIA (transient ischemic attack)   . Vitamin D deficiency     Assessment/Plan: 2 Days Post-Op Procedure(s) (LRB): TOTAL HIP ARTHROPLASTY ANTERIOR APPROACH (Right) Active Problems:   OSA (obstructive sleep apnea)   Insomnia   Parkinson disease (H[facility])   Hyponatremia   Hypokalemia   Essential hypertension   Orthostatic hypotension   PAF (paroxysmal atrial fibrillation) (HCC)   Closed right hip fracture (HCC)  Estimated body mass index is 20.96 kg/m as calculated from the following:   Height as of this encounter: 6' (1.829 m).   Weight as of this encounter: 70.1 kg. Advance diet Up with therapy D/C IV fluids  DVT Prophylaxis - Lovenox. Convert to Aspirin 81 mg daily on discharge. Weight bearing as tolerated to the RLE.   Plan is to go Home after hospital stay with home health per patient and family  request. He will continue working with physical therapy to ensure safe discharge home. Depending on how he progresses with PT, may consider SNF placement for safety. He will be ready to discharge from an Orthopedic standpoint when he is meeting goals with therapy. Continue daily dressing change with gauze and paper tape. Follow up in the office in 2 weeks.   Griffith Citron, PA-C Orthopedic Surgery 11/13/2018, 8:09 AM

## 2018-11-13 NOTE — Progress Notes (Signed)
Spoke with pt's wife to arrange home health and DME- Hospital bed and [facility] ordered to be delivered to pt's home prior to him arriving home at [place]. ([employer]) Pt's wife and daughter provided with Medicare list of [place] companies (via phone) which they are reviewing and will call back to advise preference. TOC team will refer when selection made.  Sharren Bridge, MSW, LCSW Clinical Social Work 11/13/2018 [phone]

## 2018-11-14 LAB — CBC
HCT: 35.5 % — ABNORMAL LOW (ref 39.0–52.0)
Hemoglobin: 11.1 g/dL — ABNORMAL LOW (ref 13.0–17.0)
MCH: 27 pg (ref 26.0–34.0)
MCHC: 31.3 g/dL (ref 30.0–36.0)
MCV: 86.4 fL (ref 80.0–100.0)
Platelets: 271 10*3/uL (ref 150–400)
RBC: 4.11 MIL/uL — ABNORMAL LOW (ref 4.22–5.81)
RDW: 14.6 % (ref 11.5–15.5)
WBC: 5.6 10*3/uL (ref 4.0–10.5)
nRBC: 0 % (ref 0.0–0.2)

## 2018-11-14 NOTE — Progress Notes (Signed)
Patient had mid agitation this am, but he was easy to be redirected, no nausea or vomiting and did not complain of significant pain. His agitation improve after talking to his wife over the phone.   His vitals signs have been stable. On my exam he is interactive and responding to questions appropriately. Lungs are clear to auscultation, heart S1-S2 regular, abdomen soft and no lower extremity edema.   Will continue with discharge as planned.

## 2018-11-14 NOTE — Progress Notes (Signed)
Pt's BP 170/98 manually this morning. HR 116. Pt has an order for PRN metoprolol if HR >130. NP on call, [place], paged regarding increased BP and asked to revise order so that metoprolol can be administered. Order received to keep metoprolol admin instructions as they are. Pt seems more confused this morning and is tense and fidgity. Pt reoriented. Will continue to monitor.

## 2018-11-14 NOTE — Progress Notes (Signed)
   Subjective: 3 Days Post-Op Procedure(s) (LRB): TOTAL HIP ARTHROPLASTY ANTERIOR APPROACH (Right)  Pt slightly confused this morning Denies any pain to right hip Working with d/c planning Patient reports pain as .mild  Objective:   VITALS:   Vitals:   11/14/18 0509 11/14/18 0516  BP: (!) 166/105 (!) 170/98  Pulse: (!) 116   Resp:    Temp: 97.8 F (36.6 C)   SpO2:      Right hip incisions healing anteriorly nv intact distally No rashes or edema distally No drainage  LABS Recent Labs    11/13/18 0428 11/14/18 0439  HGB 10.6* 11.1*  HCT 33.8* 35.5*  WBC 7.8 5.6  PLT 232 271    Recent Labs    11/12/18 0457 11/13/18 0428  NA 134* 138  K 4.2 4.9  BUN 23 25*  CREATININE 0.89 0.86  GLUCOSE 156* 141*     Assessment/Plan: 3 Days Post-Op Procedure(s) (LRB): TOTAL HIP ARTHROPLASTY ANTERIOR APPROACH (Right) Continue PT/OT F/u in the office 2 weeks after discharge Will continue to monitor his progress   Merla Riches PA-C, [place] [facility] [employer] [address] [place], [place], [place] [address] Phone: [phone] [email or web address] Facebook  [employer]

## 2018-11-14 NOTE — Plan of Care (Signed)
Discharge instructions reviewed with wife [place] Carbo over the phone, questions answered, verbalized understanding.   Patient transported home by [facility].

## 2018-11-14 NOTE — TOC Initial Note (Addendum)
Transition of Care [facility]) - Initial/Assessment Note    Patient Details  Name: Joe Glover MRN: [id number] Date of Birth: [birth year]  Transition of Care [facility]) CM/SW Contact:    Erenest Rasher, RN Phone Number: 11/14/2018, 9:55 AM  Clinical Narrative:                 Previous CM offered wife choice. Wife is requesting [facility]. Contacted [place] with new referral. Wife, Pearl declined SNF. Wife is requested PTAR transport home. Wife received hospital bed and 3n1 bc. States [employer] to bring mattress this am.   Expected Discharge Plan: [place] Barriers to Discharge: Continued Medical Work up   Patient Goals and CMS Choice Patient states their goals for this hospitalization and ongoing recovery are:: be able to walk and get around [facility] Medicare.gov Compare Post Acute Care list provided to:: Patient Represenative (must comment)(Pearl Droz) Choice offered to / list presented to : Spouse  Expected Discharge Plan and Services Expected Discharge Plan: [place] In-house Referral: NA Discharge Planning Services: CM Consult Post Acute Care Choice: Home Health, Durable Medical Equipment Living arrangements for the past 2 months: Single Family Home Expected Discharge Date: 11/13/18               DME Arranged: Hospital bed, 3-N-1 DME Agency: [employer] Date DME Agency Contacted: 11/13/18 Time DME Agency Contacted: [id number] Representative spoke with at DME Agency: [place] Arranged: RN, PT, OT, Nurse's Aide [place] Agency: [facility] (now [facility]) Date [place]: 11/14/18 Time [place]: [phone] Representative spoke with at [place]: Graciella Freer  Prior Living Arrangements/Services Living arrangements for the past 2 months: [place] with:: Spouse Patient language and need for interpreter reviewed:: Yes Do you feel safe going back to the place where you live?: Yes      Need for Family  Participation in Patient Care: Yes (Comment) Care giver support system in place?: Yes (comment) Current home services: DME(rolling walker, wheelchair) Criminal Activity/Legal Involvement Pertinent to Current Situation/Hospitalization: No - Comment as needed  Activities of Daily Living Home Assistive Devices/Equipment: CPAP, Cane (specify quad or straight), Eyeglasses, Walker (specify type), Bedside commode/3-in-1, Grab bars in shower, Grab bars around toilet, Hand-held shower hose, Shower chair with back(front wheeled walker, single point cane) ADL Screening (condition at time of admission) Patient's cognitive ability adequate to safely complete daily activities?: Yes Is the patient deaf or have difficulty hearing?: No Does the patient have difficulty seeing, even when wearing glasses/contacts?: No Does the patient have difficulty concentrating, remembering, or making decisions?: No Patient able to express need for assistance with ADLs?: Yes Does the patient have difficulty dressing or bathing?: Yes Independently performs ADLs?: No Communication: Independent Dressing (OT): Needs assistance Is this a change from baseline?: Change from baseline, expected to last >3 days Grooming: Needs assistance Is this a change from baseline?: Change from baseline, expected to last >3 days Feeding: Needs assistance Is this a change from baseline?: Change from baseline, expected to last >3 days Bathing: Needs assistance Is this a change from baseline?: Change from baseline, expected to last >3 days Toileting: Dependent Is this a change from baseline?: Change from baseline, expected to last >3days In/Out Bed: Dependent Is this a change from baseline?: Change from baseline, expected to last >3 days Walks in Home: Dependent Is this a change from baseline?: Change from baseline, expected to last >3 days Does the patient have difficulty  walking or climbing stairs?: Yes Weakness of Legs: Both(fractured right  hip) Weakness of Arms/Hands: Both  Permission Sought/Granted Permission sought to share information with : Case Manager, PCP, Other (comment) Permission granted to share information with : Yes, Verbal Permission Granted  Share Information with NAME: [place] Whedbee  Permission granted to share info w AGENCY: [facility], [place] granted to share info w Relationship: wife  Permission granted to share info w Contact Information: [phone]  Emotional Assessment       Orientation: : Oriented to Self, Oriented to Place, Oriented to  Time, Oriented to Situation   Psych Involvement: No (comment)  Admission diagnosis:  Hyponatremia [E87.1] Closed fracture of right hip, initial encounter [facility]) [S72.001A] Patient Active Problem List   Diagnosis Date Noted  . Closed right hip fracture ([place]) 11/09/2018  . Sick sinus syndrome ([place]) 09/16/2018  . Bradycardia 05/14/2018  . Fall from standing 02/25/2017  . Parkinson disease ([place]) 01/27/2017  . Hyponatremia 01/27/2017  . Hypokalemia 01/27/2017  . Cellulitis of right hand 01/27/2017  . Normocytic anemia 01/27/2017  . Fall 09/16/2016  . SDH (subdural hematoma) ([place]) 09/16/2016  . Subdural hematoma ([place]) 09/05/2016  . Insomnia 08/13/2016  . On amiodarone therapy 08/06/2015  . Orthostatic hypotension 08/06/2015  . PAF (paroxysmal atrial fibrillation) ([place]) 08/06/2015  . Status post placement of implantable loop recorder 07/11/2015  . 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 Syncope 11/02/2014  . Awareness alteration, transient 11/02/2014  . OSA (obstructive sleep apnea) 05/31/2014  . Essential hypertension 09/23/2012  . Prostate cancer ([place]) 09/23/2012   PCP:  [name], MD Pharmacy:   [facility] [phone] - [place], [place] [address] [place] [address] [place] [address] Phone: [phone] Fax:  [phone]  [id number] - [place], [address] AT [place] [address] [place] [address] Phone: [phone] Fax: [phone]     Social Determinants of Health (SDOH) Interventions    Readmission Risk Interventions No flowsheet data found.

## 2018-11-14 NOTE — Progress Notes (Signed)
Physical Therapy Treatment Patient Details Name: Joe Glover MRN: [id number] DOB: February 11, 1925 Today's Date: 11/14/2018    History of Present Illness Pt s/p fall with R hip fx and now s/p R THR by anterior direct approach.  Pt with hx of CVA, SDH, PAF, Colon CA and falls    PT Comments    Pt largely cooperative, following most cues and progressing with all mobility tasks but continues impulsive, unsteady on feet and at high risk for falls.  Pt's family plans pt to dc home this date by PTAR once home equipment is in place.   Follow Up Recommendations  SNF     Equipment Recommendations  None recommended by PT    Recommendations for Other Services OT consult     Precautions / Restrictions Precautions Precautions: Fall Precaution Comments: HOH Restrictions Weight Bearing Restrictions: No RLE Weight Bearing: Weight bearing as tolerated    Mobility  Bed Mobility Overal bed mobility: Needs Assistance Bed Mobility: Supine to Sit     Supine to sit: Min assist     General bed mobility comments: INcreased time with cues for sequence and use of L LE to self assist.   Transfers Overall transfer level: Needs assistance Equipment used: Rolling walker (2 wheeled) Transfers: Sit to/from [facility] Sit to Stand: Min assist Stand pivot transfers: Min assist;Mod assist       General transfer comment: cues for safety awareness, transition position, use of UEs to self assist and LE management  Ambulation/Gait Ambulation/Gait assistance: Min assist Gait Distance (Feet): 33 Feet Assistive device: Rolling walker (2 wheeled) Gait Pattern/deviations: Decreased step length - right;Decreased step length - left;Decreased stance time - right;Shuffle;Trunk flexed;Step-to pattern;Step-through pattern Gait velocity: decreased    General Gait Details: cues for sequence, posture, position from RW, and safe management of RW   shaky.  Unsteady.  HIGH FALL RISK   Stairs              Wheelchair Mobility    Modified Rankin (Stroke Patients Only)       Balance Overall balance assessment: Needs assistance Sitting-balance support: Feet supported;No upper extremity supported Sitting balance-Leahy Scale: Good     Standing balance support: Bilateral upper extremity supported Standing balance-Leahy Scale: Poor                              Cognition Arousal/Alertness: Awake/alert Behavior During Therapy: WFL for tasks assessed/performed Overall Cognitive Status: Within Functional Limits for tasks assessed                                 General Comments: speech difficult to understand at times but follows commands/directions      Exercises Total Joint Exercises Ankle Circles/Pumps: AROM;Both;15 reps;Supine Quad Sets: AROM;Both;10 reps;Supine Heel Slides: AAROM;Right;Supine;20 reps Hip ABduction/ADduction: AAROM;Right;Supine;15 reps Long Arc Quad: AROM;Right;10 reps;Seated    General Comments        Pertinent Vitals/Pain Pain Assessment: No/denies pain    Home Living                      Prior Function            PT Goals (current goals can now be found in the care plan section) Acute Rehab PT Goals Patient Stated Goal: Home PT Goal Formulation: With patient Time For Goal Achievement: 11/26/18 Potential to Achieve Goals: Good Progress towards  PT goals: Progressing toward goals    Frequency    Min 6X/week      PT Plan Current plan remains appropriate    Co-evaluation              AM-PAC PT "6 Clicks" Mobility   Outcome Measure  Help needed turning from your back to your side while in a flat bed without using bedrails?: A Little Help needed moving from lying on your back to sitting on the side of a flat bed without using bedrails?: A Little Help needed moving to and from a bed to a chair (including a wheelchair)?: A Little Help needed standing up from a chair using your arms  (e.g., wheelchair or bedside chair)?: A Little Help needed to walk in hospital room?: A Little Help needed climbing 3-5 steps with a railing? : A Lot 6 Click Score: 17    End of Session Equipment Utilized During Treatment: Gait belt Activity Tolerance: Patient tolerated treatment well Patient left: with call bell/phone within reach;in chair Nurse Communication: Mobility status PT Visit Diagnosis: History of falling (Z91.81);Difficulty in walking, not elsewhere classified (R26.2)     Time: 6659-9357 PT Time Calculation (min) (ACUTE ONLY): 39 min  Charges:  $Gait Training: 8-22 mins $Therapeutic Exercise: 8-22 mins $Therapeutic Activity: 8-22 mins                     Debe Coder PT Acute Rehabilitation Services Pager [phone] Office [phone]    Rionna Feltes 11/14/2018, 12:45 PM

## 2018-11-14 NOTE — Progress Notes (Signed)
[place] to follow up on delivery of mattress for hospital bed, spoke to [place]. States he will follow up with office. Jonnie Finner RN CCM Case Mgmt phone [phone]

## 2018-12-16 ENCOUNTER — Telehealth: Payer: Self-pay | Admitting: Cardiology

## 2018-12-16 NOTE — Telephone Encounter (Signed)
Please advise if patient should be seen in the office or virtually. Thanks!

## 2018-12-16 NOTE — Telephone Encounter (Signed)
Patient's wife prefers this be an office visit because the patient has had a bad fall and hip surgery since you last saw him.  Please advise

## 2018-12-16 NOTE — Telephone Encounter (Signed)
OK but need to work into the schedule.

## 2018-12-17 NOTE — Telephone Encounter (Signed)
Please switch appointment on 12/21/2018 to an office visit per [place]. Thanks!

## 2018-12-20 NOTE — Progress Notes (Signed)
Cardiology Office Note:    Date:  12/21/2018   ID:  [name], DOB August 12, 1924, MRN [id number]  PCP:  [name], MD  Cardiologist:  Shirlee More, MD    Referring MD: [name], MD    ASSESSMENT:    1. Essential hypertension   2. PAF (paroxysmal atrial fibrillation) ([place])   3. Orthostatic hypotension   4. Parkinson disease ([place])    PLAN:    In order of problems listed above:  1. Presently not on antihypertensive agents and is a contraindication with his autonomic dysfunction 2. Stable I would not place him on beta-blocker antiplatelet or anticoagulant 3. Continue midodrine 4. Followed by neurology   Next appointment: 3 months   Medication Adjustments/Labs and Tests Ordered: Current medicines are reviewed at length with the patient today.  Concerns regarding medicines are outlined above.  Orders Placed This Encounter  Procedures  . Basic Metabolic Panel (BMET)   No orders of the defined types were placed in this encounter.   Chief Complaint  Patient presents with  . Follow-up    after recurrent syncope and hip fracture  . Atrial Fibrillation    History of Present Illness:    Joe Glover is a 85 y.o. male with a hx of a hx of HTN, HLD, colon cancer (S/P colectomy), prostate cancer (S/P prostatectomy and orchiectomy), GERD, severe right knee OA, vascular Parkinsonism with baseline mild dysarthria, OSA on CPAP, paroxysmal A. fib (not on anticoagulation; prior GI bleed in 2014 requiring 16 units PRBCs) and recurrent syncopal episodes.  His last loop recorder download 02/09/2018 shows brief episodes of atrial fibrillation.  He has been at device [facility] since April 2019.  He was last seen 09/16/18.  His subsequent had another fall with hip fracture requiring total hip arthroplasty.  While in hospital he was noted to have paroxysmal atrial fibrillation received IV and subsequently placed on oral metoprolol and not sure what this.  Hypotension he just  has autonomic failure these episodes are unpredictable often occur sitting Christmas Thanksgiving at the table church he has had a subdural hematoma  Compliance with diet, lifestyle and medications: Yes  He is steadily improving as home physical therapy and has not had any episodes of palpitation or syncope.  The episode with a fall and hip fracture was a misstep and not syncope.  Fortunately his wife never started aspirin or beta-blocker.  He had electrolyte abnormality in the hospital hyponatremia hypokalemia we will check his labs before he leaves my office today Past Medical History:  Diagnosis Date  . Awareness alteration, transient 11/02/2014  . Cellulitis of right hand 01/27/2017  . Colon cancer ([place])   . Essential hypertension 09/23/2012  . Fall 09/16/2016  . Fall from standing 02/25/2017  . GI bleed   . Hyperlipidemia   . Hypertension   . Insomnia 08/13/2016  . Normocytic anemia 01/27/2017  . On amiodarone therapy 08/06/2015  . Orthostatic hypotension 08/06/2015  . OSA (obstructive sleep apnea)   . PAF (paroxysmal atrial fibrillation) ([place])   . Parkinson disease ([place])   . Prostate cancer ([place])   . SDH (subdural hematoma) ([place]) 09/16/2016  . Status post placement of implantable loop recorder 07/11/2015  . Stroke ([place])   . 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 Subdural hematoma ([place]) 09/05/2016  . Syncope 11/02/2014  . TIA (transient ischemic attack)   . Vitamin D deficiency  Past Surgical History:  Procedure Laterality Date  . COLECTOMY    . HERNIA REPAIR    . ORCHIECTOMY    . PROSTATECTOMY    . TOTAL HIP ARTHROPLASTY Right 11/11/2018   Procedure: TOTAL HIP ARTHROPLASTY ANTERIOR APPROACH;  Surgeon: Gaynelle Arabian, MD;  Location: [facility];  Service: Orthopedics;  Laterality: Right;  100    Current Medications: Current Meds  Medication Sig  . Cholecalciferol (VITAMIN D3 PO) Take 5,000 mg by  mouth daily.  . cyanocobalamin (,VITAMIN B-12,) 1000 MCG/ML injection Inject 1,000 mcg into the muscle every 30 (thirty) days.   [name] Kitchen EPINEPHrine 0.3 mg/0.3 mL IJ SOAJ injection Inject 0.3 mg into the muscle daily as needed (allergic reaction).  . ferrous sulfate 325 (65 FE) MG tablet Take 325 mg by mouth 2 (two) times daily with a meal.   . ipratropium (ATROVENT) 0.03 % nasal spray Place 1 spray into the nose 2 (two) times daily.  [name] Kitchen loratadine (CLARITIN) 10 MG tablet Take 10 mg by mouth at bedtime.   . magnesium oxide (MAG-OX) 400 MG tablet Take 800 mg by mouth 2 (two) times daily.   . Melatonin 5 MG TABS Take 5 mg by mouth at bedtime as needed.  . midodrine (PROAMATINE) 2.5 MG tablet Take 2.5 mg by mouth 2 (two) times a day.  [name] Kitchen omeprazole (PRILOSEC) 20 MG capsule Take 20 mg by mouth daily.   . OnabotulinumtoxinA (BOTOX IJ) Inject as directed every 3 (three) months. Next injection due in December 2019  . potassium chloride SA (K-DUR,KLOR-CON) 20 MEQ tablet Take 10 mEq by mouth daily.   . pravastatin (PRAVACHOL) 40 MG tablet Take 40 mg by mouth at bedtime.   [name] Kitchen PRESCRIPTION MEDICATION Inhale into the lungs at bedtime. CPAP  . Probiotic Product (PROBIOTIC DAILY PO) Take 1 tablet by mouth at bedtime.   . psyllium (METAMUCIL) 58.6 % packet Take 1 packet by mouth 2 (two) times daily.   . sodium chloride 1 g tablet Take 1 tablet (1 g total) by mouth daily.  . temazepam (RESTORIL) 7.5 MG capsule Take 1 capsule (7.5 mg total) by mouth at bedtime as needed for sleep.     Allergies:   Aspirin, Augmentin [amoxicillin-pot clavulanate], Hemocyte plus [hematinic plus vit-minerals], and Penicillins   Social History   Socioeconomic History  . Marital status: Married    Spouse name: Not on file  . Number of children: 2  . Years of education: Not on file  . Highest education level: Not on file  Occupational History  . Occupation: retired  Scientific laboratory technician  . Financial resource strain: Not on file  . Food  insecurity    Worry: Not on file    Inability: Not on file  . Transportation needs    Medical: Not on file    Non-medical: Not on file  Tobacco Use  . Smoking status: Former Smoker    Packs/day: 2.00    Years: 20.00    Pack years: 40.00    Types: Cigarettes    Quit date: 07/08/1958    Years since quitting: 60.4  . Smokeless tobacco: Never Used  Substance and Sexual Activity  . Alcohol use: Yes    Alcohol/week: 0.0 standard drinks    Comment: occasional - 1-2 drinks a month  . Drug use: No  . Sexual activity: Not on file  Lifestyle  . Physical activity    Days per week: Not on file    Minutes per session: Not on file  .  Stress: Not on file  Relationships  . Social Herbalist on phone: Not on file    Gets together: Not on file    Attends religious service: Not on file    Active member of club or organization: Not on file    Attends meetings of clubs or organizations: Not on file    Relationship status: Not on file  Other Topics Concern  . Not on file  Social History Narrative  . Not on file     Family History: The patient's family history includes Heart attack in his brother and father; Parkinson's disease in his mother. ROS:   Please see the history of present illness.    All other systems reviewed and are negative.  EKGs/Labs/Other Studies Reviewed:    The following studies were reviewed today:  Recent Labs: 05/31/2018: B Natriuretic Peptide 206.7 11/09/2018: ALT 47 11/10/2018: Magnesium 1.7 11/13/2018: BUN 25; Creatinine, Ser 0.86; Potassium 4.9; Sodium 138 11/14/2018: Hemoglobin 11.1; Platelets 271  Recent Lipid Panel No results found for: CHOL, TRIG, HDL, CHOLHDL, VLDL, LDLCALC, LDLDIRECT  Physical Exam:    VS:  BP 118/66 (BP Location: Right Arm, Patient Position: Sitting, Cuff Size: Normal)   Pulse 65   Temp (!) 97.3 F (36.3 C)   Ht 6' (1.829 m)   Wt 154 lb 6.4 oz (70 kg)   SpO2 97%   BMI 20.94 kg/m     Wt Readings from Last 3 Encounters:   12/21/18 154 lb 6.4 oz (70 kg)  11/09/18 154 lb 8.7 oz (70.1 kg)  09/16/18 158 lb (71.7 kg)     GEN: He appears frail well nourished, well developed in no acute distress HEENT: Normal NECK: No JVD; No carotid bruits LYMPHATICS: No lymphadenopathy CARDIAC: RRR, no murmurs, rubs, gallops RESPIRATORY:  Clear to auscultation without rales, wheezing or rhonchi  ABDOMEN: Soft, non-tender, non-distended MUSCULOSKELETAL:  No edema; No deformity  SKIN: Warm and dry NEUROLOGIC:  Alert and oriented x 3 PSYCHIATRIC:  Normal affect    Signed, Shirlee More, MD  12/21/2018 3:41 PM    [facility]

## 2018-12-21 ENCOUNTER — Ambulatory Visit (INDEPENDENT_AMBULATORY_CARE_PROVIDER_SITE_OTHER): Payer: Medicare Other | Admitting: Cardiology

## 2018-12-21 ENCOUNTER — Other Ambulatory Visit: Payer: Self-pay

## 2018-12-21 VITALS — BP 118/66 | HR 65 | Temp 97.3°F | Ht 72.0 in | Wt 154.4 lb

## 2018-12-21 DIAGNOSIS — I951 Orthostatic hypotension: Secondary | ICD-10-CM

## 2018-12-21 DIAGNOSIS — I10 Essential (primary) hypertension: Secondary | ICD-10-CM | POA: Diagnosis not present

## 2018-12-21 DIAGNOSIS — I480 Paroxysmal atrial fibrillation: Secondary | ICD-10-CM

## 2018-12-21 DIAGNOSIS — G20 Parkinson's disease: Secondary | ICD-10-CM | POA: Diagnosis not present

## 2018-12-21 NOTE — Patient Instructions (Signed)
Medication Instructions:  Your physician recommends that you continue on your current medications as directed. Please refer to the Current Medication list given to you today.  If you need a refill on your cardiac medications before your next appointment, please call your pharmacy.   Lab work: Your physician recommends that you return for lab work in: TODAY BMP  If you have labs (blood work) drawn today and your tests are completely normal, you will receive your results only by: [name] Kitchen MyChart Message (if you have MyChart) OR . A paper copy in the mail If you have any lab test that is abnormal or we need to change your treatment, we will call you to review the results.  Testing/Procedures: None  Follow-Up: At [facility], you and your health needs are our priority.  As part of our continuing mission to provide you with exceptional heart care, we have created designated Provider Care Teams.  These Care Teams include your primary Cardiologist (physician) and Advanced Practice Providers (APPs -  Physician Assistants and Nurse Practitioners) who all work together to provide you with the care you need, when you need it. You will need a follow up appointment in 3 months.  Any Other Special Instructions Will Be Listed Below (If Applicable).

## 2018-12-22 LAB — BASIC METABOLIC PANEL
BUN/Creatinine Ratio: 12 (ref 10–24)
BUN: 12 mg/dL (ref 10–36)
CO2: 24 mmol/L (ref 20–29)
Calcium: 8.5 mg/dL — ABNORMAL LOW (ref 8.6–10.2)
Chloride: 98 mmol/L (ref 96–106)
Creatinine, Ser: 1.03 mg/dL (ref 0.76–1.27)
GFR calc Af Amer: 73 mL/min/{1.73_m2} (ref >59)
GFR calc non Af Amer: 63 mL/min/{1.73_m2} (ref >59)
Glucose: 104 mg/dL — ABNORMAL HIGH (ref 65–99)
Potassium: 4.4 mmol/L (ref 3.5–5.2)
Sodium: 133 mmol/L — ABNORMAL LOW (ref 134–144)

## 2019-02-15 ENCOUNTER — Other Ambulatory Visit: Payer: Self-pay

## 2019-02-15 ENCOUNTER — Telehealth: Payer: Self-pay

## 2019-02-15 MED ORDER — CLONIDINE HCL 0.1 MG PO TABS: ORAL_TABLET | ORAL | 1 refills | Status: DC

## 2019-02-15 MED ORDER — CLONIDINE HCL 0.1 MG PO TABS: ORAL_TABLET | ORAL | 11 refills | Status: DC

## 2019-02-15 NOTE — Telephone Encounter (Signed)
1. What is his BP when standing? (concern for orthostatic, wife typically checks sitting and standing)  2. Add Rx Clonidine 0.1 mg daily as needed for systolic BP >507. If/when he takes this prescription he needs to be sitting or lying and sit for at least 2 hours. This is to prevent falls from low blood pressure after taking.   3. Would not recommend midodrine unless systolic BP <225, good call on the wife's part to hold this medication  4. Follow up with BP report in 1 week with added Clonidine. If wife feels he needs an appt he can be scheduled. This hypertension/hypotension is likely secondary to his Parkinson's.

## 2019-02-15 NOTE — Telephone Encounter (Signed)
Patient's wife advised to check blood pressure sitting and standing everyday and record readings.  Wife advised to add clonidine 0.1mg  daily as needed for systolic (top number) BP greater than 180.  Patient's wife thinks she has clonidine on hand, but if not she will contact office for refill.  Wife advised to continue to hold midodrine unless systolic BP is less than 040.  Patient's wife will return copy of BP readings for 1 week to the [place] location next Monday for Dr [name] to review.  Patient's wife agreed to plan and verbalized understanding. No further questions.

## 2019-02-15 NOTE — Telephone Encounter (Signed)
Kelly from [facility] called stating that patients blood pressure has been running around 180/100.  Per Claiborne Billings, patient's wife has not been able to give patient midodrine for the past 2 weeks because of elevated blood pressures.  Patient is mobile and doing well otherwise.  Patient's wife would like patient to be seen ASAP.    Please advise.

## 2019-02-17 ENCOUNTER — Telehealth: Payer: Self-pay | Admitting: Cardiology

## 2019-02-17 MED ORDER — CLONIDINE HCL 0.1 MG PO TABS: ORAL_TABLET | ORAL | 1 refills | Status: DC

## 2019-02-17 NOTE — Telephone Encounter (Signed)
Clonidine

## 2019-02-17 NOTE — Telephone Encounter (Signed)
Rx sent to pharmacy for Clonidine 0.1mg  one tablet daily for BP greater that 180.

## 2019-02-18 MED ORDER — CLONIDINE HCL 0.1 MG PO TABS: ORAL_TABLET | ORAL | 1 refills | Status: DC

## 2019-02-18 NOTE — Telephone Encounter (Signed)
Rx sent to pharmacy for Clonidine 0.1mg  as requested.

## 2019-02-18 NOTE — Telephone Encounter (Signed)
Patient called stating [facility] did not receive script and need today please.

## 2019-02-18 NOTE — Addendum Note (Signed)
Addended by: Stevan Born on: 02/18/2019 03:55 PM   Modules accepted: Orders

## 2019-02-19 MED ORDER — CLONIDINE HCL 0.1 MG PO TABS: ORAL_TABLET | ORAL | 1 refills | Status: DC

## 2019-02-19 NOTE — Addendum Note (Signed)
Addended by: Stevan Born on: 02/19/2019 11:06 AM   Modules accepted: Orders

## 2019-02-22 ENCOUNTER — Telehealth: Payer: Self-pay | Admitting: Cardiology

## 2019-02-22 NOTE — Telephone Encounter (Signed)
States they are supposed to drop off BP readings, informed pt they could drop them off Wednesday or Friday between 4 and 4:30 but stated they wanted to drop them off today.  Didn't know which time works best for yall

## 2019-02-22 NOTE — Telephone Encounter (Signed)
Patient's BP log was dropped off with Mickel Baas, RN. Will have Dr. [name] review.

## 2019-03-01 NOTE — Telephone Encounter (Signed)
Patient's wife, Joe Glover, per DPR informed that Dr. [name] reviewed patient's most recent blood pressure log and advised to continue taking clonidine 0.1 mg daily as needed if systolic BP is greater than 180 and continue taking midodrine 2.5 mg twice daily if systolic BP is less than [id number]. Joe Glover verbalized understanding. Reminded Joe Glover of patient's upcoming appointment on 03/09/2019 at 3:55 pm at the [facility] [place] office. Joe Glover is agreeable. No further questions.

## 2019-03-08 NOTE — Progress Notes (Signed)
Cardiology Office Note:    Date:  03/09/2019   ID:  Joe Glover, DOB 10-29-1924, MRN [id number]  PCP:  [name], MD  Cardiologist:  Shirlee More, MD    Referring MD: [name], MD    ASSESSMENT:    1. Essential hypertension   2. Orthostatic hypotension   3. PAF (paroxysmal atrial fibrillation) ([place])   4. Sick sinus syndrome (HCC)    PLAN:    In order of problems listed above:  1. Stable will pay attention to a standing blood pressure and avoid overtreatment of hypertension which is counterproductive with his severe autonomic dysfunction Parkinson's disease and will use Catapres as needed for hypertensive urgency 2. Improved at very high risk of trauma injury and will use his alpha agonist on a as needed basis for standing blood pressure less than 100 3. Stable no clinical recurrence he is an absolute contraindication anticoagulation 4. Stable he has had extensive monitoring in the past and we found no bradycardia justifying pacemaker insertion and I would not repeat arrhythmia evaluation   Next appointment: 6 months   Medication Adjustments/Labs and Tests Ordered: Current medicines are reviewed at length with the patient today.  Concerns regarding medicines are outlined above.  Orders Placed This Encounter  Procedures  . EKG 12-Lead   Meds ordered this encounter  Medications  . midodrine (PROAMATINE) 2.5 MG tablet    Sig: Take 1 tablet daily in the morning if two sequential systolic BP readings (top number) within 15 minutes are less than 100.    Chief Complaint  Patient presents with  . Other    3 month f/u c/o BP issues. Meds reviewed verbally with pt.    History of Present Illness:    Joe Glover is a 85 y.o. male with a hx of HTN, HLD, colon cancer (S/P colectomy), prostate cancer (S/P prostatectomy and orchiectomy), GERD, severe right knee OA, vascular Parkinsonism with baseline mild dysarthria, OSA on CPAP, paroxysmal A. fib (not on  anticoagulation; prior GI bleed in 2014 requiring 16 units PRBCs) and recurrent syncopal episodes  last seen 06.15/2020. Compliance with diet, lifestyle and medications: Yes  He is improved with intensive in-home physical therapy and is homebound in my opinion.  Physical therapist has been emphatic with the family about treating hypertension I do not think understands the severity of his autonomic dysfunction hypotension and injury due to Parkinson's.  I reinforced with the wife that the only important blood pressure is is standing and the necessity to take an alpha agonist at least on a as needed basis she is in agreement.  None of his setting blood pressures are in the realm of hypertensive crisis.  I think that I alleviators her anxiety.  He has had no chest pain shortness of breath or recurrent syncope. Past Medical History:  Diagnosis Date  . Awareness alteration, transient 11/02/2014  . Cellulitis of right hand 01/27/2017  . Colon cancer ([place])   . Essential hypertension 09/23/2012  . Fall 09/16/2016  . Fall from standing 02/25/2017  . GI bleed   . Hyperlipidemia   . Hypertension   . Insomnia 08/13/2016  . Normocytic anemia 01/27/2017  . On amiodarone therapy 08/06/2015  . Orthostatic hypotension 08/06/2015  . OSA (obstructive sleep apnea)   . PAF (paroxysmal atrial fibrillation) ([place])   . Parkinson disease ([place])   . Prostate cancer ([place])   . SDH (subdural hematoma) ([place]) 09/16/2016  . Status post placement of implantable loop recorder 07/11/2015  .  Stroke ([place])   . 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 Subdural hematoma ([place]) 09/05/2016  . Syncope 11/02/2014  . TIA (transient ischemic attack)   . Vitamin D deficiency     Past Surgical History:  Procedure Laterality Date  . COLECTOMY    . HERNIA REPAIR    . ORCHIECTOMY    . PROSTATECTOMY    . TOTAL HIP ARTHROPLASTY Right 11/11/2018   Procedure:  TOTAL HIP ARTHROPLASTY ANTERIOR APPROACH;  Surgeon: Gaynelle Arabian, MD;  Location: [facility]S;  Service: Orthopedics;  Laterality: Right;  100    Current Medications: Current Meds  Medication Sig  . Cholecalciferol (VITAMIN D3 PO) Take 5,000 mg by mouth daily.  . cloNIDine (CATAPRES) 0.1 MG tablet Take 1 tablet daily as needed for systolic (top number) blood pressure greater than 180  . cyanocobalamin (,VITAMIN B-12,) 1000 MCG/ML injection Inject 1,000 mcg into the muscle every 30 (thirty) days.   [name] Kitchen EPINEPHrine 0.3 mg/0.3 mL IJ SOAJ injection Inject 0.3 mg into the muscle daily as needed (allergic reaction).  . ferrous sulfate 325 (65 FE) MG tablet Take 325 mg by mouth 3 (three) times daily with meals.   [name] Kitchen ipratropium (ATROVENT) 0.03 % nasal spray Place 1 spray into the nose 2 (two) times daily.  [name] Kitchen loratadine (CLARITIN) 10 MG tablet Take 10 mg by mouth at bedtime.   . magnesium oxide (MAG-OX) 400 MG tablet Take 800 mg by mouth 2 (two) times daily.   . Melatonin 5 MG TABS Take 5 mg by mouth at bedtime as needed.  . midodrine (PROAMATINE) 2.5 MG tablet Take 1 tablet daily in the morning if two sequential systolic BP readings (top number) within 15 minutes are less than 100.  [name] Kitchen omeprazole (PRILOSEC) 20 MG capsule Take 20 mg by mouth daily.   . OnabotulinumtoxinA (BOTOX IJ) Inject as directed every 3 (three) months. Next injection due in December 2019  . potassium chloride SA (K-DUR,KLOR-CON) 20 MEQ tablet Take 20 mEq by mouth daily.   . pravastatin (PRAVACHOL) 40 MG tablet Take 40 mg by mouth at bedtime.   [name] Kitchen PRESCRIPTION MEDICATION Inhale into the lungs at bedtime. CPAP  . Probiotic Product (PROBIOTIC DAILY PO) Take 1 tablet by mouth at bedtime.   . psyllium (METAMUCIL) 58.6 % packet Take 1 packet by mouth 2 (two) times daily.   . sodium chloride 1 g tablet Take 1 tablet (1 g total) by mouth daily.  . temazepam (RESTORIL) 7.5 MG capsule Take 1 capsule (7.5 mg total) by mouth at bedtime as needed for  sleep.  . [DISCONTINUED] midodrine (PROAMATINE) 2.5 MG tablet Take 2.5 mg by mouth 2 (two) times a day.     Allergies:   Aspirin, Augmentin [amoxicillin-pot clavulanate], Hemocyte plus [hematinic plus vit-minerals], and Penicillins   Social History   Socioeconomic History  . Marital status: Married    Spouse name: Not on file  . Number of children: 2  . Years of education: Not on file  . Highest education level: Not on file  Occupational History  . Occupation: retired  Scientific laboratory technician  . Financial resource strain: Not on file  . Food insecurity    Worry: Not on file    Inability: Not on file  . Transportation needs    Medical: Not on file    Non-medical: Not on file  Tobacco Use  . Smoking status: Former Smoker  Packs/day: 2.00    Years: 20.00    Pack years: 40.00    Types: Cigarettes    Quit date: 07/08/1958    Years since quitting: 60.7  . Smokeless tobacco: Never Used  Substance and Sexual Activity  . Alcohol use: Yes    Alcohol/week: 0.0 standard drinks    Comment: occasional - 1-2 drinks a month  . Drug use: No  . Sexual activity: Not on file  Lifestyle  . Physical activity    Days per week: Not on file    Minutes per session: Not on file  . Stress: Not on file  Relationships  . Social Herbalist on phone: Not on file    Gets together: Not on file    Attends religious service: Not on file    Active member of club or organization: Not on file    Attends meetings of clubs or organizations: Not on file    Relationship status: Not on file  Other Topics Concern  . Not on file  Social History Narrative  . Not on file     Family History: The patient's family history includes Heart attack in his brother and father; Parkinson's disease in his mother. ROS:   Please see the history of present illness.    All other systems reviewed and are negative.  EKGs/Labs/Other Studies Reviewed:    The following studies were reviewed today:    Recent Labs:  05/31/2018: B Natriuretic Peptide 206.7 11/09/2018: ALT 47 11/10/2018: Magnesium 1.7 11/14/2018: Hemoglobin 11.1; Platelets 271 12/21/2018: BUN 12; Creatinine, Ser 1.03; Potassium 4.4; Sodium 133  Recent Lipid Panel No results found for: CHOL, TRIG, HDL, CHOLHDL, VLDL, LDLCALC, LDLDIRECT  Physical Exam:    VS:  BP 140/82 (BP Location: Right Arm, Patient Position: Sitting, Cuff Size: Normal)   Pulse 67   Ht 6' (1.829 m)   Wt 153 lb 4 oz (69.5 kg)   SpO2 98%   BMI 20.78 kg/m     Wt Readings from Last 3 Encounters:  03/09/19 153 lb 4 oz (69.5 kg)  12/21/18 154 lb 6.4 oz (70 kg)  11/09/18 154 lb 8.7 oz (70.1 kg)     GEN: He looks chronically ill frail very thin well nourished, well developed in no acute distress HEENT: Normal NECK: No JVD; No carotid bruits LYMPHATICS: No lymphadenopathy CARDIAC: RRR, no murmurs, rubs, gallops RESPIRATORY:  Clear to auscultation without rales, wheezing or rhonchi  ABDOMEN: Soft, non-tender, non-distended MUSCULOSKELETAL:  No edema; No deformity  SKIN: Warm and dry NEUROLOGIC:  Alert and oriented x 3 PSYCHIATRIC:  Normal affect    Signed, Shirlee More, MD  03/09/2019 6:25 PM    [place]

## 2019-03-09 ENCOUNTER — Encounter: Payer: Self-pay | Admitting: Cardiology

## 2019-03-09 ENCOUNTER — Other Ambulatory Visit: Payer: Self-pay

## 2019-03-09 ENCOUNTER — Encounter: Payer: Self-pay | Admitting: *Deleted

## 2019-03-09 ENCOUNTER — Telehealth: Payer: Self-pay | Admitting: Cardiology

## 2019-03-09 ENCOUNTER — Ambulatory Visit (INDEPENDENT_AMBULATORY_CARE_PROVIDER_SITE_OTHER): Payer: Medicare Other | Admitting: Cardiology

## 2019-03-09 VITALS — BP 140/82 | HR 67 | Ht 72.0 in | Wt 153.2 lb

## 2019-03-09 DIAGNOSIS — I10 Essential (primary) hypertension: Secondary | ICD-10-CM

## 2019-03-09 DIAGNOSIS — I951 Orthostatic hypotension: Secondary | ICD-10-CM

## 2019-03-09 DIAGNOSIS — I495 Sick sinus syndrome: Secondary | ICD-10-CM | POA: Diagnosis not present

## 2019-03-09 DIAGNOSIS — I480 Paroxysmal atrial fibrillation: Secondary | ICD-10-CM

## 2019-03-09 MED ORDER — MIDODRINE HCL 2.5 MG PO TABS: ORAL_TABLET | ORAL | Status: DC

## 2019-03-09 NOTE — Telephone Encounter (Signed)
Left message for Kelly to return call.  

## 2019-03-09 NOTE — Patient Instructions (Signed)
Medication Instructions:  Your physician has recommended you make the following change in your medication:   CHANGE midodrine (proamatine) 2.5 mg: Take 1 tablet daily in the morning if two sequential systolic BP readings (top number) within 15 minutes are less than 100.   If you need a refill on your cardiac medications before your next appointment, please call your pharmacy.   Lab work: None  If you have labs (blood work) drawn today and your tests are completely normal, you will receive your results only by: [name] Kitchen MyChart Message (if you have MyChart) OR . A paper copy in the mail If you have any lab test that is abnormal or we need to change your treatment, we will call you to review the results.  Testing/Procedures: You had an EKG today.   Follow-Up: At [facility], you and your health needs are our priority.  As part of our continuing mission to provide you with exceptional heart care, we have created designated Provider Care Teams.  These Care Teams include your primary Cardiologist (physician) and Advanced Practice Providers (APPs -  Physician Assistants and Nurse Practitioners) who all work together to provide you with the care you need, when you need it. You will need a follow up appointment in 6 months.  Please call our office 2 months in advance to schedule this appointment.

## 2019-03-09 NOTE — Telephone Encounter (Signed)
**Note Joe-Identified via Obfuscation** Joe Billings, RN with [facility], called to update [place] before patient's follow up appointment this afternoon. Joe Glover states that patient has had 4 months of home health physical therapy and has went from being bed bound to being able to walk out to his car with a walker. Joe Glover reports that patient has reached his maximum potential with Home Health PT. This has been explained to patient's wife, Joe Glover, who states that she was maintenance PT at home. Joe Billings, RN, the physical therapists, and Technical sales engineer with [facility] have all explained to Joe Glover on multiple occassions that insurance will not cover long-term home health PT; however, Joe Glover is still insistent and claims that she will talk to Dr. [name] about this during today's appointment.   Joe Glover reports that if Dr. [name] wants to extend Joe Glover PT, they can attempt one more extension but it might be denied as patient is able to participate in outpatient PT at this time. Joe Glover has been offered this option but she prefers home health PT instead of outpatient.   Patient's follow up appointment today is to discuss his elevated blood pressure now that he is no longer bed bound.   Will make Dr. [name] aware of this situation and advise during patient's appointment this afternoon at 3:55 pm.

## 2019-03-09 NOTE — Telephone Encounter (Signed)
Claiborne Billings from home health care has info on this patient. Please call at [phone].

## 2019-03-16 ENCOUNTER — Telehealth: Payer: Self-pay | Admitting: *Deleted

## 2019-03-16 NOTE — Telephone Encounter (Signed)
Mark with Home Health Physical Therapy called requesting orders to extend patient's physical therapy starting 03/14/2019. Patient will receive at home physical therapy twice weekly for 2 weeks then once weekly for 2 weeks. Dr. [name] is agreeable to this plan and Elta Guadeloupe was given a verbal order as requested. No further questions.

## 2019-06-17 ENCOUNTER — Other Ambulatory Visit: Payer: Self-pay

## 2019-06-17 ENCOUNTER — Emergency Department (HOSPITAL_COMMUNITY): Payer: Medicare Other

## 2019-06-17 ENCOUNTER — Observation Stay (HOSPITAL_COMMUNITY)
Admission: EM | Admit: 2019-06-17 | Discharge: 2019-06-18 | Disposition: A | Discharge status: Home Health | Payer: Medicare Other | Attending: Internal Medicine | Admitting: Internal Medicine

## 2019-06-17 DIAGNOSIS — Z87891 Personal history of nicotine dependence: Secondary | ICD-10-CM | POA: Insufficient documentation

## 2019-06-17 DIAGNOSIS — I951 Orthostatic hypotension: Secondary | ICD-10-CM | POA: Diagnosis not present

## 2019-06-17 DIAGNOSIS — Z79899 Other long term (current) drug therapy: Secondary | ICD-10-CM | POA: Insufficient documentation

## 2019-06-17 DIAGNOSIS — R479 Unspecified speech disturbances: Secondary | ICD-10-CM | POA: Insufficient documentation

## 2019-06-17 DIAGNOSIS — E876 Hypokalemia: Secondary | ICD-10-CM | POA: Insufficient documentation

## 2019-06-17 DIAGNOSIS — I351 Nonrheumatic aortic (valve) insufficiency: Secondary | ICD-10-CM | POA: Diagnosis not present

## 2019-06-17 DIAGNOSIS — G4730 Sleep apnea, unspecified: Secondary | ICD-10-CM | POA: Diagnosis not present

## 2019-06-17 DIAGNOSIS — K529 Noninfective gastroenteritis and colitis, unspecified: Secondary | ICD-10-CM | POA: Insufficient documentation

## 2019-06-17 DIAGNOSIS — Z9181 History of falling: Secondary | ICD-10-CM | POA: Diagnosis not present

## 2019-06-17 DIAGNOSIS — S06351A Traumatic hemorrhage of left cerebrum with loss of consciousness of 30 minutes or less, initial encounter: Principal | ICD-10-CM | POA: Insufficient documentation

## 2019-06-17 DIAGNOSIS — E871 Hypo-osmolality and hyponatremia: Secondary | ICD-10-CM | POA: Insufficient documentation

## 2019-06-17 DIAGNOSIS — S06361A Traumatic hemorrhage of cerebrum, unspecified, with loss of consciousness of 30 minutes or less, initial encounter: Secondary | ICD-10-CM | POA: Diagnosis not present

## 2019-06-17 DIAGNOSIS — Z8546 Personal history of malignant neoplasm of prostate: Secondary | ICD-10-CM | POA: Diagnosis not present

## 2019-06-17 DIAGNOSIS — S06359A Traumatic hemorrhage of left cerebrum with loss of consciousness of unspecified duration, initial encounter: Secondary | ICD-10-CM | POA: Diagnosis present

## 2019-06-17 DIAGNOSIS — R55 Syncope and collapse: Secondary | ICD-10-CM | POA: Diagnosis present

## 2019-06-17 DIAGNOSIS — Z820 Family history of epilepsy and other diseases of the nervous system: Secondary | ICD-10-CM | POA: Insufficient documentation

## 2019-06-17 DIAGNOSIS — S06369A Traumatic hemorrhage of cerebrum, unspecified, with loss of consciousness of unspecified duration, initial encounter: Secondary | ICD-10-CM | POA: Diagnosis present

## 2019-06-17 DIAGNOSIS — I10 Essential (primary) hypertension: Secondary | ICD-10-CM | POA: Insufficient documentation

## 2019-06-17 DIAGNOSIS — I480 Paroxysmal atrial fibrillation: Secondary | ICD-10-CM | POA: Insufficient documentation

## 2019-06-17 DIAGNOSIS — G4700 Insomnia, unspecified: Secondary | ICD-10-CM | POA: Diagnosis not present

## 2019-06-17 DIAGNOSIS — W1839XA Other fall on same level, initial encounter: Secondary | ICD-10-CM | POA: Insufficient documentation

## 2019-06-17 DIAGNOSIS — Z8249 Family history of ischemic heart disease and other diseases of the circulatory system: Secondary | ICD-10-CM | POA: Insufficient documentation

## 2019-06-17 DIAGNOSIS — Z888 Allergy status to other drugs, medicaments and biological substances status: Secondary | ICD-10-CM | POA: Insufficient documentation

## 2019-06-17 DIAGNOSIS — S0081XA Abrasion of other part of head, initial encounter: Secondary | ICD-10-CM | POA: Insufficient documentation

## 2019-06-17 DIAGNOSIS — Z20828 Contact with and (suspected) exposure to other viral communicable diseases: Secondary | ICD-10-CM | POA: Insufficient documentation

## 2019-06-17 DIAGNOSIS — G20 Parkinson's disease: Secondary | ICD-10-CM | POA: Insufficient documentation

## 2019-06-17 DIAGNOSIS — F0280 Dementia in other diseases classified elsewhere without behavioral disturbance: Secondary | ICD-10-CM | POA: Diagnosis not present

## 2019-06-17 DIAGNOSIS — I495 Sick sinus syndrome: Secondary | ICD-10-CM | POA: Insufficient documentation

## 2019-06-17 DIAGNOSIS — Z96641 Presence of right artificial hip joint: Secondary | ICD-10-CM | POA: Diagnosis not present

## 2019-06-17 DIAGNOSIS — G4733 Obstructive sleep apnea (adult) (pediatric): Secondary | ICD-10-CM | POA: Diagnosis not present

## 2019-06-17 DIAGNOSIS — Z85038 Personal history of other malignant neoplasm of large intestine: Secondary | ICD-10-CM | POA: Insufficient documentation

## 2019-06-17 DIAGNOSIS — Y9389 Activity, other specified: Secondary | ICD-10-CM | POA: Diagnosis not present

## 2019-06-17 DIAGNOSIS — S0633AA Contusion and laceration of cerebrum, unspecified, with loss of consciousness status unknown, initial encounter: Secondary | ICD-10-CM | POA: Diagnosis present

## 2019-06-17 DIAGNOSIS — F458 Other somatoform disorders: Secondary | ICD-10-CM | POA: Diagnosis not present

## 2019-06-17 DIAGNOSIS — E785 Hyperlipidemia, unspecified: Secondary | ICD-10-CM | POA: Diagnosis not present

## 2019-06-17 DIAGNOSIS — I447 Left bundle-branch block, unspecified: Secondary | ICD-10-CM | POA: Insufficient documentation

## 2019-06-17 DIAGNOSIS — S06329A Contusion and laceration of left cerebrum with loss of consciousness of unspecified duration, initial encounter: Secondary | ICD-10-CM

## 2019-06-17 DIAGNOSIS — M2578 Osteophyte, vertebrae: Secondary | ICD-10-CM | POA: Insufficient documentation

## 2019-06-17 DIAGNOSIS — M1120 Other chondrocalcinosis, unspecified site: Secondary | ICD-10-CM | POA: Diagnosis not present

## 2019-06-17 DIAGNOSIS — W19XXXA Unspecified fall, initial encounter: Secondary | ICD-10-CM | POA: Diagnosis not present

## 2019-06-17 DIAGNOSIS — Z886 Allergy status to analgesic agent status: Secondary | ICD-10-CM | POA: Insufficient documentation

## 2019-06-17 DIAGNOSIS — Z880 Allergy status to penicillin: Secondary | ICD-10-CM | POA: Insufficient documentation

## 2019-06-17 DIAGNOSIS — Z881 Allergy status to other antibiotic agents status: Secondary | ICD-10-CM | POA: Insufficient documentation

## 2019-06-17 LAB — CBC WITH DIFFERENTIAL/PLATELET
Abs Immature Granulocytes: 0.03 10*3/uL (ref 0.00–0.07)
Basophils Absolute: 0 10*3/uL (ref 0.0–0.1)
Basophils Relative: 0 %
Eosinophils Absolute: 0.2 10*3/uL (ref 0.0–0.5)
Eosinophils Relative: 3 %
HCT: 40.4 % (ref 39.0–52.0)
Hemoglobin: 13.5 g/dL (ref 13.0–17.0)
Immature Granulocytes: 1 %
Lymphocytes Relative: 22 %
Lymphs Abs: 1.2 10*3/uL (ref 0.7–4.0)
MCH: 27.7 pg (ref 26.0–34.0)
MCHC: 33.4 g/dL (ref 30.0–36.0)
MCV: 83 fL (ref 80.0–100.0)
Monocytes Absolute: 0.5 10*3/uL (ref 0.1–1.0)
Monocytes Relative: 9 %
Neutro Abs: 3.5 10*3/uL (ref 1.7–7.7)
Neutrophils Relative %: 65 %
Platelets: 200 10*3/uL (ref 150–400)
RBC: 4.87 MIL/uL (ref 4.22–5.81)
RDW: 14.7 % (ref 11.5–15.5)
WBC: 5.4 10*3/uL (ref 4.0–10.5)
nRBC: 0 % (ref 0.0–0.2)

## 2019-06-17 LAB — BASIC METABOLIC PANEL
Anion gap: 9 (ref 5–15)
BUN: 12 mg/dL (ref 8–23)
CO2: 25 mmol/L (ref 22–32)
Calcium: 8.6 mg/dL — ABNORMAL LOW (ref 8.9–10.3)
Chloride: 93 mmol/L — ABNORMAL LOW (ref 98–111)
Creatinine, Ser: 0.99 mg/dL (ref 0.61–1.24)
GFR calc Af Amer: >60 mL/min (ref >60)
GFR calc non Af Amer: >60 mL/min (ref >60)
Glucose, Bld: 104 mg/dL — ABNORMAL HIGH (ref 70–99)
Potassium: 4.4 mmol/L (ref 3.5–5.1)
Sodium: 127 mmol/L — ABNORMAL LOW (ref 135–145)

## 2019-06-17 NOTE — ED Triage Notes (Signed)
Pt to ED via [facility] after reported having a syncopal episode at home and falling.  Pt alert and oriented x's 4.  Pt has abrasion/avulsion to left forehead.   Pt is not on any blood thinners

## 2019-06-17 NOTE — ED Notes (Signed)
Pt to CT at this time.  Wife remains in room

## 2019-06-17 NOTE — H&P (Addendum)
Taimur Feicht [id number] DOB: Aug 14, 1924 DOA: 06/17/2019     PCP: [name], MD   Outpatient Specialists:   CARDS:   Dr. [name]   NEurology     Dr. Linus Mako   GI* Dr.  Sadie Haber, [place])    Patient arrived to ER on 06/17/19 at 1936  Patient coming from: home Lives  With family    Chief Complaint:  Chief Complaint  Patient presents with   Fall   Loss of Consciousness    HPI: Raymel Vandongen is a 85 y.o. male with medical history significant of paroxysmal atrial fibrillation, sick sinus syndrome, orthostatic hypotension Parkinson disease with autonomic dysfunction, pseudogout, subdural hematoma, hypertension difficult to manage secondary to intermittent orthostatic hypotension, history of frequent falls.  Sleep apnea.  Prostate cancer and colon cancer.  History of stroke  Presented with syncopal episode at home resulting in the fall. He was trying to go to the bathroom and lost his balance.  He hit his head injuring his left side of his forehead.  Denies being on any blood thinner brought in by [facility] Wife states that patient was at home try to get up by himself and fell down.  She was next room and heard him fall and so she went to see him.  He appeared to be out of bed for the few minutes with complete loss of consciousness.  When he finally woke up he was back to his normal self.  Because he have had subdural hematomas after a fall in the past she wanted him to be checked out in the emergency department. Patient is unsure why he fell or how he fell.  They have some family come over for Thanksgiving  BP has been well controled lately And his  wife have not been giving Midodrine or Clonidine  He has hx of hyponatremia and takes daily sodium pills  He has chronic diarrhea/ loose stool ever since he had colon cancer   Infectious risk factors:  Reports none    In  ER RAPID COVID TEST   in house  PCR testing  Pending  Lab Results  Component Value Date    [place] NEGATIVE 11/09/2018    Regarding pertinent Chronic problems:     Hyperlipidemia -  on statins Pravachol   HTN if its over 180 he takes clonadin Given history of orthostatic hypotension patient is on midodrine if BP too low <100     OSA -on nocturnal  CPAP,     Hx of CVA -  With residual deficits  Speech problems not on  Aspirin or Plavix Due to hx of GI bleeding and Subdural bleeding    A. Fib -  - CHA2DS2 vas score 5 :    Not on anticoagulation secondary to Risk of Falls,          -  Rate control: none        - Rhythm control: not on anything  Vascular Parkinson - not any medication  Dementia - recognizes  Family members but unable to perform most ADL   While in ER: No neurological deficits noted on exam but he had significant abrasion to the forehead During work-up found to have sodium of 127 And CT head showed 2 small foci of acute intraparenchymal hemorrhage of the left frontal lobe  ER Provider Called:   Neurosurgery Dr. Arnoldo Morale They Recommend admit to medicine no indication for surgical intervention at this time patient is not a good surgical candidate will  follow along livers   The following Work up has been ordered so far:  Orders Placed This Encounter  Procedures   SARS CORONAVIRUS 2 (TAT 6-24 HRS) Nasopharyngeal Nasopharyngeal Swab   CT Head Wo Contrast   CT Cervical Spine Wo Contrast   CBC with Differential   Basic metabolic panel   Urinalysis, Routine w reflex microscopic   Orthostatic vital signs   Consult to neurology  ALL PATIENTS BEING ADMITTED/HAVING PROCEDURES NEED COVID-19 SCREENING   Consult to neurosurgery  ALL PATIENTS BEING ADMITTED/HAVING PROCEDURES NEED COVID-19 SCREENING   Consult to hospitalist  ALL PATIENTS BEING ADMITTED/HAVING PROCEDURES NEED COVID-19 SCREENING   ED EKG   Saline lock IV    Following Medications were ordered in ER: Medications - No data to display      Consult Orders  (From admission,  onward)         Start     Ordered   06/17/19 2205  Consult to hospitalist  ALL PATIENTS BEING ADMITTED/HAVING PROCEDURES NEED COVID-19 SCREENING  Once    Comments: ALL PATIENTS BEING ADMITTED/HAVING PROCEDURES NEED COVID-19 SCREENING  Provider:  (Not yet assigned)  Question Answer Comment  Place call to: [facility]   Reason for Consult Admit      06/17/19 2205          Significant initial  Findings: Abnormal Labs Reviewed  BASIC METABOLIC PANEL - Abnormal; Notable for the following components:      Result Value   Sodium 127 (*)    Chloride 93 (*)    Glucose, Bld 104 (*)    Calcium 8.6 (*)    All other components within normal limits   Otherwise labs showing:    Recent Labs  Lab 06/17/19 2035  NA 127*  K 4.4  CO2 25  GLUCOSE 104*  BUN 12  CREATININE 0.99  CALCIUM 8.6*    Cr   stable,    Lab Results  Component Value Date   CREATININE 0.99 06/17/2019   CREATININE 1.03 12/21/2018   CREATININE 0.86 11/13/2018    No results for input(s): AST, ALT, ALKPHOS, BILITOT, PROT, ALBUMIN in the last 168 hours. Lab Results  Component Value Date   CALCIUM 8.6 (L) 06/17/2019       WBC      Component Value Date/Time   WBC 5.4 06/17/2019 2035   ANC    Component Value Date/Time   NEUTROABS 3.5 06/17/2019 2035   ALC No components found for: LYMPHAB    Plt: Lab Results  Component Value Date   PLT 200 06/17/2019    Lactic Acid, Venous    Component Value Date/Time   LATICACIDVEN 1.45 07/26/2018 1352     COVID-19 Labs  No results for input(s): DDIMER, FERRITIN, LDH, CRP in the last 72 hours.  Lab Results  Component Value Date   SARSCOV2NAA NEGATIVE 11/09/2018     HG/HCT  Stable     Component Value Date/Time   HGB 13.5 06/17/2019 2035   HCT 40.4 06/17/2019 2035   Troponin  ordered      ECG: Ordered      UA  ordered    CT HEAD acute intraparenchymal bleeding     ED Triage Vitals  Enc Vitals Group     BP 06/17/19 1942 (!) 156/108      Pulse Rate 06/17/19 1942 88     Resp 06/17/19 1942 (!) 22     Temp 06/17/19 1942 97.8 F (36.6 C)     Temp  Source 06/17/19 1942 Oral     SpO2 06/17/19 1942 97 %     Weight 06/17/19 1944 175 lb 4.3 oz (79.5 kg)     Height 06/17/19 1944 6' (1.829 m)     Head Circumference --      Peak Flow --      Pain Score 06/17/19 1943 0     Pain Loc --      Pain Edu? --      Excl. in [place]? --   TMAX(24)@       Latest  Blood pressure (!) 156/108, pulse 88, temperature 97.8 F (36.6 C), temperature source Oral, resp. rate (!) 22, height 6' (1.829 m), weight 79.5 kg, SpO2 97 %.    Hospitalist was called for admission for Syncope and intracranial bleed   Review of Systems:    Pertinent positives include: fatigue, falls  Constitutional:  No weight loss, night sweats, Fevers, chills,  weight loss  HEENT:  No headaches, Difficulty swallowing,Tooth/dental problems,Sore throat,  No sneezing, itching, ear ache, nasal congestion, post nasal drip,  Cardio-vascular:  No chest pain, Orthopnea, PND, anasarca, dizziness, palpitations.no Bilateral lower extremity swelling  GI:  No heartburn, indigestion, abdominal pain, nausea, vomiting, diarrhea, change in bowel habits, loss of appetite, melena, blood in stool, hematemesis Resp:  no shortness of breath at rest. No dyspnea on exertion, No excess mucus, no productive cough, No non-productive cough, No coughing up of blood.No change in color of mucus.No wheezing. Skin:  no rash or lesions. No jaundice GU:  no dysuria, change in color of urine, no urgency or frequency. No straining to urinate.  No flank pain.  Musculoskeletal:  No joint pain or no joint swelling. No decreased range of motion. No back pain.  Psych:  No change in mood or affect. No depression or anxiety. No memory loss.  Neuro: no localizing neurological complaints, no tingling, no weakness, no double vision, no gait abnormality, no slurred speech, no confusion  All systems reviewed and  apart from [place] all are negative  Past Medical History:   Past Medical History:  Diagnosis Date   Awareness alteration, transient 11/02/2014   Cellulitis of right hand 01/27/2017   Colon cancer [facility])    Essential hypertension 09/23/2012   Fall 09/16/2016   Fall from standing 02/25/2017   GI bleed    Hyperlipidemia    Hypertension    Insomnia 08/13/2016   Normocytic anemia 01/27/2017   On amiodarone therapy 08/06/2015   Orthostatic hypotension 08/06/2015   OSA (obstructive sleep apnea)    PAF (paroxysmal atrial fibrillation) (HCC)    Parkinson disease (HCC)    Prostate cancer (HCC)    SDH (subdural hematoma) ([place]) 09/16/2016   Status post placement of implantable loop recorder 07/11/2015   Stroke (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 His smoking use included cigarettes. He has a 40.00 pack-year smoking history. He has never used smokeless tobacco. He reports current alcohol use. He reports that he does not use  drugs.   Family History:    Family History  Problem Relation Age of Onset   Parkinson's disease Mother    Heart attack Father    Heart attack Brother     Allergies: Allergies  Allergen Reactions   Aspirin Other (See Comments)    GI bleed   Augmentin [Amoxicillin-Pot Clavulanate] Hives   Hemocyte Plus [Hematinic Plus  Vit-Minerals] Other (See Comments)    Unknown reaction   Penicillins Hives    Has patient had a PCN reaction causing immediate rash, facial/tongue/throat swelling, SOB or lightheadedness with hypotension: No Has patient had a PCN reaction causing severe rash involving mucus membranes or skin necrosis: Yes Has patient had a PCN reaction that required hospitalization: No Has patient had a PCN reaction occurring within the last 10 years: No If all of the above answers are "NO", then may proceed with Cephalosporin use.     Prior to Admission medications   Medication Sig Start Date End Date Taking? Authorizing Provider  Cholecalciferol (VITAMIN D3 PO) Take 5,000 mg by mouth daily.    [provider]  cloNIDine (CATAPRES) 0.1 MG tablet Take 1 tablet daily as needed for systolic (top number) blood pressure greater than 180 02/19/19   Munley, Hilton Cork, MD  cyanocobalamin (,VITAMIN B-12,) 1000 MCG/ML injection Inject 1,000 mcg into the muscle every 30 (thirty) days.     [provider]  EPINEPHrine 0.3 mg/0.3 mL IJ SOAJ injection Inject 0.3 mg into the muscle daily as needed (allergic reaction).    [provider]  ferrous sulfate 325 (65 FE) MG tablet Take 325 mg by mouth 3 (three) times daily with meals.     [provider]  ipratropium (ATROVENT) 0.03 % nasal spray Place 1 spray into the nose 2 (two) times daily. 08/20/17   [provider]  loratadine (CLARITIN) 10 MG tablet Take 10 mg by mouth at bedtime.     [provider]  magnesium oxide (MAG-OX) 400 MG tablet Take 800 mg by mouth 2 (two) times daily.     [provider]  Melatonin 5 MG TABS Take 5 mg by mouth at bedtime as needed.    [provider]  midodrine (PROAMATINE) 2.5 MG tablet Take 1 tablet daily in the morning if two sequential systolic BP readings (top number) within 15 minutes are less than 100. 03/09/19   Munley, Hilton Cork, MD  omeprazole (PRILOSEC) 20 MG capsule  Take 20 mg by mouth daily.  04/26/14   [provider]  OnabotulinumtoxinA (BOTOX IJ) Inject as directed every 3 (three) months. Next injection due in December 2019    [provider]  potassium chloride SA (K-DUR,KLOR-CON) 20 MEQ tablet Take 20 mEq by mouth daily.  08/04/18   [provider]  pravastatin (PRAVACHOL) 40 MG tablet Take 40 mg by mouth at bedtime.  05/07/14   [provider]  PRESCRIPTION MEDICATION Inhale into the lungs at bedtime. CPAP    [provider]  Probiotic Product (PROBIOTIC DAILY PO) Take 1 tablet by mouth at bedtime.     [provider]  psyllium (METAMUCIL) 58.6 % packet Take 1 packet by mouth 2 (two) times daily.     [provider]  sodium chloride 1 g tablet  Take 1 tablet (1 g total) by mouth daily. 06/18/18   Richardo Priest, MD  temazepam (RESTORIL) 7.5 MG capsule Take 1 capsule (7.5 mg total) by mouth at bedtime as needed for sleep. 07/26/18   Quintella Reichert, MD   Physical Exam: Blood pressure (!) 156/108, pulse 88, temperature 97.8 F (36.6 C), temperature source Oral, resp. rate (!) 22, height 6' (1.829 m), weight 79.5 kg, SpO2 97 %. 1. General:  in No  Acute distress   Chronically ill -appearing 2. Psychological: Alert and   Oriented to self 3. Head/ENT:   Dry Mucous Membranes                          Head   Traumatic abrasion to the left side of temple, neck supple                            Poor Dentition 4. SKIN:   decreased Skin turgor,  Skin clean Dry and intact no rash 5. Heart: Regular rate and rhythm no Murmur, no Rub or gallop 6. Lungs:  no wheezes or crackles   7. Abdomen: Soft,   Non distended bowel sounds present 8. Lower extremities: no clubbing, cyanosis, no  edema 9. Neurologically Grossly intact, moving all 4 extremities equally  10. MSK: Normal range of motion   All other LABS:     Recent Labs  Lab 06/17/19 2035  WBC 5.4  NEUTROABS 3.5  HGB 13.5  HCT 40.4  MCV 83.0   PLT 200     Recent Labs  Lab 06/17/19 2035  NA 127*  K 4.4  CL 93*  CO2 25  GLUCOSE 104*  BUN 12  CREATININE 0.99  CALCIUM 8.6*     No results for input(s): AST, ALT, ALKPHOS, BILITOT, PROT, ALBUMIN in the last 168 hours.     Cultures:    Component Value Date/Time   SDES BLOOD LEFT WRIST 01/27/2017 0100   SPECREQUEST  01/27/2017 0100    BOTTLES DRAWN AEROBIC AND ANAEROBIC Blood Culture adequate volume   CULT NO GROWTH 5 DAYS 01/27/2017 0100   REPTSTATUS 02/01/2017 FINAL 01/27/2017 0100     Radiological Exams on Admission: CT Head Wo Contrast  Result Date: 06/17/2019 CLINICAL DATA:  Syncopal episode. EXAM: CT HEAD WITHOUT CONTRAST CT CERVICAL SPINE WITHOUT CONTRAST TECHNIQUE: Multidetector CT imaging of the head and cervical spine was performed following the standard protocol without intravenous contrast. Multiplanar CT image reconstructions of the cervical spine were also generated. COMPARISON:  Head CT October 02, 2005 MRI of the head January 20, 2017 FINDINGS: CT HEAD FINDINGS Brain: There are 2 small foci of acute intraparenchymal hemorrhage in the left frontal lobe, measuring 9 mm, and 6 mm. There is no midline shift or hydrocephalus. No acute transcortical infarct is identified. Vascular: Atherosclerosis and views arterial megaly is identified. 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 Skull: No acute abnormality. Sinuses/Orbits: No acute finding. Other: None. CT CERVICAL SPINE FINDINGS Alignment: There is straightening of cervical spine either due to muscle spasm or positioning. Skull base and vertebrae: No acute fracture. No primary bone lesion or focal pathologic process. Soft tissues and spinal canal: No prevertebral fluid or swelling. No visible canal hematoma. Disc levels: Degenerative joint changes of cervical spine with narrowed joint space and osteophyte formation are identified, more marked  in the mid  to lower cervical spine. Upper chest: Emphysematous changes of the lung apices are noted. Other: None. IMPRESSION: 1. There are 2 small foci of acute intraparenchymal hemorrhage in the left frontal lobe, measuring 9 mm, and 6 mm. 2. No acute fracture or dislocation of cervical spine. 3. 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 These results were called by telephone at the time of interpretation on 06/17/2019 at 9:09 pm to provider Fraser Din, who verbally acknowledged these results. Electronically Signed   By: Abelardo Diesel M.D.   On: 06/17/2019 21:12   CT Cervical Spine Wo Contrast  Result Date: 06/17/2019 CLINICAL DATA:  Syncopal episode. EXAM: CT HEAD WITHOUT CONTRAST CT CERVICAL SPINE WITHOUT CONTRAST TECHNIQUE: Multidetector CT imaging of the head and cervical spine was performed following the standard protocol without intravenous contrast. Multiplanar CT image reconstructions of the cervical spine were also generated. COMPARISON:  Head CT October 02, 2005 MRI of the head January 20, 2017 FINDINGS: CT HEAD FINDINGS Brain: There are 2 small foci of acute intraparenchymal hemorrhage in the left frontal lobe, measuring 9 mm, and 6 mm. There is no midline shift or hydrocephalus. No acute transcortical infarct is identified. Vascular: Atherosclerosis and views arterial megaly is identified. 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 Skull: No acute abnormality. Sinuses/Orbits: No acute finding. Other: None. CT CERVICAL SPINE FINDINGS Alignment: There is straightening of cervical spine either due to muscle spasm or positioning. Skull base and vertebrae: No acute fracture. No primary bone lesion or focal pathologic process. Soft tissues and spinal canal: No prevertebral fluid or swelling. No visible canal hematoma. Disc levels: Degenerative joint changes of cervical spine with narrowed joint space and  osteophyte formation are identified, more marked in the mid to lower cervical spine. Upper chest: Emphysematous changes of the lung apices are noted. Other: None. IMPRESSION: 1. There are 2 small foci of acute intraparenchymal hemorrhage in the left frontal lobe, measuring 9 mm, and 6 mm. 2. No acute fracture or dislocation of cervical spine. 3. 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 These results were called by telephone at the time of interpretation on 06/17/2019 at 9:09 pm to provider Fraser Din, who verbally acknowledged these results. Electronically Signed   By: Abelardo Diesel M.D.   On: 06/17/2019 21:12    Chart has been reviewed    Assessment/Plan   85 y.o. male with medical history significant of paroxysmal atrial fibrillation, sick sinus syndrome, orthostatic hypotension Parkinson disease with autonomic dysfunction, pseudogout, subdural hematoma, hypertension difficult to manage secondary to intermittent orthostatic hypotension, history of frequent falls.  Sleep apnea.  Prostate cancer and colon cancer.  History of stroke Admitted for syncope and intraparenchimal brain bleeding due to a fall  Present on Admission:  Intraparenchymal hematoma of brain due to trauma [facility]) -admit for neuro checks,  appreciate neurosurgery consult patient not a good candidate for surgical intervention Repeat CT in a.m. if no significant changes PT OT assessment    Syncope -in the setting of head trauma.  Most likely according to history patient  got up from the bed wanting to use the bathroom his walker was too far away he had a mechanical fall that resulted in LOC and intraparenchymal bleeding Advanced age we will monitor on telemetry serial cardiac enzymes and monitor repeat EKG.  Obtain echo for completion   Parkinson disease ([place]) -chronic with orthostatic hypotension.  Patient takes clonidine for when  blood pressure becomes too high above 180 and midodrine when becomes too low below  100.    Hyponatremia-chronic but somewhat worse than baseline patient already taking sodium tablets.  We will continue Obtain urine electrolytes attempt to gently rehydrate   Essential hypertension -clonidine only as needed for systolic blood pressure below 180   Fall will have PT OT assessment prior to discharge   Orthostatic hypotension continue midodrine as needed systolic blood pressures below 100   PAF (paroxysmal atrial fibrillation) (HCC) currently not on any rate control medications not a candidate for anticoagulation secondary to repeated falls history of GI bleed as well as multiple subdural hematomas   OSA (obstructive sleep apnea) - hold off on CPAP until COVID test resulted  Other plan as per orders.  DVT prophylaxis:  SCD    Code Status:    DNR/DNI   as per  family  I had personally discussed CODE STATUS with   Family  Overall guarded prognosis   Family Communication:   Family  at  Bedside  plan of care was discussed on the phone with  Wife,    Disposition Plan:     To home once workup is complete and patient is stable                     Would benefit from PT/OT eval prior to DC  Ordered                   Swallow eval - SLP ordered                                      Consults called:   Neurosurgery is aware  Admission status:  ED Disposition    None      Obs   Level of care       SDU tele indefinitely please discontinue once patient no longer qualifies   Precautions: asymptomatic screening protocol  No active isolations    PPE: Used by the provider:   P100  eye Goggles,  Gloves    Afra Tricarico 06/17/2019, 11:47 PM    [facility]     after 2 AM please page floor coverage PA If 7AM-7PM, please contact the day team taking care of the patient using [email or web address]

## 2019-06-17 NOTE — ED Provider Notes (Signed)
[facility] EMERGENCY DEPARTMENT Provider Note   CSN: [id number] Arrival date & time: 06/17/19  1936     History Chief Complaint  Patient presents with  . Fall  . Loss of Consciousness    Demarcus Whitus is a 85 y.o. male past with history of subdural hematoma, hypertension, hyperlipidemia, GI bleed who presents for evaluation of fall that occurred about an hour prior to ED arrival.  Per wife, patient was at home when he tried to get up by himself and fell.  She reports that she was in the next room and heard him fall.  She reports positive LOC of a few minutes.  She reports that when he woke up, he was back to baseline.  He does have a history of subdural hematomas after fall so she wanted to get it checked out.  He is currently not complaining of any pain.  He is not currently on any blood thinners.  He does not recall the events surrounding the fall or why he fell.  The history is provided by the patient.       Past Medical History:  Diagnosis Date  . Awareness alteration, transient 11/02/2014  . Cellulitis of right hand 01/27/2017  . Colon cancer ([place])   . Essential hypertension 09/23/2012  . Fall 09/16/2016  . Fall from standing 02/25/2017  . GI bleed   . Hyperlipidemia   . Hypertension   . Insomnia 08/13/2016  . Normocytic anemia 01/27/2017  . On amiodarone therapy 08/06/2015  . Orthostatic hypotension 08/06/2015  . OSA (obstructive sleep apnea)   . PAF (paroxysmal atrial fibrillation) ([place])   . Parkinson disease ([place])   . Prostate cancer ([place])   . SDH (subdural hematoma) ([place]) 09/16/2016  . Status post placement of implantable loop recorder 07/11/2015  . Stroke ([place])   . 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 Subdural hematoma ([place]) 09/05/2016  . Syncope 11/02/2014  . TIA (transient ischemic attack)   . Vitamin D deficiency     Patient Active Problem List   Diagnosis Date Noted  . Intraparenchymal hematoma of brain due to trauma ([place]) 06/17/2019  . Closed right hip fracture ([place]) 11/09/2018  . Sick sinus syndrome ([place]) 09/16/2018  . Bradycardia 05/14/2018  . Fall from standing 02/25/2017  . Parkinson disease ([place]) 01/27/2017  . Hyponatremia 01/27/2017  . Hypokalemia 01/27/2017  . Cellulitis of right hand 01/27/2017  . Normocytic anemia 01/27/2017  . Fall 09/16/2016  . SDH (subdural hematoma) ([place]) 09/16/2016  . Subdural hematoma ([place]) 09/05/2016  . Insomnia 08/13/2016  . On amiodarone therapy 08/06/2015  . Orthostatic hypotension 08/06/2015  . PAF (paroxysmal atrial fibrillation) ([place]) 08/06/2015  . Status post placement of implantable loop recorder 07/11/2015  . 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 Syncope 11/02/2014  . Awareness alteration, transient 11/02/2014  . OSA (obstructive sleep apnea) 05/31/2014  . Essential hypertension 09/23/2012  . Prostate cancer ([place]) 09/23/2012    Past Surgical History:  Procedure Laterality Date  . COLECTOMY    . HERNIA REPAIR    . ORCHIECTOMY    . PROSTATECTOMY    . TOTAL HIP ARTHROPLASTY Right 11/11/2018   Procedure: TOTAL HIP ARTHROPLASTY ANTERIOR APPROACH;  Surgeon: Gaynelle Arabian, MD;  Location: [facility] ORS;  Service: Orthopedics;  Laterality: Right;  100       Family History  Problem Relation Age of Onset  .  Parkinson's disease Mother   . Heart attack Father   . Heart attack Brother     Social History   Tobacco Use  . Smoking status: Former Smoker    Packs/day: 2.00    Years: 20.00    Pack years: 40.00    Types: Cigarettes    Quit date: 07/08/1958    Years since quitting: 60.9  . Smokeless tobacco: Never Used  Substance Use Topics  . Alcohol use: Yes    Alcohol/week: 0.0 standard drinks    Comment: occasional - 1-2 drinks a month  . Drug use: No    Home Medications Prior to Admission medications   Medication Sig Start  Date End Date Taking? Authorizing Provider  Cholecalciferol (VITAMIN D3 PO) Take 5,000 mg by mouth daily.    [provider]  cloNIDine (CATAPRES) 0.1 MG tablet Take 1 tablet daily as needed for systolic (top number) blood pressure greater than 180 02/19/19   Munley, Hilton Cork, MD  cyanocobalamin (,VITAMIN B-12,) 1000 MCG/ML injection Inject 1,000 mcg into the muscle every 30 (thirty) days.     [provider]  EPINEPHrine 0.3 mg/0.3 mL IJ SOAJ injection Inject 0.3 mg into the muscle daily as needed (allergic reaction).    [provider]  ferrous sulfate 325 (65 FE) MG tablet Take 325 mg by mouth 3 (three) times daily with meals.     [provider]  ipratropium (ATROVENT) 0.03 % nasal spray Place 1 spray into the nose 2 (two) times daily. 08/20/17   [provider]  loratadine (CLARITIN) 10 MG tablet Take 10 mg by mouth at bedtime.     [provider]  magnesium oxide (MAG-OX) 400 MG tablet Take 800 mg by mouth 2 (two) times daily.     [provider]  Melatonin 5 MG TABS Take 5 mg by mouth at bedtime as needed.    [provider]  midodrine (PROAMATINE) 2.5 MG tablet Take 1 tablet daily in the morning if two sequential systolic BP readings (top number) within 15 minutes are less than 100. 03/09/19   Munley, Hilton Cork, MD  omeprazole (PRILOSEC) 20 MG capsule Take 20 mg by mouth daily.  04/26/14   [provider]  OnabotulinumtoxinA (BOTOX IJ) Inject as directed every 3 (three) months. Next injection due in December 2019    [provider]  potassium chloride SA (K-DUR,KLOR-CON) 20 MEQ tablet Take 20 mEq by mouth daily.  08/04/18   [provider]  pravastatin (PRAVACHOL) 40 MG tablet Take 40 mg by mouth at bedtime.  05/07/14   [provider]  PRESCRIPTION MEDICATION Inhale into the lungs at bedtime. CPAP    [provider]  Probiotic Product (PROBIOTIC DAILY PO) Take 1 tablet by mouth at  bedtime.     [provider]  psyllium (METAMUCIL) 58.6 % packet Take 1 packet by mouth 2 (two) times daily.     [provider]  sodium chloride 1 g tablet Take 1 tablet (1 g total) by mouth daily. 06/18/18   Richardo Priest, MD  temazepam (RESTORIL) 7.5 MG capsule Take 1 capsule (7.5 mg total) by mouth at bedtime as needed for sleep. 07/26/18   Quintella Reichert, MD    Allergies    Aspirin, Augmentin [amoxicillin-pot clavulanate], Hemocyte plus [hematinic plus vit-minerals], and Penicillins  Review of Systems   Review of Systems  Unable to perform ROS: Mental status change    Physical Exam Updated Vital Signs BP (!) 156/108 (  BP Location: Right Arm)   Pulse 88   Temp 97.8 F (36.6 C) (Oral)   Resp (!) 22   Ht 6' (1.829 m)   Wt 79.5 kg   SpO2 97%   BMI 23.77 kg/m   Physical Exam Vitals and nursing note reviewed.  Constitutional:      Appearance: Normal appearance. He is well-developed.  HENT:     Head: Normocephalic and atraumatic.      Comments: Abrasion noted to left forehead.  No skull deformity or crepitus noted. Eyes:     General: Lids are normal.     Conjunctiva/sclera: Conjunctivae normal.     Pupils: Pupils are equal, round, and reactive to light.     Comments: PERRL. EOMs intact. No nystagmus. No neglect.   Neck:     Comments: C-collar in place. Cardiovascular:     Rate and Rhythm: Normal rate and regular rhythm.     Pulses: Normal pulses.     Heart sounds: Normal heart sounds. No murmur. No friction rub. No gallop.   Pulmonary:     Effort: Pulmonary effort is normal.     Breath sounds: Normal breath sounds.  Abdominal:     Palpations: Abdomen is soft. Abdomen is not rigid.     Tenderness: There is no abdominal tenderness. There is no guarding.     Comments: Lungs clear to auscultation bilaterally.  Symmetric chest rise.  No wheezing, rales, rhonchi.  Musculoskeletal:        General: Normal range of motion.     Cervical back: Full passive  range of motion without pain.  Skin:    General: Skin is warm and dry.     Capillary Refill: Capillary refill takes less than 2 seconds.  Neurological:     Mental Status: He is alert.     Comments: Alert and oriented x2.  He knows who he is and where he is at but cannot tell me the year.  Follows commands. Cranial nerves III-XII intact Follows commands, Moves all extremities  5/5 strength to BUE and BLE  Sensation intact throughout all major nerve distributions No slurred speech. No facial droop.   Psychiatric:        Speech: Speech normal.     ED Results / Procedures / Treatments   Labs (all labs ordered are listed, but only abnormal results are displayed) Labs Reviewed  BASIC METABOLIC PANEL - Abnormal; Notable for the following components:      Result Value   Sodium 127 (*)    Chloride 93 (*)    Glucose, Bld 104 (*)    Calcium 8.6 (*)    All other components within normal limits  SARS CORONAVIRUS 2 (TAT 6-24 HRS)  CBC WITH DIFFERENTIAL/PLATELET  URINALYSIS, ROUTINE W REFLEX MICROSCOPIC  TROPONIN I (HIGH SENSITIVITY)    EKG None   EKG shows sinus rhythm, rate 90.  QTC is 492.  Unfortunately, this cannot cross over in epic but is reported in his media section.  Radiology CT Head Wo Contrast  Result Date: 06/17/2019 CLINICAL DATA:  Syncopal episode. EXAM: CT HEAD WITHOUT CONTRAST CT CERVICAL SPINE WITHOUT CONTRAST TECHNIQUE: Multidetector CT imaging of the head and cervical spine was performed following the standard protocol without intravenous contrast. Multiplanar CT image reconstructions of the cervical spine were also generated. COMPARISON:  Head CT October 02, 2005 MRI of the head January 20, 2017 FINDINGS: CT HEAD FINDINGS Brain: There are 2 small foci of acute intraparenchymal hemorrhage in the left frontal  lobe, measuring 9 mm, and 6 mm. There is no midline shift or hydrocephalus. No acute transcortical infarct is identified. Vascular: Atherosclerosis and views arterial  megaly is identified. 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 Skull: No acute abnormality. Sinuses/Orbits: No acute finding. Other: None. CT CERVICAL SPINE FINDINGS Alignment: There is straightening of cervical spine either due to muscle spasm or positioning. Skull base and vertebrae: No acute fracture. No primary bone lesion or focal pathologic process. Soft tissues and spinal canal: No prevertebral fluid or swelling. No visible canal hematoma. Disc levels: Degenerative joint changes of cervical spine with narrowed joint space and osteophyte formation are identified, more marked in the mid to lower cervical spine. Upper chest: Emphysematous changes of the lung apices are noted. Other: None. IMPRESSION: 1. There are 2 small foci of acute intraparenchymal hemorrhage in the left frontal lobe, measuring 9 mm, and 6 mm. 2. No acute fracture or dislocation of cervical spine. 3. 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 These results were called by telephone at the time of interpretation on 06/17/2019 at 9:09 pm to provider Fraser Din, who verbally acknowledged these results. Electronically Signed   By: Abelardo Diesel M.D.   On: 06/17/2019 21:12   CT Cervical Spine Wo Contrast  Result Date: 06/17/2019 CLINICAL DATA:  Syncopal episode. EXAM: CT HEAD WITHOUT CONTRAST CT CERVICAL SPINE WITHOUT CONTRAST TECHNIQUE: Multidetector CT imaging of the head and cervical spine was performed following the standard protocol without intravenous contrast. Multiplanar CT image reconstructions of the cervical spine were also generated. COMPARISON:  Head CT October 02, 2005 MRI of the head January 20, 2017 FINDINGS: CT HEAD FINDINGS Brain: There are 2 small foci of acute intraparenchymal hemorrhage in the left frontal lobe, measuring 9 mm, and 6 mm. There is no midline shift or hydrocephalus. No acute transcortical infarct is identified.  Vascular: Atherosclerosis and views arterial megaly is identified. 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 Skull: No acute abnormality. Sinuses/Orbits: No acute finding. Other: None. CT CERVICAL SPINE FINDINGS Alignment: There is straightening of cervical spine either due to muscle spasm or positioning. Skull base and vertebrae: No acute fracture. No primary bone lesion or focal pathologic process. Soft tissues and spinal canal: No prevertebral fluid or swelling. No visible canal hematoma. Disc levels: Degenerative joint changes of cervical spine with narrowed joint space and osteophyte formation are identified, more marked in the mid to lower cervical spine. Upper chest: Emphysematous changes of the lung apices are noted. Other: None. IMPRESSION: 1. There are 2 small foci of acute intraparenchymal hemorrhage in the left frontal lobe, measuring 9 mm, and 6 mm. 2. No acute fracture or dislocation of cervical spine. 3. 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 These results were called by telephone at the time of interpretation on 06/17/2019 at 9:09 pm to provider Fraser Din, who verbally acknowledged these results. Electronically Signed   By: Abelardo Diesel M.D.   On: 06/17/2019 21:12    Procedures Procedures (including critical care time)  Medications Ordered in ED Medications - No data to display  ED Course  I have reviewed the triage vital signs and the nursing notes.  Pertinent labs & imaging results that were available during my care of the patient were reviewed by me and considered in my medical decision making (see chart for details).  MDM Rules/Calculators/A&P       85 year old male who presents for evaluation after a fall that occurred just prior to ED arrival.  Wife was in the next room.  She states that patient got up to walk by himself and fell.  She does report he has had to have  positive LOC.  He is not currently on blood thinners. Patient is afebrile, non-toxic appearing, sitting comfortably on examination table. Vital signs reviewed and stable.  No neuro deficits on exam.  He does have abrasion to the forehead.  Given his age and slight confusion, will plan for CT head, CT C-spine.  Additionally, plan for EKG, UA, blood work.  BMP shows sodium of 127.  BUN and creatinine within normal limits.  CBC shows no leukocytosis or anemia.  CT head shows 2 small foci of acute intraparenchymal hemorrhages in the left frontal lobe measuring approximate 9 mm and 6 mm.  Discussed patient with Dr. Arnoldo Morale (Neurosurgery).  No intervention given age and the fact that he is not a good surgical candidate.  Agrees with plan for admission.  They will plan to consult tomorrow.   Discussed patient with Dr. Roel Cluck (hospitalist) who agrees for admission.   Portions of this note were generated with [employer]. Dictation errors may occur despite best attempts at proofreading.   Final Clinical Impression(s) / ED Diagnoses Final diagnoses:  Fall, initial encounter  Intraparenchymal hematoma of brain, left, with loss of consciousness, initial encounter [facility])    Rx / DC Orders ED Discharge Orders    None       Desma Mcgregor 06/17/19 2259    Tegeler, Gwenyth Allegra, MD 06/17/19 [phone]

## 2019-06-18 ENCOUNTER — Observation Stay (HOSPITAL_COMMUNITY): Payer: Medicare Other

## 2019-06-18 ENCOUNTER — Other Ambulatory Visit: Payer: Self-pay

## 2019-06-18 ENCOUNTER — Observation Stay (HOSPITAL_BASED_OUTPATIENT_CLINIC_OR_DEPARTMENT_OTHER): Payer: Medicare Other

## 2019-06-18 ENCOUNTER — Encounter (HOSPITAL_COMMUNITY): Payer: Self-pay | Admitting: Internal Medicine

## 2019-06-18 DIAGNOSIS — S06351A Traumatic hemorrhage of left cerebrum with loss of consciousness of 30 minutes or less, initial encounter: Secondary | ICD-10-CM | POA: Diagnosis not present

## 2019-06-18 DIAGNOSIS — I351 Nonrheumatic aortic (valve) insufficiency: Secondary | ICD-10-CM | POA: Diagnosis not present

## 2019-06-18 DIAGNOSIS — G20 Parkinson's disease: Secondary | ICD-10-CM

## 2019-06-18 DIAGNOSIS — S06361A Traumatic hemorrhage of cerebrum, unspecified, with loss of consciousness of 30 minutes or less, initial encounter: Secondary | ICD-10-CM

## 2019-06-18 DIAGNOSIS — I480 Paroxysmal atrial fibrillation: Secondary | ICD-10-CM | POA: Diagnosis not present

## 2019-06-18 DIAGNOSIS — W19XXXA Unspecified fall, initial encounter: Secondary | ICD-10-CM

## 2019-06-18 DIAGNOSIS — G4733 Obstructive sleep apnea (adult) (pediatric): Secondary | ICD-10-CM

## 2019-06-18 DIAGNOSIS — E871 Hypo-osmolality and hyponatremia: Secondary | ICD-10-CM | POA: Diagnosis not present

## 2019-06-18 DIAGNOSIS — Z9181 History of falling: Secondary | ICD-10-CM | POA: Diagnosis not present

## 2019-06-18 DIAGNOSIS — R55 Syncope and collapse: Secondary | ICD-10-CM

## 2019-06-18 DIAGNOSIS — I951 Orthostatic hypotension: Secondary | ICD-10-CM

## 2019-06-18 DIAGNOSIS — I10 Essential (primary) hypertension: Secondary | ICD-10-CM | POA: Diagnosis not present

## 2019-06-18 DIAGNOSIS — R479 Unspecified speech disturbances: Secondary | ICD-10-CM | POA: Diagnosis not present

## 2019-06-18 LAB — ECHOCARDIOGRAM COMPLETE
Height: 72 in
Weight: 2804.25 oz

## 2019-06-18 LAB — URINALYSIS, ROUTINE W REFLEX MICROSCOPIC
Bilirubin Urine: NEGATIVE
Glucose, UA: NEGATIVE mg/dL
Hgb urine dipstick: NEGATIVE
Ketones, ur: NEGATIVE mg/dL
Leukocytes,Ua: NEGATIVE
Nitrite: NEGATIVE
Protein, ur: NEGATIVE mg/dL
Specific Gravity, Urine: 1.01 (ref 1.005–1.030)
pH: 7 (ref 5.0–8.0)

## 2019-06-18 LAB — COMPREHENSIVE METABOLIC PANEL
ALT: 15 U/L (ref 0–44)
AST: 18 U/L (ref 15–41)
Albumin: 3.7 g/dL (ref 3.5–5.0)
Alkaline Phosphatase: 63 U/L (ref 38–126)
Anion gap: 8 (ref 5–15)
BUN: 12 mg/dL (ref 8–23)
CO2: 26 mmol/L (ref 22–32)
Calcium: 8.8 mg/dL — ABNORMAL LOW (ref 8.9–10.3)
Chloride: 96 mmol/L — ABNORMAL LOW (ref 98–111)
Creatinine, Ser: 0.86 mg/dL (ref 0.61–1.24)
GFR calc Af Amer: >60 mL/min (ref >60)
GFR calc non Af Amer: >60 mL/min (ref >60)
Glucose, Bld: 101 mg/dL — ABNORMAL HIGH (ref 70–99)
Potassium: 4.4 mmol/L (ref 3.5–5.1)
Sodium: 130 mmol/L — ABNORMAL LOW (ref 135–145)
Total Bilirubin: 1.2 mg/dL (ref 0.3–1.2)
Total Protein: 6.7 g/dL (ref 6.5–8.1)

## 2019-06-18 LAB — CBC
HCT: 41.8 % (ref 39.0–52.0)
Hemoglobin: 14 g/dL (ref 13.0–17.0)
MCH: 27.8 pg (ref 26.0–34.0)
MCHC: 33.5 g/dL (ref 30.0–36.0)
MCV: 83.1 fL (ref 80.0–100.0)
Platelets: 192 10*3/uL (ref 150–400)
RBC: 5.03 MIL/uL (ref 4.22–5.81)
RDW: 14.6 % (ref 11.5–15.5)
WBC: 5.5 10*3/uL (ref 4.0–10.5)
nRBC: 0 % (ref 0.0–0.2)

## 2019-06-18 LAB — CREATININE, URINE, RANDOM: Creatinine, Urine: 50.68 mg/dL

## 2019-06-18 LAB — TROPONIN I (HIGH SENSITIVITY)
Troponin I (High Sensitivity): 15 ng/L (ref <18)
Troponin I (High Sensitivity): 14 ng/L (ref <18)

## 2019-06-18 LAB — PHOSPHORUS: Phosphorus: 3 mg/dL (ref 2.5–4.6)

## 2019-06-18 LAB — SODIUM, URINE, RANDOM: Sodium, Ur: 105 mmol/L

## 2019-06-18 LAB — MAGNESIUM: Magnesium: 1.9 mg/dL (ref 1.7–2.4)

## 2019-06-18 LAB — SARS CORONAVIRUS 2 (TAT 6-24 HRS): SARS Coronavirus 2: NEGATIVE

## 2019-06-18 LAB — TSH: TSH: 1.098 u[IU]/mL (ref 0.350–4.500)

## 2019-06-18 LAB — OSMOLALITY, URINE: Osmolality, Ur: 389 mOsm/kg (ref 300–900)

## 2019-06-18 MED ORDER — ACETAMINOPHEN 650 MG RE SUPP: 650.0000 mg | Freq: Four times a day (QID) | RECTAL | Status: DC | PRN

## 2019-06-18 MED ORDER — LORATADINE 10 MG PO TABS
10.0000 mg | ORAL_TABLET | Freq: Every day | ORAL | Status: DC
  Administered 2019-06-18: 02:00:00 10 mg via ORAL
  Filled 2019-06-18: qty 1

## 2019-06-18 MED ORDER — PANTOPRAZOLE SODIUM 40 MG PO TBEC
40.0000 mg | DELAYED_RELEASE_TABLET | Freq: Every day | ORAL | Status: DC
  Administered 2019-06-18: 40 mg via ORAL
  Filled 2019-06-18: qty 1

## 2019-06-18 MED ORDER — MIDODRINE HCL 5 MG PO TABS: 2.5000 mg | ORAL_TABLET | Freq: Two times a day (BID) | ORAL | Status: DC | PRN

## 2019-06-18 MED ORDER — CLONIDINE HCL 0.1 MG PO TABS: 0.1000 mg | ORAL_TABLET | Freq: Two times a day (BID) | ORAL | Status: DC | PRN

## 2019-06-18 MED ORDER — ONDANSETRON HCL 4 MG PO TABS: 4.0000 mg | ORAL_TABLET | Freq: Four times a day (QID) | ORAL | Status: DC | PRN

## 2019-06-18 MED ORDER — SODIUM CHLORIDE 0.9 % IV SOLN
INTRAVENOUS | Status: AC
  Administered 2019-06-18: 01:00:00 via INTRAVENOUS

## 2019-06-18 MED ORDER — PRAVASTATIN SODIUM 40 MG PO TABS
40.0000 mg | ORAL_TABLET | Freq: Every day | ORAL | Status: DC
  Administered 2019-06-18: 40 mg via ORAL
  Filled 2019-06-18: qty 1

## 2019-06-18 MED ORDER — ACETAMINOPHEN 325 MG PO TABS: 650.0000 mg | ORAL_TABLET | Freq: Four times a day (QID) | ORAL | Status: DC | PRN

## 2019-06-18 MED ORDER — ONDANSETRON HCL 4 MG/2ML IJ SOLN: 4.0000 mg | Freq: Four times a day (QID) | INTRAMUSCULAR | Status: DC | PRN

## 2019-06-18 MED ORDER — SODIUM CHLORIDE 1 G PO TABS
1.0000 g | ORAL_TABLET | Freq: Every day | ORAL | Status: DC
  Administered 2019-06-18: 1 g via ORAL
  Filled 2019-06-18: qty 1

## 2019-06-18 NOTE — Discharge Instructions (Signed)
Head Injury, Adult There are many types of head injuries. They can be as minor as a bump. Some head injuries can be worse. Worse injuries include:  A strong hit to the head that shakes the brain back and forth causing damage (concussion).  A bruise (contusion) of the brain. This means there is bleeding in the brain that can cause swelling.  A cracked skull (skull fracture).  Bleeding in the brain that gathers, gets thick (makes a clot), and forms a bump (hematoma). Most problems from a head injury come in the first 24 hours. However, you may still have side effects up to 7-10 days after your injury. It is important to watch your condition for any changes. You may need to be watched in the emergency department or urgent care, or you may need to stay in the hospital. What are the causes? There are many possible causes of a head injury. A serious head injury may be caused by:  A car accident.  Bicycle or motorcycle accidents.  Sports injuries.  Falls. What are the signs or symptoms? Symptoms of a head injury include a bruise, bump, or bleeding where the injury happened. Other physical symptoms may include:  Headache.  Feeling sick to your stomach (nauseous) or vomiting.  Dizziness.  Feeling tired.  Being uncomfortable around bright lights or loud noises.  Shaking movements that you cannot control (seizures).  Trouble being woken up.  Passing out (fainting). Mental or emotional symptoms may include:  Feeling grumpy or cranky.  Confusion and memory problems.  Having trouble paying attention or concentrating.  Changes in eating or sleeping habits.  Feeling worried or nervous (anxious).  Feeling sad (depressed). How is this treated? Treatment for this condition depends on how severe the injury is and the type of injury you have. The main goal is to prevent complications and to allow the brain time to heal. Mild head injury If you have a mild head injury, you may be  sent home and treatment may include:  Being watched. A responsible adult should stay with you for 24 hours after your injury and check on you often.  Physical rest.  Brain rest.  Pain medicines. Severe head injury If you have a severe head injury, treatment may include:  Being watched closely. This includes hospitalization with frequent physical exams.  Medicines to: ? Help with pain. ? Prevent shaking movements that you cannot control. ? Help with brain swelling.  Using a machine that helps you breathe (ventilator).  Treatments to manage the swelling inside the brain.  Brain surgery. This may be needed to: ? Remove a blood clot. ? Stop the bleeding. ? Remove a part of the skull. This allows room for the brain to swell. Follow these instructions at home: Activity  Rest.  Avoid activities that are hard or tiring.  Make sure you get enough sleep.  Limit activities that need a lot of thought or attention, such as: ? Watching TV. ? Playing memory games and puzzles. ? Job-related work or homework. ? Working on [employer], and texting.  Avoid activities that could cause another head injury until your doctor says it is okay. This includes playing sports. Having another head injury, especially before the first one has healed, can be dangerous.  Ask your doctor when it is safe for you to go back to your normal activities, such as work or school. Ask your doctor for a step-by-step plan for slowly going back to your normal activities.  Ask  your doctor when you can drive, ride a bicycle, or use heavy machinery. Do not do these activities if you are dizzy. Lifestyle   Do not drink alcohol until your doctor says it is okay.  Do not use drugs.  If it is harder than usual to remember things, write them down.  If you are easily distracted, try to do one thing at a time.  Talk with family members or close friends when making important decisions.  Tell your  friends, family, a trusted coworker, and work [employer] about your injury, symptoms, and limits (restrictions). Have them watch for any problems that are new or getting worse. General instructions  Take over-the-counter and prescription medicines only as told by your doctor.  Have someone stay with you for 24 hours after your head injury. This person should watch you for any changes in your symptoms and be ready to get help.  Keep all follow-up visits as told by your doctor. This is important. How is this prevented?  Work on [employer]. This can help you avoid falls.  Wear a seatbelt when you are in a moving vehicle.  Wear a helmet when you: ? Ride a bicycle. ? Ski. ? Do any other sport or activity that has a risk of injury.  If you drink alcohol: ? Limit how much you use to: ? 0-1 drink a day for women. ? 0-2 drinks a day for men. ? Be aware of how much alcohol is in your drink. In the [place] one drink equals one 12 oz bottle of beer (355 mL), one 5 oz glass of wine (148 mL), or one 1 oz glass of hard liquor (44 mL).  Make your home safer by: ? Getting rid of clutter from the floors and stairs. This includes things that can make you trip. ? Using grab bars in bathrooms and handrails by stairs. ? Placing non-slip mats on floors and in bathtubs. ? Putting more light in dim areas. Get help right away if:  You have: ? A very bad headache that is not helped by medicine. ? Trouble walking or weakness in your arms and legs. ? Clear or bloody fluid coming from your nose or ears. ? Changes in how you see (vision). ? Shaking movements that you cannot control.  You lose your balance.  You vomit.  The black centers of your eyes (pupils) change in size.  Your speech is slurred.  Your dizziness gets worse.  You pass out.  You are sleepier than normal and have trouble staying awake.  Your symptoms get worse. These symptoms may be an emergency. Do not wait to see  if the symptoms will go away. Get medical help right away. Call your local emergency services (911 in the [place] Do not drive yourself to the hospital. Summary  There are many types of head injuries. They can be as minor as a bump. Some head injuries can be worse  Treatment for this condition depends on how severe the injury is and the type of injury you have.  Ask your doctor when it is safe for you to go back to your normal activities, such as work or school.  To prevent a head injury, wear a seat belt in a car, wear a helmet when you use a a bicycle, limit your alcohol use, and make your home safer. This information is not intended to replace advice given to you by your health care provider. Make sure you discuss any questions you  have with your health care provider. Document Released: 06/06/2008 Document Revised: 10/15/2018 Document Reviewed: 07/17/2018 Elsevier Patient Education  2020 [employer].

## 2019-06-18 NOTE — Consult Note (Signed)
Providing Compassionate, Quality Care - Together   Reason for Consult: Small intraparenchymal hemorrhage Referring Physician: Providence Lanius, PA-C (Dr. Sherry Ruffing)  Joe Glover is an 85 y.o. male.  HPI: Joe Glover has a history significant for hypertension, hyperlipidemia, prior GI bleed, and prior subdural hematoma. He attempted to get up by himself yesterday evening and fell. His wife reported a positive loss of consciousness for a few minutes. When he awoke, he was back to his baseline. He is not on any blood thinners. He denies focal weakness, dizziness, nausea, vomiting, or visual changes. His CT scan demonstrated 2 small foci of acute intraparenchymal hemorrhage in the left frontal lobe, measuring 9 mm, and 6 mm. Neurosurgery was consulted for evaluation and further recommendations.  Past Medical History:  Diagnosis Date  . Awareness alteration, transient 11/02/2014  . Cellulitis of right hand 01/27/2017  . Colon cancer ([place])   . Essential hypertension 09/23/2012  . Fall 09/16/2016  . Fall from standing 02/25/2017  . GI bleed   . Hyperlipidemia   . Hypertension   . Insomnia 08/13/2016  . Normocytic anemia 01/27/2017  . On amiodarone therapy 08/06/2015  . Orthostatic hypotension 08/06/2015  . OSA (obstructive sleep apnea)   . PAF (paroxysmal atrial fibrillation) ([place])   . Parkinson disease ([place])   . Prostate cancer ([place])   . SDH (subdural hematoma) ([place]) 09/16/2016  . Status post placement of implantable loop recorder 07/11/2015  . Stroke ([place])   . 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 Subdural hematoma ([place]) 09/05/2016  . Syncope 11/02/2014  . TIA (transient ischemic attack)   . Vitamin D deficiency     Past Surgical History:  Procedure Laterality Date  . COLECTOMY    . HERNIA REPAIR    . ORCHIECTOMY    . PROSTATECTOMY    . TOTAL HIP ARTHROPLASTY Right 11/11/2018   Procedure: TOTAL HIP  ARTHROPLASTY ANTERIOR APPROACH;  Surgeon: Gaynelle Arabian, MD;  Location: [facility];  Service: Orthopedics;  Laterality: Right;  100    Family History  Problem Relation Age of Onset  . Parkinson's disease Mother   . Heart attack Father   . Heart attack Brother     Social History:  reports that he quit smoking about 60 years ago. His smoking use included cigarettes. He has a 40.00 pack-year smoking history. He has never used smokeless tobacco. He reports current alcohol use. He reports that he does not use drugs.  Allergies:  Allergies  Allergen Reactions  . Aspirin Other (See Comments)    GI bleed  . Augmentin [Amoxicillin-Pot Clavulanate] Hives  . Hemocyte Plus [Hematinic Plus Vit-Minerals] Other (See Comments)    Unknown reaction  . Penicillins Hives    Has patient had a PCN reaction causing immediate rash, facial/tongue/throat swelling, SOB or lightheadedness with hypotension: No Has patient had a PCN reaction causing severe rash involving mucus membranes or skin necrosis: Yes Has patient had a PCN reaction that required hospitalization: No Has patient had a PCN reaction occurring within the last 10 years: No If all of the above answers are "NO", then may proceed with Cephalosporin use.    Medications: I have reviewed the patient's current medications.  Results for orders placed or performed during the hospital encounter of 06/17/19 (from the past 48 hour(s))  CBC with Differential     Status: None   Collection Time: 06/17/19  8:35 PM  Result Value  Ref Range   WBC 5.4 4.0 - 10.5 K/uL   RBC 4.87 4.22 - 5.81 MIL/uL   Hemoglobin 13.5 13.0 - 17.0 g/dL   HCT 40.4 39.0 - 52.0 %   MCV 83.0 80.0 - 100.0 fL   MCH 27.7 26.0 - 34.0 pg   MCHC 33.4 30.0 - 36.0 g/dL   RDW 14.7 11.5 - 15.5 %   Platelets 200 150 - 400 K/uL   nRBC 0.0 0.0 - 0.2 %   Neutrophils Relative % 65 %   Neutro Abs 3.5 1.7 - 7.7 K/uL   Lymphocytes Relative 22 %   Lymphs Abs 1.2 0.7 - 4.0 K/uL   Monocytes Relative  9 %   Monocytes Absolute 0.5 0.1 - 1.0 K/uL   Eosinophils Relative 3 %   Eosinophils Absolute 0.2 0.0 - 0.5 K/uL   Basophils Relative 0 %   Basophils Absolute 0.0 0.0 - 0.1 K/uL   Immature Granulocytes 1 %   Abs Immature Granulocytes 0.03 0.00 - 0.07 K/uL    Comment: Performed at [facility] [address] [place], [place] [id number]  Basic metabolic panel     Status: Abnormal   Collection Time: 06/17/19  8:35 PM  Result Value Ref Range   Sodium 127 (L) 135 - 145 mmol/L   Potassium 4.4 3.5 - 5.1 mmol/L   Chloride 93 (L) 98 - 111 mmol/L   CO2 25 22 - 32 mmol/L   Glucose, Bld 104 (H) 70 - 99 mg/dL   BUN 12 8 - 23 mg/dL   Creatinine, Ser 0.99 0.61 - 1.24 mg/dL   Calcium 8.6 (L) 8.9 - 10.3 mg/dL   GFR calc non Af Amer >60 >60 mL/min   GFR calc Af Amer >60 >60 mL/min   Anion gap 9 5 - 15    Comment: Performed at [facility], [place] [address] [place], [place] [address]  Troponin I (High Sensitivity)     Status: None   Collection Time: 06/17/19 10:38 PM  Result Value Ref Range   Troponin I (High Sensitivity) 15 <18 ng/L    Comment: (NOTE) Elevated high sensitivity troponin I (hsTnI) values and significant  changes across serial measurements may suggest ACS but many other  chronic and acute conditions are known to elevate hsTnI results.  Refer to the "Links" section for chest pain algorithms and additional  guidance. Performed at [facility], [place] [address], [place], [place] [address]   SARS CORONAVIRUS 2 (TAT 6-24 HRS) Nasopharyngeal Nasopharyngeal Swab     Status: None   Collection Time: 06/17/19 11:55 PM   Specimen: Nasopharyngeal Swab  Result Value Ref Range   SARS Coronavirus 2 NEGATIVE NEGATIVE    Comment: (NOTE) SARS-CoV-2 target nucleic acids are NOT DETECTED. The SARS-CoV-2 RNA is generally detectable in upper and lower respiratory specimens during the acute phase of infection. Negative results do not preclude SARS-CoV-2 infection, do not rule  out co-infections with other pathogens, and should not be used as the sole basis for treatment or other patient management decisions. Negative results must be combined with clinical observations, patient history, and epidemiological information. The expected result is Negative. Fact Sheet for Patients: SugarRoll.be Fact Sheet for Healthcare Providers: https://www.woods-mathews.com/ This test is not yet approved or cleared by the [place] FDA and  has been authorized for detection and/or diagnosis of SARS-CoV-2 by FDA under an Emergency Use Authorization (EUA). This EUA will remain  in effect (meaning this test can be  used) for the duration of the COVID-19 declaration under Section 56 4(b)(1) of the Act, 21 U.S.C. section 360bbb-3(b)(1), unless the authorization is terminated or revoked sooner. Performed at [facility], [place] [address] [place], [place] [address]   Troponin I (High Sensitivity)     Status: None   Collection Time: 06/18/19  2:12 AM  Result Value Ref Range   Troponin I (High Sensitivity) 14 <18 ng/L    Comment: (NOTE) Elevated high sensitivity troponin I (hsTnI) values and significant  changes across serial measurements may suggest ACS but many other  chronic and acute conditions are known to elevate hsTnI results.  Refer to the "Links" section for chest pain algorithms and additional  guidance. Performed at [facility], [place] [address], [place], [place] [address]   Magnesium     Status: None   Collection Time: 06/18/19  2:12 AM  Result Value Ref Range   Magnesium 1.9 1.7 - 2.4 mg/dL    Comment: Performed at [facility] [address] [place], [place] [address]  Phosphorus     Status: None   Collection Time: 06/18/19  2:12 AM  Result Value Ref Range   Phosphorus 3.0 2.5 - 4.6 mg/dL    Comment: Performed at [facility] [address], [place], [place] [address]  TSH     Status: None    Collection Time: 06/18/19  2:12 AM  Result Value Ref Range   TSH 1.098 0.350 - 4.500 uIU/mL    Comment: Performed by a 3rd Generation assay with a functional sensitivity of <=0.01 uIU/mL. Performed at [facility], [place] [address], [place], [place] [address]   Comprehensive metabolic panel     Status: Abnormal   Collection Time: 06/18/19  2:12 AM  Result Value Ref Range   Sodium 130 (L) 135 - 145 mmol/L   Potassium 4.4 3.5 - 5.1 mmol/L   Chloride 96 (L) 98 - 111 mmol/L   CO2 26 22 - 32 mmol/L   Glucose, Bld 101 (H) 70 - 99 mg/dL   BUN 12 8 - 23 mg/dL   Creatinine, Ser 0.86 0.61 - 1.24 mg/dL   Calcium 8.8 (L) 8.9 - 10.3 mg/dL   Total Protein 6.7 6.5 - 8.1 g/dL   Albumin 3.7 3.5 - 5.0 g/dL   AST 18 15 - 41 U/L   ALT 15 0 - 44 U/L   Alkaline Phosphatase 63 38 - 126 U/L   Total Bilirubin 1.2 0.3 - 1.2 mg/dL   GFR calc non Af Amer >60 >60 mL/min   GFR calc Af Amer >60 >60 mL/min   Anion gap 8 5 - 15    Comment: Performed at [facility], [place] [address], [place] [address]  CBC     Status: None   Collection Time: 06/18/19  2:12 AM  Result Value Ref Range   WBC 5.5 4.0 - 10.5 K/uL   RBC 5.03 4.22 - 5.81 MIL/uL   Hemoglobin 14.0 13.0 - 17.0 g/dL   HCT 41.8 39.0 - 52.0 %   MCV 83.1 80.0 - 100.0 fL   MCH 27.8 26.0 - 34.0 pg   MCHC 33.5 30.0 - 36.0 g/dL   RDW 14.6 11.5 - 15.5 %   Platelets 192 150 - 400 K/uL   nRBC 0.0 0.0 - 0.2 %    Comment: Performed at [facility], [place] [address], [place], [place] [address]  Urinalysis, Routine  w reflex microscopic     Status: None   Collection Time: 06/18/19  4:53 AM  Result Value Ref Range   Color, Urine YELLOW YELLOW   APPearance CLEAR CLEAR   Specific Gravity, Urine 1.010 1.005 - 1.030   pH 7.0 5.0 - 8.0   Glucose, UA NEGATIVE NEGATIVE mg/dL   Hgb urine dipstick NEGATIVE NEGATIVE   Bilirubin Urine NEGATIVE NEGATIVE   Ketones, ur NEGATIVE NEGATIVE mg/dL   Protein, ur NEGATIVE NEGATIVE mg/dL   Nitrite NEGATIVE  NEGATIVE   Leukocytes,Ua NEGATIVE NEGATIVE    Comment: Performed at [facility] [address] [place], [place] [address]  Creatinine, urine, random     Status: None   Collection Time: 06/18/19  4:54 AM  Result Value Ref Range   Creatinine, Urine 50.68 mg/dL    Comment: Performed at [facility] [address] [place], [place] [address]  Osmolality, urine     Status: None   Collection Time: 06/18/19  4:54 AM  Result Value Ref Range   Osmolality, Ur 389 300 - 900 mOsm/kg    Comment: Performed at [facility] [address], [place], [place] [address]  Sodium, urine, random     Status: None   Collection Time: 06/18/19  4:54 AM  Result Value Ref Range   Sodium, Ur 105 mmol/L    Comment: Performed at [facility] [address] [place], [place] [address]    CT Head Wo Contrast  Result Date: 06/17/2019 CLINICAL DATA:  Syncopal episode. EXAM: CT HEAD WITHOUT CONTRAST CT CERVICAL SPINE WITHOUT CONTRAST TECHNIQUE: Multidetector CT imaging of the head and cervical spine was performed following the standard protocol without intravenous contrast. Multiplanar CT image reconstructions of the cervical spine were also generated. COMPARISON:  Head CT October 02, 2005 MRI of the head January 20, 2017 FINDINGS: CT HEAD FINDINGS Brain: There are 2 small foci of acute intraparenchymal hemorrhage in the left frontal lobe, measuring 9 mm, and 6 mm. There is no midline shift or hydrocephalus. No acute transcortical infarct is identified. Vascular: Atherosclerosis and views arterial megaly is identified. 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 Skull: No acute abnormality. Sinuses/Orbits: No acute finding. Other: None. CT CERVICAL SPINE FINDINGS Alignment: There is straightening of cervical spine either due to muscle spasm or positioning. Skull base and vertebrae: No acute fracture. No primary bone lesion or  focal pathologic process. Soft tissues and spinal canal: No prevertebral fluid or swelling. No visible canal hematoma. Disc levels: Degenerative joint changes of cervical spine with narrowed joint space and osteophyte formation are identified, more marked in the mid to lower cervical spine. Upper chest: Emphysematous changes of the lung apices are noted. Other: None. IMPRESSION: 1. There are 2 small foci of acute intraparenchymal hemorrhage in the left frontal lobe, measuring 9 mm, and 6 mm. 2. No acute fracture or dislocation of cervical spine. 3. 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 These results were called by telephone at the time of interpretation on 06/17/2019 at 9:09 pm to provider Fraser Din, who verbally acknowledged these results. Electronically Signed   By: Abelardo Diesel M.D.   On: 06/17/2019 21:12   CT Cervical Spine Wo Contrast  Result Date: 06/17/2019 CLINICAL DATA:  Syncopal episode. EXAM: CT HEAD WITHOUT CONTRAST CT CERVICAL SPINE WITHOUT CONTRAST TECHNIQUE: Multidetector CT imaging of the head and cervical spine was performed following  the standard protocol without intravenous contrast. Multiplanar CT image reconstructions of the cervical spine were also generated. COMPARISON:  Head CT October 02, 2005 MRI of the head January 20, 2017 FINDINGS: CT HEAD FINDINGS Brain: There are 2 small foci of acute intraparenchymal hemorrhage in the left frontal lobe, measuring 9 mm, and 6 mm. There is no midline shift or hydrocephalus. No acute transcortical infarct is identified. Vascular: Atherosclerosis and views arterial megaly is identified. 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 Skull: No acute abnormality. Sinuses/Orbits: No acute finding. Other: None. CT CERVICAL SPINE FINDINGS Alignment: There is straightening of cervical spine either due to muscle spasm or positioning. Skull base and vertebrae: No  acute fracture. No primary bone lesion or focal pathologic process. Soft tissues and spinal canal: No prevertebral fluid or swelling. No visible canal hematoma. Disc levels: Degenerative joint changes of cervical spine with narrowed joint space and osteophyte formation are identified, more marked in the mid to lower cervical spine. Upper chest: Emphysematous changes of the lung apices are noted. Other: None. IMPRESSION: 1. There are 2 small foci of acute intraparenchymal hemorrhage in the left frontal lobe, measuring 9 mm, and 6 mm. 2. No acute fracture or dislocation of cervical spine. 3. 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 These results were called by telephone at the time of interpretation on 06/17/2019 at 9:09 pm to provider Fraser Din, who verbally acknowledged these results. Electronically Signed   By: Abelardo Diesel M.D.   On: 06/17/2019 21:12    Review of Systems  Constitutional: Negative for activity change, appetite change and chills.  HENT: Negative for congestion, sinus pressure, sneezing, sore throat, trouble swallowing and voice change.   Eyes: Negative for photophobia and visual disturbance.  Respiratory: Negative for cough, chest tightness, shortness of breath and wheezing.   Cardiovascular: Negative for chest pain, palpitations and leg swelling.  Gastrointestinal: Negative for abdominal distention, abdominal pain, constipation, diarrhea, nausea and vomiting.  Endocrine: Negative.   Genitourinary: Negative.   Musculoskeletal: Positive for arthralgias. Negative for back pain, myalgias and neck pain.  Skin: Negative.   Allergic/Immunologic: Negative.   Neurological: Positive for tremors and speech difficulty. Negative for dizziness, seizures, syncope, facial asymmetry, weakness, light-headedness, numbness and headaches.  Hematological: Negative.   Psychiatric/Behavioral: Negative.    Blood pressure (!) 138/92, pulse 89, temperature 98.8 F (37.1 C), temperature  source Axillary, resp. rate 19, height 6' (1.829 m), weight 79.5 kg, SpO2 95 %. Physical Exam  Constitutional: He appears well-developed and well-nourished.  HENT:  Head: Normocephalic.  Abrasion at left forehead  Eyes: Conjunctivae and EOM are normal.  Left pupil slightly larger than right. Both round and reactive  Cardiovascular: Normal rate and regular rhythm.  Respiratory: Effort normal. No respiratory distress.  GI: Soft. He exhibits no distension. There is no abdominal tenderness.  Musculoskeletal:        General: No tenderness or edema. Normal range of motion.     Cervical back: Normal range of motion and neck supple.  Neurological: He is alert. He has normal strength. No cranial nerve deficit or sensory deficit.  Oriented to self and place. Dysarthric speech, but at baseline per patient's wife  Skin: Skin is warm and dry.  Psychiatric: He has a normal mood and affect. His behavior is normal. Judgment and thought content normal.    Assessment/Plan: Patient is a 85 year old male, who suffered a mechanical fall yesterday  evening. He sustained two small foci of intraparenchymal hemorrhage in his left frontal lobe. He appears to be at his baseline based on his wife's report and his neuro exam. 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 Given the patient's age and the location of this aneurysm, would not recommend further work up or intervention. Patient is fine to discharge from a Neurosurgical standpoint once he is medically ready. There is no need for further imaging or follow up as an outpatient.  Viona Gilmore, DNP, AGNP-C Nurse Practitioner  [facility] [place] [address], [address], [place],  [address] P: [phone]    F: [phone]  06/18/2019, 8:09 AM

## 2019-06-18 NOTE — Evaluation (Signed)
Clinical/Bedside Swallow Evaluation Patient Details  Name: Joe Glover MRN: [id number] Date of Birth: 07-Jul-1925  Today's Date: 06/18/2019 Time: SLP Start Time (ACUTE ONLY): 30 SLP Stop Time (ACUTE ONLY): 1034 SLP Time Calculation (min) (ACUTE ONLY): 14 min  Past Medical History:  Past Medical History:  Diagnosis Date  . Awareness alteration, transient 11/02/2014  . Cellulitis of right hand 01/27/2017  . Colon cancer ([place])   . Essential hypertension 09/23/2012  . Fall 09/16/2016  . Fall from standing 02/25/2017  . GI bleed   . Hyperlipidemia   . Hypertension   . Insomnia 08/13/2016  . Normocytic anemia 01/27/2017  . On amiodarone therapy 08/06/2015  . Orthostatic hypotension 08/06/2015  . OSA (obstructive sleep apnea)   . PAF (paroxysmal atrial fibrillation) ([place])   . Parkinson disease ([place])   . Prostate cancer ([place])   . SDH (subdural hematoma) ([place]) 09/16/2016  . Status post placement of implantable loop recorder 07/11/2015  . Stroke ([place])   . 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 Subdural hematoma ([place]) 09/05/2016  . Syncope 11/02/2014  . TIA (transient ischemic attack)   . Vitamin D deficiency    Past Surgical History:  Past Surgical History:  Procedure Laterality Date  . COLECTOMY    . HERNIA REPAIR    . ORCHIECTOMY    . PROSTATECTOMY    . TOTAL HIP ARTHROPLASTY Right 11/11/2018   Procedure: TOTAL HIP ARTHROPLASTY ANTERIOR APPROACH;  Surgeon: Gaynelle Arabian, MD;  Location: [facility];  Service: Orthopedics;  Laterality: Right;  100   HPI:  Joe Glover is a 85 y.o. male with medical history significant of paroxysmal atrial fibrillation, sick sinus syndrome, orthostatic hypotensionParkinson disease with autonomic dysfunction, pseudogout, subdural hematoma, hypertension difficult to manage secondary to intermittent orthostatic hypotension, history of frequent falls.  Sleep apnea.  Prostate  cancer and colon cancer.  History of stroke Presented with syncopal episode at home resulting in the fall. He was trying to go to the bathroom and lost his balance.  He hit his head injuring his left side of his forehead.  CT revealed: "There are 2 small foci of acute intraparenchymal hemorrhage inthe left frontal lobe, measuring 9 mm, and 6 mm."  No chest imaging available at this time.   Assessment / Plan / Recommendation Clinical Impression  Pt presents with mild oropharyngeal dysphagia which is likely chronic in nature and related to Parkinson's disease.  Wife reports pt is at baseline level of function.  Consumes softer foods and thin liquids at home. She cuts up foods as needed.  Pt recieves botox injections, and is due for another the week after Christmas, so effects of botox may be wearing off at this time.  Pt tolerated thin liquid by cup and straw.  There was a single trial with cough following swallow.  Nectar thick liquid was trialed as well with good tolerance, before returning to thin liquid with no additional clinical s/s of aspiration.  There was frequent belching noted.  Pt's wife reports that he does take reflux medication. Although pt does not have chest imaging, he does not have a fever and there does not appear to be immediate concern for pneumonia.  Wife is well educated on swallowing dysfunction with Parkinson's disease, and does not have concerns about current level of function.  Recommend continuing current diet.  Pt has no acute ST needs.  SLP will sign off at this time.  SLP Visit Diagnosis: Dysphagia, oropharyngeal phase (R13.12)    Aspiration Risk  Mild aspiration risk    Diet Recommendation Regular;Thin liquid   Liquid Administration via: Straw;Cup Medication Administration: Whole meds with puree(crush larger pills if needed) Supervision: (Trained caregiver to assist with feeding) Compensations: Slow rate;Small sips/bites Postural Changes: Seated upright at 90 degrees     Other  Recommendations Oral Care Recommendations: Oral care BID   Follow up Recommendations None      Frequency and Duration   N/A         Prognosis   N/A     Swallow Study   General Date of Onset: 06/17/19 HPI: Joe Glover is a 85 y.o. male with medical history significant of paroxysmal atrial fibrillation, sick sinus syndrome, orthostatic hypotensionParkinson disease with autonomic dysfunction, pseudogout, subdural hematoma, hypertension difficult to manage secondary to intermittent orthostatic hypotension, history of frequent falls.  Sleep apnea.  Prostate cancer and colon cancer.  History of stroke Presented with syncopal episode at home resulting in the fall. He was trying to go to the bathroom and lost his balance.  He hit his head injuring his left side of his forehead.  CT revealed: "There are 2 small foci of acute intraparenchymal hemorrhage inthe left frontal lobe, measuring 9 mm, and 6 mm."  No chest imaging available at this time. Type of Study: Bedside Swallow Evaluation Previous Swallow Assessment: None in EMR; however, wife reports working with [facility] SLP and has had instrumental swallow studies in past 1-2 years ago.  Reports use of throat clear as strategy Diet Prior to this Study: Regular;Thin liquids Temperature Spikes Noted: No Respiratory Status: Room air Behavior/Cognition: Alert;Cooperative;Pleasant mood Oral Cavity Assessment: Dry Oral Care Completed by SLP: No Oral Cavity - Dentition: Adequate natural dentition Self-Feeding Abilities: Needs assist Patient Positioning: Upright in bed Baseline Vocal Quality: Normal Volitional Cough: Other (Comment)(Fair) Volitional Swallow: Able to elicit    Oral/Motor/Sensory Function Overall Oral Motor/Sensory Function: Within functional limits Facial ROM: Within Functional Limits Facial Symmetry: Within Functional Limits Lingual ROM: Within Functional Limits Lingual Symmetry: Within Functional Limits Lingual Strength:  Within Functional Limits Velum: Within Functional Limits Mandible: Within Functional Limits   Ice Chips Ice chips: Not tested   Thin Liquid Thin Liquid: Impaired Presentation: Cup;Straw Pharyngeal  Phase Impairments: Cough - Delayed(x1)    Nectar Thick Nectar Thick Liquid: Within functional limits Presentation: Cup   Honey Thick Honey Thick Liquid: Not tested   Puree Puree: Within functional limits Presentation: Spoon   Solid     Solid: Impaired Presentation: (SLP fed) Oral Phase Functional Implications: Oral residue      Celedonio Savage, MA, [place] Office: [phone] 06/18/2019,11:20 AM

## 2019-06-18 NOTE — Progress Notes (Signed)
OT Cancellation Note  Patient Details Name: Joe Glover MRN: [id number] DOB: [birth year]   Cancelled Treatment:    Reason Eval/Treat Not Completed: Patient at procedure or test/ unavailable. Patient is currently having an echo [place], OTR/L [place] Pager [phone] Office [phone]     Almon Register 06/18/2019, 1:05 PM

## 2019-06-18 NOTE — Progress Notes (Signed)
Echocardiogram 2D Echocardiogram has been performed.  Joe Glover 06/18/2019, 11:59 AM

## 2019-06-18 NOTE — TOC Transition Note (Signed)
Transition of Care (TOC) - CM/SW Discharge Note Marvetta Gibbons RN,BSN Transitions of Care Unit 4NP (non trauma) - RN Case Manager [phone]   Patient Details  Name: Maxi Vail MRN: [id number] Date of Birth: 17-Dec-1924  Transition of Care [facility]) CM/SW Contact:  Dawayne Patricia, RN Phone Number: 06/18/2019, 4:23 PM   Clinical Narrative:    Pt stable for transition home, orders have been placed for HHPT/OT- CM spoke with wife at bedside choice offered for [facility] needs with list provided Per CMS guidelines from medicare.gov website with star ratings (copy placed in shadow chart)- per wife they have used [facility] in past and would like to use them again. Wife states they have all needed DME at home, son to come assist in transport home. Address, PCP and phone # confirmed in epic. Call made to [facility] with [facility] for [facility] referral- referral has been accepted.    Final next level of care: [place] Barriers to Discharge: No Barriers Identified   Patient Goals and CMS Choice Patient states their goals for this hospitalization and ongoing recovery are:: return home CMS Medicare.gov Compare Post Acute Care list provided to:: Patient Represenative (must comment)(wife) Choice offered to / list presented to : Spouse  Discharge Placement               Home with [facility]        Discharge Plan and Services   Discharge Planning Services: CM Consult Post Acute Care Choice: Home Health          DME Arranged: N/A DME Agency: NA       HH Arranged: PT, OT [place] Agency: [facility] [employer]) Date [place]: 06/18/19 Time [place]: 1622 Representative spoke with at [place]: [place] ([place]) Interventions     Readmission Risk Interventions No flowsheet data found.

## 2019-06-18 NOTE — Evaluation (Signed)
Physical Therapy Evaluation Patient Details Name: Joe Glover MRN: [id number] DOB: 1924/09/13 Today's Date: 06/18/2019   History of Present Illness  85 y.o. male with medical history significant of paroxysmal a fib, sick sinus syndrome, orthostatic hypotension, Parkinson disease with autonomic dysfunction, pseudogout, subdural hematoma, hypertension (difficult to manage secondary to intermittent orthostatic hypotension), history of frequent falls (sustained hip fx and underwent THR May 2020), sleep apnea, prostate cancer and colon cancer.  He presented to [facility] with syncopal episode at home resulting in a fall.    Clinical Impression  Pt admitted with above diagnosis. PTA pt lived at home with his wife. He required supervision to min assist mobility, utilizing RW for amb. On eval, he required min guard assist bed mobility, min guard assist transfers and min guard assist ambulation 50' with RW.  Pt currently with functional limitations due to the deficits listed below (see PT Problem List). Pt will benefit from skilled PT to increase their independence and safety with mobility to allow discharge to the venue listed below.    Orthostatic BP Supine                    142/95 Sitting                     132/94 Initial stand             128/88 Stand after 2 min   125/93     Follow Up Recommendations Home health PT;Supervision/Assistance - 24 hour    Equipment Recommendations  None recommended by PT    Recommendations for Other Services       Precautions / Restrictions Precautions Precautions: Fall;Other (comment) Precaution Comments: orthostatic      Mobility  Bed Mobility Overal bed mobility: Needs Assistance Bed Mobility: Supine to Sit;Sit to Supine     Supine to sit: Min guard;HOB elevated Sit to supine: Min guard;HOB elevated   General bed mobility comments: +rail, min guard for safety, increased time  Transfers Overall transfer level: Needs assistance Equipment used:  Rolling walker (2 wheeled) Transfers: Sit to/from Stand Sit to Stand: Min guard         General transfer comment: min guard for safety  Ambulation/Gait Ambulation/Gait assistance: Min guard Gait Distance (Feet): 50 Feet Assistive device: Rolling walker (2 wheeled) Gait Pattern/deviations: Step-through pattern Gait velocity: decreased Gait velocity interpretation: <1.31 ft/sec, indicative of household ambulator General Gait Details: several instances of starting and stopping, nonfluid pattern (h/o Parkinsons)  Stairs Stairs: (Discussed stairs with wife. Declining full stair training this session. Advised to have son assist into house on d/c.)          Wheelchair Mobility    Modified Rankin (Stroke Patients Only)       Balance Overall balance assessment: Needs assistance Sitting-balance support: No upper extremity supported;Feet supported Sitting balance-Leahy Scale: Good     Standing balance support: Bilateral upper extremity supported;During functional activity Standing balance-Leahy Scale: Poor Standing balance comment: reliant on RW                             Pertinent Vitals/Pain Pain Assessment: No/denies pain Faces Pain Scale: No hurt    Home Living Family/patient expects to be discharged to:: Private residence Living Arrangements: Spouse/significant other Available Help at Discharge: Family;Available 24 hours/day Type of Home: House Home Access: Level entry     Home Layout: Multi-level Home Equipment: Walker - 2 wheels;Cane - single  point;Bedside commode;Grab bars - tub/shower;Shower seat;Hand held shower head;Grab bars - toilet;Transport chair Additional Comments: Has a RW and BSC on every level    Prior Function Level of Independence: Needs assistance   Gait / Transfers Assistance Needed: Pt amb with RW. Wife provides supervision to min assist.  ADL's / Homemaking Assistance Needed: wife assists with all ADLs.  Comments: Pt fell in  May 2020 sustaining hip fx and underwent THR. He discharged home with [facility] then transitioned to OP. Pt still active with OPPT at time of admission.     Hand Dominance        Extremity/Trunk Assessment   Upper Extremity Assessment Upper Extremity Assessment: Defer to OT evaluation    Lower Extremity Assessment Lower Extremity Assessment: Generalized weakness    Cervical / Trunk Assessment Cervical / Trunk Assessment: Kyphotic  Communication   Communication: Other (comment)(Pt verbalizing but largely unintelligable. Wife is able to understand him.)  Cognition Arousal/Alertness: Awake/alert Behavior During Therapy: WFL for tasks assessed/performed Overall Cognitive Status: Within Functional Limits for tasks assessed                                        General Comments      Exercises     Assessment/Plan    PT Assessment Patient needs continued PT services  PT Problem List Decreased mobility;Decreased strength;Decreased activity tolerance;Decreased balance       PT Treatment Interventions Therapeutic activities;Gait training;Therapeutic exercise;Patient/family education;Balance training;Stair training;Functional mobility training    PT Goals (Current goals can be found in the Care Plan section)  Acute Rehab PT Goals Patient Stated Goal: home per wife PT Goal Formulation: With patient/family Time For Goal Achievement: 07/02/19 Potential to Achieve Goals: Good    Frequency Min 3X/week   Barriers to discharge        Co-evaluation               AM-PAC PT "6 Clicks" Mobility  Outcome Measure Help needed turning from your back to your side while in a flat bed without using bedrails?: A Little Help needed moving from lying on your back to sitting on the side of a flat bed without using bedrails?: A Little Help needed moving to and from a bed to a chair (including a wheelchair)?: A Little Help needed standing up from a chair using your arms  (e.g., wheelchair or bedside chair)?: A Little Help needed to walk in hospital room?: A Little Help needed climbing 3-5 steps with a railing? : A Little 6 Click Score: 18    End of Session Equipment Utilized During Treatment: Gait belt Activity Tolerance: Patient tolerated treatment well Patient left: in bed;with call bell/phone within reach;with family/visitor present Nurse Communication: Mobility status PT Visit Diagnosis: Unsteadiness on feet (R26.81);Difficulty in walking, not elsewhere classified (R26.2)    Time: [id number] PT Time Calculation (min) (ACUTE ONLY): 31 min   Charges:   PT Evaluation $PT Eval Moderate Complexity: 1 Mod PT Treatments $Gait Training: 8-22 mins        Lorrin Goodell, PT  Office # [phone] Pager [phone]   Lorriane Shire 06/18/2019, 12:10 PM

## 2019-06-18 NOTE — Progress Notes (Signed)
D/C education given to Pt and wife and all questions answered. No printed prescriptions to give or equipment to deliver. IV removed and Pt taken to car with all belongings.

## 2019-06-18 NOTE — Evaluation (Signed)
Occupational Therapy Evaluation and Discharge   Patient Details Name: Joe Glover MRN: [id number] DOB: August 15, 1924 Today's Date: 06/18/2019    History of Present Illness 85 y.o. male with medical history significant of paroxysmal a fib, sick sinus syndrome, orthostatic hypotension, Parkinson disease with autonomic dysfunction, pseudogout, subdural hematoma, hypertension (difficult to manage secondary to intermittent orthostatic hypotension), history of frequent falls (sustained hip fx and underwent THR May 2020), sleep apnea, prostate cancer and colon cancer.  He presented to [facility] with syncopal episode at home resulting in a fall.   Clinical Impression   This 85 yo male admitted with above presents to acute OT with wife reporting she can tell he is a little weaker when he is up on his feet, but is pleased that he is picking his feet up today instead of shuffling them. She reports she will continue to help him with his basic ADLs as she has been and feels his biggest need is to remain as mobile as possible to help her take care of him easier. No further OT ,we will sign off.    Follow Up Recommendations  No OT follow up;Supervision/Assistance - 24 hour    Equipment Recommendations  None recommended by OT       Precautions / Restrictions Precautions Precautions: Fall Precaution Comments: h/o syncope Restrictions Weight Bearing Restrictions: No      Mobility Bed Mobility Overal bed mobility: Needs Assistance Bed Mobility: Supine to Sit;Sit to Supine     Supine to sit: Min guard;HOB elevated Sit to supine: Min guard;HOB elevated   General bed mobility comments: I placed the bed in trendelenberg and pt scoot himself up in bed, he did knock his head on the head board on his last scoot which had more power than his first two attempts (pushing with his legs bent)  Transfers Overall transfer level: Needs assistance Equipment used: Rolling walker (2 wheeled) Transfers: Sit to/from  Stand Sit to Stand: Min assist         General transfer comment: increased time and needed some steadying A as he came up and upon first standing    Balance Overall balance assessment: Needs assistance Sitting-balance support: No upper extremity supported;Feet supported Sitting balance-Leahy Scale: Good     Standing balance support: Bilateral upper extremity supported Standing balance-Leahy Scale: Poor Standing balance comment: reliant on RW and additonal external A                           ADL either performed or assessed with clinical judgement   ADL Overall ADL's : Needs assistance/impaired Eating/Feeding: Set up;Sitting   Grooming: Set up;Sitting   Upper Body Bathing: Set up;Sitting     Lower Body Bathing Details (indicate cue type and reason): wife normally assists due to patient's poor standing balance; min A sit<>stand Upper Body Dressing : Minimal assistance;Sitting     Lower Body Dressing Details (indicate cue type and reason): wife normally assists due to patient's poor standing balance; min A sit<>stand Toilet Transfer: Minimal assistance;Ambulation;RW Toilet Transfer Details (indicate cue type and reason): bed>50 feet>bed Toileting- Clothing Manipulation and Hygiene: Maximal assistance Toileting - Clothing Manipulation Details (indicate cue type and reason): min  sit<>stand             Vision Patient Visual Report: No change from baseline              Pertinent Vitals/Pain Pain Assessment: No/denies pain Faces Pain Scale: No hurt  Hand Dominance Right   Extremity/Trunk Assessment Upper Extremity Assessment Upper Extremity Assessment: Generalized weakness     Communication Communication Communication: Other (comment)(pt trying to talk but hard to understand, wife does understand him)   Cognition Arousal/Alertness: Awake/alert Behavior During Therapy: WFL for tasks assessed/performed Overall Cognitive Status: Within  Functional Limits for tasks assessed                                                Home Living Family/patient expects to be discharged to:: Private residence Living Arrangements: Spouse/significant other Available Help at Discharge: Family;Available 24 hours/day Type of Home: House Home Access: Level entry;Ramped entrance     Home Layout: Multi-level Alternate Level Stairs-Number of Steps: split level-8 up to bedroom Alternate Level Stairs-Rails: Right;Left Bathroom Shower/Tub: Teacher, early years/pre: Standard     Home Equipment: Environmental consultant - 2 wheels;Cane - single point;Bedside commode;Grab bars - tub/shower;Shower seat;Hand held shower head;Grab bars - toilet;Transport chair(gait belt (but wife reports she normally just uses the waist band of his pants to hold onto))   Additional Comments: Has a RW and BSC on every level      Prior Functioning/Environment Level of Independence: Needs assistance  Gait / Transfers Assistance Needed: Pt amb with RW. Wife provides supervision to min assist. ADL's / Homemaking Assistance Needed: wife assists with all ADLs.   Comments: Pt fell in May 2020 sustaining hip fx and underwent THR. He discharged home with [facility] then transitioned to OP. Pt still active with OPPT at time of admission.        OT Problem List: Decreased strength;Impaired balance (sitting and/or standing)         OT Goals(Current goals can be found in the care plan section) Acute Rehab OT Goals Patient Stated Goal: to go home today  OT Frequency:                AM-PAC OT "6 Clicks" Daily Activity     Outcome Measure Help from another person eating meals?: A Little Help from another person taking care of personal grooming?: A Little Help from another person toileting, which includes using toliet, bedpan, or urinal?: A Lot Help from another person bathing (including washing, rinsing, drying)?: A Lot Help from another person to put on and  taking off regular upper body clothing?: A Little Help from another person to put on and taking off regular lower body clothing?: Total 6 Click Score: 14   End of Session Equipment Utilized During Treatment: Gait belt;Rolling walker  Activity Tolerance: Patient tolerated treatment well Patient left: in bed;with call bell/phone within reach;with family/visitor present(wife reported bed alarm not needed she would be with him and they are hoping to go home today)  OT Visit Diagnosis: Unsteadiness on feet (R26.81);Other abnormalities of gait and mobility (R26.89);Repeated falls (R29.6);History of falling (Z91.81)                Time: [id number] OT Time Calculation (min): 37 min Charges:  OT General Charges $OT Visit: 1 Visit OT Evaluation $OT Eval Moderate Complexity: 1 Mod OT Treatments $Self Care/Home Management : 8-22 mins  Joe Glover, OTR/L Acute [employer] Pager [phone] Office [phone]     Joe Glover 06/18/2019, 2:28 PM

## 2019-06-18 NOTE — ED Notes (Signed)
Family at bedside. 

## 2019-06-18 NOTE — Progress Notes (Signed)
PT Cancellation Note  Patient Details Name: Arteen Moraitis MRN: [id number] DOB: [birth year]   Cancelled Treatment:    Reason Eval/Treat Not Completed: Patient at procedure or test/unavailable. Pt in CT. PT to re-attempt as time allows.   Lorriane Shire 06/18/2019, 8:43 AM   Lorrin Goodell, PT  Office # [phone] Pager [phone]

## 2019-06-18 NOTE — Discharge Summary (Signed)
Physician Discharge Summary  [place] [id number] DOB: January 08, 1925 DOA: 06/17/2019  PCP: [name], MD  Admit date: 06/17/2019 Discharge date: 06/18/2019  Time spent: 45 minutes  Recommendations for Outpatient Follow-up:  Patient will be discharged to home with home health, physical therapy.  Patient will need to follow up with primary care provider within one week of discharge.  Patient should continue medications as prescribed.  Patient should follow a heart healthy diet.   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 Toy Baker Joe Glover is a 85 y.o. male with medical history significant of paroxysmal atrial fibrillation, sick sinus syndrome, orthostatic hypotension Parkinson disease with autonomic dysfunction, pseudogout, subdural hematoma, hypertension difficult to manage secondary to intermittent orthostatic hypotension, history of frequent falls.  Sleep apnea.  Prostate cancer and colon cancer.  History of stroke  Presented with syncopal episode at home resulting in the fall. He was trying to go to the bathroom and lost his balance.  He hit his head injuring his left side of his forehead.  Denies being on any blood thinner brought in by [facility] Wife states that patient was at home try to get up by himself and fell down.  She was next room and heard him fall and so she went to see him.  He appeared to be out of bed for the few minutes with complete loss of consciousness.  When he finally woke up he was back to his normal self.  Because he have had subdural hematomas after a fall in the  past she wanted him to be checked out in the emergency department. Patient is unsure why he fell or how he fell.  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 -Repeat CT head on 06/18/2019: Stable small foci of acute hemorrhage in the left frontal periventricular white matter.  Stable thin subdural hematoma along the posterior right aspect of the interhemispheric falx and tentorium.  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 -Neurosurgery consulted and appreciated, patient is stable from a neurosurgical aspect.  And to follow-up with his primary care physician. -PT consulted recommending home health  Syncope -Patient has had history of orthostatic hypotension in the past and is on midodrine -Echocardiogram EF 60-65%. LV no RWMA. Trivial MR, TR. Mild AR  Parkinson disease with dementia -stable as per wife  Hyponatremia -Appears to be a chronic issue, patient was also found to be hyponatremic with sodium as low as 128 in May 2020 -sodium 130 (was 127 on admission) -Continue sodium chloride tablets -Currently asymptomatic  Essential hypertension -Currently stable, continue clonidine as needed  Fall -PT recommending home health  Orthostatic hypotension -Continue midodrine -Of note orthostatic vitals obtained, and currently unremarkable  Paroxysmal atrial fibrillation -Patient currently not on any rate  controlling medications -Not a candidate for anticoagulation given history of falls and GI bleed, subdural hematoma  Obstructive sleep apnea -Continue CPAP  Procedures: Echocardiogram  Consultations: Neurosurgery  Discharge Exam: Vitals:   06/18/19 0803 06/18/19 1537  BP: (!) 139/98 (!) 160/98  Pulse: 88 90  Resp: 13 12  Temp: 98.2 F (36.8 C) 98.1 F (36.7 C)  SpO2: 98% 99%     General: Well developed, well nourished, NAD, appears stated age  HEENT: [place], left forehead abrasion, mucous membranes moist.  Cardiovascular: S1 S2 auscultated, RRR  Respiratory: Clear to auscultation bilaterally with equal chest rise  Abdomen: Soft, nontender, nondistended, + bowel sounds  Extremities: warm dry without cyanosis clubbing or edema  Neuro: AAOx2 (self, place), dysarthric speech.  Per wife patient is at baseline.  Psych: Appropriate mood and affect, pleasant  Discharge Instructions Discharge Instructions    Discharge instructions   Complete by: As directed    Patient will be discharged to home with home health, physical therapy.  Patient will need to follow up with primary care provider within one week of discharge.  Patient should continue medications as prescribed.  Patient should follow a heart healthy diet.     Allergies as of 06/18/2019      Reactions   Aspirin Other (See Comments)   GI bleed   Augmentin [amoxicillin-pot Clavulanate] Hives   Hemocyte Plus [hematinic Plus Vit-minerals] Other (See Comments)   Unknown reaction   Penicillins Hives   Has patient had a PCN reaction causing immediate rash, facial/tongue/throat swelling, SOB or lightheadedness with hypotension: No Has patient had a PCN reaction causing severe rash involving mucus membranes or skin necrosis: Yes Has patient had a PCN reaction that required hospitalization: No Has patient had a PCN reaction occurring within the last 10 years: No If all of the above answers are "NO", then may proceed with  Cephalosporin use.      Medication List    TAKE these medications   BOTOX IJ Inject as directed every 3 (three) months. Next injection due in December 2019   cloNIDine 0.1 MG tablet Commonly known as: CATAPRES Take 1 tablet daily as needed for systolic (top number) blood pressure greater than 180 What changed:   how much to take  how to take this  when to take this   cyanocobalamin 1000 MCG/ML injection Commonly known as: (VITAMIN B-12) Inject 1,000 mcg into the muscle every 30 (thirty) days.   EPINEPHrine 0.3 mg/0.3 mL Soaj injection Commonly known as: EPI-PEN Inject 0.3 mg into the muscle daily as needed (allergic reaction).   ferrous sulfate 325 (65 FE) MG tablet Take 325 mg by mouth 3 (three) times daily with meals.   ipratropium 0.03 % nasal spray Commonly known as: ATROVENT Place 1 spray into the nose 2 (two) times daily.   loratadine 10 MG tablet Commonly known as: CLARITIN Take 10 mg by mouth at bedtime.   magnesium oxide 400 MG tablet Commonly known as: MAG-OX Take 800 mg by mouth 2 (two) times daily.   Melatonin 5 MG Tabs Take 5 mg by mouth at bedtime as needed (for sleep).   midodrine 2.5 MG tablet Commonly known as: PROAMATINE Take 1 tablet daily in the morning if two sequential systolic BP readings (top number) within 15 minutes are less than 100. What changed:  how much to take  how to take this  when to take this   omeprazole 20 MG capsule Commonly known as: PRILOSEC Take 20 mg by mouth daily.   potassium chloride SA 20 MEQ tablet Commonly known as: KLOR-CON Take 20 mEq by mouth daily.   pravastatin 40 MG tablet Commonly known as: PRAVACHOL Take 40 mg by mouth at bedtime.   PRESCRIPTION MEDICATION Inhale into the lungs at bedtime. CPAP   PROBIOTIC DAILY PO Take 1 tablet by mouth at bedtime.   psyllium 58.6 % packet Commonly known as: METAMUCIL Take 1 packet by mouth 2 (two) times daily.   sodium chloride 1 g tablet Take 1  tablet (1 g total) by mouth daily.   temazepam 7.5 MG capsule Commonly known as: Restoril Take 1 capsule (7.5 mg total) by mouth at bedtime as needed for sleep.   VITAMIN D3 PO Take 5,000 mg by mouth daily.      Allergies  Allergen Reactions  . Aspirin Other (See Comments)    GI bleed  . Augmentin [Amoxicillin-Pot Clavulanate] Hives  . Hemocyte Plus [Hematinic Plus Vit-Minerals] Other (See Comments)    Unknown reaction  . Penicillins Hives    Has patient had a PCN reaction causing immediate rash, facial/tongue/throat swelling, SOB or lightheadedness with hypotension: No Has patient had a PCN reaction causing severe rash involving mucus membranes or skin necrosis: Yes Has patient had a PCN reaction that required hospitalization: No Has patient had a PCN reaction occurring within the last 10 years: No If all of the above answers are "NO", then may proceed with Cephalosporin use.   Follow-up Information    [name], MD. Schedule an appointment as soon as possible for a visit in 1 week(s).   Specialty: Internal Medicine Why: Hospital follow up Contact information: [place] [address] [phone]        Richardo Priest, MD .   Specialty: Cardiology Contact information: [place] [address] [phone]            The results of significant diagnostics from this hospitalization (including imaging, microbiology, ancillary and laboratory) are listed below for reference.    Significant Diagnostic Studies: CT HEAD WO CONTRAST  Result Date: 06/18/2019 CLINICAL DATA:  Follow up intracranial hemorrhage. History intracranial aneurysms. EXAM: CT HEAD WITHOUT CONTRAST TECHNIQUE: Contiguous axial images were obtained from the base of the skull through the vertex without intravenous contrast. COMPARISON:  CT head 06/17/2019 and 07/26/2018. FINDINGS: Brain: Small foci of acute hemorrhage in the left frontal periventricular  white matter are stable, measuring up to 8 x 6 mm on image 18/3. Smaller focus on image 23/3 is stable. Patient also has a thin subdural hematoma along the posterior right aspect of the interhemispheric falx and tentorium, measuring 4 mm on image 21/3. This also appears unchanged. No new areas of intracranial hemorrhage are identified. Specifically, no evidence of subarachnoid hemorrhage. There is generalized atrophy with prominence of the ventricles and subarachnoid spaces. Extensive chronic small vessel ischemic changes are present in the periventricular white matter bilaterally. No evidence of acute cortical stroke. Vascular: Diffuse intracranial atherosclerosis again noted with 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 There is a fusiform aneurysm of the proximal left middle cerebral artery, measuring 9 x 9 mm, not significantly changed. Skull: No acute or  focal findings. Sinuses/Orbits: The visualized paranasal sinuses, mastoid air cells and middle ears are clear. No acute orbital findings. Previous lens surgery on the right. Other: None. IMPRESSION: 1. Stable small foci of acute hemorrhage in the left frontal periventricular white matter. 2. Stable thin subdural hematoma along the posterior right aspect of the interhemispheric falx and tentorium. No new areas intracranial hemorrhage. 3. 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 4. Stable atrophy and chronic small vessel ischemic changes. Electronically Signed   By: Richardean Sale M.D.   On: 06/18/2019 09:20   CT Head Wo Contrast  Result Date: 06/17/2019 CLINICAL DATA:  Syncopal episode. EXAM: CT HEAD WITHOUT CONTRAST CT CERVICAL SPINE WITHOUT CONTRAST TECHNIQUE: Multidetector CT imaging of the head and cervical spine was performed following the standard  protocol without intravenous contrast. Multiplanar CT image reconstructions of the cervical spine were also generated. COMPARISON:  Head CT October 02, 2005 MRI of the head January 20, 2017 FINDINGS: CT HEAD FINDINGS Brain: There are 2 small foci of acute intraparenchymal hemorrhage in the left frontal lobe, measuring 9 mm, and 6 mm. There is no midline shift or hydrocephalus. No acute transcortical infarct is identified. Vascular: Atherosclerosis and views arterial megaly is identified. 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 Skull: No acute abnormality. Sinuses/Orbits: No acute finding. Other: None. CT CERVICAL SPINE FINDINGS Alignment: There is straightening of cervical spine either due to muscle spasm or positioning. Skull base and vertebrae: No acute fracture. No primary bone lesion or focal pathologic process. Soft tissues and spinal canal: No prevertebral fluid or swelling. No visible canal hematoma. Disc levels: Degenerative joint changes of cervical spine with narrowed joint space and osteophyte formation are identified, more marked in the mid to lower cervical spine. Upper chest: Emphysematous changes of the lung apices are noted. Other: None. IMPRESSION: 1. There are 2 small foci of acute intraparenchymal hemorrhage in the left frontal lobe, measuring 9 mm, and 6 mm. 2. No acute fracture or dislocation of cervical spine. 3. 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 These results were called by telephone at the time of interpretation on 06/17/2019 at 9:09 pm to provider Fraser Din, who verbally acknowledged these results. Electronically Signed   By: Abelardo Diesel M.D.   On: 06/17/2019 21:12   CT Cervical Spine Wo Contrast  Result Date: 06/17/2019 CLINICAL DATA:  Syncopal episode. EXAM: CT HEAD WITHOUT CONTRAST CT CERVICAL SPINE WITHOUT CONTRAST TECHNIQUE: Multidetector CT imaging of the head and cervical  spine was performed following the standard protocol without intravenous contrast. Multiplanar CT image reconstructions of the cervical spine were also generated. COMPARISON:  Head CT October 02, 2005 MRI of the head January 20, 2017 FINDINGS: CT HEAD FINDINGS Brain: There are 2 small foci of acute intraparenchymal hemorrhage in the left frontal lobe, measuring 9 mm, and 6 mm. There is no midline shift or hydrocephalus. No acute transcortical infarct is identified. Vascular: Atherosclerosis and views arterial megaly is identified. 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 Skull: No acute abnormality. Sinuses/Orbits: No acute finding. Other: None. CT CERVICAL SPINE FINDINGS Alignment: There is straightening of cervical spine either due to muscle spasm or positioning. Skull base and vertebrae: No acute fracture. No primary bone lesion or focal pathologic process. Soft tissues and spinal canal: No prevertebral fluid or swelling. No visible  canal hematoma. Disc levels: Degenerative joint changes of cervical spine with narrowed joint space and osteophyte formation are identified, more marked in the mid to lower cervical spine. Upper chest: Emphysematous changes of the lung apices are noted. Other: None. IMPRESSION: 1. There are 2 small foci of acute intraparenchymal hemorrhage in the left frontal lobe, measuring 9 mm, and 6 mm. 2. No acute fracture or dislocation of cervical spine. 3. 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 These results were called by telephone at the time of interpretation on 06/17/2019 at 9:09 pm to provider Fraser Din, who verbally acknowledged these results. Electronically Signed   By: Abelardo Diesel M.D.   On: 06/17/2019 21:12   ECHOCARDIOGRAM COMPLETE  Result Date: 06/18/2019   ECHOCARDIOGRAM REPORT   Patient Name:   Joe Glover Date of Exam: 06/18/2019 Medical Rec #:  [id number]       Height:        72.0 in Accession #:    [id number]      Weight:       175.3 lb Date of Birth:  04-23-1925       BSA:          2.01 m Patient Age:    85 years        BP:           125/93 mmHg Patient Gender: M               HR:           74 bpm. Exam Location:  Inpatient Procedure: 2D Echo, Color Doppler and Cardiac Doppler Indications:    R55 Syncope  History:        Patient has prior history of Echocardiogram examinations, most                 recent 01/27/2017. Arrythmias:Atrial Fibrillation; Risk                 Factors:Sleep Apnea, Hypertension and Dyslipidemia. Parkinson's                 Disease.  Sonographer:    Raquel Sarna Senior RDCS Referring Phys: [place]  Sonographer Comments: Technically difficult study due to poor echo windows. IMPRESSIONS  1. Left ventricular ejection fraction, by visual estimation, is 60 to 65%. The left ventricle has normal function. There is no left ventricular hypertrophy.  2. The left ventricle has no regional wall motion abnormalities.  3. Global right ventricle has normal systolic function.The right ventricular size is normal. No increase in right ventricular wall thickness.  4. Left atrial size was normal.  5. Right atrial size was mildly dilated.  6. The mitral valve is normal in structure. Trivial mitral valve regurgitation.  7. The tricuspid valve is normal in structure. Tricuspid valve regurgitation is trivial.  8. The aortic valve is grossly normal. Aortic valve regurgitation is mild.  9. The pulmonic valve was grossly normal. Pulmonic valve regurgitation is not visualized. 10. The atrial septum is grossly normal. FINDINGS  Left Ventricle: Left ventricular ejection fraction, by visual estimation, is 60 to 65%. The left ventricle has normal function. The left ventricle has no regional wall motion abnormalities. There is no left ventricular hypertrophy. Right Ventricle: The right ventricular size is normal. No increase in right ventricular wall thickness. Global RV systolic  function is has normal systolic function. Left Atrium: Left atrial size was normal in size. Right Atrium: Right atrial size was mildly dilated Pericardium: There is no  evidence of pericardial effusion. Mitral Valve: The mitral valve is normal in structure. Trivial mitral valve regurgitation. Tricuspid Valve: The tricuspid valve is normal in structure. Tricuspid valve regurgitation is trivial. Aortic Valve: The aortic valve is grossly normal. Aortic valve regurgitation is mild. Pulmonic Valve: The pulmonic valve was grossly normal. Pulmonic valve regurgitation is not visualized. Pulmonic regurgitation is not visualized. Aorta: The aortic root and ascending aorta are structurally normal, with no evidence of dilitation. IAS/Shunts: The atrial septum is grossly normal.  LEFT VENTRICLE PLAX 2D LVIDd:         4.69 cm LVIDs:         3.69 cm LV PW:         0.98 cm LV IVS:        1.09 cm LVOT diam:     2.30 cm LV SV:         44 ml LV SV Index:   21.91 LVOT Area:     4.15 cm  RIGHT VENTRICLE RV S prime:     11.10 cm/s TAPSE (M-mode): 2.5 cm LEFT ATRIUM           Index       RIGHT ATRIUM           Index LA diam:      2.20 cm 1.09 cm/m  RA Area:     22.50 cm LA Vol (A2C): 21.7 ml 10.77 ml/m RA Volume:   65.80 ml  32.67 ml/m LA Vol (A4C): 32.3 ml 16.04 ml/m  AORTIC VALVE LVOT Vmax:   68.70 cm/s LVOT Vmean:  47.800 cm/s LVOT VTI:    0.112 m  AORTA Ao Root diam: 3.60 cm  SHUNTS Systemic VTI:  0.11 m Systemic Diam: 2.30 cm  Mertie Moores MD Electronically signed by Mertie Moores MD Signature Date/Time: 06/18/2019/3:30:05 PM    Final     Microbiology: Recent Results (from the past 240 hour(s))  SARS CORONAVIRUS 2 (TAT 6-24 HRS) Nasopharyngeal Nasopharyngeal Swab     Status: None   Collection Time: 06/17/19 11:55 PM   Specimen: Nasopharyngeal Swab  Result Value Ref Range Status   SARS Coronavirus 2 NEGATIVE NEGATIVE Final    Comment: (NOTE) SARS-CoV-2 target nucleic acids are NOT DETECTED. The SARS-CoV-2 RNA is  generally detectable in upper and lower respiratory specimens during the acute phase of infection. Negative results do not preclude SARS-CoV-2 infection, do not rule out co-infections with other pathogens, and should not be used as the sole basis for treatment or other patient management decisions. Negative results must be combined with clinical observations, patient history, and epidemiological information. The expected result is Negative. Fact Sheet for Patients: SugarRoll.be Fact Sheet for Healthcare Providers: https://www.woods-mathews.com/ This test is not yet approved or cleared by the [place] FDA and  has been authorized for detection and/or diagnosis of SARS-CoV-2 by FDA under an Emergency Use Authorization (EUA). This EUA will remain  in effect (meaning this test can be used) for the duration of the COVID-19 declaration under Section 56 4(b)(1) of the Act, 21 U.S.C. section 360bbb-3(b)(1), unless the authorization is terminated or revoked sooner. Performed at [facility], [place] [address] [place], [place] [address]      Labs: Basic Metabolic Panel: Recent Labs  Lab 06/17/19 2035 06/18/19 0212  NA 127* 130*  K 4.4 4.4  CL 93* 96*  CO2 25 26  GLUCOSE 104* 101*  BUN 12 12  CREATININE 0.99 0.86  CALCIUM 8.6* 8.8*  MG  --  1.9  PHOS  --  3.0   Liver Function Tests: Recent Labs  Lab 06/18/19 0212  AST 18  ALT 15  ALKPHOS 63  BILITOT 1.2  PROT 6.7  ALBUMIN 3.7   No results for input(s): LIPASE, AMYLASE in the last 168 hours. No results for input(s): AMMONIA in the last 168 hours. CBC: Recent Labs  Lab 06/17/19 2035 06/18/19 0212  WBC 5.4 5.5  NEUTROABS 3.5  --   HGB 13.5 14.0  HCT 40.4 41.8  MCV 83.0 83.1  PLT 200 192   Cardiac Enzymes: No results for input(s): CKTOTAL, CKMB, CKMBINDEX, TROPONINI in the last 168 hours. BNP: BNP (last 3 results) No results for input(s): BNP in the last 8760  hours.  ProBNP (last 3 results) No results for input(s): PROBNP in the last 8760 hours.  CBG: No results for input(s): GLUCAP in the last 168 hours.     Signed:  Cristal Ford  [facility] 06/18/2019, 3:44 PM

## 2019-06-25 ENCOUNTER — Encounter (HOSPITAL_COMMUNITY): Payer: Self-pay

## 2019-06-25 ENCOUNTER — Other Ambulatory Visit: Payer: Self-pay

## 2019-06-25 ENCOUNTER — Inpatient Hospital Stay (HOSPITAL_COMMUNITY)
Admission: EM | Admit: 2019-06-25 | Discharge: 2019-07-05 | DRG: 312 | Disposition: A | Discharge status: Skilled Nursing Facility | Payer: Medicare Other | Attending: Internal Medicine | Admitting: Internal Medicine

## 2019-06-25 ENCOUNTER — Emergency Department (HOSPITAL_COMMUNITY): Payer: Medicare Other

## 2019-06-25 ENCOUNTER — Emergency Department (HOSPITAL_COMMUNITY)
Admission: EM | Admit: 2019-06-25 | Discharge: 2019-06-25 | Disposition: A | Discharge status: Home / Self Care | Payer: Medicare Other | Source: Home / Self Care | Attending: Emergency Medicine | Admitting: Emergency Medicine

## 2019-06-25 DIAGNOSIS — I951 Orthostatic hypotension: Secondary | ICD-10-CM | POA: Diagnosis not present

## 2019-06-25 DIAGNOSIS — I480 Paroxysmal atrial fibrillation: Secondary | ICD-10-CM | POA: Diagnosis present

## 2019-06-25 DIAGNOSIS — E785 Hyperlipidemia, unspecified: Secondary | ICD-10-CM | POA: Diagnosis present

## 2019-06-25 DIAGNOSIS — I10 Essential (primary) hypertension: Secondary | ICD-10-CM | POA: Insufficient documentation

## 2019-06-25 DIAGNOSIS — G909 Disorder of the autonomic nervous system, unspecified: Secondary | ICD-10-CM | POA: Diagnosis present

## 2019-06-25 DIAGNOSIS — Z8249 Family history of ischemic heart disease and other diseases of the circulatory system: Secondary | ICD-10-CM

## 2019-06-25 DIAGNOSIS — Z8546 Personal history of malignant neoplasm of prostate: Secondary | ICD-10-CM | POA: Insufficient documentation

## 2019-06-25 DIAGNOSIS — S065X9A Traumatic subdural hemorrhage with loss of consciousness of unspecified duration, initial encounter: Secondary | ICD-10-CM | POA: Diagnosis present

## 2019-06-25 DIAGNOSIS — Z20828 Contact with and (suspected) exposure to other viral communicable diseases: Secondary | ICD-10-CM | POA: Diagnosis present

## 2019-06-25 DIAGNOSIS — N390 Urinary tract infection, site not specified: Secondary | ICD-10-CM | POA: Diagnosis present

## 2019-06-25 DIAGNOSIS — Z880 Allergy status to penicillin: Secondary | ICD-10-CM

## 2019-06-25 DIAGNOSIS — E876 Hypokalemia: Secondary | ICD-10-CM | POA: Diagnosis not present

## 2019-06-25 DIAGNOSIS — Z66 Do not resuscitate: Secondary | ICD-10-CM | POA: Diagnosis present

## 2019-06-25 DIAGNOSIS — Z85038 Personal history of other malignant neoplasm of large intestine: Secondary | ICD-10-CM

## 2019-06-25 DIAGNOSIS — G4700 Insomnia, unspecified: Secondary | ICD-10-CM | POA: Diagnosis present

## 2019-06-25 DIAGNOSIS — G4733 Obstructive sleep apnea (adult) (pediatric): Secondary | ICD-10-CM | POA: Diagnosis present

## 2019-06-25 DIAGNOSIS — D649 Anemia, unspecified: Secondary | ICD-10-CM | POA: Diagnosis present

## 2019-06-25 DIAGNOSIS — Z79899 Other long term (current) drug therapy: Secondary | ICD-10-CM

## 2019-06-25 DIAGNOSIS — Z96641 Presence of right artificial hip joint: Secondary | ICD-10-CM | POA: Insufficient documentation

## 2019-06-25 DIAGNOSIS — R55 Syncope and collapse: Secondary | ICD-10-CM

## 2019-06-25 DIAGNOSIS — S065X9D Traumatic subdural hemorrhage with loss of consciousness of unspecified duration, subsequent encounter: Secondary | ICD-10-CM

## 2019-06-25 DIAGNOSIS — Z95818 Presence of other cardiac implants and grafts: Secondary | ICD-10-CM | POA: Insufficient documentation

## 2019-06-25 DIAGNOSIS — R5383 Other fatigue: Secondary | ICD-10-CM | POA: Insufficient documentation

## 2019-06-25 DIAGNOSIS — B9620 Unspecified Escherichia coli [E. coli] as the cause of diseases classified elsewhere: Secondary | ICD-10-CM | POA: Diagnosis present

## 2019-06-25 DIAGNOSIS — W19XXXD Unspecified fall, subsequent encounter: Secondary | ICD-10-CM | POA: Diagnosis present

## 2019-06-25 DIAGNOSIS — G20 Parkinson's disease: Secondary | ICD-10-CM | POA: Diagnosis present

## 2019-06-25 DIAGNOSIS — E871 Hypo-osmolality and hyponatremia: Secondary | ICD-10-CM | POA: Diagnosis present

## 2019-06-25 DIAGNOSIS — F0280 Dementia in other diseases classified elsewhere without behavioral disturbance: Secondary | ICD-10-CM | POA: Diagnosis present

## 2019-06-25 DIAGNOSIS — E559 Vitamin D deficiency, unspecified: Secondary | ICD-10-CM | POA: Diagnosis present

## 2019-06-25 DIAGNOSIS — Z87891 Personal history of nicotine dependence: Secondary | ICD-10-CM | POA: Insufficient documentation

## 2019-06-25 DIAGNOSIS — I959 Hypotension, unspecified: Secondary | ICD-10-CM | POA: Insufficient documentation

## 2019-06-25 DIAGNOSIS — I69322 Dysarthria following cerebral infarction: Secondary | ICD-10-CM

## 2019-06-25 DIAGNOSIS — Z886 Allergy status to analgesic agent status: Secondary | ICD-10-CM

## 2019-06-25 DIAGNOSIS — Z9079 Acquired absence of other genital organ(s): Secondary | ICD-10-CM

## 2019-06-25 DIAGNOSIS — Z888 Allergy status to other drugs, medicaments and biological substances status: Secondary | ICD-10-CM

## 2019-06-25 DIAGNOSIS — R296 Repeated falls: Secondary | ICD-10-CM | POA: Diagnosis present

## 2019-06-25 DIAGNOSIS — I495 Sick sinus syndrome: Secondary | ICD-10-CM | POA: Diagnosis present

## 2019-06-25 DIAGNOSIS — R509 Fever, unspecified: Secondary | ICD-10-CM

## 2019-06-25 DIAGNOSIS — S065XAA Traumatic subdural hemorrhage with loss of consciousness status unknown, initial encounter: Secondary | ICD-10-CM | POA: Diagnosis present

## 2019-06-25 LAB — CBC WITH DIFFERENTIAL/PLATELET
Abs Immature Granulocytes: 0.01 10*3/uL (ref 0.00–0.07)
Abs Immature Granulocytes: 0.02 10*3/uL (ref 0.00–0.07)
Basophils Absolute: 0 10*3/uL (ref 0.0–0.1)
Basophils Absolute: 0 10*3/uL (ref 0.0–0.1)
Basophils Relative: 1 %
Basophils Relative: 1 %
Eosinophils Absolute: 0.2 10*3/uL (ref 0.0–0.5)
Eosinophils Absolute: 0.2 10*3/uL (ref 0.0–0.5)
Eosinophils Relative: 4 %
Eosinophils Relative: 3 %
HCT: 39.7 % (ref 39.0–52.0)
HCT: 41.8 % (ref 39.0–52.0)
Hemoglobin: 13.3 g/dL (ref 13.0–17.0)
Hemoglobin: 13.7 g/dL (ref 13.0–17.0)
Immature Granulocytes: 0 %
Immature Granulocytes: 0 %
Lymphocytes Relative: 24 %
Lymphocytes Relative: 16 %
Lymphs Abs: 1.1 10*3/uL (ref 0.7–4.0)
Lymphs Abs: 0.9 10*3/uL (ref 0.7–4.0)
MCH: 28.1 pg (ref 26.0–34.0)
MCH: 28 pg (ref 26.0–34.0)
MCHC: 33.5 g/dL (ref 30.0–36.0)
MCHC: 32.8 g/dL (ref 30.0–36.0)
MCV: 83.9 fL (ref 80.0–100.0)
MCV: 85.3 fL (ref 80.0–100.0)
Monocytes Absolute: 0.7 10*3/uL (ref 0.1–1.0)
Monocytes Absolute: 0.6 10*3/uL (ref 0.1–1.0)
Monocytes Relative: 15 %
Monocytes Relative: 10 %
Neutro Abs: 2.4 10*3/uL (ref 1.7–7.7)
Neutro Abs: 4.1 10*3/uL (ref 1.7–7.7)
Neutrophils Relative %: 56 %
Neutrophils Relative %: 70 %
Platelets: 191 10*3/uL (ref 150–400)
Platelets: 221 10*3/uL (ref 150–400)
RBC: 4.73 MIL/uL (ref 4.22–5.81)
RBC: 4.9 MIL/uL (ref 4.22–5.81)
RDW: 14.4 % (ref 11.5–15.5)
RDW: 14.6 % (ref 11.5–15.5)
WBC: 4.3 10*3/uL (ref 4.0–10.5)
WBC: 5.8 10*3/uL (ref 4.0–10.5)
nRBC: 0 % (ref 0.0–0.2)
nRBC: 0 % (ref 0.0–0.2)

## 2019-06-25 LAB — BASIC METABOLIC PANEL
Anion gap: 10 (ref 5–15)
BUN: 10 mg/dL (ref 8–23)
CO2: 22 mmol/L (ref 22–32)
Calcium: 8.7 mg/dL — ABNORMAL LOW (ref 8.9–10.3)
Chloride: 94 mmol/L — ABNORMAL LOW (ref 98–111)
Creatinine, Ser: 0.89 mg/dL (ref 0.61–1.24)
GFR calc Af Amer: >60 mL/min (ref >60)
GFR calc non Af Amer: >60 mL/min (ref >60)
Glucose, Bld: 98 mg/dL (ref 70–99)
Potassium: 4.6 mmol/L (ref 3.5–5.1)
Sodium: 126 mmol/L — ABNORMAL LOW (ref 135–145)

## 2019-06-25 LAB — COMPREHENSIVE METABOLIC PANEL
ALT: 13 U/L (ref 0–44)
AST: 17 U/L (ref 15–41)
Albumin: 3.3 g/dL — ABNORMAL LOW (ref 3.5–5.0)
Alkaline Phosphatase: 61 U/L (ref 38–126)
Anion gap: 9 (ref 5–15)
BUN: 10 mg/dL (ref 8–23)
CO2: 25 mmol/L (ref 22–32)
Calcium: 8.7 mg/dL — ABNORMAL LOW (ref 8.9–10.3)
Chloride: 94 mmol/L — ABNORMAL LOW (ref 98–111)
Creatinine, Ser: 0.87 mg/dL (ref 0.61–1.24)
GFR calc Af Amer: >60 mL/min (ref >60)
GFR calc non Af Amer: >60 mL/min (ref >60)
Glucose, Bld: 103 mg/dL — ABNORMAL HIGH (ref 70–99)
Potassium: 4.8 mmol/L (ref 3.5–5.1)
Sodium: 128 mmol/L — ABNORMAL LOW (ref 135–145)
Total Bilirubin: 0.8 mg/dL (ref 0.3–1.2)
Total Protein: 6.8 g/dL (ref 6.5–8.1)

## 2019-06-25 LAB — TROPONIN I (HIGH SENSITIVITY)
Troponin I (High Sensitivity): 9 ng/L (ref <18)
Troponin I (High Sensitivity): 10 ng/L

## 2019-06-25 LAB — CBG MONITORING, ED: Glucose-Capillary: 73 mg/dL (ref 70–99)

## 2019-06-25 MED ORDER — ACETAMINOPHEN 325 MG PO TABS
650.0000 mg | ORAL_TABLET | Freq: Once | ORAL | Status: AC
  Administered 2019-06-25: 650 mg via ORAL
  Filled 2019-06-25: qty 2

## 2019-06-25 MED ORDER — ACETAMINOPHEN 325 MG PO TABS
650.0000 mg | ORAL_TABLET | Freq: Four times a day (QID) | ORAL | Status: DC | PRN
  Administered 2019-06-27 – 2019-07-04 (×3): 650 mg via ORAL
  Filled 2019-06-25 (×3): qty 2

## 2019-06-25 MED ORDER — SODIUM CHLORIDE 1 G PO TABS
1.0000 g | ORAL_TABLET | Freq: Every day | ORAL | Status: DC
  Administered 2019-06-26 – 2019-07-05 (×10): 1 g via ORAL
  Filled 2019-06-25 (×10): qty 1

## 2019-06-25 MED ORDER — MIDODRINE HCL 5 MG PO TABS: 2.5000 mg | ORAL_TABLET | Freq: Every day | ORAL | Status: DC | PRN

## 2019-06-25 MED ORDER — SODIUM CHLORIDE 0.9% FLUSH
3.0000 mL | Freq: Two times a day (BID) | INTRAVENOUS | Status: DC
  Administered 2019-06-26 – 2019-07-05 (×16): 3 mL via INTRAVENOUS

## 2019-06-25 MED ORDER — ACETAMINOPHEN 650 MG RE SUPP: 650.0000 mg | Freq: Four times a day (QID) | RECTAL | Status: DC | PRN

## 2019-06-25 MED ORDER — SODIUM CHLORIDE 0.9 % IV BOLUS
500.0000 mL | Freq: Once | INTRAVENOUS | Status: AC
  Administered 2019-06-25: 500 mL via INTRAVENOUS

## 2019-06-25 NOTE — ED Provider Notes (Signed)
[facility] EMERGENCY DEPARTMENT Provider Note   CSN: [id number] Arrival date & time: 06/25/19  1212     History Chief Complaint  Patient presents with  . Near Syncope  . Fall    [address] [place] is a 85 y.o. male presenting for evaluation of syncopal event.  History provided mostly by patient's wife due to patient having parkinsons.  Patient's wife states he was sitting in the recliner and she noticed that he was unresponsive for approximately 10 minutes.  He then gradually came to, however was relatively groggy.  At that time, patient was found to be hypotensive with a blood pressure in 60s.  Patient's wife states he has had frequent episodes of hypotension and loss of consciousness, this has been worked up extensively with neurology and in the hospital without cause.  Patient's wife states he fell last week, was found to have 2 frontal subdural hemorrhages.  Since then, he has been tired, however not confused.  She denies fevers, chills, cough, nausea, vomiting, change in appetite, change in urine output, or abnormal bowel movements.  Additional history obtained from chart review.  Patient with a history of hypertension, frequent falls, OSA, PAF, Parkinson's, prostate cancer, previous stroke, syncope and collapse.  He sees Dr. Dione Booze with neurology.   HPI     Past Medical History:  Diagnosis Date  . Awareness alteration, transient 11/02/2014  . Cellulitis of right hand 01/27/2017  . Colon cancer ([place])   . Essential hypertension 09/23/2012  . Fall 09/16/2016  . Fall from standing 02/25/2017  . GI bleed   . Hyperlipidemia   . Hypertension   . Insomnia 08/13/2016  . Normocytic anemia 01/27/2017  . On amiodarone therapy 08/06/2015  . Orthostatic hypotension 08/06/2015  . OSA (obstructive sleep apnea)   . PAF (paroxysmal atrial fibrillation) ([place])   . Parkinson disease ([place])   . Prostate cancer ([place])   . SDH (subdural hematoma) ([place]) 09/16/2016  . Status post  placement of implantable loop recorder 07/11/2015  . Stroke ([place])   . 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 Subdural hematoma ([place]) 09/05/2016  . Syncope 11/02/2014  . TIA (transient ischemic attack)   . Vitamin D deficiency     Patient Active Problem List   Diagnosis Date Noted  . Intraparenchymal hematoma of brain due to trauma ([place]) 06/17/2019  . Closed right hip fracture ([place]) 11/09/2018  . Sick sinus syndrome ([place]) 09/16/2018  . Bradycardia 05/14/2018  . Fall from standing 02/25/2017  . Parkinson disease ([place]) 01/27/2017  . Hyponatremia 01/27/2017  . Hypokalemia 01/27/2017  . Cellulitis of right hand 01/27/2017  . Normocytic anemia 01/27/2017  . Fall 09/16/2016  . SDH (subdural hematoma) ([place]) 09/16/2016  . Subdural hematoma ([place]) 09/05/2016  . Insomnia 08/13/2016  . On amiodarone therapy 08/06/2015  . Orthostatic hypotension 08/06/2015  . PAF (paroxysmal atrial fibrillation) ([place]) 08/06/2015  . Status post placement of implantable loop recorder 07/11/2015  . 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 Syncope 11/02/2014  . Awareness alteration, transient 11/02/2014  . OSA (obstructive sleep apnea) 05/31/2014  . Essential hypertension 09/23/2012  . Prostate cancer ([place]) 09/23/2012    Past Surgical History:  Procedure Laterality Date  . COLECTOMY    . HERNIA REPAIR    . ORCHIECTOMY    . PROSTATECTOMY    . TOTAL HIP ARTHROPLASTY Right 11/11/2018   Procedure:  TOTAL HIP ARTHROPLASTY ANTERIOR APPROACH;  Surgeon: Gaynelle Arabian, MD;  Location: [facility]S;  Service: Orthopedics;  Laterality: Right;  100       Family History  Problem Relation Age of Onset  . Parkinson's disease Mother   . Heart attack Father   . Heart attack Brother     Social History   Tobacco Use  . Smoking status: Former Smoker    Packs/day: 2.00    Years: 20.00     Pack years: 40.00    Types: Cigarettes    Quit date: 07/08/1958    Years since quitting: 61.0  . Smokeless tobacco: Never Used  Substance Use Topics  . Alcohol use: Yes    Alcohol/week: 0.0 standard drinks    Comment: occasional - 1-2 drinks a month  . Drug use: No    Home Medications Prior to Admission medications   Medication Sig Start Date End Date Taking? Authorizing Provider  Cholecalciferol (VITAMIN D3 PO) Take 5,000 mg by mouth daily.    [provider]  cloNIDine (CATAPRES) 0.1 MG tablet Take 1 tablet daily as needed for systolic (top number) blood pressure greater than 180 Patient taking differently: Take 0.1 mg by mouth See admin instructions. Take 1 tablet daily as needed for systolic (top number) blood pressure greater than 180 02/19/19   Munley, Hilton Cork, MD  cyanocobalamin (,VITAMIN B-12,) 1000 MCG/ML injection Inject 1,000 mcg into the muscle every 30 (thirty) days.     [provider]  EPINEPHrine 0.3 mg/0.3 mL IJ SOAJ injection Inject 0.3 mg into the muscle daily as needed (allergic reaction).    [provider]  ferrous sulfate 325 (65 FE) MG tablet Take 325 mg by mouth 3 (three) times daily with meals.     [provider]  ipratropium (ATROVENT) 0.03 % nasal spray Place 1 spray into the nose 2 (two) times daily. 08/20/17   [provider]  loratadine (CLARITIN) 10 MG tablet Take 10 mg by mouth at bedtime.     [provider]  magnesium oxide (MAG-OX) 400 MG tablet Take 800 mg by mouth 2 (two) times daily.     [provider]  Melatonin 5 MG TABS Take 5 mg by mouth at bedtime as needed (for sleep).     [provider]  midodrine (PROAMATINE) 2.5 MG tablet Take 1 tablet daily in the morning if two sequential systolic BP readings (top number) within 15 minutes are less than 100. Patient taking differently: Take 2.5 mg by mouth See admin instructions. Take 1 tablet daily in the morning if two sequential  systolic BP readings (top number) within 15 minutes are less than 100. 03/09/19   Munley, Hilton Cork, MD  omeprazole (PRILOSEC) 20 MG capsule Take 20 mg by mouth daily.  04/26/14   [provider]  OnabotulinumtoxinA (BOTOX IJ) Inject as directed every 3 (three) months. Next injection due in December 2019    [provider]  potassium chloride SA (K-DUR,KLOR-CON) 20 MEQ tablet Take 20 mEq by mouth daily.  08/04/18   [provider]  pravastatin (PRAVACHOL) 40 MG tablet Take 40 mg by mouth at bedtime.  05/07/14   [provider]  PRESCRIPTION MEDICATION Inhale into the lungs at bedtime. CPAP    [provider]  Probiotic Product (PROBIOTIC DAILY PO) Take 1 tablet by mouth at bedtime.     [provider]  psyllium (METAMUCIL) 58.6 % packet Take 1 packet by mouth 2 (two) times daily.  [provider]  sodium chloride 1 g tablet Take 1 tablet (1 g total) by mouth daily. 06/18/18   Richardo Priest, MD  temazepam (RESTORIL) 7.5 MG capsule Take 1 capsule (7.5 mg total) by mouth at bedtime as needed for sleep. 07/26/18   Quintella Reichert, MD    Allergies    Aspirin, Augmentin [amoxicillin-pot clavulanate], Hemocyte plus [hematinic plus vit-minerals], and Penicillins  Review of Systems   Review of Systems  Neurological: Positive for syncope.  All other systems reviewed and are negative.   Physical Exam Updated Vital Signs BP (!) 151/94   Pulse 82   Temp 97.9 F (36.6 C) (Oral)   Resp 15   Ht 6' (1.829 m)   Wt 79.5 kg   SpO2 100%   BMI 23.77 kg/m   Physical Exam Vitals and nursing note reviewed.  Constitutional:      General: He is not in acute distress.    Appearance: He is well-developed.     Comments: Elderly male resting comfortably in the bed in no acute distress  HENT:     Head: Normocephalic and atraumatic.  Eyes:     Extraocular Movements: Extraocular movements intact.     Conjunctiva/sclera: Conjunctivae normal.      Pupils: Pupils are equal, round, and reactive to light.  Cardiovascular:     Rate and Rhythm: Normal rate and regular rhythm.     Pulses: Normal pulses.  Pulmonary:     Effort: Pulmonary effort is normal. No respiratory distress.     Breath sounds: Normal breath sounds. No wheezing.     Comments: Clear lung sounds in all fields Abdominal:     General: There is no distension.     Palpations: Abdomen is soft. There is no mass.     Tenderness: There is no abdominal tenderness. There is no guarding or rebound.  Musculoskeletal:        General: Normal range of motion.     Cervical back: Normal range of motion and neck supple.     Right lower leg: No edema.     Left lower leg: No edema.  Skin:    General: Skin is warm and dry.  Neurological:     Mental Status: He is alert and oriented to person, place, and time.     GCS: GCS eye subscore is 4. GCS verbal subscore is 5. GCS motor subscore is 6.     Sensory: Sensation is intact.     Motor: Motor function is intact.     Comments: Alert to person, place, time, event.     ED Results / Procedures / Treatments   Labs (all labs ordered are listed, but only abnormal results are displayed) Labs Reviewed  COMPREHENSIVE METABOLIC PANEL - Abnormal; Notable for the following components:      Result Value   Sodium 128 (*)    Chloride 94 (*)    Glucose, Bld 103 (*)    Calcium 8.7 (*)    Albumin 3.3 (*)    All other components within normal limits  CBC WITH DIFFERENTIAL/PLATELET  URINALYSIS, ROUTINE W REFLEX MICROSCOPIC    EKG EKG Interpretation  Date/Time:  Friday June 25 2019 12:25:44 EST Ventricular Rate:  73 PR Interval:    QRS Duration: 127 QT Interval:  437 QTC Calculation: 482 R Axis:   -72 Text Interpretation: Artifact Sinus rhythm Ventricular premature complex Nonspecific IVCD with LAD Minimal ST elevation, anterior leads since last tracing no significant change Confirmed by Eulis Foster,  Vira Agar [phone]) on 06/25/2019 1:47:11  PM   Radiology No results found.  Procedures Procedures (including critical care time)  Medications Ordered in ED Medications - No data to display  ED Course  I have reviewed the triage vital signs and the nursing notes.  Pertinent labs & imaging results that were available during my care of the patient were reviewed by me and considered in my medical decision making (see chart for details).    MDM Rules/Calculators/A&P                      Patient presenting for syncopal event.  Physical exam shows patient appears nontoxic.  He is neurologically intact.  Patient has had similar events in the past per wife.  She states this is not different from his normal syncopal event.  Patient without hypotension in the ED.  Will obtain labs.  Will obtain CT head due to recent subdurals.    Labs reassuring.  EKG unchanged from previous.  CT head shows improvement/resolution of previous subdural hemorrhages, however new area of either new bleed or redistribution.  Will consult with neurosurgery for further management.  Discussed with Dr. Annette Stable from neurosurgery who felt this was redistribution of old blood, does not feel patient has any bleed.  Does not feel there is any neurosurgical intervention that is necessary.  I discussed findings with patient and wife.  Discussed option of admission due to patient's history and his syncope.  Patient and wife would like to go home if possible, stating that this has been worked up multiple times.  I discussed importance of close follow-up with neurology if they were to go home, and prompt return if anything worsens.  Patient and wife are agreeable to this plan.  Case discussed with attending, Dr. Eulis Foster evaluated the patient.  At this time, patient appears safe for d/c. return precautions given.  Patient states he understands and agrees to plan.  Final Clinical Impression(s) / ED Diagnoses Final diagnoses:  Syncope, unspecified syncope type    Rx / DC  Orders ED Discharge Orders    None       Franchot Heidelberg, PA-C 06/25/19 1647    Daleen Bo, MD 06/27/19 1329

## 2019-06-25 NOTE — H&P (Signed)
History and Physical    Lamareon Sport [id number] DOB: 1925-06-29 DOA: 06/25/2019  PCP: [name], MD  Chief Complaint: Syncope  HPI: Joe Glover is a 85 y.o. male with medical history significant of paroxysmal atrial fibrillation, sick sinus syndrome, orthostatic hypotension and recurrent syncope, chronic hyponatremia, Parkinson's disease with autonomic dysfunction, dementia, hyperlipidemia, anemia, OSA, prostate cancer, subdural hematoma, subarachnoid hemorrhage, CVA with residual dysarthria, and conditions listed below presenting to the ED for evaluation of syncope.  Systolic was 70 upon EMS arrival.  He was given a 100 cc fluid bolus.  Patient did not fall or sustain any injuries to his head.    He was seen earlier today in the ED for the same problem and his systolic was in the 0000000 at that time.  Head CT done this morning showing stable previously demonstrated subdural hematoma along the posterior aspect of the interhemispheric falx and tentorium on the right. However, a new component of layering over the right frontoparietal convexity, measuring up to 8 mm in thickness.  The small foci of acute intraparenchymal hemorrhage in the left frontal lobe appeared improved.  No other new areas of acute intracranial hemorrhage.  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 ED provider had discussed the case with Dr. Trenton Gammon from neurosurgery who felt that this new finding on CT was redistribution of old blood loss and not a new bleed.  He did not feel that any neurosurgical intervention was necessary.  Hospital admission was offered at that time but patient and his wife declined.   Patient was recently admitted 12/10-12/11 for syncope resulting in a fall and head injury.  CT head revealed subdural hematoma and intraparenchymal hematoma.  Patient is not sure why he is at the hospital today and has not able to provide much history.  He has no complaints.  Oriented to  person and place.  He knows it is 2020.  Denies headaches, dizziness, chest pain, shortness of breath, cough, heart palpitations, nausea, vomiting, abdominal pain, or diarrhea.  No family available at this time.  ED Course: Blood pressure stable since arrival to the ED.  Afebrile and no leukocytosis.  Blood glucose 98.  Sodium 126, not significantly changed from baseline.  Initial high-sensitivity troponin negative, repeat pending.  SARS-CoV-2 PCR test pending.  Chest x-ray showing no active cardiopulmonary disease.  Received a 500 cc fluid bolus.  Review of Systems:  All systems reviewed and apart from history of presenting illness, are negative.  Past Medical History:  Diagnosis Date  . Awareness alteration, transient 11/02/2014  . Cellulitis of right hand 01/27/2017  . Colon cancer ([place])   . Essential hypertension 09/23/2012  . Fall 09/16/2016  . Fall from standing 02/25/2017  . GI bleed   . Hyperlipidemia   . Hypertension   . Insomnia 08/13/2016  . Normocytic anemia 01/27/2017  . On amiodarone therapy 08/06/2015  . Orthostatic hypotension 08/06/2015  . OSA (obstructive sleep apnea)   . PAF (paroxysmal atrial fibrillation) ([place])   . Parkinson disease ([place])   . Prostate cancer ([place])   . SDH (subdural hematoma) ([place]) 09/16/2016  . Status post placement of implantable loop recorder 07/11/2015  . Stroke ([place])   . 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 Subdural hematoma ([place]) 09/05/2016  . Syncope 11/02/2014  . TIA (transient ischemic attack)   . Vitamin D deficiency     Past Surgical  History:  Procedure Laterality Date  . COLECTOMY    . HERNIA REPAIR    . ORCHIECTOMY    . PROSTATECTOMY    . TOTAL HIP ARTHROPLASTY Right 11/11/2018   Procedure: TOTAL HIP ARTHROPLASTY ANTERIOR APPROACH;  Surgeon: Gaynelle Arabian, MD;  Location: [facility]S;  Service: Orthopedics;  Laterality: Right;  100     reports that he  quit smoking about 61 years ago. His smoking use included cigarettes. He has a 40.00 pack-year smoking history. He has never used smokeless tobacco. He reports current alcohol use. He reports that he does not use drugs.  Allergies  Allergen Reactions  . Aspirin Other (See Comments)    GI bleed  . Augmentin [Amoxicillin-Pot Clavulanate] Hives  . Hemocyte Plus [Hematinic Plus Vit-Minerals] Other (See Comments)    Unknown reaction  . Penicillins Hives    Has patient had a PCN reaction causing immediate rash, facial/tongue/throat swelling, SOB or lightheadedness with hypotension: No Has patient had a PCN reaction causing severe rash involving mucus membranes or skin necrosis: Yes Has patient had a PCN reaction that required hospitalization: No Has patient had a PCN reaction occurring within the last 10 years: No If all of the above answers are "NO", then may proceed with Cephalosporin use.    Family History  Problem Relation Age of Onset  . Parkinson's disease Mother   . Heart attack Father   . Heart attack Brother     Prior to Admission medications   Medication Sig Start Date End Date Taking? Authorizing Provider  Cholecalciferol (VITAMIN D3 PO) Take 5,000 mg by mouth daily.    [provider]  cloNIDine (CATAPRES) 0.1 MG tablet Take 1 tablet daily as needed for systolic (top number) blood pressure greater than 180 Patient taking differently: Take 0.1 mg by mouth See admin instructions. Take 1 tablet daily as needed for systolic (top number) blood pressure greater than 180 02/19/19   Munley, Hilton Cork, MD  cyanocobalamin (,VITAMIN B-12,) 1000 MCG/ML injection Inject 1,000 mcg into the muscle every 30 (thirty) days.     [provider]  EPINEPHrine 0.3 mg/0.3 mL IJ SOAJ injection Inject 0.3 mg into the muscle daily as needed (allergic reaction).    [provider]  ferrous sulfate 325 (65 FE) MG tablet Take 325 mg by mouth 3 (three) times daily with meals.      [provider]  ipratropium (ATROVENT) 0.03 % nasal spray Place 1 spray into the nose 2 (two) times daily. 08/20/17   [provider]  loratadine (CLARITIN) 10 MG tablet Take 10 mg by mouth at bedtime.     [provider]  magnesium oxide (MAG-OX) 400 MG tablet Take 800 mg by mouth 2 (two) times daily.     [provider]  Melatonin 5 MG TABS Take 5 mg by mouth at bedtime as needed (for sleep).     [provider]  midodrine (PROAMATINE) 2.5 MG tablet Take 1 tablet daily in the morning if two sequential systolic BP readings (top number) within 15 minutes are less than 100. Patient taking differently: Take 2.5 mg by mouth See admin instructions. Take 1 tablet daily in the morning if two sequential systolic BP readings (top number) within 15 minutes are less than 100. 03/09/19   Munley, Hilton Cork, MD  omeprazole (PRILOSEC) 20 MG capsule Take 20 mg by mouth daily.  04/26/14   [provider]  OnabotulinumtoxinA (BOTOX IJ) Inject as directed every 3 (three) months. Next injection  due in December 2019    [provider]  potassium chloride SA (K-DUR,KLOR-CON) 20 MEQ tablet Take 20 mEq by mouth daily.  08/04/18   [provider]  pravastatin (PRAVACHOL) 40 MG tablet Take 40 mg by mouth at bedtime.  05/07/14   [provider]  PRESCRIPTION MEDICATION Inhale into the lungs at bedtime. CPAP    [provider]  Probiotic Product (PROBIOTIC DAILY PO) Take 1 tablet by mouth at bedtime.     [provider]  psyllium (METAMUCIL) 58.6 % packet Take 1 packet by mouth 2 (two) times daily.     [provider]  sodium chloride 1 g tablet Take 1 tablet (1 g total) by mouth daily. 06/18/18   Richardo Priest, MD  temazepam (RESTORIL) 7.5 MG capsule Take 1 capsule (7.5 mg total) by mouth at bedtime as needed for sleep. 07/26/18   Quintella Reichert, MD    Physical Exam: Vitals:   06/25/19 2015 06/25/19 2030 06/25/19 2045  06/25/19 2100  BP:  (!) 165/101  (!) 161/102  Pulse: 92 89 91 92  Resp: (!) 24 19 20 20   Temp:      TempSrc:      SpO2: 100% 99% 97% 99%    Physical Exam  Constitutional: No distress.  HENT:  Head: Normocephalic.  Eyes: EOM are normal.  Cardiovascular: Normal rate, regular rhythm and intact distal pulses.  Pulmonary/Chest: Effort normal and breath sounds normal. No respiratory distress. He has no wheezes. He has no rales.  Abdominal: Soft. Bowel sounds are normal. He exhibits no distension. There is no abdominal tenderness. There is no guarding.  Musculoskeletal:        General: No edema.     Cervical back: Neck supple.  Neurological: No cranial nerve deficit.  Awake and alert Oriented to person and place.  He knows it is 2020. Moving all extremities on command.  No focal motor weakness.  Skin: Skin is warm and dry. He is not diaphoretic.     Labs on Admission: I have personally reviewed following labs and imaging studies  CBC: Recent Labs  Lab 06/25/19 1308 06/25/19 2046  WBC 4.3 5.8  NEUTROABS 2.4 4.1  HGB 13.3 13.7  HCT 39.7 41.8  MCV 83.9 85.3  PLT 191 [id number]   Basic Metabolic Panel: Recent Labs  Lab 06/25/19 1308 06/25/19 2046  NA 128* 126*  K 4.8 4.6  CL 94* 94*  CO2 25 22  GLUCOSE 103* 98  BUN 10 10  CREATININE 0.87 0.89  CALCIUM 8.7* 8.7*   GFR: Estimated Creatinine Clearance: 58.1 mL/min (by C-G formula based on SCr of 0.89 mg/dL). Liver Function Tests: Recent Labs  Lab 06/25/19 1308  AST 17  ALT 13  ALKPHOS 61  BILITOT 0.8  PROT 6.8  ALBUMIN 3.3*   No results for input(s): LIPASE, AMYLASE in the last 168 hours. No results for input(s): AMMONIA in the last 168 hours. Coagulation Profile: No results for input(s): INR, PROTIME in the last 168 hours. Cardiac Enzymes: No results for input(s): CKTOTAL, CKMB, CKMBINDEX, TROPONINI in the last 168 hours. BNP (last 3 results) No results for input(s): PROBNP in the last 8760 hours. HbA1C: No  results for input(s): HGBA1C in the last 72 hours. CBG: Recent Labs  Lab 06/25/19 2002  GLUCAP 73   Lipid Profile: No results for input(s): CHOL, HDL, LDLCALC, TRIG, CHOLHDL, LDLDIRECT in the last 72 hours. Thyroid Function Tests: No results for input(s): TSH, T4TOTAL, FREET4, T3FREE, THYROIDAB  in the last 72 hours. Anemia Panel: No results for input(s): VITAMINB12, FOLATE, FERRITIN, TIBC, IRON, RETICCTPCT in the last 72 hours. Urine analysis:    Component Value Date/Time   COLORURINE YELLOW 06/18/2019 0453   APPEARANCEUR CLEAR 06/18/2019 0453   LABSPEC 1.010 06/18/2019 0453   PHURINE 7.0 06/18/2019 [place] 06/18/2019 0453   HGBUR NEGATIVE 06/18/2019 0453   BILIRUBINUR NEGATIVE 06/18/2019 0453   [place] 06/18/2019 0453   PROTEINUR NEGATIVE 06/18/2019 0453   UROBILINOGEN 0.2 10/10/2014 1146   NITRITE NEGATIVE 06/18/2019 0453   LEUKOCYTESUR NEGATIVE 06/18/2019 0453    Radiological Exams on Admission: CT Head Wo Contrast  Result Date: 06/25/2019 CLINICAL DATA:  Near syncopal episode today. Recent hospitalization for fall with intracranial hemorrhage. EXAM: CT HEAD WITHOUT CONTRAST TECHNIQUE: Contiguous axial images were obtained from the base of the skull through the vertex without intravenous contrast. COMPARISON:  CT head 06/17/2019 and 06/18/2019. FINDINGS: Brain: The previously demonstrated subdural hematoma along the posterior aspect of the interhemispheric falx and tentorium on the right has not significantly changed. However, there is a new component layering over the right frontal and parietal convexities, best seen on the coronal images and measuring up to 8 mm in thickness (image 44/5). Both small foci of acute intraparenchymal hemorrhage in the left frontal lobe have improved compared with the prior study. No other new areas of acute intracranial hemorrhage are seen. There is no midline shift, hydrocephalus or evidence of acute stroke. Extensive  chronic small vessel ischemic changes are present in the periventricular white matter. 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 No acute vascular findings. Skull: No acute or focal findings. Sinuses/Orbits: The visualized paranasal sinuses, mastoid air cells and middle ears are clear. No acute orbital findings. Previous right lens surgery. Other: None. IMPRESSION: 1. The previously demonstrated subdural hematoma along the posterior aspect of the interhemispheric falx and tentorium on the right has not significantly changed. However, there is a new component layering over the right frontoparietal convexity, measuring up to 8 mm in thickness. 2. The small foci of acute intraparenchymal hemorrhage in the left frontal lobe have improved. 3. No other new areas of acute intracranial hemorrhage. 4. 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 5. Extensive chronic small vessel ischemic changes in the periventricular white matter. 6. These results were called by telephone at the time of interpretation on 06/25/2019 at 3:16 pm to provider [facility] , who verbally acknowledged these results. Electronically Signed   By: Richardean Sale M.D.   On: 06/25/2019 15:16   DG Chest Port 1 View  Result Date: 06/25/2019 CLINICAL DATA:  Syncope EXAM: PORTABLE CHEST 1 VIEW COMPARISON:  11/09/2018 FINDINGS: Heart is normal size. Tortuous, calcified aorta. No confluent airspace opacities or effusions. No acute bony abnormality. Loop recorder device in place. IMPRESSION: No acute cardiopulmonary disease Electronically Signed   By: Rolm Baptise M.D.   On: 06/25/2019 20:56    EKG: Independently reviewed.  Poor quality study with a lot of artifact.  Appears to be sinus rhythm with PVCs, QTC 485.  No significant change since prior tracing.  Assessment/Plan Principal Problem:   Syncope Active Problems:   OSA (obstructive sleep apnea)   Hyponatremia   PAF (paroxysmal  atrial fibrillation) (HCC)   Subdural hematoma (HCC)   Recurrent syncope History of orthostatic hypotension and is on midodrine.  Patient has had 2 episodes of syncope today.  No falls or head trauma.  Upon EMS arrival, systolic was 70 and he was given 100 cc fluid bolus.  Blood pressure has been stable/slightly hypertensive since arrival to the ED.  Infectious etiology less likely given no fever or leukocytosis.  Chest x-ray showing no active cardiopulmonary disease.  High-sensitivity troponin negative.  EKG showing sinus rhythm with PVCs, no significant change since prior tracing.  He was recently admitted for syncope and echo done 12/10 showing EF 60 to 65% no LVH, no LV regional wall motion abnormalities, trivial MR, and mild AR. -Cardiac monitoring -Continue midodrine -Check orthostatics -Fall precautions  History of subdural hematoma and intraparenchymal hemorrhage History of prior subdural hematoma.  Patient recently admitted 12/10 for syncope resulting in fall and head injury.  Head CT revealed subdural hematoma and intraparenchymal hematoma.  Repeat Head CT done this morning showing stable previously demonstrated subdural hematoma along the posterior aspect of the interhemispheric falx and tentorium on the right. However, a new component of layering over the right frontoparietal convexity, measuring up to 8 mm in thickness.  The small foci of acute intraparenchymal hemorrhage in the left frontal lobe appeared improved.  No other new areas of acute intracranial hemorrhage.  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 ED provider had discussed the case with Dr. Trenton Gammon from neurosurgery who felt that this new finding on CT was redistribution of old blood loss and not a new bleed.  He did not feel that any neurosurgical intervention was necessary.  -Frequent neurochecks -Do not give any antiplatelet agents or anticoagulation for DVT prophylaxis  Paroxysmal atrial  fibrillation Currently in sinus rhythm.  Not on any rate controlling medications.  Not a candidate for anticoagulation given advanced age, history of falls, GI bleed, and subdural hematoma/intraparenchymal hemorrhage. -Cardiac monitoring  Chronic hyponatremia Has chronic hyponatremia.  Sodium ranging between 127-130 during recent hospitalization.  Currently sodium is 126. -Continue home sodium chloride tablets  Physical deconditioning -PT evaluation  OSA -Continue CPAP  Pharmacy med rec pending.  DVT prophylaxis: SCDs Code Status: DNR based on records from prior hospitalizations in May 2020 and December 2020. Family Communication: No family available at this time. Disposition Plan: Anticipate discharge after clinical improvement. Consults called: None Admission status: It is my clinical opinion that referral for OBSERVATION is reasonable and necessary in this patient based on the above information provided. The aforementioned taken together are felt to place the patient at high risk for further clinical deterioration. However it is anticipated that the patient may be medically stable for discharge from the hospital within 24 to 48 hours.  The medical decision making on this patient was of high complexity and the patient is at high risk for clinical deterioration, therefore this is a level 3 visit.  Shela Leff MD [facility] Pager [phone]  If 7PM-7AM, please contact night-coverage [email or web address] Password [email or web address]  06/25/2019, 11:19 PM

## 2019-06-25 NOTE — ED Triage Notes (Signed)
Pt comes via [facility] for syncopal episode, pt seen here for the same today, pt was hypotensive originally upon EMS arrival, systolic of 70 palpated, now normal. Pt did not hit his head this time and did not fall

## 2019-06-25 NOTE — ED Provider Notes (Signed)
  Face-to-face evaluation   History: Here for evaluation of "passing out," while seated in his recliner.  His wife states that he was "out for 10 minutes."  She states that after that he was groggy.  She checked his blood pressure and it was 60/40.  He has had the combination of syncope and low blood pressure previously and been evaluated for multiple times.  He had a subdural last week after a fall.  Physical exam: Elderly, alert and cooperative.  He follows commands accurately.  Abrasion left forehead which appears subacute.  No other acute cranial injuries are seen.  No deformities of the upper or lower extremities.  Chest wall nontender.  Medical screening examination/treatment/procedure(s) were conducted as a shared visit with non-physician practitioner(s) and myself.  I personally evaluated the patient during the encounter    Daleen Bo, MD 06/27/19 1329

## 2019-06-25 NOTE — ED Triage Notes (Signed)
Pt brought in by EMS from home for near syncopal episode today. Per EMS pt did not fall, but after near syncopal episode wife became worried and called pt PCP who recommended pt be evaluated in the ED. Per EMS, family states pt has had syncopal episodes x15 years. Pt had syncopal episode last week that resulted in a fall. Pt was evaluated for head injury and was sent home. Pt has hx of parkinsons and a stroke. Per EMS pt has difficulty communicating, states his wife will be here shortly and is able to answer all questions.

## 2019-06-25 NOTE — ED Notes (Signed)
Pt and wife aware of need for urine sample.

## 2019-06-25 NOTE — ED Notes (Signed)
Patient verbalizes understanding of discharge instructions. Opportunity for questioning and answers were provided. Armband removed by staff, pt discharged from ED.  

## 2019-06-25 NOTE — Discharge Instructions (Addendum)
Continue taking home medications as prescribed.  Follow up with your neurologist for further evaluation.  Make sure you are staying well hydrated with water.  Return to the emergency room with repeat loss of consciousness, fever, chest pain, excessive sleepiness, or any new, worsening, or concerning symptoms.

## 2019-06-26 DIAGNOSIS — R55 Syncope and collapse: Secondary | ICD-10-CM | POA: Diagnosis present

## 2019-06-26 DIAGNOSIS — F0280 Dementia in other diseases classified elsewhere without behavioral disturbance: Secondary | ICD-10-CM | POA: Diagnosis present

## 2019-06-26 DIAGNOSIS — Z96641 Presence of right artificial hip joint: Secondary | ICD-10-CM | POA: Diagnosis present

## 2019-06-26 DIAGNOSIS — Z66 Do not resuscitate: Secondary | ICD-10-CM | POA: Diagnosis present

## 2019-06-26 DIAGNOSIS — G4733 Obstructive sleep apnea (adult) (pediatric): Secondary | ICD-10-CM | POA: Diagnosis present

## 2019-06-26 DIAGNOSIS — G4700 Insomnia, unspecified: Secondary | ICD-10-CM | POA: Diagnosis present

## 2019-06-26 DIAGNOSIS — N390 Urinary tract infection, site not specified: Secondary | ICD-10-CM | POA: Diagnosis present

## 2019-06-26 DIAGNOSIS — Z8249 Family history of ischemic heart disease and other diseases of the circulatory system: Secondary | ICD-10-CM | POA: Diagnosis not present

## 2019-06-26 DIAGNOSIS — G20 Parkinson's disease: Secondary | ICD-10-CM | POA: Diagnosis present

## 2019-06-26 DIAGNOSIS — Z8546 Personal history of malignant neoplasm of prostate: Secondary | ICD-10-CM | POA: Diagnosis not present

## 2019-06-26 DIAGNOSIS — G909 Disorder of the autonomic nervous system, unspecified: Secondary | ICD-10-CM | POA: Diagnosis present

## 2019-06-26 DIAGNOSIS — I10 Essential (primary) hypertension: Secondary | ICD-10-CM | POA: Diagnosis present

## 2019-06-26 DIAGNOSIS — I480 Paroxysmal atrial fibrillation: Secondary | ICD-10-CM | POA: Diagnosis present

## 2019-06-26 DIAGNOSIS — E876 Hypokalemia: Secondary | ICD-10-CM | POA: Diagnosis not present

## 2019-06-26 DIAGNOSIS — W19XXXD Unspecified fall, subsequent encounter: Secondary | ICD-10-CM | POA: Diagnosis present

## 2019-06-26 DIAGNOSIS — E871 Hypo-osmolality and hyponatremia: Secondary | ICD-10-CM | POA: Diagnosis present

## 2019-06-26 DIAGNOSIS — I495 Sick sinus syndrome: Secondary | ICD-10-CM | POA: Diagnosis present

## 2019-06-26 DIAGNOSIS — Z85038 Personal history of other malignant neoplasm of large intestine: Secondary | ICD-10-CM | POA: Diagnosis not present

## 2019-06-26 DIAGNOSIS — I951 Orthostatic hypotension: Secondary | ICD-10-CM | POA: Diagnosis present

## 2019-06-26 DIAGNOSIS — R296 Repeated falls: Secondary | ICD-10-CM | POA: Diagnosis present

## 2019-06-26 DIAGNOSIS — E559 Vitamin D deficiency, unspecified: Secondary | ICD-10-CM | POA: Diagnosis present

## 2019-06-26 DIAGNOSIS — D649 Anemia, unspecified: Secondary | ICD-10-CM | POA: Diagnosis present

## 2019-06-26 DIAGNOSIS — E785 Hyperlipidemia, unspecified: Secondary | ICD-10-CM | POA: Diagnosis present

## 2019-06-26 DIAGNOSIS — Z20828 Contact with and (suspected) exposure to other viral communicable diseases: Secondary | ICD-10-CM | POA: Diagnosis present

## 2019-06-26 DIAGNOSIS — B9620 Unspecified Escherichia coli [E. coli] as the cause of diseases classified elsewhere: Secondary | ICD-10-CM | POA: Diagnosis present

## 2019-06-26 LAB — BASIC METABOLIC PANEL
Anion gap: 12 (ref 5–15)
BUN: 8 mg/dL (ref 8–23)
CO2: 22 mmol/L (ref 22–32)
Calcium: 8.5 mg/dL — ABNORMAL LOW (ref 8.9–10.3)
Chloride: 97 mmol/L — ABNORMAL LOW (ref 98–111)
Creatinine, Ser: 0.78 mg/dL (ref 0.61–1.24)
GFR calc Af Amer: >60 mL/min (ref >60)
GFR calc non Af Amer: >60 mL/min (ref >60)
Glucose, Bld: 77 mg/dL (ref 70–99)
Potassium: 4.2 mmol/L (ref 3.5–5.1)
Sodium: 131 mmol/L — ABNORMAL LOW (ref 135–145)

## 2019-06-26 LAB — SARS CORONAVIRUS 2 (TAT 6-24 HRS): SARS Coronavirus 2: NEGATIVE

## 2019-06-26 MED ORDER — MIDODRINE HCL 5 MG PO TABS: 2.5000 mg | ORAL_TABLET | Freq: Three times a day (TID) | ORAL | Status: DC

## 2019-06-26 MED ORDER — PRAVASTATIN SODIUM 40 MG PO TABS
40.0000 mg | ORAL_TABLET | Freq: Every day | ORAL | Status: DC
  Administered 2019-06-26 – 2019-07-04 (×9): 40 mg via ORAL
  Filled 2019-06-26 (×9): qty 1

## 2019-06-26 MED ORDER — TEMAZEPAM 7.5 MG PO CAPS
7.5000 mg | ORAL_CAPSULE | Freq: Every evening | ORAL | Status: DC | PRN
  Administered 2019-06-26 – 2019-07-04 (×9): 7.5 mg via ORAL
  Filled 2019-06-26 (×9): qty 1

## 2019-06-26 MED ORDER — MELATONIN 3 MG PO TABS
4.5000 mg | ORAL_TABLET | Freq: Every day | ORAL | Status: DC
  Administered 2019-06-26 – 2019-07-04 (×9): 4.5 mg via ORAL
  Filled 2019-06-26 (×10): qty 1.5

## 2019-06-26 NOTE — Progress Notes (Signed)
PT Cancellation Note  Patient Details Name: Joe Glover MRN: [id number] DOB: 1924-10-11   Cancelled Treatment:    Reason Eval/Treat Not Completed: Medical issues which prohibited therapy.  Pt was up to [facility] with nursing and had syncopal episode, will reattempt at another time.   Ramond Dial 06/26/2019, 1:35 PM   Mee Hives, PT MS Acute Rehab Dept. Number: [place]

## 2019-06-26 NOTE — Progress Notes (Signed)
PROGRESS NOTE    Joe Glover  [id number] DOB: 1924/09/19 DOA: 06/25/2019 PCP: [name], MD     Brief Narrative:  Joe Glover is a 85 y.o. male with medical history significant of paroxysmal atrial fibrillation, sick sinus syndrome, orthostatic hypotension and recurrent syncope, chronic hyponatremia, Parkinson's disease with autonomic dysfunction, dementia, hyperlipidemia, anemia, OSA, prostate cancer, subdural hematoma, subarachnoid hemorrhage, CVA with residual dysarthria, and conditions listed below presenting to the ED for evaluation of syncope.  Systolic was 70 upon EMS arrival.  He was given fluid bolus.  Patient did not fall or sustain any injuries to his head.    He was seen earlier same day in the ED for the same problem and his systolic was in the 0000000 at that time.  Head CT showing stable previously demonstrated subdural hematoma along the posterior aspect of the interhemispheric falx and tentorium on the right. However, a new component of layering over the right frontoparietal convexity, measuring up to 8 mm in thickness.  The small foci of acute intraparenchymal hemorrhage in the left frontal lobe appeared improved.  No other new areas of acute intracranial hemorrhage.  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 ED provider had discussed the case with Dr. Trenton Gammon from neurosurgery who felt that this new finding on CT was redistribution of old blood loss and not a new bleed.  He did not feel that any neurosurgical intervention was necessary.  Hospital admission was offered at that time but patient and his wife declined.   Patient was recently admitted 12/10-12/11 for syncope resulting in a fall and head injury.  CT head revealed subdural hematoma and intraparenchymal hematoma.  Patient is now admitted for recurrent orthostatic hypotension and syncope.   New events last 24 hours / Subjective: Patient evaluated this morning, had no complaints  but had not been out of bed yet.   This afternoon, I was notified by RN that patient had a witnessed syncopal episode while on bedside commode. Did not suffer injury. SBP 60 during episode.   Assessment & Plan:   Principal Problem:   Syncope Active Problems:   OSA (obstructive sleep apnea)   Hyponatremia   PAF (paroxysmal atrial fibrillation) (HCC)   Subdural hematoma (HCC)   Recurrent syncope with orthostatic hypotension -Schedule midodrine (was on PRN previously) -Monitor orthostatic VS   History of subdural hematoma and intraparenchymal hemorrhage -Patient recently admitted 12/10 for syncope resulting in fall and head injury.  Head CT revealed subdural hematoma and intraparenchymal hematoma.  Repeat Head CT showing stable previously demonstrated subdural hematoma along the posterior aspect of the interhemispheric falx and tentorium on the right. However, a new component of layering over the right frontoparietal convexity, measuring up to 8 mm in thickness.  The small foci of acute intraparenchymal hemorrhage in the left frontal lobe appeared improved.  No other new areas of acute intracranial hemorrhage.  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 ED provider had discussed the case with Dr. Trenton Gammon from neurosurgery who felt that this new finding on CT was redistribution of old blood loss and not a new bleed.  He did not feel that any neurosurgical intervention was necessary -Hold off on any antiplatelet/anticoagulants   Paroxysmal atrial fibrillation -Currently in sinus rhythm.  Not on any rate controlling medications.  Not a candidate for anticoagulation given advanced age, history of falls, GI bleed, and subdural hematoma/intraparenchymal hemorrhage  Chronic hyponatremia -Has chronic hyponatremia.  Sodium ranging between 127-130 during  recent hospitalization -Continue home sodium chloride tablets  Physical deconditioning -PT evaluation  OSA -Continue  CPAP  Parkinson's disease with dementia  -Has some mild cognitive impairment    DVT prophylaxis: SCD Code Status: DNR Family Communication: None at bedside Disposition Plan: Pending clinical improvement. Had a syncopal episode this afternoon. Will change midodrine to scheduled dose rather than PRN and continue to monitor closely. Not ready for discharge home. Change to inpatient status.    Consultants:   None  Procedures:   None   Antimicrobials:  Anti-infectives (From admission, onward)   None        Objective: Vitals:   06/25/19 2230 06/25/19 2300 06/26/19 0017 06/26/19 0908  BP: (!) 146/111 (!) 140/98 (!) 142/103 (!) 159/97  Pulse:   95 100  Resp: 16 14 15 16   Temp:   98 F (36.7 C) 97.7 F (36.5 C)  TempSrc:   Oral Oral  SpO2:   97% 97%  Weight:   66.1 kg     Intake/Output Summary (Last 24 hours) at 06/26/2019 1229 Last data filed at 06/26/2019 [id number] Gross per 24 hour  Intake 1132.2 ml  Output 1000 ml  Net 132.2 ml   Filed Weights   06/26/19 0017  Weight: 66.1 kg    Examination:  General exam: Appears calm and comfortable  Respiratory system: Clear to auscultation. Respiratory effort normal. No respiratory distress. No conversational dyspnea.  Cardiovascular system: S1 & S2 heard, RRR. No murmurs. No pedal edema. Gastrointestinal system: Abdomen is nondistended, soft and nontender. Normal bowel sounds heard. Central nervous system: Alert and oriented. Extremities: Symmetric in appearance  Skin: No rashes, lesions or ulcers on exposed skin   Data Reviewed: I have personally reviewed following labs and imaging studies  CBC: Recent Labs  Lab 06/25/19 1308 06/25/19 2046  WBC 4.3 5.8  NEUTROABS 2.4 4.1  HGB 13.3 13.7  HCT 39.7 41.8  MCV 83.9 85.3  PLT 191 [id number]   Basic Metabolic Panel: Recent Labs  Lab 06/25/19 1308 06/25/19 2046 06/26/19 0459  NA 128* 126* 131*  K 4.8 4.6 4.2  CL 94* 94* 97*  CO2 25 22 22   GLUCOSE 103* 98 77  BUN  10 10 8   CREATININE 0.87 0.89 0.78  CALCIUM 8.7* 8.7* 8.5*   GFR: Estimated Creatinine Clearance: 55.1 mL/min (by C-G formula based on SCr of 0.78 mg/dL). Liver Function Tests: Recent Labs  Lab 06/25/19 1308  AST 17  ALT 13  ALKPHOS 61  BILITOT 0.8  PROT 6.8  ALBUMIN 3.3*   No results for input(s): LIPASE, AMYLASE in the last 168 hours. No results for input(s): AMMONIA in the last 168 hours. Coagulation Profile: No results for input(s): INR, PROTIME in the last 168 hours. Cardiac Enzymes: No results for input(s): CKTOTAL, CKMB, CKMBINDEX, TROPONINI in the last 168 hours. BNP (last 3 results) No results for input(s): PROBNP in the last 8760 hours. HbA1C: No results for input(s): HGBA1C in the last 72 hours. CBG: Recent Labs  Lab 06/25/19 2002  GLUCAP 73   Lipid Profile: No results for input(s): CHOL, HDL, LDLCALC, TRIG, CHOLHDL, LDLDIRECT in the last 72 hours. Thyroid Function Tests: No results for input(s): TSH, T4TOTAL, FREET4, T3FREE, THYROIDAB in the last 72 hours. Anemia Panel: No results for input(s): VITAMINB12, FOLATE, FERRITIN, TIBC, IRON, RETICCTPCT in the last 72 hours. Sepsis Labs: No results for input(s): PROCALCITON, LATICACIDVEN in the last 168 hours.  Recent Results (from the past 240 hour(s))  SARS CORONAVIRUS 2 (TAT 6-24  HRS) Nasopharyngeal Nasopharyngeal Swab     Status: None   Collection Time: 06/17/19 11:55 PM   Specimen: Nasopharyngeal Swab  Result Value Ref Range Status   SARS Coronavirus 2 NEGATIVE NEGATIVE Final    Comment: (NOTE) SARS-CoV-2 target nucleic acids are NOT DETECTED. The SARS-CoV-2 RNA is generally detectable in upper and lower respiratory specimens during the acute phase of infection. Negative results do not preclude SARS-CoV-2 infection, do not rule out co-infections with other pathogens, and should not be used as the sole basis for treatment or other patient management decisions. Negative results must be combined with  clinical observations, patient history, and epidemiological information. The expected result is Negative. Fact Sheet for Patients: SugarRoll.be Fact Sheet for Healthcare Providers: https://www.woods-mathews.com/ This test is not yet approved or cleared by the [place] FDA and  has been authorized for detection and/or diagnosis of SARS-CoV-2 by FDA under an Emergency Use Authorization (EUA). This EUA will remain  in effect (meaning this test can be used) for the duration of the COVID-19 declaration under Section 56 4(b)(1) of the Act, 21 U.S.C. section 360bbb-3(b)(1), unless the authorization is terminated or revoked sooner. Performed at [facility], [place] [address] [place], [place] [address]   SARS CORONAVIRUS 2 (TAT 6-24 HRS) Nasopharyngeal Nasopharyngeal Swab     Status: None   Collection Time: 06/25/19  8:46 PM   Specimen: Nasopharyngeal Swab  Result Value Ref Range Status   SARS Coronavirus 2 NEGATIVE NEGATIVE Final    Comment: (NOTE) SARS-CoV-2 target nucleic acids are NOT DETECTED. The SARS-CoV-2 RNA is generally detectable in upper and lower respiratory specimens during the acute phase of infection. Negative results do not preclude SARS-CoV-2 infection, do not rule out co-infections with other pathogens, and should not be used as the sole basis for treatment or other patient management decisions. Negative results must be combined with clinical observations, patient history, and epidemiological information. The expected result is Negative. Fact Sheet for Patients: SugarRoll.be Fact Sheet for Healthcare Providers: https://www.woods-mathews.com/ This test is not yet approved or cleared by the [place] FDA and  has been authorized for detection and/or diagnosis of SARS-CoV-2 by FDA under an Emergency Use Authorization (EUA). This EUA will remain  in effect (meaning this test can  be used) for the duration of the COVID-19 declaration under Section 56 4(b)(1) of the Act, 21 U.S.C. section 360bbb-3(b)(1), unless the authorization is terminated or revoked sooner. Performed at [facility], [place] [address] [place], [place] [address]       Radiology Studies: CT Head Wo Contrast  Result Date: 06/25/2019 CLINICAL DATA:  Near syncopal episode today. Recent hospitalization for fall with intracranial hemorrhage. EXAM: CT HEAD WITHOUT CONTRAST TECHNIQUE: Contiguous axial images were obtained from the base of the skull through the vertex without intravenous contrast. COMPARISON:  CT head 06/17/2019 and 06/18/2019. FINDINGS: Brain: The previously demonstrated subdural hematoma along the posterior aspect of the interhemispheric falx and tentorium on the right has not significantly changed. However, there is a new component layering over the right frontal and parietal convexities, best seen on the coronal images and measuring up to 8 mm in thickness (image 44/5). Both small foci of acute intraparenchymal hemorrhage in the left frontal lobe have improved compared with the prior study. No other new areas of acute intracranial hemorrhage are seen. There is no midline shift, hydrocephalus or evidence of acute stroke. Extensive chronic small vessel ischemic changes are present in the periventricular white matter. Vascular: Stable aneurysms  of the basilar and left middle cerebral arteries. No acute vascular findings. Skull: No acute or focal findings. Sinuses/Orbits: The visualized paranasal sinuses, mastoid air cells and middle ears are clear. No acute orbital findings. Previous right lens surgery. Other: None. IMPRESSION: 1. The previously demonstrated subdural hematoma along the posterior aspect of the interhemispheric falx and tentorium on the right has not significantly changed. However, there is a new component layering over the right frontoparietal convexity, measuring up to 8 mm in  thickness. 2. The small foci of acute intraparenchymal hemorrhage in the left frontal lobe have improved. 3. No other new areas of acute intracranial hemorrhage. 4. 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 5. Extensive chronic small vessel ischemic changes in the periventricular white matter. 6. These results were called by telephone at the time of interpretation on 06/25/2019 at 3:16 pm to provider [facility] , who verbally acknowledged these results. Electronically Signed   By: Richardean Sale M.D.   On: 06/25/2019 15:16   DG Chest Port 1 View  Result Date: 06/25/2019 CLINICAL DATA:  Syncope EXAM: PORTABLE CHEST 1 VIEW COMPARISON:  11/09/2018 FINDINGS: Heart is normal size. Tortuous, calcified aorta. No confluent airspace opacities or effusions. No acute bony abnormality. Loop recorder device in place. IMPRESSION: No acute cardiopulmonary disease Electronically Signed   By: Rolm Baptise M.D.   On: 06/25/2019 20:56      Scheduled Meds: . sodium chloride flush  3 mL Intravenous Q12H  . sodium chloride  1 g Oral Q breakfast   Continuous Infusions:   LOS: 0 days      Time spent: 40 minutes   Dessa Phi, DO [facility] 06/26/2019, 12:29 PM   Available via Epic secure chat 7am-7pm After these hours, please refer to coverage provider listed on [email or web address]

## 2019-06-27 LAB — BASIC METABOLIC PANEL
Anion gap: 12 (ref 5–15)
BUN: 13 mg/dL (ref 8–23)
CO2: 21 mmol/L — ABNORMAL LOW (ref 22–32)
Calcium: 8.6 mg/dL — ABNORMAL LOW (ref 8.9–10.3)
Chloride: 97 mmol/L — ABNORMAL LOW (ref 98–111)
Creatinine, Ser: 0.9 mg/dL (ref 0.61–1.24)
GFR calc Af Amer: >60 mL/min (ref >60)
GFR calc non Af Amer: >60 mL/min (ref >60)
Glucose, Bld: 95 mg/dL (ref 70–99)
Potassium: 3.5 mmol/L (ref 3.5–5.1)
Sodium: 130 mmol/L — ABNORMAL LOW (ref 135–145)

## 2019-06-27 MED ORDER — PANTOPRAZOLE SODIUM 40 MG PO TBEC
40.0000 mg | DELAYED_RELEASE_TABLET | Freq: Every day | ORAL | Status: DC
  Administered 2019-06-27 – 2019-07-05 (×9): 40 mg via ORAL
  Filled 2019-06-27 (×9): qty 1

## 2019-06-27 MED ORDER — MIDODRINE HCL 5 MG PO TABS
2.5000 mg | ORAL_TABLET | Freq: Every day | ORAL | Status: DC | PRN
  Administered 2019-06-27: 2.5 mg via ORAL
  Filled 2019-06-27: qty 1

## 2019-06-27 MED ORDER — FERROUS SULFATE 325 (65 FE) MG PO TABS
325.0000 mg | ORAL_TABLET | Freq: Three times a day (TID) | ORAL | Status: DC
  Administered 2019-06-27 – 2019-07-05 (×23): 325 mg via ORAL
  Filled 2019-06-27 (×23): qty 1

## 2019-06-27 MED ORDER — PSYLLIUM 95 % PO PACK
1.0000 | PACK | Freq: Two times a day (BID) | ORAL | Status: DC
  Administered 2019-06-27 – 2019-07-05 (×15): 1 via ORAL
  Filled 2019-06-27 (×17): qty 1

## 2019-06-27 MED ORDER — SODIUM CHLORIDE 0.9 % IV BOLUS
1000.0000 mL | Freq: Once | INTRAVENOUS | Status: AC
  Administered 2019-06-27: 1000 mL via INTRAVENOUS

## 2019-06-27 MED ORDER — LORATADINE 10 MG PO TABS
10.0000 mg | ORAL_TABLET | Freq: Every day | ORAL | Status: DC
  Administered 2019-06-27 – 2019-07-04 (×8): 10 mg via ORAL
  Filled 2019-06-27 (×8): qty 1

## 2019-06-27 NOTE — Progress Notes (Signed)
PROGRESS NOTE    Joe Glover  [id number] DOB: Nov 26, 1924 DOA: 06/25/2019 PCP: [name], MD     Brief Narrative:  Joe Glover is a 85 y.o. male with medical history significant of paroxysmal atrial fibrillation, sick sinus syndrome, orthostatic hypotension and recurrent syncope, chronic hyponatremia, Parkinson's disease with autonomic dysfunction, dementia, hyperlipidemia, anemia, OSA, prostate cancer, subdural hematoma, subarachnoid hemorrhage, CVA with residual dysarthria, and conditions listed below presenting to the ED for evaluation of syncope.  Systolic was 70 upon EMS arrival.  He was given fluid bolus.  Patient did not fall or sustain any injuries to his head.    He was seen earlier same day in the ED for the same problem and his systolic was in the 0000000 at that time.  Head CT showing stable previously demonstrated subdural hematoma along the posterior aspect of the interhemispheric falx and tentorium on the right. However, a new component of layering over the right frontoparietal convexity, measuring up to 8 mm in thickness.  The small foci of acute intraparenchymal hemorrhage in the left frontal lobe appeared improved.  No other new areas of acute intracranial hemorrhage.  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 ED provider had discussed the case with Dr. Trenton Gammon from neurosurgery who felt that this new finding on CT was redistribution of old blood loss and not a new bleed.  He did not feel that any neurosurgical intervention was necessary.  Hospital admission was offered at that time but patient and his wife declined.   Patient was recently admitted 12/10-12/11 for syncope resulting in a fall and head injury.  CT head revealed subdural hematoma and intraparenchymal hematoma.  Patient is now admitted for recurrent orthostatic hypotension and syncope.   12/19: notified by RN that patient had a witnessed syncopal episode while on bedside  commode. Did not suffer injury. SBP 60 during episode.   New events last 24 hours / Subjective: Patient resting on my initial exam. Wife at bedside. Discussed utilization for midodrine (she had declined this medication yesterday).   Rapid response called later this morning due to resting hypotension with SBP 70s, less responsive on exam. Patient re-examined with rapid response RN at bedside. He is arousable to voice but remains lethargic. Discussed plan of care with wife.   Assessment & Plan:   Principal Problem:   Syncope Active Problems:   OSA (obstructive sleep apnea)   Hyponatremia   PAF (paroxysmal atrial fibrillation) (HCC)   Subdural hematoma (HCC)   Recurrent syncope with orthostatic hypotension, thought to be secondary to severe autonomic dysfunction from Parkinson's disease -Follows with Dr. [name], cardiology, as an outpatient -Give 1L IVF bolus and midodrine this morning  -Monitor closely, move to progressive unit status   History of subdural hematoma and intraparenchymal hemorrhage -Patient recently admitted 12/10 for syncope resulting in fall and head injury.  Head CT revealed subdural hematoma and intraparenchymal hematoma.  Repeat Head CT showing stable previously demonstrated subdural hematoma along the posterior aspect of the interhemispheric falx and tentorium on the right. However, a new component of layering over the right frontoparietal convexity, measuring up to 8 mm in thickness.  The small foci of acute intraparenchymal hemorrhage in the left frontal lobe appeared improved.  No other new areas of acute intracranial hemorrhage.  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 ED provider had discussed the case with Dr. Trenton Gammon from neurosurgery who felt that this new finding on CT was redistribution of old blood  loss and not a new bleed.  He did not feel that any neurosurgical intervention was necessary -Hold off on any antiplatelet/anticoagulants     Paroxysmal atrial fibrillation -Currently in sinus rhythm.  Not on any rate controlling medications.  Not a candidate for anticoagulation given advanced age, history of falls, GI bleed, and subdural hematoma/intraparenchymal hemorrhage  Chronic hyponatremia -Has chronic hyponatremia.  Sodium ranging between 127-130 during recent hospitalization -Continue home sodium chloride tablets -Remains stable  Physical deconditioning -PT evaluation  OSA -Continue CPAP  Parkinson's disease with dementia  -Has some mild cognitive impairment    DVT prophylaxis: SCD Code Status: DNR Family Communication: Wife at bedside Disposition Plan: Unstable for discharge home yet.  Continues to have hypotensive episodes.  Give 1 L IV fluid bolus, start midodrine and continue to monitor in progressive unit.   Consultants:   None  Procedures:   None   Antimicrobials:  Anti-infectives (From admission, onward)   None       Objective: Vitals:   06/27/19 1008 06/27/19 1018 06/27/19 1034 06/27/19 1048  BP: 114/81 116/87 124/78 137/79  Pulse:      Resp:      Temp:      TempSrc:      SpO2:      Weight:        Intake/Output Summary (Last 24 hours) at 06/27/2019 1112 Last data filed at 06/27/2019 0931 Gross per 24 hour  Intake 483 ml  Output --  Net 483 ml   Filed Weights   06/26/19 0017 06/27/19 0447  Weight: 66.1 kg 67.8 kg    Examination: General exam: Appears calm and comfortable  Respiratory system: Clear to auscultation. Respiratory effort normal. Cardiovascular system: S1 & S2 heard, RRR. No pedal edema. Gastrointestinal system: Abdomen is nondistended, soft and nontender. Normal bowel sounds heard. Central nervous system: Alert to voice Extremities: Symmetric in appearance bilaterally  Skin: No rashes, lesions or ulcers on exposed skin  Psychiatry: History of mild cognitive impairment due to Parkinson's disease at baseline   Data Reviewed: I have personally  reviewed following labs and imaging studies  CBC: Recent Labs  Lab 06/25/19 1308 06/25/19 2046  WBC 4.3 5.8  NEUTROABS 2.4 4.1  HGB 13.3 13.7  HCT 39.7 41.8  MCV 83.9 85.3  PLT 191 [id number]   Basic Metabolic Panel: Recent Labs  Lab 06/25/19 1308 06/25/19 2046 06/26/19 0459 06/27/19 0351  NA 128* 126* 131* 130*  K 4.8 4.6 4.2 3.5  CL 94* 94* 97* 97*  CO2 25 22 22  21*  GLUCOSE 103* 98 77 95  BUN 10 10 8 13   CREATININE 0.87 0.89 0.78 0.90  CALCIUM 8.7* 8.7* 8.5* 8.6*   GFR: Estimated Creatinine Clearance: 50.2 mL/min (by C-G formula based on SCr of 0.9 mg/dL). Liver Function Tests: Recent Labs  Lab 06/25/19 1308  AST 17  ALT 13  ALKPHOS 61  BILITOT 0.8  PROT 6.8  ALBUMIN 3.3*   No results for input(s): LIPASE, AMYLASE in the last 168 hours. No results for input(s): AMMONIA in the last 168 hours. Coagulation Profile: No results for input(s): INR, PROTIME in the last 168 hours. Cardiac Enzymes: No results for input(s): CKTOTAL, CKMB, CKMBINDEX, TROPONINI in the last 168 hours. BNP (last 3 results) No results for input(s): PROBNP in the last 8760 hours. HbA1C: No results for input(s): HGBA1C in the last 72 hours. CBG: Recent Labs  Lab 06/25/19 2002  GLUCAP 73   Lipid Profile: No results for input(s): CHOL, HDL,  LDLCALC, TRIG, CHOLHDL, LDLDIRECT in the last 72 hours. Thyroid Function Tests: No results for input(s): TSH, T4TOTAL, FREET4, T3FREE, THYROIDAB in the last 72 hours. Anemia Panel: No results for input(s): VITAMINB12, FOLATE, FERRITIN, TIBC, IRON, RETICCTPCT in the last 72 hours. Sepsis Labs: No results for input(s): PROCALCITON, LATICACIDVEN in the last 168 hours.  Recent Results (from the past 240 hour(s))  SARS CORONAVIRUS 2 (TAT 6-24 HRS) Nasopharyngeal Nasopharyngeal Swab     Status: None   Collection Time: 06/17/19 11:55 PM   Specimen: Nasopharyngeal Swab  Result Value Ref Range Status   SARS Coronavirus 2 NEGATIVE NEGATIVE Final    Comment:  (NOTE) SARS-CoV-2 target nucleic acids are NOT DETECTED. The SARS-CoV-2 RNA is generally detectable in upper and lower respiratory specimens during the acute phase of infection. Negative results do not preclude SARS-CoV-2 infection, do not rule out co-infections with other pathogens, and should not be used as the sole basis for treatment or other patient management decisions. Negative results must be combined with clinical observations, patient history, and epidemiological information. The expected result is Negative. Fact Sheet for Patients: SugarRoll.be Fact Sheet for Healthcare Providers: https://www.woods-mathews.com/ This test is not yet approved or cleared by the [place] FDA and  has been authorized for detection and/or diagnosis of SARS-CoV-2 by FDA under an Emergency Use Authorization (EUA). This EUA will remain  in effect (meaning this test can be used) for the duration of the COVID-19 declaration under Section 56 4(b)(1) of the Act, 21 U.S.C. section 360bbb-3(b)(1), unless the authorization is terminated or revoked sooner. Performed at [facility], [place] [address] [place], [place] [address]   SARS CORONAVIRUS 2 (TAT 6-24 HRS) Nasopharyngeal Nasopharyngeal Swab     Status: None   Collection Time: 06/25/19  8:46 PM   Specimen: Nasopharyngeal Swab  Result Value Ref Range Status   SARS Coronavirus 2 NEGATIVE NEGATIVE Final    Comment: (NOTE) SARS-CoV-2 target nucleic acids are NOT DETECTED. The SARS-CoV-2 RNA is generally detectable in upper and lower respiratory specimens during the acute phase of infection. Negative results do not preclude SARS-CoV-2 infection, do not rule out co-infections with other pathogens, and should not be used as the sole basis for treatment or other patient management decisions. Negative results must be combined with clinical observations, patient history, and epidemiological information. The  expected result is Negative. Fact Sheet for Patients: SugarRoll.be Fact Sheet for Healthcare Providers: https://www.woods-mathews.com/ This test is not yet approved or cleared by the [place] FDA and  has been authorized for detection and/or diagnosis of SARS-CoV-2 by FDA under an Emergency Use Authorization (EUA). This EUA will remain  in effect (meaning this test can be used) for the duration of the COVID-19 declaration under Section 56 4(b)(1) of the Act, 21 U.S.C. section 360bbb-3(b)(1), unless the authorization is terminated or revoked sooner. Performed at [facility], [place] [address] [place], [place] [address]       Radiology Studies: CT Head Wo Contrast  Result Date: 06/25/2019 CLINICAL DATA:  Near syncopal episode today. Recent hospitalization for fall with intracranial hemorrhage. EXAM: CT HEAD WITHOUT CONTRAST TECHNIQUE: Contiguous axial images were obtained from the base of the skull through the vertex without intravenous contrast. COMPARISON:  CT head 06/17/2019 and 06/18/2019. FINDINGS: Brain: The previously demonstrated subdural hematoma along the posterior aspect of the interhemispheric falx and tentorium on the right has not significantly changed. However, there is a new component layering over the right frontal and parietal convexities, best seen  on the coronal images and measuring up to 8 mm in thickness (image 44/5). Both small foci of acute intraparenchymal hemorrhage in the left frontal lobe have improved compared with the prior study. No other new areas of acute intracranial hemorrhage are seen. There is no midline shift, hydrocephalus or evidence of acute stroke. Extensive chronic small vessel ischemic changes are present in the periventricular white matter. 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 No acute vascular findings. Skull: No acute or focal findings. Sinuses/Orbits: The  visualized paranasal sinuses, mastoid air cells and middle ears are clear. No acute orbital findings. Previous right lens surgery. Other: None. IMPRESSION: 1. The previously demonstrated subdural hematoma along the posterior aspect of the interhemispheric falx and tentorium on the right has not significantly changed. However, there is a new component layering over the right frontoparietal convexity, measuring up to 8 mm in thickness. 2. The small foci of acute intraparenchymal hemorrhage in the left frontal lobe have improved. 3. No other new areas of acute intracranial hemorrhage. 4. 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 5. Extensive chronic small vessel ischemic changes in the periventricular white matter. 6. These results were called by telephone at the time of interpretation on 06/25/2019 at 3:16 pm to provider [facility] , who verbally acknowledged these results. Electronically Signed   By: Richardean Sale M.D.   On: 06/25/2019 15:16   DG Chest Port 1 View  Result Date: 06/25/2019 CLINICAL DATA:  Syncope EXAM: PORTABLE CHEST 1 VIEW COMPARISON:  11/09/2018 FINDINGS: Heart is normal size. Tortuous, calcified aorta. No confluent airspace opacities or effusions. No acute bony abnormality. Loop recorder device in place. IMPRESSION: No acute cardiopulmonary disease Electronically Signed   By: Rolm Baptise M.D.   On: 06/25/2019 20:56      Scheduled Meds: . ferrous sulfate  325 mg Oral TID WC  . loratadine  10 mg Oral QHS  . Melatonin  4.5 mg Oral QHS  . pantoprazole  40 mg Oral Daily  . pravastatin  40 mg Oral QHS  . psyllium  1 packet Oral BID  . sodium chloride flush  3 mL Intravenous Q12H  . sodium chloride  1 g Oral Q breakfast   Continuous Infusions:   LOS: 1 day      Time spent: 40 minutes   Dessa Phi, DO [facility] 06/27/2019, 11:12 AM   Available via Epic secure chat 7am-7pm After these hours, please refer to coverage provider  listed on [email or web address]

## 2019-06-27 NOTE — Plan of Care (Signed)
  Problem: Nutrition: Goal: Adequate nutrition will be maintained Outcome: Progressing Note: Patient has a healthy appetite and eats the majority of his meals.   Problem: Elimination: Goal: Will not experience complications related to urinary retention Outcome: Progressing Note: Voids without difficulty.  No s/s of urinary retention noted.

## 2019-06-27 NOTE — Significant Event (Addendum)
Rapid Response Event Note  Overview: Hypotension - AMS and Decreased LOC  Initial Focused Assessment: Received a call from the nurse with concerns of patient's SBP being in the 70s. Per nurse, patient was admitted low BP and syncope. Patient is not as responsive and was quite lethargic per nurse and wife. I asked the nurse to start 500 cc NS Bolus and she was informing the MD as well.  Upon arrival, SBP was in 90s, 1L NS bolus was started, MD ordered Midodrine 2.5 mg PO as well. I started another IV as well. Patient was not in acute distress, other VS were stable. BP improved into the SBP 110-120 range.   Interventions: -- 1L NS Bolus -- Midodrine 2.5 mg   Plan of Care: -- Monitor VS - recheck BP frequently until it normalizes. -- Rest per MD.   Event Summary:   Call Time [phone] Arrival Time [phone] ( I was talking to another provider for another patient) End Time [place], [place]

## 2019-06-27 NOTE — Evaluation (Signed)
Physical Therapy Evaluation Patient Details Name: Joe Glover MRN: [id number] DOB: 05-12-1925 Today's Date: 06/27/2019   History of Present Illness  85 y.o. male with medical history significant of paroxysmal atrial fibrillation, sick sinus syndrome, orthostatic hypotension and recurrent syncope, chronic hyponatremia, Parkinson's disease with autonomic dysfunction, dementia, hyperlipidemia, anemia, OSA, prostate cancer, subdural hematoma, subarachnoid hemorrhage, CVA with residual dysarthria, and conditions listed below presenting to the ED for evaluation of syncope.  Clinical Impression  Pt presents to PT with deficits in strength, power, endurance, balance, functional mobility. PT evaluation limited due to RN requesting bed-level activity after recent orthostatic event today. Pt tolerates bed mobility and therapeutic exercise well without symptoms. Pt will benefit from further assessment of OOB mobility as well as continued exercise and balance training to reduce falls risk.    Follow Up Recommendations Home health PT;Supervision/Assistance - 24 hour    Equipment Recommendations  None recommended by PT(pt owns necessary DME)    Recommendations for Other Services       Precautions / Restrictions Precautions Precautions: Fall Precaution Comments: h/o syncope Restrictions Weight Bearing Restrictions: No      Mobility  Bed Mobility Overal bed mobility: Needs Assistance Bed Mobility: Supine to Sit;Sit to Supine     Supine to sit: Min assist Sit to supine: Min assist      Transfers Overall transfer level: (deferred 2/2 recent orthostatic event)                  Ambulation/Gait                Stairs            Wheelchair Mobility    Modified Rankin (Stroke Patients Only)       Balance Overall balance assessment: Needs assistance Sitting-balance support: Bilateral upper extremity supported;Feet supported Sitting balance-Leahy Scale:  Fair Sitting balance - Comments: minA-minG Postural control: Posterior lean                                   Pertinent Vitals/Pain Pain Assessment: No/denies pain    Home Living Family/patient expects to be discharged to:: Private residence Living Arrangements: Spouse/significant other Available Help at Discharge: Family;Available 24 hours/day Type of Home: House Home Access: Level entry;Ramped entrance     Home Layout: Multi-level Home Equipment: Walker - 2 wheels;Cane - single point;Bedside commode;Grab bars - tub/shower;Shower seat;Hand held shower head;Grab bars - toilet;Transport chair Additional Comments: Has a RW and BSC on every level    Prior Function Level of Independence: Needs assistance   Gait / Transfers Assistance Needed: Pt amb with RW. Wife provides supervision to min assist.  ADL's / Homemaking Assistance Needed: wife assists with all ADLs.  Comments: pt transitioning to HHPT per wife     Hand Dominance   Dominant Hand: Right    Extremity/Trunk Assessment   Upper Extremity Assessment Upper Extremity Assessment: Generalized weakness    Lower Extremity Assessment Lower Extremity Assessment: Generalized weakness    Cervical / Trunk Assessment Cervical / Trunk Assessment: Kyphotic  Communication   Communication: (difficult to understand at times)  Cognition Arousal/Alertness: Awake/alert Behavior During Therapy: WFL for tasks assessed/performed Overall Cognitive Status: Within Functional Limits for tasks assessed  General Comments General comments (skin integrity, edema, etc.): Pt BP: supine-135/99 (109), sitting 114/84 (93) asymptomatic, supine 156/93 (109), bed in chair position 125/95 asymptomatic    Exercises General Exercises - Lower Extremity Ankle Circles/Pumps: AROM;Both;10 reps Gluteal Sets: AROM;Both;10 reps Long Arc Quad: AROM;Both;15 reps Heel Slides:  AROM;Both;5 reps Hip ABduction/ADduction: AROM;Both;10 reps Straight Leg Raises: AROM;Both;10 reps   Assessment/Plan    PT Assessment Patient needs continued PT services  PT Problem List Decreased strength;Decreased activity tolerance;Decreased balance;Decreased mobility;Decreased knowledge of use of DME;Cardiopulmonary status limiting activity       PT Treatment Interventions Therapeutic activities;Gait training;Therapeutic exercise;Patient/family education;Balance training;Stair training;Functional mobility training;DME instruction;Neuromuscular re-education    PT Goals (Current goals can be found in the Care Plan section)  Acute Rehab PT Goals Patient Stated Goal: to improve BP management and reduce falls risk PT Goal Formulation: With patient/family Time For Goal Achievement: 07/11/19 Potential to Achieve Goals: Fair    Frequency Min 3X/week   Barriers to discharge        Co-evaluation               AM-PAC PT "6 Clicks" Mobility  Outcome Measure Help needed turning from your back to your side while in a flat bed without using bedrails?: A Little Help needed moving from lying on your back to sitting on the side of a flat bed without using bedrails?: A Little Help needed moving to and from a bed to a chair (including a wheelchair)?: A Little Help needed standing up from a chair using your arms (e.g., wheelchair or bedside chair)?: A Little Help needed to walk in hospital room?: A Lot Help needed climbing 3-5 steps with a railing? : A Lot 6 Click Score: 16    End of Session   Activity Tolerance: Patient tolerated treatment well Patient left: in bed;with call bell/phone within reach;with family/visitor present;with bed alarm set Nurse Communication: Mobility status PT Visit Diagnosis: Muscle weakness (generalized) (M62.81)    Time: [id number] PT Time Calculation (min) (ACUTE ONLY): 34 min   Charges:   PT Evaluation $PT Eval Moderate Complexity: 1 Mod PT  Treatments $Therapeutic Exercise: 8-22 mins        Zenaida Niece, PT, DPT Acute Rehabilitation Pager: [phone]   Zenaida Niece 06/27/2019, 2:28 PM

## 2019-06-27 NOTE — Progress Notes (Signed)
Patient ate breakfast with assist of wife, brushed teeth, wife states interactive.  Patient given morning medication, noted to be less responsive, opening eyes and answering questions slowly.  BP 70/58, HR 96 on monitor, laying in bed, noted to have stool in rectal pouch.  Dr. Maylene Roes notified. Order for NS bolus 1000cc IV.  Dr. Maylene Roes and rapid repsonse RN at bedside.  Patient given 2.5mg  of Midodrine po.  BP now 104/82.  Will keep on bedrest at this time and continue to monitor BP.

## 2019-06-28 ENCOUNTER — Inpatient Hospital Stay (HOSPITAL_COMMUNITY): Payer: Medicare Other

## 2019-06-28 LAB — BASIC METABOLIC PANEL
Anion gap: 11 (ref 5–15)
BUN: 14 mg/dL (ref 8–23)
CO2: 21 mmol/L — ABNORMAL LOW (ref 22–32)
Calcium: 8.3 mg/dL — ABNORMAL LOW (ref 8.9–10.3)
Chloride: 100 mmol/L (ref 98–111)
Creatinine, Ser: 0.86 mg/dL (ref 0.61–1.24)
GFR calc Af Amer: >60 mL/min (ref >60)
GFR calc non Af Amer: >60 mL/min (ref >60)
Glucose, Bld: 121 mg/dL — ABNORMAL HIGH (ref 70–99)
Potassium: 3.6 mmol/L (ref 3.5–5.1)
Sodium: 132 mmol/L — ABNORMAL LOW (ref 135–145)

## 2019-06-28 LAB — URINALYSIS, ROUTINE W REFLEX MICROSCOPIC
Bacteria, UA: NONE SEEN
Bilirubin Urine: NEGATIVE
Glucose, UA: NEGATIVE mg/dL
Ketones, ur: NEGATIVE mg/dL
Leukocytes,Ua: NEGATIVE
Nitrite: NEGATIVE
Protein, ur: 30 mg/dL — AB
Specific Gravity, Urine: 1.025 (ref 1.005–1.030)
pH: 5 (ref 5.0–8.0)

## 2019-06-28 MED ORDER — MIDODRINE HCL 5 MG PO TABS
2.5000 mg | ORAL_TABLET | Freq: Every day | ORAL | Status: DC
  Administered 2019-06-28 – 2019-07-05 (×8): 2.5 mg via ORAL
  Filled 2019-06-28 (×8): qty 1

## 2019-06-28 NOTE — Progress Notes (Signed)
    Durable Medical Equipment  (From admission, onward)         Start     Ordered   06/28/19 1523  For home use only DME Hospital bed  Once    Question Answer Comment  Length of Need Lifetime   Patient has (list medical condition): Orthostatic hypotension, Parkinson's disease   The above medical condition requires: Patient requires the ability to reposition frequently   Bed type Semi-electric   Support Surface: Gel Overlay      06/28/19 1524

## 2019-06-28 NOTE — Progress Notes (Signed)
Pt placed on CPAP@ 9cmH2O. Pt tol well at this time.

## 2019-06-28 NOTE — Progress Notes (Signed)
PROGRESS NOTE    Joe Glover  [id number] DOB: [birth year] DOA: 06/25/2019 PCP: [name], MD     Brief Narrative:  Joe Glover is a 85 y.o. male with medical history significant of paroxysmal atrial fibrillation, sick sinus syndrome, orthostatic hypotension and recurrent syncope, chronic hyponatremia, Parkinson's disease with autonomic dysfunction, dementia, hyperlipidemia, anemia, OSA, prostate cancer, subdural hematoma, subarachnoid hemorrhage, CVA with residual dysarthria, and conditions listed below presenting to the ED for evaluation of syncope.  Systolic was 70 upon EMS arrival.  He was given fluid bolus.  Patient did not fall or sustain any injuries to his head.    He was seen earlier same day in the ED for the same problem and his systolic was in the 0000000 at that time.  Head CT showing stable previously demonstrated subdural hematoma along the posterior aspect of the interhemispheric falx and tentorium on the right. However, a new component of layering over the right frontoparietal convexity, measuring up to 8 mm in thickness.  The small foci of acute intraparenchymal hemorrhage in the left frontal lobe appeared improved.  No other new areas of acute intracranial hemorrhage.  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 ED provider had discussed the case with Dr. Trenton Gammon from neurosurgery who felt that this new finding on CT was redistribution of old blood loss and not a new bleed.  He did not feel that any neurosurgical intervention was necessary.  Hospital admission was offered at that time but patient and his wife declined.   Patient was recently admitted 12/10-12/11 for syncope resulting in a fall and head injury.  CT head revealed subdural hematoma and intraparenchymal hematoma.  Patient is now admitted for recurrent orthostatic hypotension and syncope.   12/19: Notified by RN that patient had a witnessed syncopal episode while on bedside  commode. Did not suffer injury. SBP 60 during episode.  12/20: Rapid response called due to resting hypotension with SBP 70s, less responsive on exam. Given IVF bolus and midodrine with improvement in SBP.   New events last 24 hours / Subjective: Patient sleeping with CPAP.  Wife at bedside.  No acute events.  He was able to work in bed with PT yesterday, ate well.  Blood pressure has not dropped since yesterday morning's episode.  Assessment & Plan:   Principal Problem:   Syncope Active Problems:   OSA (obstructive sleep apnea)   Hyponatremia   PAF (paroxysmal atrial fibrillation) (HCC)   Subdural hematoma (HCC)   Recurrent syncope with orthostatic hypotension, thought to be secondary to severe autonomic dysfunction from Parkinson's disease -Follows with Dr. [name], cardiology, as an outpatient -Continue midodrine, once daily  Fever -Unclear source of fever.  Check blood culture, chest x-ray, UA  History of subdural hematoma and intraparenchymal hemorrhage -Patient recently admitted 12/10 for syncope resulting in fall and head injury.  Head CT revealed subdural hematoma and intraparenchymal hematoma.  Repeat Head CT showing stable previously demonstrated subdural hematoma along the posterior aspect of the interhemispheric falx and tentorium on the right. However, a new component of layering over the right frontoparietal convexity, measuring up to 8 mm in thickness.  The small foci of acute intraparenchymal hemorrhage in the left frontal lobe appeared improved.  No other new areas of acute intracranial hemorrhage.  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 ED provider had discussed the case with Dr. Trenton Gammon from neurosurgery who felt that this new finding on CT was redistribution of old blood loss  and not a new bleed.  He did not feel that any neurosurgical intervention was necessary -Hold off on any antiplatelet/anticoagulants   Paroxysmal atrial  fibrillation -Currently in sinus rhythm.  Not on any rate controlling medications.  Not a candidate for anticoagulation given advanced age, history of falls, GI bleed, and subdural hematoma/intraparenchymal hemorrhage  Chronic hyponatremia -Has chronic hyponatremia.  Sodium ranging between 127-130 during recent hospitalization -Continue home sodium chloride tablets -Remains stable  Physical deconditioning -PT evaluation recommending home health PT  OSA -Continue CPAP  Parkinson's disease with dementia  -Has some mild cognitive impairment    DVT prophylaxis: SCD Code Status: DNR Family Communication: Wife at bedside Disposition Plan: Continue to monitor orthostatic vital sign.  Increase mobility with PT.  Fever work-up.   Consultants:   None  Procedures:   None   Antimicrobials:  Anti-infectives (From admission, onward)   None       Objective: Vitals:   06/27/19 1959 06/28/19 0039 06/28/19 0654 06/28/19 0839  BP: (!) 142/92 127/84 120/88 120/83  Pulse: (!) 109 (!) 105 92 (!) 103  Resp: 18 18 18 19   Temp: 98.5 F (36.9 C)  (!) 100.4 F (38 C) 99.8 F (37.7 C)  TempSrc: Oral Axillary Axillary Axillary  SpO2: 97%  97% 97%  Weight:   70.6 kg   Height:        Intake/Output Summary (Last 24 hours) at 06/28/2019 1025 Last data filed at 06/27/2019 1800 Gross per 24 hour  Intake 480 ml  Output 150 ml  Net 330 ml   Filed Weights   06/26/19 0017 06/27/19 0447 06/28/19 0654  Weight: 66.1 kg 67.8 kg 70.6 kg    Examination: General exam: Appears calm and comfortable  Respiratory system: Clear to auscultation. Respiratory effort normal. Cardiovascular system: S1 & S2 heard, RRR. No pedal edema. Gastrointestinal system: Abdomen is nondistended, soft and nontender.  Extremities: Symmetric in appearance bilaterally  Skin: No rashes, lesions or ulcers on exposed skin     Data Reviewed: I have personally reviewed following labs and imaging  studies  CBC: Recent Labs  Lab 06/25/19 1308 06/25/19 2046  WBC 4.3 5.8  NEUTROABS 2.4 4.1  HGB 13.3 13.7  HCT 39.7 41.8  MCV 83.9 85.3  PLT 191 [id number]   Basic Metabolic Panel: Recent Labs  Lab 06/25/19 1308 06/25/19 2046 06/26/19 0459 06/27/19 0351 06/28/19 0413  NA 128* 126* 131* 130* 132*  K 4.8 4.6 4.2 3.5 3.6  CL 94* 94* 97* 97* 100  CO2 25 22 22  21* 21*  GLUCOSE 103* 98 77 95 121*  BUN 10 10 8 13 14   CREATININE 0.87 0.89 0.78 0.90 0.86  CALCIUM 8.7* 8.7* 8.5* 8.6* 8.3*   GFR: Estimated Creatinine Clearance: 54.7 mL/min (by C-G formula based on SCr of 0.86 mg/dL). Liver Function Tests: Recent Labs  Lab 06/25/19 1308  AST 17  ALT 13  ALKPHOS 61  BILITOT 0.8  PROT 6.8  ALBUMIN 3.3*   No results for input(s): LIPASE, AMYLASE in the last 168 hours. No results for input(s): AMMONIA in the last 168 hours. Coagulation Profile: No results for input(s): INR, PROTIME in the last 168 hours. Cardiac Enzymes: No results for input(s): CKTOTAL, CKMB, CKMBINDEX, TROPONINI in the last 168 hours. BNP (last 3 results) No results for input(s): PROBNP in the last 8760 hours. HbA1C: No results for input(s): HGBA1C in the last 72 hours. CBG: Recent Labs  Lab 06/25/19 2002  GLUCAP 73   Lipid Profile: No  results for input(s): CHOL, HDL, LDLCALC, TRIG, CHOLHDL, LDLDIRECT in the last 72 hours. Thyroid Function Tests: No results for input(s): TSH, T4TOTAL, FREET4, T3FREE, THYROIDAB in the last 72 hours. Anemia Panel: No results for input(s): VITAMINB12, FOLATE, FERRITIN, TIBC, IRON, RETICCTPCT in the last 72 hours. Sepsis Labs: No results for input(s): PROCALCITON, LATICACIDVEN in the last 168 hours.  Recent Results (from the past 240 hour(s))  SARS CORONAVIRUS 2 (TAT 6-24 HRS) Nasopharyngeal Nasopharyngeal Swab     Status: None   Collection Time: 06/25/19  8:46 PM   Specimen: Nasopharyngeal Swab  Result Value Ref Range Status   SARS Coronavirus 2 NEGATIVE NEGATIVE  Final    Comment: (NOTE) SARS-CoV-2 target nucleic acids are NOT DETECTED. The SARS-CoV-2 RNA is generally detectable in upper and lower respiratory specimens during the acute phase of infection. Negative results do not preclude SARS-CoV-2 infection, do not rule out co-infections with other pathogens, and should not be used as the sole basis for treatment or other patient management decisions. Negative results must be combined with clinical observations, patient history, and epidemiological information. The expected result is Negative. Fact Sheet for Patients: SugarRoll.be Fact Sheet for Healthcare Providers: https://www.woods-mathews.com/ This test is not yet approved or cleared by the [place] FDA and  has been authorized for detection and/or diagnosis of SARS-CoV-2 by FDA under an Emergency Use Authorization (EUA). This EUA will remain  in effect (meaning this test can be used) for the duration of the COVID-19 declaration under Section 56 4(b)(1) of the Act, 21 U.S.C. section 360bbb-3(b)(1), unless the authorization is terminated or revoked sooner. Performed at [facility], [place] [address] [place], [place] [address]       Radiology Studies: No results found.    Scheduled Meds: . ferrous sulfate  325 mg Oral TID WC  . loratadine  10 mg Oral QHS  . Melatonin  4.5 mg Oral QHS  . midodrine  2.5 mg Oral Daily  . pantoprazole  40 mg Oral Daily  . pravastatin  40 mg Oral QHS  . psyllium  1 packet Oral BID  . sodium chloride flush  3 mL Intravenous Q12H  . sodium chloride  1 g Oral Q breakfast   Continuous Infusions:   LOS: 2 days      Time spent: 25 minutes   Dessa Phi, DO [facility] 06/28/2019, 10:25 AM   Available via Epic secure chat 7am-7pm After these hours, please refer to coverage provider listed on [email or web address]

## 2019-06-28 NOTE — TOC Initial Note (Signed)
Transition of Care [facility]) - Initial/Assessment Note    Patient Details  Name: Joe Glover MRN: [id number] Date of Birth: 1925/04/19  [facility] CM/SW Contact:    Bethena Roys, RN Phone Number: 06/28/2019, 2:42 PM  Clinical Narrative: Patient presented for syncope. Prior to arrival from home with the support of his wife. Patient was previously active with [facility] for home health Physical Therapy. After speaking with the patient's wife, patient will benefit from a home health adie and registered nurse once stable. Case Manager did call [facility] and she is aware that patient will benefit from RN and Aide- able to service the patient. Start of care to begin within 24-48 hours post transition home. Patient has durable medical equipment rolling walker and bedside commode in the home. Wife states that the patient's bedroom is upstairs and will need to stay downstairs. Wife wants to use [facility] for hospital bed- referral was given to [employer] Liaison- and message was sent to physician for home health and durable medical equipment orders. Case Manager did discuss with wife regarding personal care services- patient has a long-term Set designer and she states she has thought about additional care in the home. Wife was appreciative of time and information. Case Manager will continue to follow for additional transition of care needs.                Expected Discharge Plan: [place] Barriers to Discharge: Continued Medical Work up(+ orthostatics IV fluids.)   Patient Goals and CMS Choice Patient states their goals for this hospitalization and ongoing recovery are:: "to return home" CMS Medicare.gov Compare Post Acute Care list provided to:: (active with [facility]) Choice offered to / list presented to : NA  Expected Discharge Plan and Services Expected Discharge Plan: [place] In-house Referral: NA Discharge Planning  Services: CM Consult Post Acute Care Choice: Home Health, Resumption of Svcs/PTA Provider Living arrangements for the past 2 months: [place]                 DME Arranged: Hospital bed DME Agency: [facility] Date DME Agency Contacted: 06/28/19 Time DME Agency Contacted: 1440 Representative spoke with at DME Agency: Thedore Mins HH Arranged: PT, RN, Disease Management, Nurse's Aide [place] Agency: [facility] (formerly [employer]) Date [place]: 06/28/19 Time [place]: 1440 Representative spoke with at [place]: [place]  Prior Living Arrangements/Services Living arrangements for the past 2 months: [place] Lives with:: Spouse Patient language and need for interpreter reviewed:: Yes Do you feel safe going back to the place where you live?: Yes      Need for Family Participation in Patient Care: Yes (Comment) Care giver support system in place?: Yes (comment) Current home services: DME, Home PT(Pt has DME- RW and 3n1. [facility] with [facility]) Criminal Activity/Legal Involvement Pertinent to Current Situation/Hospitalization: No - Comment as needed  Activities of Daily Living Home Assistive Devices/Equipment: Wheelchair, Radio producer (specify quad or straight) ADL Screening (condition at time of admission) Patient's cognitive ability adequate to safely complete daily activities?: No Is the patient deaf or have difficulty hearing?: Yes Does the patient have difficulty seeing, even when wearing glasses/contacts?: No Does the patient have difficulty concentrating, remembering, or making decisions?: Yes Patient able to express need for assistance with ADLs?: No Does the patient have difficulty dressing or bathing?: Yes Independently performs ADLs?: No Communication: Independent Dressing (OT): Needs assistance Is  this a change from baseline?: Pre-admission baseline Grooming: Dependent Is this a change from baseline?: Pre-admission baseline Feeding: Needs  assistance Is this a change from baseline?: Pre-admission baseline Bathing: Needs assistance Is this a change from baseline?: Pre-admission baseline Toileting: Dependent Is this a change from baseline?: Pre-admission baseline In/Out Bed: Dependent Is this a change from baseline?: Pre-admission baseline Walks in Home: Needs assistance Is this a change from baseline?: Pre-admission baseline Does the patient have difficulty walking or climbing stairs?: Yes Weakness of Legs: Both Weakness of Arms/Hands: Both  Permission Sought/Granted Permission sought to share information with : Family Supports, Chartered certified accountant granted to share information with : Yes, Verbal Permission Granted     Permission granted to share info w AGENCY: [facility]        Emotional Assessment Appearance:: Appears stated age Attitude/Demeanor/Rapport: Unable to Assess(asleep and spoke with wife.) Affect (typically observed): Unable to Assess(asleep and spoke with wife.) Orientation: : Oriented to Self, Oriented to Place Alcohol / Substance Use: Not Applicable Psych Involvement: No (comment)  Admission diagnosis:  Syncope [R55] Patient Active Problem List   Diagnosis Date Noted  . Intraparenchymal hematoma of brain due to trauma ([place]) 06/17/2019  . Closed right hip fracture ([place]) 11/09/2018  . Sick sinus syndrome ([place]) 09/16/2018  . Bradycardia 05/14/2018  . Fall from standing 02/25/2017  . Parkinson disease ([place]) 01/27/2017  . Hyponatremia 01/27/2017  . Hypokalemia 01/27/2017  . Cellulitis of right hand 01/27/2017  . Normocytic anemia 01/27/2017  . Fall 09/16/2016  . SDH (subdural hematoma) ([place]) 09/16/2016  . Subdural hematoma ([place]) 09/05/2016  . Insomnia 08/13/2016  . On amiodarone therapy 08/06/2015  . Orthostatic hypotension 08/06/2015  . PAF (paroxysmal atrial fibrillation) ([place]) 08/06/2015  . Status post placement of implantable loop recorder 07/11/2015  .  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 Syncope 11/02/2014  . Awareness alteration, transient 11/02/2014  . OSA (obstructive sleep apnea) 05/31/2014  . Essential hypertension 09/23/2012  . Prostate cancer ([place]) 09/23/2012   PCP:  [name], MD Pharmacy:   [facility] [phone] - [place], [place] AT [place] [id number] [place] [address] Phone: [phone] Fax: [phone]  [id number] - [place], [address] AT [place] [id number] [place] [address] Phone: [phone] Fax: [place], [place] - [place] [phone] [place] [address] Phone: [phone] Fax: [phone]     Social Determinants of Health (SDOH) Interventions    Readmission Risk Interventions No flowsheet data found.

## 2019-06-28 NOTE — ED Provider Notes (Signed)
Joe Glover 6E PROGRESSIVE CARE Provider Note   CSN: [id number] Arrival date & time: 06/25/19  1947     History No chief complaint on file.   Joe Glover is a 85 y.o. male.  HPI Joe Glover is a 85 y.o. male with medical history of Parkinson's, hypertension, GI bleed, stroke who presents to the ED for syncopal episode.  He was seen at this ED earlier today for same and was found to have subdural hemorrhage, was cleared by neurosurgery and was appropriate for discharge home after a shared decision making discussion between family and providers.  He returned home this afternoon and had a similar event in which she was sitting in his recliner, passed out and was difficult to arouse, wife recorded his blood pressure at 60/40 and brought him to the hospital.  He has no traumatic injury, did not fall.  He is now at his mental baseline and his blood pressure has returned to 140/90 with no intervention.  His wife denies any changes in medications, recent illness or trauma.      Past Medical History:  Diagnosis Date  . Awareness alteration, transient 11/02/2014  . Cellulitis of right hand 01/27/2017  . Colon cancer ([place])   . Essential hypertension 09/23/2012  . Fall 09/16/2016  . Fall from standing 02/25/2017  . GI bleed   . Hyperlipidemia   . Hypertension   . Insomnia 08/13/2016  . Normocytic anemia 01/27/2017  . On amiodarone therapy 08/06/2015  . Orthostatic hypotension 08/06/2015  . OSA (obstructive sleep apnea)   . PAF (paroxysmal atrial fibrillation) ([place])   . Parkinson disease ([place])   . Prostate cancer ([place])   . SDH (subdural hematoma) ([place]) 09/16/2016  . Status post placement of implantable loop recorder 07/11/2015  . Stroke ([place])   . 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 Subdural hematoma ([place]) 09/05/2016  . Syncope 11/02/2014  . TIA (transient ischemic attack)   . Vitamin D deficiency       Patient Active Problem List   Diagnosis Date Noted  . Intraparenchymal hematoma of brain due to trauma ([place]) 06/17/2019  . Closed right hip fracture ([place]) 11/09/2018  . Sick sinus syndrome ([place]) 09/16/2018  . Bradycardia 05/14/2018  . Fall from standing 02/25/2017  . Parkinson disease ([place]) 01/27/2017  . Hyponatremia 01/27/2017  . Hypokalemia 01/27/2017  . Cellulitis of right hand 01/27/2017  . Normocytic anemia 01/27/2017  . Fall 09/16/2016  . SDH (subdural hematoma) ([place]) 09/16/2016  . Subdural hematoma ([place]) 09/05/2016  . Insomnia 08/13/2016  . On amiodarone therapy 08/06/2015  . Orthostatic hypotension 08/06/2015  . PAF (paroxysmal atrial fibrillation) ([place]) 08/06/2015  . Status post placement of implantable loop recorder 07/11/2015  . 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 Syncope 11/02/2014  . Awareness alteration, transient 11/02/2014  . OSA (obstructive sleep apnea) 05/31/2014  . Essential hypertension 09/23/2012  . Prostate cancer ([place]) 09/23/2012    Past Surgical History:  Procedure Laterality Date  . COLECTOMY    . HERNIA REPAIR    . ORCHIECTOMY    . PROSTATECTOMY    . TOTAL HIP ARTHROPLASTY Right 11/11/2018   Procedure: TOTAL HIP ARTHROPLASTY ANTERIOR APPROACH;  Surgeon: Gaynelle Arabian, MD;  Location: [facility];  Service: Orthopedics;  Laterality: Right;  100       Family History  Problem Relation Age of Onset  . Parkinson's disease  Mother   . Heart attack Father   . Heart attack Brother     Social History   Tobacco Use  . Smoking status: Former Smoker    Packs/Lorella Gomez: 2.00    Years: 20.00    Pack years: 40.00    Types: Cigarettes    Quit date: 07/08/1958    Years since quitting: 61.0  . Smokeless tobacco: Never Used  Substance Use Topics  . Alcohol use: Yes    Alcohol/week: 0.0 standard drinks    Comment: occasional - 1-2 drinks a month  . Drug use: No    Home Medications Prior to Admission  medications   Medication Sig Start Date End Date Taking? Authorizing Provider  acetaminophen (TYLENOL) 500 MG tablet Take 500 mg by mouth at bedtime.   Yes [provider]  Carboxymethylcell-Hypromellose (GENTEAL) 0.25-0.3 % GEL Place 1 drop into both eyes 2 (two) times daily.   Yes [provider]  Cholecalciferol (VITAMIN D3) 50 MCG (2000 UT) TABS Take 1,000-2,000 Units by mouth See admin instructions. Take one tablet (2000 units) by mouth with breakfast and supper, take 1/2 tablet (1000 units) with lunch   Yes [provider]  cloNIDine (CATAPRES) 0.1 MG tablet Take 1 tablet daily as needed for systolic (top number) blood pressure greater than 180 Patient taking differently: Take 0.1 mg by mouth See admin instructions. Take 1 tablet (0.1 mg) daily as needed for systolic (top number) blood pressure greater than 180 02/19/19  Yes Munley, Hilton Cork, MD  cyanocobalamin (,VITAMIN B-12,) 1000 MCG/ML injection Inject 1,000 mcg into the muscle every 30 (thirty) days.    Yes [provider]  EPINEPHrine 0.3 mg/0.3 mL IJ SOAJ injection Inject 0.3 mg into the muscle daily as needed for anaphylaxis (allergic reaction).    Yes [provider]  ferrous sulfate 325 (65 FE) MG tablet Take 325 mg by mouth 3 (three) times daily with meals.    Yes [provider]  ipratropium (ATROVENT) 0.03 % nasal spray Place 1 spray into the nose 2 (two) times daily. 08/20/17  Yes [provider]  loratadine (CLARITIN) 10 MG tablet Take 10 mg by mouth at bedtime.    Yes [provider]  magnesium oxide (MAG-OX) 400 MG tablet Take 800 mg by mouth 2 (two) times daily.    Yes [provider]  Melatonin 5 MG TABS Take 5 mg by mouth at bedtime.    Yes [provider]  midodrine (PROAMATINE) 2.5 MG tablet Take 1 tablet daily in the morning if two sequential systolic BP readings (top number) within 15 minutes are less than 100. Patient taking  differently: Take 2.5 mg by mouth See admin instructions. Take 1 tablet (2.5 mg) daily in the morning if two sequential systolic BP readings (top number) within 15 minutes are less than 100. 03/09/19  Yes Munley, Hilton Cork, MD  omeprazole (PRILOSEC) 20 MG capsule Take 20 mg by mouth daily before breakfast.  04/26/14  Yes [provider]  OnabotulinumtoxinA (BOTOX IJ) Inject as directed every 3 (three) months. Next injection due end of December 2020 (used to stop drooling)   Yes [provider]  potassium chloride SA (K-DUR,KLOR-CON) 20 MEQ tablet Take 20 mEq by mouth daily.  08/04/18  Yes [provider]  pravastatin (PRAVACHOL) 40 MG tablet Take 40 mg by mouth at bedtime.  05/07/14  Yes [provider]  [place] into the lungs at bedtime. CPAP   Yes [provider]  Probiotic Product (PROBIOTIC DAILY PO) Take 1 tablet by mouth at bedtime.    Yes [provider]  psyllium (METAMUCIL) 58.6 % packet Take 1 packet by mouth 2 (two) times daily. Mix in water and drink   Yes [provider]  sodium chloride 1 g tablet Take 1 tablet (1 g total) by mouth daily. 06/18/18  Yes Richardo Priest, MD  temazepam (RESTORIL) 7.5 MG capsule Take 1 capsule (7.5 mg total) by mouth at bedtime as needed for sleep. Patient taking differently: Take 7.5 mg by mouth at bedtime.  07/26/18  Yes Quintella Reichert, MD    Allergies    Aspirin, Augmentin [amoxicillin-pot clavulanate], Hemocyte plus [hematinic plus vit-minerals], and Penicillins  Review of Systems   Review of Systems  Unable to perform ROS: Dementia  Respiratory: Negative for shortness of breath.   Cardiovascular: Negative for chest pain.  Gastrointestinal: Negative for abdominal pain.  Neurological: Positive for syncope.  Psychiatric/Behavioral: Positive for confusion.    Physical Exam Updated Vital Signs BP 127/84 (BP Location: Right Arm)   Pulse (!) 105   Temp 98.5 F (36.9  C) (Oral)   Resp 18   Ht 6' (1.829 m)   Wt 67.8 kg   SpO2 97%   BMI 20.27 kg/m   Physical Exam Vitals and nursing note reviewed.  Constitutional:      General: He is not in acute distress.    Appearance: Normal appearance. He is well-developed. He is not ill-appearing.     Comments: Elderly male who appears stated age, pleasantly confused, mumbles to respond but is disoriented  HENT:     Head: Normocephalic and atraumatic.     Right Ear: External ear normal.     Left Ear: External ear normal.     Nose: Nose normal. No congestion.     Mouth/Throat:     Mouth: Mucous membranes are moist.  Eyes:     General:        Right eye: No discharge.        Left eye: No discharge.     Conjunctiva/sclera: Conjunctivae normal.  Cardiovascular:     Rate and Rhythm: Normal rate and regular rhythm.     Pulses: Normal pulses.     Heart sounds: Normal heart sounds. No murmur.  Pulmonary:     Effort: Pulmonary effort is normal. No respiratory distress.     Breath sounds: Normal breath sounds. No wheezing or rales.  Abdominal:     General: Abdomen is flat. There is no distension.     Palpations: Abdomen is soft.     Tenderness: There is no abdominal tenderness.  Musculoskeletal:        General: No signs of injury. Normal range of motion.     Cervical back: Normal range of motion and neck supple.  Skin:    General: Skin is warm and dry.     Capillary Refill: Capillary refill takes less than 2 seconds.  Neurological:     General: No focal deficit present.     Mental Status: He is alert. Mental status is at baseline.  Psychiatric:        Mood and Affect: Mood normal.        Behavior: Behavior normal.     ED Results / Procedures / Treatments   Labs (all labs ordered are listed, but only abnormal results are displayed) Labs Reviewed  BASIC METABOLIC PANEL - Abnormal; Notable for the following components:      Result Value  Sodium 126 (*)    Chloride 94 (*)    Calcium 8.7 (*)    All  other components within normal limits  BASIC METABOLIC PANEL - Abnormal; Notable for the following components:   Sodium 131 (*)    Chloride 97 (*)    Calcium 8.5 (*)    All other components within normal limits  BASIC METABOLIC PANEL - Abnormal; Notable for the following components:   Sodium 130 (*)    Chloride 97 (*)    CO2 21 (*)    Calcium 8.6 (*)    All other components within normal limits  SARS CORONAVIRUS 2 (TAT 6-24 HRS)  CBC WITH DIFFERENTIAL/PLATELET  BASIC METABOLIC PANEL  CBG MONITORING, ED  TROPONIN I (HIGH SENSITIVITY)  TROPONIN I (HIGH SENSITIVITY)    EKG EKG Interpretation  Date/Time:  Friday June 25 2019 20:26:01 EST Ventricular Rate:  90 PR Interval:    QRS Duration: 138 QT Interval:  396 QTC Calculation: 485 R Axis:   -80 Text Interpretation: Sinus rhythm Ventricular premature complex Nonspecific IVCD with LAD Artifact in lead(s) I II III aVR aVL aVF V1 V2 V3 V4 V5 V6 No significant change since last tracing Confirmed by Wandra Arthurs [phone]) on 06/25/2019 8:34:56 PM   Radiology No results found.  Procedures Procedures (including critical care time)  Medications Ordered in ED Medications  sodium chloride tablet 1 g (1 g Oral Given 06/27/19 0935)  sodium chloride flush (NS) 0.9 % injection 3 mL (3 mLs Intravenous Given 06/27/19 2110)  acetaminophen (TYLENOL) tablet 650 mg (650 mg Oral Given 06/27/19 1558)    Or  acetaminophen (TYLENOL) suppository 650 mg ( Rectal See Alternative 06/27/19 1558)  pravastatin (PRAVACHOL) tablet 40 mg (40 mg Oral Given 06/27/19 2110)  temazepam (RESTORIL) capsule 7.5 mg (7.5 mg Oral Given 06/27/19 2110)  Melatonin TABS 4.5 mg (4.5 mg Oral Given 06/27/19 2109)  midodrine (PROAMATINE) tablet 2.5 mg (2.5 mg Oral Given 06/27/19 1007)  ferrous sulfate tablet 325 mg (325 mg Oral Given 06/27/19 1558)  loratadine (CLARITIN) tablet 10 mg (10 mg Oral Given 06/27/19 2109)  pantoprazole (PROTONIX) EC tablet 40 mg (40 mg Oral  Given 06/27/19 0931)  psyllium (HYDROCIL/METAMUCIL) packet 1 packet (1 packet Oral Given 06/27/19 2110)  sodium chloride 0.9 % bolus 500 mL (0 mLs Intravenous Stopped 06/25/19 2155)  acetaminophen (TYLENOL) tablet 650 mg (650 mg Oral Given 06/25/19 2158)  sodium chloride 0.9 % bolus 1,000 mL (0 mLs Intravenous Stopped 06/27/19 1106)    ED Course  I have reviewed the triage vital signs and the nursing notes.  Pertinent labs & imaging results that were available during my care of the patient were reviewed by me and considered in my medical decision making (see chart for details).    MDM Rules/Calculators/A&P                      Joe Glover is a 85 y.o. male with medical history of Parkinson's, hypertension, GI bleed, stroke who presents to the ED for syncopal episode.  He was seen at this ED earlier today for same and was found to have subdural hemorrhage, was cleared by neurosurgery and was appropriate for discharge home after a shared decision making discussion between family and providers.  He returned home this afternoon and had a similar event in which she was sitting in his recliner, passed out and was difficult to arouse, wife recorded his blood pressure at 60/40 and brought him to the  hospital.  He has no traumatic injury, did not fall.  He is now at his mental baseline and his blood pressure has returned to 140/90 with no intervention.  His wife denies any changes in medications, recent illness or trauma.  HPI and physical exam as above.  Elderly male who presents appears stated age, pleasantly confused, mumbles to respond but is disoriented, hemodynamically stable, afebrile, non toxic.  No signs of trauma.  ABC intact.  Laboratory work significant for hyponatremia to 126.  He does have chronic hyponatremia.  No findings of anemia.  His troponin is 9, his ECG is nonischemic, normal sinus rhythm.  He moves all extremities with equal strength, face is symmetric.  He had a CT earlier today  which demonstrates a subdural hemorrhage.  Low suspicion for CVA.  His syncope could be a component of cardiogenic syncope or autonomic dysfunction.  Will admit for further observation and management.  I consulted medicine for admission, they have evaluated the patient and agree with plan for admission. I discussed this plan with the patient and family, they understand and agree with plan for admission.      Final Clinical Impression(s) / ED Diagnoses Final diagnoses:  Syncope, unspecified syncope type    Rx / DC Orders ED Discharge Orders    None       Tytus Strahle, Lovena Le, MD 06/28/19 0152    Drenda Freeze, MD 06/29/19 [phone]

## 2019-06-28 NOTE — Progress Notes (Signed)
RN placed Patient on CPAP and he is tolerating well at this time.

## 2019-06-29 LAB — BASIC METABOLIC PANEL
Anion gap: 10 (ref 5–15)
BUN: 17 mg/dL (ref 8–23)
CO2: 20 mmol/L — ABNORMAL LOW (ref 22–32)
Calcium: 8.3 mg/dL — ABNORMAL LOW (ref 8.9–10.3)
Chloride: 99 mmol/L (ref 98–111)
Creatinine, Ser: 0.93 mg/dL (ref 0.61–1.24)
GFR calc Af Amer: >60 mL/min (ref >60)
GFR calc non Af Amer: >60 mL/min (ref >60)
Glucose, Bld: 112 mg/dL — ABNORMAL HIGH (ref 70–99)
Potassium: 3.1 mmol/L — ABNORMAL LOW (ref 3.5–5.1)
Sodium: 129 mmol/L — ABNORMAL LOW (ref 135–145)

## 2019-06-29 LAB — RESPIRATORY PANEL BY PCR

## 2019-06-29 LAB — CBC
HCT: 34.9 % — ABNORMAL LOW (ref 39.0–52.0)
Hemoglobin: 12 g/dL — ABNORMAL LOW (ref 13.0–17.0)
MCH: 27.9 pg (ref 26.0–34.0)
MCHC: 34.4 g/dL (ref 30.0–36.0)
MCV: 81.2 fL (ref 80.0–100.0)
Platelets: 166 10*3/uL (ref 150–400)
RBC: 4.3 MIL/uL (ref 4.22–5.81)
RDW: 14.6 % (ref 11.5–15.5)
WBC: 7.1 10*3/uL (ref 4.0–10.5)
nRBC: 0 % (ref 0.0–0.2)

## 2019-06-29 LAB — GLUCOSE, CAPILLARY: Glucose-Capillary: 103 mg/dL — ABNORMAL HIGH (ref 70–99)

## 2019-06-29 MED ORDER — CIPROFLOXACIN HCL 500 MG PO TABS
500.0000 mg | ORAL_TABLET | Freq: Two times a day (BID) | ORAL | Status: DC
  Administered 2019-06-29 – 2019-06-30 (×3): 500 mg via ORAL
  Filled 2019-06-29 (×3): qty 1

## 2019-06-29 MED ORDER — CIPROFLOXACIN HCL 500 MG PO TABS: 500.0000 mg | ORAL_TABLET | Freq: Two times a day (BID) | ORAL | Status: DC

## 2019-06-29 MED ORDER — POTASSIUM CHLORIDE CRYS ER 20 MEQ PO TBCR: 40.0000 meq | EXTENDED_RELEASE_TABLET | Freq: Once | ORAL | Status: DC

## 2019-06-29 MED ORDER — POTASSIUM CHLORIDE CRYS ER 20 MEQ PO TBCR
40.0000 meq | EXTENDED_RELEASE_TABLET | Freq: Two times a day (BID) | ORAL | Status: AC
  Administered 2019-06-29 (×2): 40 meq via ORAL
  Filled 2019-06-29 (×2): qty 2

## 2019-06-29 NOTE — Progress Notes (Signed)
Physical Therapy Treatment Patient Details Name: Joe Glover MRN: [id number] DOB: [birth year] Today's Date: 06/29/2019    History of Present Illness 85 y.o. male with medical history significant of paroxysmal atrial fibrillation, sick sinus syndrome, orthostatic hypotension and recurrent syncope, chronic hyponatremia, Parkinson's disease with autonomic dysfunction, dementia, hyperlipidemia, anemia, OSA, prostate cancer, subdural hematoma, subarachnoid hemorrhage, CVA with residual dysarthria, and conditions listed below presenting to the ED for evaluation of syncope.    PT Comments    Patient received in bed, agrees to PT session. Wife present. Patient requires min assist for supine to sit with cues. Able to sit without external assist x several minutes and performed seated LE exercises. Sit to stand with min assist and rolling walker. Patient stood for a couple of minutes then requested to sit back down. Patient fatigued at end of session, falling asleep prior to me leaving room. He will continue to benefit from skilled PT while here to improve strength and safety with mobility.      Follow Up Recommendations  Home health PT;Supervision/Assistance - 24 hour     Equipment Recommendations  None recommended by PT    Recommendations for Other Services       Precautions / Restrictions Precautions Precautions: Fall Precaution Comments: h/o syncope Restrictions Weight Bearing Restrictions: No    Mobility  Bed Mobility Overal bed mobility: Needs Assistance Bed Mobility: Supine to Sit;Sit to Supine     Supine to sit: Min assist Sit to supine: Supervision   General bed mobility comments: patient requires min assist to raise trunk to sitting. assist needed for him to scoot in supine  Transfers Overall transfer level: Needs assistance Equipment used: Rolling walker (2 wheeled) Transfers: Sit to/from Stand Sit to Stand: Min assist         General transfer comment: increased  time and needed some steadying A as he came up and upon first standing. Stood for a couple of minutes and then asked to sit back down due to dizziness.  Ambulation/Gait             General Gait Details: deferred   Stairs             Wheelchair Mobility    Modified Rankin (Stroke Patients Only)       Balance Overall balance assessment: Needs assistance Sitting-balance support: Feet supported Sitting balance-Leahy Scale: Good     Standing balance support: Bilateral upper extremity supported;During functional activity Standing balance-Leahy Scale: Fair Standing balance comment: reliant on RW and additonal external A                            Cognition Arousal/Alertness: Awake/alert Behavior During Therapy: WFL for tasks assessed/performed Overall Cognitive Status: Within Functional Limits for tasks assessed                                        Exercises Other Exercises Other Exercises: seated BLE: LAQ, marching x 10 reps each    General Comments        Pertinent Vitals/Pain Pain Assessment: No/denies pain    Home Living                      Prior Function            PT Goals (current goals can now be found in the care plan section)  Acute Rehab PT Goals Patient Stated Goal: to improve BP management and reduce falls risk PT Goal Formulation: With patient/family Time For Goal Achievement: 07/11/19 Potential to Achieve Goals: Fair Progress towards PT goals: Progressing toward goals    Frequency    Min 3X/week      PT Plan Current plan remains appropriate    Co-evaluation              AM-PAC PT "6 Clicks" Mobility   Outcome Measure  Help needed turning from your back to your side while in a flat bed without using bedrails?: A Little Help needed moving from lying on your back to sitting on the side of a flat bed without using bedrails?: A Little Help needed moving to and from a bed to a chair  (including a wheelchair)?: A Little Help needed standing up from a chair using your arms (e.g., wheelchair or bedside chair)?: A Little Help needed to walk in hospital room?: A Little Help needed climbing 3-5 steps with a railing? : A Lot 6 Click Score: 17    End of Session Equipment Utilized During Treatment: Gait belt Activity Tolerance: Patient tolerated treatment well;Patient limited by lethargy Patient left: in bed;with bed alarm set;with family/visitor present Nurse Communication: Mobility status PT Visit Diagnosis: Muscle weakness (generalized) (M62.81);Dizziness and giddiness (R42)     Time: [id number] PT Time Calculation (min) (ACUTE ONLY): 23 min  Charges:  $Therapeutic Exercise: 8-22 mins $Therapeutic Activity: 8-22 mins                     Davidjames Blansett, PT, GCS 06/29/19,4:19 PM

## 2019-06-29 NOTE — Progress Notes (Signed)
PROGRESS NOTE    Joe Glover  [id number] DOB: [birth year] DOA: 06/25/2019 PCP: [name], MD     Brief Narrative:  Joe Glover is a 85 y.o. male with medical history significant of paroxysmal atrial fibrillation, sick sinus syndrome, orthostatic hypotension and recurrent syncope, chronic hyponatremia, Parkinson's disease with autonomic dysfunction, dementia, hyperlipidemia, anemia, OSA, prostate cancer, subdural hematoma, subarachnoid hemorrhage, CVA with residual dysarthria, and conditions listed below presenting to the ED for evaluation of syncope.  Systolic was 70 upon EMS arrival.  He was given fluid bolus.  Patient did not fall or sustain any injuries to his head.    He was seen earlier same day in the ED for the same problem and his systolic was in the 0000000 at that time.  Head CT showing stable previously demonstrated subdural hematoma along the posterior aspect of the interhemispheric falx and tentorium on the right. However, a new component of layering over the right frontoparietal convexity, measuring up to 8 mm in thickness.  The small foci of acute intraparenchymal hemorrhage in the left frontal lobe appeared improved.  No other new areas of acute intracranial hemorrhage.  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 ED provider had discussed the case with Dr. Trenton Gammon from neurosurgery who felt that this new finding on CT was redistribution of old blood loss and not a new bleed.  He did not feel that any neurosurgical intervention was necessary.  Hospital admission was offered at that time but patient and his wife declined.   Patient was recently admitted 12/10-12/11 for syncope resulting in a fall and head injury.  CT head revealed subdural hematoma and intraparenchymal hematoma.  Patient is now admitted for recurrent orthostatic hypotension and syncope.   12/19: Notified by RN that patient had a witnessed syncopal episode while on bedside  commode. Did not suffer injury. SBP 60 during episode.  12/20: Rapid response called due to resting hypotension with SBP 70s, less responsive on exam. Given IVF bolus and midodrine with improvement in SBP.   New events last 24 hours / Subjective: Orthostatic VS much better. Fever overnight 101.8. Per wife at bedside, patient has been eating well without any complaints.  Assessment & Plan:   Principal Problem:   Syncope Active Problems:   OSA (obstructive sleep apnea)   Hyponatremia   PAF (paroxysmal atrial fibrillation) (HCC)   Subdural hematoma (HCC)   Recurrent syncope with orthostatic hypotension, thought to be secondary to severe autonomic dysfunction from Parkinson's disease -Follows with Dr. [name], cardiology, as an outpatient -Orthostatic vital signs 12/21: 148/97 --> 143/103 -Continue midodrine, once daily  UTI -UA unremarkable but urine culture showing gram-negative rod -Identification and sensitivity pending.  Patient is allergic to penicillin.  Started Cipro   History of subdural hematoma and intraparenchymal hemorrhage -Patient recently admitted 12/10 for syncope resulting in fall and head injury.  Head CT revealed subdural hematoma and intraparenchymal hematoma.  Repeat Head CT showing stable previously demonstrated subdural hematoma along the posterior aspect of the interhemispheric falx and tentorium on the right. However, a new component of layering over the right frontoparietal convexity, measuring up to 8 mm in thickness.  The small foci of acute intraparenchymal hemorrhage in the left frontal lobe appeared improved.  No other new areas of acute intracranial hemorrhage.  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 ED provider had discussed the case with Dr. Trenton Gammon from neurosurgery who felt that this new finding on CT was redistribution of old  blood loss and not a new bleed.  He did not feel that any neurosurgical intervention was necessary -Hold  off on any antiplatelet/anticoagulants   Paroxysmal atrial fibrillation -Currently in sinus rhythm.  Not on any rate controlling medications.  Not a candidate for anticoagulation given advanced age, history of falls, GI bleed, and subdural hematoma/intraparenchymal hemorrhage  Chronic hyponatremia -Has chronic hyponatremia.  Sodium ranging between 127-130 during recent hospitalization -Continue home sodium chloride tablets  Physical deconditioning -PT evaluation recommending home health PT  OSA -Continue CPAP  Parkinson's disease with dementia  -Has some mild cognitive impairment    DVT prophylaxis: SCD Code Status: DNR Family Communication: Wife at bedside Disposition Plan: Continue to monitor fever curve. Patient to discharge home with home health once improved    Consultants:   None  Procedures:   None   Antimicrobials:  Anti-infectives (From admission, onward)   None       Objective: Vitals:   06/28/19 2100 06/29/19 0012 06/29/19 0112 06/29/19 0410  BP: 135/88 (!) 121/92  108/71  Pulse:  (!) 102  87  Resp:      Temp:  (!) 101.8 F (38.8 C) (!) 101.8 F (38.8 C) 98.5 F (36.9 C)  TempSrc:  Oral Oral Oral  SpO2:  98%  100%  Weight:    67.7 kg  Height:        Intake/Output Summary (Last 24 hours) at 06/29/2019 1312 Last data filed at 06/29/2019 0900 Gross per 24 hour  Intake 500 ml  Output --  Net 500 ml   Filed Weights   06/27/19 0447 06/28/19 0654 06/29/19 0410  Weight: 67.8 kg 70.6 kg 67.7 kg    Examination: General exam: Appears calm and comfortable  Respiratory system: Clear to auscultation. Respiratory effort normal. Cardiovascular system: S1 & S2 heard, RRR. No pedal edema. Gastrointestinal system: Abdomen is nondistended, soft and nontender. Normal bowel sounds heard. Extremities: Symmetric in appearance bilaterally  Skin: No rashes, lesions or ulcers on exposed skin    Data Reviewed: I have personally reviewed following labs  and imaging studies  CBC: Recent Labs  Lab 06/25/19 1308 06/25/19 2046 06/29/19 0351  WBC 4.3 5.8 7.1  NEUTROABS 2.4 4.1  --   HGB 13.3 13.7 12.0*  HCT 39.7 41.8 34.9*  MCV 83.9 85.3 81.2  PLT 191 221 [id number]   Basic Metabolic Panel: Recent Labs  Lab 06/25/19 2046 06/26/19 0459 06/27/19 0351 06/28/19 0413 06/29/19 0351  NA 126* 131* 130* 132* 129*  K 4.6 4.2 3.5 3.6 3.1*  CL 94* 97* 97* 100 99  CO2 22 22 21* 21* 20*  GLUCOSE 98 77 95 121* 112*  BUN 10 8 13 14 17   CREATININE 0.89 0.78 0.90 0.86 0.93  CALCIUM 8.7* 8.5* 8.6* 8.3* 8.3*   GFR: Estimated Creatinine Clearance: 48.5 mL/min (by C-G formula based on SCr of 0.93 mg/dL). Liver Function Tests: Recent Labs  Lab 06/25/19 1308  AST 17  ALT 13  ALKPHOS 61  BILITOT 0.8  PROT 6.8  ALBUMIN 3.3*   No results for input(s): LIPASE, AMYLASE in the last 168 hours. No results for input(s): AMMONIA in the last 168 hours. Coagulation Profile: No results for input(s): INR, PROTIME in the last 168 hours. Cardiac Enzymes: No results for input(s): CKTOTAL, CKMB, CKMBINDEX, TROPONINI in the last 168 hours. BNP (last 3 results) No results for input(s): PROBNP in the last 8760 hours. HbA1C: No results for input(s): HGBA1C in the last 72 hours. CBG: Recent Labs  Lab 06/25/19 2002 06/29/19 0412  GLUCAP 73 103*   Lipid Profile: No results for input(s): CHOL, HDL, LDLCALC, TRIG, CHOLHDL, LDLDIRECT in the last 72 hours. Thyroid Function Tests: No results for input(s): TSH, T4TOTAL, FREET4, T3FREE, THYROIDAB in the last 72 hours. Anemia Panel: No results for input(s): VITAMINB12, FOLATE, FERRITIN, TIBC, IRON, RETICCTPCT in the last 72 hours. Sepsis Labs: No results for input(s): PROCALCITON, LATICACIDVEN in the last 168 hours.  Recent Results (from the past 240 hour(s))  SARS CORONAVIRUS 2 (TAT 6-24 HRS) Nasopharyngeal Nasopharyngeal Swab     Status: None   Collection Time: 06/25/19  8:46 PM   Specimen: Nasopharyngeal  Swab  Result Value Ref Range Status   SARS Coronavirus 2 NEGATIVE NEGATIVE Final    Comment: (NOTE) SARS-CoV-2 target nucleic acids are NOT DETECTED. The SARS-CoV-2 RNA is generally detectable in upper and lower respiratory specimens during the acute phase of infection. Negative results do not preclude SARS-CoV-2 infection, do not rule out co-infections with other pathogens, and should not be used as the sole basis for treatment or other patient management decisions. Negative results must be combined with clinical observations, patient history, and epidemiological information. The expected result is Negative. Fact Sheet for Patients: SugarRoll.be Fact Sheet for Healthcare Providers: https://www.woods-mathews.com/ This test is not yet approved or cleared by the [place] FDA and  has been authorized for detection and/or diagnosis of SARS-CoV-2 by FDA under an Emergency Use Authorization (EUA). This EUA will remain  in effect (meaning this test can be used) for the duration of the COVID-19 declaration under Section 56 4(b)(1) of the Act, 21 U.S.C. section 360bbb-3(b)(1), unless the authorization is terminated or revoked sooner. Performed at [facility], [place] [address], [place], [place] [address]   Culture, blood (routine x 2)     Status: None (Preliminary result)   Collection Time: 06/28/19  7:50 AM   Specimen: BLOOD LEFT HAND  Result Value Ref Range Status   Specimen Description BLOOD LEFT HAND  Final   Special Requests   Final    BOTTLES DRAWN AEROBIC ONLY Blood Culture results may not be optimal due to an inadequate volume of blood received in culture bottles   Culture   Final    NO GROWTH < 24 HOURS Performed at [facility], [place] [address], [place], [place] [address]    Report Status PENDING  Incomplete  Culture, blood (routine x 2)     Status: None (Preliminary result)   Collection Time: 06/28/19  7:55 AM    Specimen: BLOOD LEFT HAND  Result Value Ref Range Status   Specimen Description BLOOD LEFT HAND  Final   Special Requests   Final    BOTTLES DRAWN AEROBIC ONLY Blood Culture adequate volume   Culture   Final    NO GROWTH < 24 HOURS Performed at [facility], [place] [address], [place], [place] [address]    Report Status PENDING  Incomplete  Culture, Urine     Status: Abnormal (Preliminary result)   Collection Time: 06/28/19  3:15 PM   Specimen: Urine, Random  Result Value Ref Range Status   Specimen Description URINE, RANDOM  Final   Special Requests   Final    NONE Performed at [facility], [place] [address] [place], [place] [address]    Culture 80,000 COLONIES/mL GRAM NEGATIVE RODS (A)  Final   Report Status PENDING  Incomplete  Respiratory Panel by PCR     Status:  None   Collection Time: 06/29/19 10:27 AM   Specimen: Nasopharyngeal Swab; Respiratory  Result Value Ref Range Status   Adenovirus NOT DETECTED NOT DETECTED Final   Coronavirus 229E NOT DETECTED NOT DETECTED Final    Comment: (NOTE) The Coronavirus on the Respiratory Panel, DOES NOT test for the novel  Coronavirus (2019 nCoV)    Coronavirus HKU1 NOT DETECTED NOT DETECTED Final   Coronavirus NL63 NOT DETECTED NOT DETECTED Final   Coronavirus OC43 NOT DETECTED NOT DETECTED Final   Metapneumovirus NOT DETECTED NOT DETECTED Final   Rhinovirus / Enterovirus NOT DETECTED NOT DETECTED Final   Influenza A NOT DETECTED NOT DETECTED Final   Influenza B NOT DETECTED NOT DETECTED Final   Parainfluenza Virus 1 NOT DETECTED NOT DETECTED Final   Parainfluenza Virus 2 NOT DETECTED NOT DETECTED Final   Parainfluenza Virus 3 NOT DETECTED NOT DETECTED Final   Parainfluenza Virus 4 NOT DETECTED NOT DETECTED Final   Respiratory Syncytial Virus NOT DETECTED NOT DETECTED Final   Bordetella pertussis NOT DETECTED NOT DETECTED Final   Chlamydophila pneumoniae NOT DETECTED NOT DETECTED Final   Mycoplasma pneumoniae NOT  DETECTED NOT DETECTED Final    Comment: Performed at [facility], [place]. [address] [place], [place] [address]      Radiology Studies: DG CHEST PORT 1 VIEW  Result Date: 06/28/2019 CLINICAL DATA:  Fever. EXAM: PORTABLE CHEST 1 VIEW COMPARISON:  Chest x-rays dated 06/25/2019 and 11/11/2018 FINDINGS: Heart size and pulmonary vascularity are normal. Aortic atherosclerosis. Lungs are clear. Loop recorder in place. No significant bone abnormality. IMPRESSION: 1. No acute abnormalities. 2.  Aortic Atherosclerosis (ICD10-I70.0). Electronically Signed   By: Lorriane Shire M.D.   On: 06/28/2019 11:48      Scheduled Meds: . ferrous sulfate  325 mg Oral TID WC  . loratadine  10 mg Oral QHS  . Melatonin  4.5 mg Oral QHS  . midodrine  2.5 mg Oral Daily  . pantoprazole  40 mg Oral Daily  . potassium chloride  40 mEq Oral BID WC  . pravastatin  40 mg Oral QHS  . psyllium  1 packet Oral BID  . sodium chloride flush  3 mL Intravenous Q12H  . sodium chloride  1 g Oral Q breakfast   Continuous Infusions:   LOS: 3 days      Time spent: 25 minutes   Dessa Phi, DO [facility] 06/29/2019, 1:12 PM   Available via Epic secure chat 7am-7pm After these hours, please refer to coverage provider listed on [email or web address]

## 2019-06-29 NOTE — Plan of Care (Signed)
  Problem: Nutrition: Goal: Adequate nutrition will be maintained Outcome: Progressing   Problem: Pain Managment: Goal: General experience of comfort will improve Outcome: Progressing   Problem: Safety: Goal: Ability to remain free from injury will improve Outcome: Progressing   

## 2019-06-29 NOTE — Progress Notes (Signed)
Patient has temp of 101.1 after 650mg  acetaminophen. Placed ice packs on groin area and underarm . Will continue to continue to monitor patient.

## 2019-06-30 DIAGNOSIS — S065X9A Traumatic subdural hemorrhage with loss of consciousness of unspecified duration, initial encounter: Secondary | ICD-10-CM

## 2019-06-30 DIAGNOSIS — E871 Hypo-osmolality and hyponatremia: Secondary | ICD-10-CM

## 2019-06-30 DIAGNOSIS — G4733 Obstructive sleep apnea (adult) (pediatric): Secondary | ICD-10-CM

## 2019-06-30 DIAGNOSIS — I480 Paroxysmal atrial fibrillation: Secondary | ICD-10-CM

## 2019-06-30 LAB — GLUCOSE, CAPILLARY: Glucose-Capillary: 115 mg/dL — ABNORMAL HIGH (ref 70–99)

## 2019-06-30 LAB — URINE CULTURE: Culture: 80000 — AB

## 2019-06-30 LAB — BASIC METABOLIC PANEL
Anion gap: 9 (ref 5–15)
BUN: 20 mg/dL (ref 8–23)
CO2: 19 mmol/L — ABNORMAL LOW (ref 22–32)
Calcium: 8.2 mg/dL — ABNORMAL LOW (ref 8.9–10.3)
Chloride: 101 mmol/L (ref 98–111)
Creatinine, Ser: 0.82 mg/dL (ref 0.61–1.24)
GFR calc Af Amer: >60 mL/min (ref >60)
GFR calc non Af Amer: >60 mL/min (ref >60)
Glucose, Bld: 120 mg/dL — ABNORMAL HIGH (ref 70–99)
Potassium: 4 mmol/L (ref 3.5–5.1)
Sodium: 129 mmol/L — ABNORMAL LOW (ref 135–145)

## 2019-06-30 LAB — CBC
HCT: 32.7 % — ABNORMAL LOW (ref 39.0–52.0)
Hemoglobin: 11.3 g/dL — ABNORMAL LOW (ref 13.0–17.0)
MCH: 28.3 pg (ref 26.0–34.0)
MCHC: 34.6 g/dL (ref 30.0–36.0)
MCV: 81.8 fL (ref 80.0–100.0)
Platelets: 195 10*3/uL (ref 150–400)
RBC: 4 MIL/uL — ABNORMAL LOW (ref 4.22–5.81)
RDW: 14.6 % (ref 11.5–15.5)
WBC: 6.7 10*3/uL (ref 4.0–10.5)
nRBC: 0 % (ref 0.0–0.2)

## 2019-06-30 MED ORDER — NITROFURANTOIN MONOHYD MACRO 100 MG PO CAPS
100.0000 mg | ORAL_CAPSULE | Freq: Two times a day (BID) | ORAL | Status: AC
  Administered 2019-06-30 – 2019-07-03 (×7): 100 mg via ORAL
  Filled 2019-06-30 (×9): qty 1

## 2019-06-30 MED ORDER — SODIUM CHLORIDE 0.9 % IV SOLN: Freq: Once | INTRAVENOUS | Status: AC

## 2019-06-30 MED ORDER — LOPERAMIDE HCL 2 MG PO CAPS
2.0000 mg | ORAL_CAPSULE | ORAL | Status: DC | PRN
  Administered 2019-06-30: 2 mg via ORAL
  Filled 2019-06-30: qty 1

## 2019-06-30 NOTE — Progress Notes (Signed)
Joe Glover Kitchen  PROGRESS NOTE    Joe Glover  [id number] DOB: 1924/10/23 DOA: 06/25/2019 PCP: [name], MD   Brief Narrative:   Joe Glover is a 85 y.o.malewith medical history significant ofparoxysmal atrial fibrillation, sick sinus syndrome, orthostatic hypotension and recurrent syncope, chronic hyponatremia, Parkinson's disease with autonomic dysfunction, dementia,hyperlipidemia, anemia, OSA, prostate cancer, subdural hematoma, subarachnoid hemorrhage, CVAwith residual dysarthria, and conditions listed below presenting to the ED for evaluation of syncope. Systolic was 70 upon EMS arrival. He was given fluid bolus.Patient did not fall or sustain any injuries to his head.   He was seen earlier same day in the ED for the same problem and his systolic was in the 0000000 at that time. Head CT showing stable previously demonstrated subdural hematoma along the posterior aspect of the interhemispheric falx and tentorium on the right. However, a new component of layering over the right frontoparietal convexity, measuring up to 8 mm in thickness. The small foci of acute intraparenchymal hemorrhage in the left frontal lobe appeared improved. No other new areas of acute intracranial hemorrhage. 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 ED provider had discussed the case with Dr. Trenton Gammon from neurosurgery who felt that this new finding on CT was redistribution of old blood loss and not a new bleed. He did not feel that any neurosurgical intervention was necessary. Hospital admission was offered at that time but patient and his wife declined.   Patient was recently admitted 12/10-12/11 for syncope resulting in a fall and head injury. CT head revealed subdural hematoma andintraparenchymal hematoma.  Patient is now admitted for recurrent orthostatic hypotension and syncope.   06/30/19: transition abx to macrobid   Assessment & Plan:   Principal Problem:  Syncope Active Problems:   OSA (obstructive sleep apnea)   Hyponatremia   PAF (paroxysmal atrial fibrillation) (HCC)   Subdural hematoma (HCC)  Recurrent syncope with orthostatic hypotension severe autonomic dysfunction from Parkinson's disease     - Follows with Dr. [name], cardiology, as an outpatient     - positive orthostatic     - midodrine daily; BPs better  UTI     - UA was unremarkable but UCx growing e coli that is pansensative     - cipro started; transition to macrobid   History of subdural hematoma and intraparenchymal hemorrhage     - Patient recently admitted 12/10 for syncope resulting in fall and head injury; head CT revealed subdural hematoma and intraparenchymal hematoma.      - Repeat Head CT showing stable previously demonstrated subdural hematoma along the posterior aspect of the interhemispheric falx and tentorium on the right.However, a new component of layering over the right frontoparietal convexity, measuring up to 8 mm in thickness. The small foci of acute intraparenchymal hemorrhage in the left frontal lobe appeared improved. No other new areas of acute intracranial hemorrhage. 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      - ED provider had discussed the case with Dr. Trenton Gammon from neurosurgery who felt that this new finding on CT was redistribution of old blood loss and not a new bleed.He did not feel that any neurosurgical intervention was necessary     - Hold off on any antiplatelet/anticoagulants   Paroxysmal atrial fibrillation     - Currently in sinus rhythm.      - Not on any rate controlling medications.      - Not a candidate for anticoagulation given advanced age, history of falls,  GI bleed, and subdural hematoma/intraparenchymal hemorrhage  Chronic hyponatremia     - Sodium ranging between 127-130 during recent hospitalization     - Continue home sodium chloride tablets  Physical deconditioning     - PT  evaluation recommending SNF  OSA     - Continue CPAP  Parkinson's disease with dementia      - Has some mild cognitive impairment   Poor PO intake     - encourage diet     - will give some fluids  Normocytic anemia     - no evidence of frank bleed     - monitor  DVT prophylaxis: SCDs Code Status: DNR Family Communication: With wife at bedside   Disposition Plan: TBD   Subjective: No acute events ON.   Objective: Vitals:   06/29/19 1746 06/29/19 2204 06/30/19 0016 06/30/19 0552  BP: (!) 151/7 (!) 141/88 136/76 124/79  Pulse: 94 96 87 88  Resp: 20     Temp: 98.3 F (36.8 C) (!) 97.4 F (36.3 C) 98.5 F (36.9 C) 98.8 F (37.1 C)  TempSrc: Oral Oral Oral Oral  SpO2: 99% 98% 100% 97%  Weight:    68.7 kg  Height:        Intake/Output Summary (Last 24 hours) at 06/30/2019 1601 Last data filed at 06/30/2019 0840 Gross per 24 hour  Intake 340 ml  Output --  Net 340 ml   Filed Weights   06/28/19 0654 06/29/19 0410 06/30/19 0552  Weight: 70.6 kg 67.7 kg 68.7 kg    Examination:  General: 85 y.o. male resting in bed in NAD Cardiovascular: RRR, +S1, S2, no m/g/r, equal pulses throughout Respiratory: CTABL, no w/r/r, normal WOB GI: BS+, NDNT, no masses noted, no organomegaly noted MSK: No e/c/c Neuro: somnolent   Data Reviewed: I have personally reviewed following labs and imaging studies.  CBC: Recent Labs  Lab 06/25/19 1308 06/25/19 2046 06/29/19 0351 06/30/19 0308  WBC 4.3 5.8 7.1 6.7  NEUTROABS 2.4 4.1  --   --   HGB 13.3 13.7 12.0* 11.3*  HCT 39.7 41.8 34.9* 32.7*  MCV 83.9 85.3 81.2 81.8  PLT 191 221 166 0000000   Basic Metabolic Panel: Recent Labs  Lab 06/26/19 0459 06/27/19 0351 06/28/19 0413 06/29/19 0351 06/30/19 0308  NA 131* 130* 132* 129* 129*  K 4.2 3.5 3.6 3.1* 4.0  CL 97* 97* 100 99 101  CO2 22 21* 21* 20* 19*  GLUCOSE 77 95 121* 112* 120*  BUN 8 13 14 17 20   CREATININE 0.78 0.90 0.86 0.93 0.82  CALCIUM 8.5* 8.6* 8.3* 8.3*  8.2*   GFR: Estimated Creatinine Clearance: 55.9 mL/min (by C-G formula based on SCr of 0.82 mg/dL). Liver Function Tests: Recent Labs  Lab 06/25/19 1308  AST 17  ALT 13  ALKPHOS 61  BILITOT 0.8  PROT 6.8  ALBUMIN 3.3*   No results for input(s): LIPASE, AMYLASE in the last 168 hours. No results for input(s): AMMONIA in the last 168 hours. Coagulation Profile: No results for input(s): INR, PROTIME in the last 168 hours. Cardiac Enzymes: No results for input(s): CKTOTAL, CKMB, CKMBINDEX, TROPONINI in the last 168 hours. BNP (last 3 results) No results for input(s): PROBNP in the last 8760 hours. HbA1C: No results for input(s): HGBA1C in the last 72 hours. CBG: Recent Labs  Lab 06/25/19 2002 06/29/19 0412 06/30/19 0554  GLUCAP 73 103* 115*   Lipid Profile: No results for input(s): CHOL, HDL, LDLCALC, TRIG, CHOLHDL, LDLDIRECT  in the last 72 hours. Thyroid Function Tests: No results for input(s): TSH, T4TOTAL, FREET4, T3FREE, THYROIDAB in the last 72 hours. Anemia Panel: No results for input(s): VITAMINB12, FOLATE, FERRITIN, TIBC, IRON, RETICCTPCT in the last 72 hours. Sepsis Labs: No results for input(s): PROCALCITON, LATICACIDVEN in the last 168 hours.  Recent Results (from the past 240 hour(s))  SARS CORONAVIRUS 2 (TAT 6-24 HRS) Nasopharyngeal Nasopharyngeal Swab     Status: None   Collection Time: 06/25/19  8:46 PM   Specimen: Nasopharyngeal Swab  Result Value Ref Range Status   SARS Coronavirus 2 NEGATIVE NEGATIVE Final    Comment: (NOTE) SARS-CoV-2 target nucleic acids are NOT DETECTED. The SARS-CoV-2 RNA is generally detectable in upper and lower respiratory specimens during the acute phase of infection. Negative results do not preclude SARS-CoV-2 infection, do not rule out co-infections with other pathogens, and should not be used as the sole basis for treatment or other patient management decisions. Negative results must be combined with clinical  observations, patient history, and epidemiological information. The expected result is Negative. Fact Sheet for Patients: SugarRoll.be Fact Sheet for Healthcare Providers: https://www.woods-mathews.com/ This test is not yet approved or cleared by the [place] FDA and  has been authorized for detection and/or diagnosis of SARS-CoV-2 by FDA under an Emergency Use Authorization (EUA). This EUA will remain  in effect (meaning this test can be used) for the duration of the COVID-19 declaration under Section 56 4(b)(1) of the Act, 21 U.S.C. section 360bbb-3(b)(1), unless the authorization is terminated or revoked sooner. Performed at [facility], [place] [address] [place], [place] [address]   Culture, blood (routine x 2)     Status: None (Preliminary result)   Collection Time: 06/28/19  7:50 AM   Specimen: BLOOD LEFT HAND  Result Value Ref Range Status   Specimen Description BLOOD LEFT HAND  Final   Special Requests   Final    BOTTLES DRAWN AEROBIC ONLY Blood Culture results may not be optimal due to an inadequate volume of blood received in culture bottles   Culture   Final    NO GROWTH 2 DAYS Performed at [facility], [place] [address] [place], [place] [address]    Report Status PENDING  Incomplete  Culture, blood (routine x 2)     Status: None (Preliminary result)   Collection Time: 06/28/19  7:55 AM   Specimen: BLOOD LEFT HAND  Result Value Ref Range Status   Specimen Description BLOOD LEFT HAND  Final   Special Requests   Final    BOTTLES DRAWN AEROBIC ONLY Blood Culture adequate volume   Culture   Final    NO GROWTH 2 DAYS Performed at [facility], [place] [address] [place], [place] [address]    Report Status PENDING  Incomplete  Culture, Urine     Status: Abnormal   Collection Time: 06/28/19  3:15 PM   Specimen: Urine, Random  Result Value Ref Range Status   Specimen Description URINE, RANDOM  Final   Special  Requests   Final    NONE Performed at [facility], [place] [address] [place], [place] [address]    Culture 80,000 COLONIES/mL ESCHERICHIA COLI (A)  Final   Report Status 06/30/2019 FINAL  Final   Organism ID, Bacteria ESCHERICHIA COLI (A)  Final      Susceptibility   Escherichia coli - MIC*    AMPICILLIN >=32 RESISTANT Resistant     CEFAZOLIN <=4 SENSITIVE Sensitive  CEFTRIAXONE <=1 SENSITIVE Sensitive     CIPROFLOXACIN 1 SENSITIVE Sensitive     GENTAMICIN >=16 RESISTANT Resistant     IMIPENEM <=0.25 SENSITIVE Sensitive     NITROFURANTOIN <=16 SENSITIVE Sensitive     TRIMETH/SULFA >=320 RESISTANT Resistant     AMPICILLIN/SULBACTAM >=32 RESISTANT Resistant     PIP/TAZO <=4 SENSITIVE Sensitive     * 80,000 COLONIES/mL ESCHERICHIA COLI  Respiratory Panel by PCR     Status: None   Collection Time: 06/29/19 10:27 AM   Specimen: Nasopharyngeal Swab; Respiratory  Result Value Ref Range Status   Adenovirus NOT DETECTED NOT DETECTED Final   Coronavirus 229E NOT DETECTED NOT DETECTED Final    Comment: (NOTE) The Coronavirus on the Respiratory Panel, DOES NOT test for the novel  Coronavirus (2019 nCoV)    Coronavirus HKU1 NOT DETECTED NOT DETECTED Final   Coronavirus NL63 NOT DETECTED NOT DETECTED Final   Coronavirus OC43 NOT DETECTED NOT DETECTED Final   Metapneumovirus NOT DETECTED NOT DETECTED Final   Rhinovirus / Enterovirus NOT DETECTED NOT DETECTED Final   Influenza A NOT DETECTED NOT DETECTED Final   Influenza B NOT DETECTED NOT DETECTED Final   Parainfluenza Virus 1 NOT DETECTED NOT DETECTED Final   Parainfluenza Virus 2 NOT DETECTED NOT DETECTED Final   Parainfluenza Virus 3 NOT DETECTED NOT DETECTED Final   Parainfluenza Virus 4 NOT DETECTED NOT DETECTED Final   Respiratory Syncytial Virus NOT DETECTED NOT DETECTED Final   Bordetella pertussis NOT DETECTED NOT DETECTED Final   Chlamydophila pneumoniae NOT DETECTED NOT DETECTED Final   Mycoplasma pneumoniae NOT  DETECTED NOT DETECTED Final    Comment: Performed at [facility], [place]. [address] [place], [place] [address]      Radiology Studies: No results found.   Scheduled Meds: . ferrous sulfate  325 mg Oral TID WC  . loratadine  10 mg Oral QHS  . Melatonin  4.5 mg Oral QHS  . midodrine  2.5 mg Oral Daily  . nitrofurantoin (macrocrystal-monohydrate)  100 mg Oral Q12H  . pantoprazole  40 mg Oral Daily  . pravastatin  40 mg Oral QHS  . psyllium  1 packet Oral BID  . sodium chloride flush  3 mL Intravenous Q12H  . sodium chloride  1 g Oral Q breakfast   Continuous Infusions:   LOS: 4 days    Time spent: 25 minutes spent in the coordination of care today.    Jonnie Finner, DO [facility] Pager [phone]  If 7PM-7AM, please contact night-coverage [email or web address] Password [facility] 06/30/2019, 4:01 PM

## 2019-06-30 NOTE — Plan of Care (Signed)
  Problem: Nutrition: Goal: Adequate nutrition will be maintained Outcome: Progressing   Problem: Pain Managment: Goal: General experience of comfort will improve Outcome: Progressing   Problem: Safety: Goal: Ability to remain free from injury will improve Outcome: Progressing   

## 2019-06-30 NOTE — TOC Progression Note (Signed)
Transition of Care [facility]) - Progression Note    Patient Details  Name: Detavious Bainbridge MRN: [id number] Date of Birth: 02/10/1925  Transition of Care [facility]) CM/SW Contact  Graves-Bigelow, Ocie Cornfield, RN Phone Number: 06/30/2019, 2:22 PM  Clinical Narrative:  Case Manager spoke with patient and wife this morning regarding transition of care needs. Wife was concerned that patient is not at baseline with ambulation and he needs to be walking prior to transition out of hospital. Case Manager did discuss [place] with wife and she is declining facility at this time- wife wants him home. Hospital bed should be delivered today. Case Manager spoke with wife and made her aware that patient may be medically stable to transition home today vs tomorrow. Family needs to coordinate care with a personal care agency for 24 hours supervision to provide the family support in taking care of patient. Home Health Services set up with [facility]. Orders will need to be written for Registered Nurse, Physical Therapy, Aide. No further needs from Case Manager at this time.     Expected Discharge Plan: [place] Barriers to Discharge: Continued Medical Work up(+ orthostatics IV fluids.)  Expected Discharge Plan and Services Expected Discharge Plan: [place] In-house Referral: NA Discharge Planning Services: CM Consult Post Acute Care Choice: Home Health, Resumption of Svcs/PTA Provider Living arrangements for the past 2 months: Single Family Home                 DME Arranged: Hospital bed DME Agency: [employer] Date DME Agency Contacted: 06/28/19 Time DME Agency Contacted: 1440 Representative spoke with at DME Agency: Thedore [place]ins [place]: PT, RN, Disease Management, Nurse's Aide [place] Agency: [facility] (formerly [employer]) Date [place]: 06/28/19 Time [place]: 1440 Representative spoke with at [place]:  [place] ([place]) Interventions    Readmission Risk Interventions No flowsheet data found.

## 2019-06-30 NOTE — Care Management Important Message (Signed)
Important Message  Patient Details  Name: Joe Glover MRN: [id number] Date of Birth: July 31, 1924   Medicare Important Message Given:  Yes     Shelda Altes 06/30/2019, 1:12 PM

## 2019-06-30 NOTE — Progress Notes (Signed)
Checked patient and he was already placed on his CPAP by RN or Family. And is tolerating well. No issues at this time.

## 2019-06-30 NOTE — Progress Notes (Signed)
Physical Therapy Treatment Patient Details Name: Joe Glover MRN: [id number] DOB: 04-13-1925 Today's Date: 06/30/2019    History of Present Illness 85 y.o. male with medical history significant of paroxysmal atrial fibrillation, sick sinus syndrome, orthostatic hypotension and recurrent syncope, chronic hyponatremia, Parkinson's disease with autonomic dysfunction, dementia, hyperlipidemia, anemia, OSA, prostate cancer, subdural hematoma, subarachnoid hemorrhage, CVA with residual dysarthria, and conditions listed below presenting to the ED for evaluation of syncope.    PT Comments    Patient received in bed, wife present and assisted in communicating with patient today; note speech is quite mumbled however spouse able to translate, reports this is baseline along with hearing impairment. Found to be completely soiled with stool, able to roll with ModA for pericare with total assist. Then required MinA for supine to sit, Mod-MaxA for sit to stand, and MaxA for pivot to chair with patient very weak and tremulous with posterior bias eventually needing totalA for safe lower to chair due to LOB. VSS and BP stable during session. NT and RN educated regarding patient status and use of stedy for back to bed. Updated PT recommendations to SNF and 24/7A, patient and spouse agreeable.     Follow Up Recommendations  SNF;Supervision/Assistance - 24 hour     Equipment Recommendations  Other (comment)(defer)    Recommendations for Other Services       Precautions / Restrictions Precautions Precautions: Fall Precaution Comments: h/o syncope Restrictions Weight Bearing Restrictions: No    Mobility  Bed Mobility Overal bed mobility: Needs Assistance Bed Mobility: Supine to Sit     Supine to sit: Min assist     General bed mobility comments: MinA to boost trunk up to sitting, increased time and effort  Transfers Overall transfer level: Needs assistance Equipment used: Rolling walker (2  wheeled) Transfers: Sit to/from [facility] Sit to Stand: Mod assist;Max assist         General transfer comment: ModA on first attempt, MaxA on second attempt, posterior lean and difficulty gaining balance; MaxA for pivot to chair, patient very tremulous and with poor coordination, at one point picked up RW and put it down and one leg got trapped in bedrail resulting in need for totalA for lower to chair  Ambulation/Gait             General Gait Details: deferred   Stairs             Wheelchair Mobility    Modified Rankin (Stroke Patients Only)       Balance Overall balance assessment: Needs assistance Sitting-balance support: Feet supported;Bilateral upper extremity supported Sitting balance-Leahy Scale: Good Sitting balance - Comments: min guard for safety   Standing balance support: Bilateral upper extremity supported;During functional activity Standing balance-Leahy Scale: Poor Standing balance comment: reliant on RW and external support                            Cognition Arousal/Alertness: Awake/alert Behavior During Therapy: WFL for tasks assessed/performed Overall Cognitive Status: Difficult to assess                                 General Comments: difficult to assess due to mumbled and garbled speech, wife reports this is baseline and he also has hearing issues      Exercises      General Comments General comments (skin integrity, edema, etc.): VSS, orthostatics negative  and BP stable during session, asymptomatic      Pertinent Vitals/Pain Pain Assessment: Faces Pain Score: 0-No pain Faces Pain Scale: No hurt Pain Intervention(s): Limited activity within patient's tolerance;Monitored during session    Home Living                      Prior Function            PT Goals (current goals can now be found in the care plan section) Acute Rehab PT Goals Patient Stated Goal: to improve BP  management and reduce falls risk PT Goal Formulation: With patient/family Time For Goal Achievement: 07/11/19 Potential to Achieve Goals: Fair Progress towards PT goals: Progressing toward goals    Frequency    Min 3X/week      PT Plan Discharge plan needs to be updated;Frequency needs to be updated    Co-evaluation              AM-PAC PT "6 Clicks" Mobility   Outcome Measure  Help needed turning from your back to your side while in a flat bed without using bedrails?: A Little Help needed moving from lying on your back to sitting on the side of a flat bed without using bedrails?: A Little Help needed moving to and from a bed to a chair (including a wheelchair)?: A Lot Help needed standing up from a chair using your arms (e.g., wheelchair or bedside chair)?: A Lot Help needed to walk in hospital room?: Total Help needed climbing 3-5 steps with a railing? : Total 6 Click Score: 12    End of Session Equipment Utilized During Treatment: Gait belt Activity Tolerance: Patient limited by fatigue;Patient tolerated treatment well Patient left: in chair;with call bell/phone within reach;with chair alarm set Nurse Communication: Mobility status;Need for lift equipment;Other (comment)(BP stable and patient asymptomatic during session) PT Visit Diagnosis: Muscle weakness (generalized) (M62.81);Dizziness and giddiness (R42)     Time: [id number] PT Time Calculation (min) (ACUTE ONLY): 47 min  Charges:  $Therapeutic Activity: 38-52 mins                     Windell Norfolk, DPT, PN1   Supplemental Physical Therapist [place]    Pager [phone] Acute Rehab Office [phone]

## 2019-07-01 LAB — RENAL FUNCTION PANEL
Albumin: 2.3 g/dL — ABNORMAL LOW (ref 3.5–5.0)
Anion gap: 11 (ref 5–15)
BUN: 18 mg/dL (ref 8–23)
CO2: 19 mmol/L — ABNORMAL LOW (ref 22–32)
Calcium: 8.1 mg/dL — ABNORMAL LOW (ref 8.9–10.3)
Chloride: 102 mmol/L (ref 98–111)
Creatinine, Ser: 1.17 mg/dL (ref 0.61–1.24)
GFR calc Af Amer: >60 mL/min (ref >60)
GFR calc non Af Amer: 54 mL/min — ABNORMAL LOW (ref >60)
Glucose, Bld: 124 mg/dL — ABNORMAL HIGH (ref 70–99)
Phosphorus: 3 mg/dL (ref 2.5–4.6)
Potassium: 3.7 mmol/L (ref 3.5–5.1)
Sodium: 132 mmol/L — ABNORMAL LOW (ref 135–145)

## 2019-07-01 LAB — MAGNESIUM: Magnesium: 1.5 mg/dL — ABNORMAL LOW (ref 1.7–2.4)

## 2019-07-01 LAB — CBC WITH DIFFERENTIAL/PLATELET
Abs Immature Granulocytes: 0.01 10*3/uL (ref 0.00–0.07)
Basophils Absolute: 0 10*3/uL (ref 0.0–0.1)
Basophils Relative: 1 %
Eosinophils Absolute: 0.1 10*3/uL (ref 0.0–0.5)
Eosinophils Relative: 3 %
HCT: 32.9 % — ABNORMAL LOW (ref 39.0–52.0)
Hemoglobin: 11 g/dL — ABNORMAL LOW (ref 13.0–17.0)
Immature Granulocytes: 0 %
Lymphocytes Relative: 15 %
Lymphs Abs: 0.6 10*3/uL — ABNORMAL LOW (ref 0.7–4.0)
MCH: 27.6 pg (ref 26.0–34.0)
MCHC: 33.4 g/dL (ref 30.0–36.0)
MCV: 82.7 fL (ref 80.0–100.0)
Monocytes Absolute: 0.5 10*3/uL (ref 0.1–1.0)
Monocytes Relative: 11 %
Neutro Abs: 2.9 10*3/uL (ref 1.7–7.7)
Neutrophils Relative %: 70 %
Platelets: 216 10*3/uL (ref 150–400)
RBC: 3.98 MIL/uL — ABNORMAL LOW (ref 4.22–5.81)
RDW: 14.8 % (ref 11.5–15.5)
WBC: 4.1 10*3/uL (ref 4.0–10.5)
nRBC: 0 % (ref 0.0–0.2)

## 2019-07-01 LAB — GLUCOSE, CAPILLARY: Glucose-Capillary: 109 mg/dL — ABNORMAL HIGH (ref 70–99)

## 2019-07-01 MED ORDER — MAGNESIUM OXIDE 400 (241.3 MG) MG PO TABS
400.0000 mg | ORAL_TABLET | Freq: Two times a day (BID) | ORAL | Status: DC
  Administered 2019-07-01 – 2019-07-02 (×2): 400 mg via ORAL
  Filled 2019-07-01 (×3): qty 1

## 2019-07-01 NOTE — TOC Progression Note (Signed)
Transition of Care [facility]) - Progression Note    Patient Details  Name: Joe Glover MRN: [id number] Date of Birth: 08-13-1924  Transition of Care [facility]) CM/SW Contact  Graves-Bigelow, Ocie Cornfield, RN Phone Number: 07/01/2019, 2:58 PM  Clinical Narrative:   Case Manager received notification from wife that she has changed her mind and wants patient to go to a skilled nursing facility. Case Manager spoke with wife and the first choice is [facility]. CM did call Admissions Coordinator and bed should be available on Monday at the earliest. Please call Admissions Coordinator Olivia Mackie to verify at [phone] or [phone]. FL2 completed and faxed via the hub to [place]. PASRR pending- case manager attempted to do the PASRR; however received an error message and all of the information was correct. Unable to call PASRR to verify what the error message was for due to closed for the Holidays. Please follow up with PASRR. Case Manager will continue to follow the patient.   Expected Discharge Plan: Skilled Nursing Facility(W47fe changed her mind regarding SNF- [facility].) Barriers to Discharge: Continued Medical Work up(+ orthostatics IV fluids.)  Expected Discharge Plan and Services Expected Discharge Plan: Skilled Nursing Facility(W19fe changed her mind regarding SNF- [facility].) In-house Referral: NA Discharge Planning Services: CM Consult Post Acute Care Choice: [place] Living arrangements for the past 2 months: Single Family Home                 DME Arranged: Hospital bed DME Agency: [employer] Date DME Agency Contacted: 06/28/19 Time DME Agency Contacted: 1440 Representative spoke with at DME Agency: Thedore Mins [place]: PT, RN, Disease Management, Nurse's Aide [place] Agency: [facility] [employer] Date [place]: 06/28/19 Time [place]: 1440 Representative spoke with at [place]:  [place] ([place]) Interventions    Readmission Risk Interventions No flowsheet data found.

## 2019-07-01 NOTE — Progress Notes (Signed)
[name] Kitchen  PROGRESS NOTE    Joe Glover  [id number] DOB: 11/19/1924 DOA: 06/25/2019 PCP: [name], MD   Brief Narrative:   Joe Glover a 85 y.o.malewith medical history significant ofparoxysmal atrial fibrillation, sick sinus syndrome, orthostatic hypotension and recurrent syncope, chronic hyponatremia, Parkinson's disease with autonomic dysfunction, dementia,hyperlipidemia, anemia, OSA, prostate cancer, subdural hematoma, subarachnoid hemorrhage, CVAwith residual dysarthria, and conditions listed below presenting to the ED for evaluation of syncope. Systolic was 70 upon EMS arrival. He was given fluid bolus.Patient did not fall or sustain any injuries to his head.   He was seen earlier same day in the ED for the same problem and his systolic was in the 0000000 at that time. Head CT showing stable previously demonstrated subdural hematoma along the posterior aspect of the interhemispheric falx and tentorium on the right. However, a new component of layering over the right frontoparietal convexity, measuring up to 8 mm in thickness. The small foci of acute intraparenchymal hemorrhage in the left frontal lobe appeared improved. No other new areas of acute intracranial hemorrhage. 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 ED provider had discussed the case with Dr. Trenton Gammon from neurosurgery who felt that this new finding on CT was redistribution of old blood loss and not a new bleed. He did not feel that any neurosurgical intervention was necessary. Hospital admission was offered at that time but patient and his wife declined.   Patient was recently admitted 12/10-12/11 for syncope resulting in a fall and head injury. CT head revealed subdural hematoma andintraparenchymal hematoma.  Patient is now admitted for recurrent orthostatic hypotension and syncope.   07/01/19: More active today. Denies complaints.    Assessment & Plan:   Principal  Problem:   Syncope Active Problems:   OSA (obstructive sleep apnea)   Hyponatremia   PAF (paroxysmal atrial fibrillation) (HCC)   Subdural hematoma (HCC)  Recurrent syncope with orthostatic hypotension severe autonomic dysfunction from Parkinson's disease     - Follows with Dr. [name], cardiology, as an outpatient     - positive orthostatic     - midodrine daily; BPs better  UTI     - UA was unremarkable but UCx growing e coli that is pansensative     - cipro started; transitioned to macrobid; doing well   History of subdural hematoma and intraparenchymal hemorrhage     - Patient recently admitted 12/10 for syncope resulting in fall and head injury; head CT revealed subdural hematoma and intraparenchymal hematoma.      - Repeat Head CT showing stable previously demonstrated subdural hematoma along the posterior aspect of the interhemispheric falx and tentorium on the right.However, a new component of layering over the right frontoparietal convexity, measuring up to 8 mm in thickness. The small foci of acute intraparenchymal hemorrhage in the left frontal lobe appeared improved. No other new areas of acute intracranial hemorrhage. 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      - ED provider had discussed the case with Dr. Trenton Gammon from neurosurgery who felt that this new finding on CT was redistribution of old blood loss and not a new bleed.He did not feel that any neurosurgical intervention was necessary     - Hold off on any antiplatelet/anticoagulants   Paroxysmal atrial fibrillation     - Currently in sinus rhythm.      - Not on any rate controlling medications.      - Not a candidate for anticoagulation given  advanced age, history of falls, GI bleed, and subdural hematoma/intraparenchymal hemorrhage  Chronic hyponatremia     - Sodium ranging between 127-130 during recent hospitalization     - Continue home sodium chloride tablets     - Na+  improved, monitor  Physical deconditioning     - PT evaluation recommending SNF; working on options  OSA     - Continue CPAP  Parkinson's disease with dementia      - Has some mild cognitive impairment   Poor PO intake     - encourage diet  Normocytic anemia     - no evidence of frank bleed     - monitor  Hypomagnesemia     - replete, monitor  DVT prophylaxis: SCDs Code Status: DNR Family Communication: With wife at bedside   Disposition Plan: TO SNF when bed available   Antimicrobials:  [name] Kitchen Macrobid   Subjective: "I'm doing good!"  Objective: Vitals:   06/30/19 0552 06/30/19 2109 07/01/19 0030 07/01/19 0542  BP: 124/79 122/85  112/81  Pulse: 88 92 93 85  Resp:   (!) 23   Temp: 98.8 F (37.1 C) 97.8 F (36.6 C)  98.4 F (36.9 C)  TempSrc: Oral Oral  Oral  SpO2: 97% 97% 98% 100%  Weight: 68.7 kg     Height:        Intake/Output Summary (Last 24 hours) at 07/01/2019 1559 Last data filed at 06/30/2019 2113 Gross per 24 hour  Intake 0 ml  Output 200 ml  Net -200 ml   Filed Weights   06/28/19 0654 06/29/19 0410 06/30/19 0552  Weight: 70.6 kg 67.7 kg 68.7 kg    Examination:  General: 85 y.o. male resting in bed in NAD Cardiovascular: RRR, +S1, S2, no m/g/r Respiratory: CTABL, no w/r/r GI: BS+, NDNT, soft MSK: No e/c/c Neuro: alert, follows commands Psyc: Appropriate interaction and affect, calm/cooperative   Data Reviewed: I have personally reviewed following labs and imaging studies.  CBC: Recent Labs  Lab 06/25/19 1308 06/25/19 2046 06/29/19 0351 06/30/19 0308 07/01/19 1016  WBC 4.3 5.8 7.1 6.7 4.1  NEUTROABS 2.4 4.1  --   --  2.9  HGB 13.3 13.7 12.0* 11.3* 11.0*  HCT 39.7 41.8 34.9* 32.7* 32.9*  MCV 83.9 85.3 81.2 81.8 82.7  PLT 191 221 166 195 [id number]   Basic Metabolic Panel: Recent Labs  Lab 06/27/19 0351 06/28/19 0413 06/29/19 0351 06/30/19 0308 07/01/19 1016  NA 130* 132* 129* 129* 132*  K 3.5 3.6 3.1* 4.0 3.7  CL 97*  100 99 101 102  CO2 21* 21* 20* 19* 19*  GLUCOSE 95 121* 112* 120* 124*  BUN 13 14 17 20 18   CREATININE 0.90 0.86 0.93 0.82 1.17  CALCIUM 8.6* 8.3* 8.3* 8.2* 8.1*  MG  --   --   --   --  1.5*  PHOS  --   --   --   --  3.0   GFR: Estimated Creatinine Clearance: 39.1 mL/min (by C-G formula based on SCr of 1.17 mg/dL). Liver Function Tests: Recent Labs  Lab 06/25/19 1308 07/01/19 1016  AST 17  --   ALT 13  --   ALKPHOS 61  --   BILITOT 0.8  --   PROT 6.8  --   ALBUMIN 3.3* 2.3*   No results for input(s): LIPASE, AMYLASE in the last 168 hours. No results for input(s): AMMONIA in the last 168 hours. Coagulation Profile: No results for input(s): INR, PROTIME in  the last 168 hours. Cardiac Enzymes: No results for input(s): CKTOTAL, CKMB, CKMBINDEX, TROPONINI in the last 168 hours. BNP (last 3 results) No results for input(s): PROBNP in the last 8760 hours. HbA1C: No results for input(s): HGBA1C in the last 72 hours. CBG: Recent Labs  Lab 06/25/19 2002 06/29/19 0412 06/30/19 0554 07/01/19 0540  GLUCAP 73 103* 115* 109*   Lipid Profile: No results for input(s): CHOL, HDL, LDLCALC, TRIG, CHOLHDL, LDLDIRECT in the last 72 hours. Thyroid Function Tests: No results for input(s): TSH, T4TOTAL, FREET4, T3FREE, THYROIDAB in the last 72 hours. Anemia Panel: No results for input(s): VITAMINB12, FOLATE, FERRITIN, TIBC, IRON, RETICCTPCT in the last 72 hours. Sepsis Labs: No results for input(s): PROCALCITON, LATICACIDVEN in the last 168 hours.  Recent Results (from the past 240 hour(s))  SARS CORONAVIRUS 2 (TAT 6-24 HRS) Nasopharyngeal Nasopharyngeal Swab     Status: None   Collection Time: 06/25/19  8:46 PM   Specimen: Nasopharyngeal Swab  Result Value Ref Range Status   SARS Coronavirus 2 NEGATIVE NEGATIVE Final    Comment: (NOTE) SARS-CoV-2 target nucleic acids are NOT DETECTED. The SARS-CoV-2 RNA is generally detectable in upper and lower respiratory specimens during the  acute phase of infection. Negative results do not preclude SARS-CoV-2 infection, do not rule out co-infections with other pathogens, and should not be used as the sole basis for treatment or other patient management decisions. Negative results must be combined with clinical observations, patient history, and epidemiological information. The expected result is Negative. Fact Sheet for Patients: SugarRoll.be Fact Sheet for Healthcare Providers: https://www.woods-mathews.com/ This test is not yet approved or cleared by the [place] FDA and  has been authorized for detection and/or diagnosis of SARS-CoV-2 by FDA under an Emergency Use Authorization (EUA). This EUA will remain  in effect (meaning this test can be used) for the duration of the COVID-19 declaration under Section 56 4(b)(1) of the Act, 21 U.S.C. section 360bbb-3(b)(1), unless the authorization is terminated or revoked sooner. Performed at [facility], [place] [address], [place], [place] [address]   Culture, blood (routine x 2)     Status: None (Preliminary result)   Collection Time: 06/28/19  7:50 AM   Specimen: BLOOD LEFT HAND  Result Value Ref Range Status   Specimen Description BLOOD LEFT HAND  Final   Special Requests   Final    BOTTLES DRAWN AEROBIC ONLY Blood Culture results may not be optimal due to an inadequate volume of blood received in culture bottles   Culture   Final    NO GROWTH 3 DAYS Performed at [facility], [place] [address], [place], [place] [address]    Report Status PENDING  Incomplete  Culture, blood (routine x 2)     Status: None (Preliminary result)   Collection Time: 06/28/19  7:55 AM   Specimen: BLOOD LEFT HAND  Result Value Ref Range Status   Specimen Description BLOOD LEFT HAND  Final   Special Requests   Final    BOTTLES DRAWN AEROBIC ONLY Blood Culture adequate volume   Culture   Final    NO GROWTH 3 DAYS Performed at [facility], [place] [address] [place], [place] [address]    Report Status PENDING  Incomplete  Culture, Urine     Status: Abnormal   Collection Time: 06/28/19  3:15 PM   Specimen: Urine, Random  Result Value Ref Range Status   Specimen Description URINE, RANDOM  Final   Special  Requests   Final    NONE Performed at [facility], [place] [address], [place], [place] [address]    Culture 80,000 COLONIES/mL ESCHERICHIA COLI (A)  Final   Report Status 06/30/2019 FINAL  Final   Organism ID, Bacteria ESCHERICHIA COLI (A)  Final      Susceptibility   Escherichia coli - MIC*    AMPICILLIN >=32 RESISTANT Resistant     CEFAZOLIN <=4 SENSITIVE Sensitive     CEFTRIAXONE <=1 SENSITIVE Sensitive     CIPROFLOXACIN 1 SENSITIVE Sensitive     GENTAMICIN >=16 RESISTANT Resistant     IMIPENEM <=0.25 SENSITIVE Sensitive     NITROFURANTOIN <=16 SENSITIVE Sensitive     TRIMETH/SULFA >=320 RESISTANT Resistant     AMPICILLIN/SULBACTAM >=32 RESISTANT Resistant     PIP/TAZO <=4 SENSITIVE Sensitive     * 80,000 COLONIES/mL ESCHERICHIA COLI  Respiratory Panel by PCR     Status: None   Collection Time: 06/29/19 10:27 AM   Specimen: Nasopharyngeal Swab; Respiratory  Result Value Ref Range Status   Adenovirus NOT DETECTED NOT DETECTED Final   Coronavirus 229E NOT DETECTED NOT DETECTED Final    Comment: (NOTE) The Coronavirus on the Respiratory Panel, DOES NOT test for the novel  Coronavirus (2019 nCoV)    Coronavirus HKU1 NOT DETECTED NOT DETECTED Final   Coronavirus NL63 NOT DETECTED NOT DETECTED Final   Coronavirus OC43 NOT DETECTED NOT DETECTED Final   Metapneumovirus NOT DETECTED NOT DETECTED Final   Rhinovirus / Enterovirus NOT DETECTED NOT DETECTED Final   Influenza A NOT DETECTED NOT DETECTED Final   Influenza B NOT DETECTED NOT DETECTED Final   Parainfluenza Virus 1 NOT DETECTED NOT DETECTED Final   Parainfluenza Virus 2 NOT DETECTED NOT DETECTED Final   Parainfluenza Virus 3 NOT DETECTED NOT  DETECTED Final   Parainfluenza Virus 4 NOT DETECTED NOT DETECTED Final   Respiratory Syncytial Virus NOT DETECTED NOT DETECTED Final   Bordetella pertussis NOT DETECTED NOT DETECTED Final   Chlamydophila pneumoniae NOT DETECTED NOT DETECTED Final   Mycoplasma pneumoniae NOT DETECTED NOT DETECTED Final    Comment: Performed at [facility], [place]. [address], [place], [place] [address]      Radiology Studies: No results found.   Scheduled Meds: . ferrous sulfate  325 mg Oral TID WC  . loratadine  10 mg Oral QHS  . Melatonin  4.5 mg Oral QHS  . midodrine  2.5 mg Oral Daily  . nitrofurantoin (macrocrystal-monohydrate)  100 mg Oral Q12H  . pantoprazole  40 mg Oral Daily  . pravastatin  40 mg Oral QHS  . psyllium  1 packet Oral BID  . sodium chloride flush  3 mL Intravenous Q12H  . sodium chloride  1 g Oral Q breakfast   Continuous Infusions:   LOS: 5 days    Time spent: 25 minutes spent in the coordination of care today.    Jonnie Finner, DO [facility]  If 7PM-7AM, please contact night-coverage [email or web address] 07/01/2019, 3:59 PM

## 2019-07-01 NOTE — Plan of Care (Signed)
  Problem: Clinical Measurements: Goal: Diagnostic test results will improve Outcome: Progressing   Problem: Activity: Goal: Risk for activity intolerance will decrease Outcome: Progressing   

## 2019-07-01 NOTE — Progress Notes (Signed)
RT offered assistance with CPAP from home but pts wife stated she could place pt on without assistance. RT checked and filled humidification chamber on home CPAP. Pt respiratory status stable at this time. RT will continue to monitor.

## 2019-07-01 NOTE — NC FL2 (Signed)
[place] MEDICAID FL2 LEVEL OF CARE SCREENING TOOL     IDENTIFICATION  Patient Name: Joe Glover Birthdate: Jan 24, 1925 Sex: male Admission Date (Current Location): 06/25/2019  [facility] and [place] Number:  Herbalist and Address:  [facility] [place], [place] [address], [place], [place] [address]      Provider Number: [id number]  Attending Physician Name and Address:  Jonnie Finner, DO  Relative Name and Phone Number:  Irizarry,Pearl-[phone]    Current Level of Care: Hospital Recommended Level of Care: [facility] Prior Approval Number:    Date Approved/Denied:   PASRR Number: Pending- will need to submit  Discharge Plan: SNF    Current Diagnoses: Patient Active Problem List   Diagnosis Date Noted  . Intraparenchymal hematoma of brain due to trauma ([place]) 06/17/2019  . Closed right hip fracture ([place]) 11/09/2018  . Sick sinus syndrome ([place]) 09/16/2018  . Bradycardia 05/14/2018  . Fall from standing 02/25/2017  . Parkinson disease ([place]) 01/27/2017  . Hyponatremia 01/27/2017  . Hypokalemia 01/27/2017  . Cellulitis of right hand 01/27/2017  . Normocytic anemia 01/27/2017  . Fall 09/16/2016  . SDH (subdural hematoma) ([place]) 09/16/2016  . Subdural hematoma ([place]) 09/05/2016  . Insomnia 08/13/2016  . On amiodarone therapy 08/06/2015  . Orthostatic hypotension 08/06/2015  . PAF (paroxysmal atrial fibrillation) ([place]) 08/06/2015  . Status post placement of implantable loop recorder 07/11/2015  . 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 Syncope 11/02/2014  . Awareness alteration, transient 11/02/2014  . OSA (obstructive sleep apnea) 05/31/2014  . Essential hypertension 09/23/2012  . Prostate cancer ([place]) 09/23/2012    Orientation RESPIRATION BLADDER Height & Weight     Self(follows commands)  Normal, Other (Comment)(Patient wears his own CPAP at night. Will need CPAP at  facility.) Incontinent Weight: 68.7 kg Height:  6' (182.9 cm)  BEHAVIORAL SYMPTOMS/MOOD NEUROLOGICAL BOWEL NUTRITION STATUS      Incontinent Diet(Regular diet.)  AMBULATORY STATUS COMMUNICATION OF NEEDS Skin   Extensive Assist Verbally Skin abrasions(Left heel and Bilateral arms.)                       Personal Care Assistance Level of Assistance  Bathing, Dressing, Feeding Bathing Assistance: Maximum assistance Feeding assistance: Limited assistance Dressing Assistance: Maximum assistance     Functional Limitations Info  Sight, Hearing Sight Info: Adequate Hearing Info: Adequate(dementia so slow to respond)      [place]  PT (By licensed PT), OT (By licensed OT)     PT Frequency: 5 x weekly OT Frequency: 5 x weekly            Contractures Contractures Info: Not present    Additional Factors Info  Code Status, Allergies Code Status Info: DNR Allergies Info: Aspirin, Augmentin, Penicillins,Hemocyte Plus hematinic Plus Vit-minerals   Insulin Sliding Scale Info: No insulin       Current Medications (07/01/2019):  This is the current hospital active medication list Current Facility-Administered Medications  Medication Dose Route Frequency Provider Last Rate Last Admin  . acetaminophen (TYLENOL) tablet 650 mg  650 mg Oral Q6H PRN Shela Leff, MD   650 mg at 06/29/19 0034   Or  . acetaminophen (TYLENOL) suppository 650 mg  650 mg Rectal Q6H PRN Shela Leff, MD      . ferrous sulfate tablet 325 mg  325 mg Oral TID WC Dessa Phi, DO   325 mg at 07/01/19 1254  .  loperamide (IMODIUM) capsule 2 mg  2 mg Oral PRN Marylyn Ishihara, Tyrone A, DO   2 mg at 06/30/19 1626  . loratadine (CLARITIN) tablet 10 mg  10 mg Oral QHS Dessa Phi, DO   10 mg at 06/30/19 2217  . Melatonin TABS 4.5 mg  4.5 mg Oral QHS Bodenheimer, Charles A, NP   4.5 mg at 06/30/19 2218  . midodrine (PROAMATINE) tablet 2.5 mg  2.5 mg Oral Daily Dessa Phi, DO   2.5 mg  at 07/01/19 0907  . nitrofurantoin (macrocrystal-monohydrate) (MACROBID) capsule 100 mg  100 mg Oral Q12H Kyle, Tyrone A, DO   100 mg at 07/01/19 0906  . pantoprazole (PROTONIX) EC tablet 40 mg  40 mg Oral Daily Dessa Phi, DO   40 mg at 07/01/19 [id number]  . pravastatin (PRAVACHOL) tablet 40 mg  40 mg Oral QHS Bodenheimer, Charles A, NP   40 mg at 06/30/19 2218  . psyllium (HYDROCIL/METAMUCIL) packet 1 packet  1 packet Oral BID Dessa Phi, DO   1 packet at 07/01/19 [phone]  . sodium chloride flush (NS) 0.9 % injection 3 mL  3 mL Intravenous Q12H Shela Leff, MD   3 mL at 07/01/19 0907  . sodium chloride tablet 1 g  1 g Oral Q breakfast Shela Leff, MD   1 g at 07/01/19 0907  . temazepam (RESTORIL) capsule 7.5 mg  7.5 mg Oral QHS PRN Arby Barrette A, NP   7.5 mg at 06/30/19 2226     Discharge Medications: Please see discharge summary for a list of discharge medications.  Relevant Imaging Results:  Relevant Lab Results:   Additional Information SS# [id number]  Bethena Roys, RN

## 2019-07-01 NOTE — Progress Notes (Signed)
Physical Therapy Treatment Patient Details Name: Joe Glover MRN: [id number] DOB: 10-Feb-1925 Today's Date: 07/01/2019    History of Present Illness 85 y.o. male with medical history significant of paroxysmal atrial fibrillation, sick sinus syndrome, orthostatic hypotension and recurrent syncope, chronic hyponatremia, Parkinson's disease with autonomic dysfunction, dementia, hyperlipidemia, anemia, OSA, prostate cancer, subdural hematoma, subarachnoid hemorrhage, CVA with residual dysarthria, and conditions listed below presenting to the ED for evaluation of syncope.    PT Comments    Patient received in bed having had incontinent BM, assisted nurse tech in cleaning and pericare with patient requiring MaxA for rolling. Once clean, able to perform supine to sit with ModAx1 but continues to demonstrate posterior lean requiring min guard-MinA to correct. Able to perform functional transfers with MinAx2 and RW, also able to take steps forward and backwards with RW and Min-ModAx2 before needing a sitting rest break. After resting able to transfer to chair with RW and Min-ModAx2 for safety due to weakness and gross unsteadiness. He was left up in the chair with all needs met, chair alarm active and spouse present, nursing staff aware of patient status and request for return to bed in 30-60 minutes. Continue to recommend SNF moving forward.     Follow Up Recommendations  SNF;Supervision/Assistance - 24 hour     Equipment Recommendations  Other (comment)(defer)    Recommendations for Other Services       Precautions / Restrictions Precautions Precautions: Fall Precaution Comments: h/o syncope Restrictions Weight Bearing Restrictions: No    Mobility  Bed Mobility Overal bed mobility: Independent Bed Mobility: Supine to Sit     Supine to sit: Mod assist     General bed mobility comments: ModA for LE management and to boost trunk to upright, mild posterior bias at EOB, able to correct  with VC and Min guard-MinA  Transfers Overall transfer level: Needs assistance Equipment used: Rolling walker (2 wheeled) Transfers: Sit to/from [facility] Sit to Stand: Min assist;Mod assist;+2 physical assistance Stand pivot transfers: Min assist;Mod assist;+2 physical assistance       General transfer comment: MinAx2 today to boost to full standing position and gait balance once standing, did require Min-ModAx2 for dynamic activity involved in STP to chair for safety  Ambulation/Gait Ambulation/Gait assistance: Min assist;+2 physical assistance Gait Distance (Feet): 2 Feet Assistive device: Rolling walker (2 wheeled) Gait Pattern/deviations: Step-through pattern;Shuffle;Decreased step length - left;Decreased step length - right Gait velocity: decreased   General Gait Details: able to take short steps 20f forward and 217fbackwards, then fatigued; also able to take pivotal steps to chair   Stairs             Wheelchair Mobility    Modified Rankin (Stroke Patients Only)       Balance Overall balance assessment: Needs assistance Sitting-balance support: Feet supported;Bilateral upper extremity supported Sitting balance-Leahy Scale: Fair Sitting balance - Comments: min guard-MinA for safety Postural control: Posterior lean Standing balance support: Bilateral upper extremity supported;During functional activity Standing balance-Leahy Scale: Poor Standing balance comment: reliant on RW and external support                            Cognition Arousal/Alertness: Awake/alert Behavior During Therapy: WFL for tasks assessed/performed Overall Cognitive Status: Difficult to assess  General Comments: difficult to assess due to mumbled and garbled speech, wife reports this is baseline and he also has hearing issues, seemed to follow commands better today/seemed less fatigued      Exercises       General Comments General comments (skin integrity, edema, etc.): VSS, feeling better than yesterday      Pertinent Vitals/Pain Pain Assessment: Faces Pain Score: 0-No pain Faces Pain Scale: No hurt Pain Intervention(s): Limited activity within patient's tolerance;Monitored during session    Home Living                      Prior Function            PT Goals (current goals can now be found in the care plan section) Acute Rehab PT Goals Patient Stated Goal: to improve BP management and reduce falls risk PT Goal Formulation: With patient/family Time For Goal Achievement: 07/11/19 Potential to Achieve Goals: Fair Progress towards PT goals: Progressing toward goals    Frequency    Min 3X/week      PT Plan Current plan remains appropriate    Co-evaluation PT/OT/SLP Co-Evaluation/Treatment: Yes Reason for Co-Treatment: For patient/therapist safety;To address functional/ADL transfers;Complexity of the patient's impairments (multi-system involvement) PT goals addressed during session: Mobility/safety with mobility;Balance;Proper use of DME        AM-PAC PT "6 Clicks" Mobility   Outcome Measure  Help needed turning from your back to your side while in a flat bed without using bedrails?: A Little Help needed moving from lying on your back to sitting on the side of a flat bed without using bedrails?: A Lot Help needed moving to and from a bed to a chair (including a wheelchair)?: A Lot Help needed standing up from a chair using your arms (e.g., wheelchair or bedside chair)?: A Lot Help needed to walk in hospital room?: A Lot Help needed climbing 3-5 steps with a railing? : Total 6 Click Score: 12    End of Session Equipment Utilized During Treatment: Gait belt Activity Tolerance: Patient limited by fatigue;Patient tolerated treatment well Patient left: in chair;with call bell/phone within reach;with chair alarm set;with family/visitor present Nurse  Communication: Mobility status;Need for lift equipment;Other (comment)(use of stedy for back to bed, timeframe for sitting up in chair given patient anxiety of being up) PT Visit Diagnosis: Muscle weakness (generalized) (M62.81);Dizziness and giddiness (R42)     Time: 5188-4166 PT Time Calculation (min) (ACUTE ONLY): 23 min  Charges:  $Therapeutic Activity: 8-22 mins                     Windell Norfolk, DPT, PN1   Supplemental Physical Therapist [place]    Pager [phone] Acute Rehab Office [phone]

## 2019-07-01 NOTE — Plan of Care (Signed)
  Problem: Nutrition: Goal: Adequate nutrition will be maintained Outcome: Progressing   Problem: Elimination: Goal: Will not experience complications related to urinary retention Outcome: Progressing   Problem: Pain Managment: Goal: General experience of comfort will improve Outcome: Progressing   Problem: Safety: Goal: Ability to remain free from injury will improve Outcome: Progressing   

## 2019-07-01 NOTE — Evaluation (Signed)
Occupational Therapy Evaluation Patient Details Name: Joe Glover MRN: [id number] DOB: May 16, 1925 Today's Date: 07/01/2019    History of Present Illness 85 y.o. male with medical history significant of paroxysmal atrial fibrillation, sick sinus syndrome, orthostatic hypotension and recurrent syncope, chronic hyponatremia, Parkinson's disease with autonomic dysfunction, dementia, hyperlipidemia, anemia, OSA, prostate cancer, subdural hematoma, subarachnoid hemorrhage, CVA with residual dysarthria, and conditions listed below presenting to the ED for evaluation of syncope.   Clinical Impression   Patient is a 85 year old male that lives with his spouse in a multi level home and has assist from spouse for all self care. Patient uses a walker at baseline with min guard to min A from spouse for functional ambulation, transfers. Currently patient has decreased safety awareness, strength, balance, activity tolerance requiring min A x2 to stand and up to mod A x2 for functional transfers due to posterior loss of balance. Recommend continued acute OT services to maximize patient safety and independence and reduce care giver burden for daily self care tasks.    Follow Up Recommendations  SNF;Supervision/Assistance - 24 hour    Equipment Recommendations  Other (comment)(defer to next venue of care)       Precautions / Restrictions Precautions Precautions: Fall Precaution Comments: h/o syncope Restrictions Weight Bearing Restrictions: No      Mobility Bed Mobility Overal bed mobility: Needs Assistance Bed Mobility: Supine to Sit     Supine to sit: Mod assist     General bed mobility comments: ModA for LE management and to boost trunk to upright, mild posterior bias at EOB, able to correct with VC and Min guard-MinA  Transfers Overall transfer level: Needs assistance Equipment used: Rolling walker (2 wheeled) Transfers: Sit to/from Stand Sit to Stand: Min assist;Mod assist;+2  physical assistance Stand pivot transfers: Min assist;Mod assist;+2 physical assistance       General transfer comment: MinAx2 today to boost to full standing position and gait balance once standing, did require Min-ModAx2 for dynamic activity for safety    Balance Overall balance assessment: Needs assistance Sitting-balance support: Feet supported;Bilateral upper extremity supported Sitting balance-Leahy Scale: Fair Sitting balance - Comments: min guard-MinA for safety Postural control: Posterior lean Standing balance support: Bilateral upper extremity supported;During functional activity Standing balance-Leahy Scale: Poor Standing balance comment: reliant on RW and external support                           ADL either performed or assessed with clinical judgement   ADL Overall ADL's : Needs assistance/impaired Eating/Feeding: Set up;Sitting   Grooming: Set up;Sitting   Upper Body Bathing: Set up;Sitting   Lower Body Bathing: Maximal assistance;Sitting/lateral leans Lower Body Bathing Details (indicate cue type and reason): spouse assists due to patient's poor standing balance Upper Body Dressing : Minimal assistance;Sitting   Lower Body Dressing: Maximal assistance;Sitting/lateral leans;Sit to/from stand Lower Body Dressing Details (indicate cue type and reason): spouse normally assists due to patient's poor standing balance Toilet Transfer: Minimal assistance;Moderate assistance;+2 for physical assistance;+2 for safety/equipment;BSC;RW Toilet Transfer Details (indicate cue type and reason): simulated chair transfer; min A x2 to stand mod A x2 due to loss of balance with initiating steps Toileting- Clothing Manipulation and Hygiene: Maximal assistance       Functional mobility during ADLs: Minimal assistance;Moderate assistance;+2 for safety/equipment;+2 for physical assistance;Rolling walker;Cueing for safety;Cueing for sequencing General ADL Comments: patient  requires assist from spouse at baseline for self care  Pertinent Vitals/Pain Pain Assessment: Faces Pain Score: 0-No pain Faces Pain Scale: No hurt Pain Intervention(s): Limited activity within patient's tolerance;Monitored during session     Hand Dominance Right   Extremity/Trunk Assessment Upper Extremity Assessment Upper Extremity Assessment: Generalized weakness   Lower Extremity Assessment Lower Extremity Assessment: Defer to PT evaluation   Cervical / Trunk Assessment Cervical / Trunk Assessment: Kyphotic   Communication Communication Communication: Other (comment)(h/o CVA; dysarthric speech)   Cognition Arousal/Alertness: Awake/alert Behavior During Therapy: WFL for tasks assessed/performed Overall Cognitive Status: Difficult to assess                                 General Comments: difficult to assess due to mumbled and garbled speech, wife reports this is baseline and he also has hearing issues,              Home Living Family/patient expects to be discharged to:: Private residence Living Arrangements: Spouse/significant other Available Help at Discharge: Family;Available 24 hours/day Type of Home: House Home Access: Level entry;Ramped entrance     Home Layout: Multi-level Alternate Level Stairs-Number of Steps: split level-8 up to bedroom Alternate Level Stairs-Rails: Right;Left Bathroom Shower/Tub: Teacher, early years/pre: Handicapped height Bathroom Accessibility: Yes How Accessible: Accessible via walker Home Equipment: Mildred - 2 wheels;Cane - single point;Bedside commode;Grab bars - tub/shower;Shower seat;Hand held shower head;Grab bars - toilet;Transport chair   Additional Comments: Has a RW and BSC on every level      Prior Functioning/Environment Level of Independence: Needs assistance  Gait / Transfers Assistance Needed: Pt amb with RW. Wife provides supervision to min assist. ADL's /  Homemaking Assistance Needed: wife assists with all ADLs.            OT Problem List: Decreased strength;Impaired balance (sitting and/or standing)      OT Treatment/Interventions: Self-care/ADL training;Therapeutic exercise;Therapeutic activities;Patient/family education;Balance training    OT Goals(Current goals can be found in the care plan section) Acute Rehab OT Goals Patient Stated Goal: to improve BP management and reduce falls risk OT Goal Formulation: With patient/family Time For Goal Achievement: 07/15/19 Potential to Achieve Goals: Good  OT Frequency: Min 2X/week           Co-evaluation PT/OT/SLP Co-Evaluation/Treatment: Yes Reason for Co-Treatment: Complexity of the patient's impairments (multi-system involvement);For patient/therapist safety;To address functional/ADL transfers PT goals addressed during session: Mobility/safety with mobility;Balance;Proper use of DME OT goals addressed during session: ADL's and self-care      AM-PAC OT "6 Clicks" Daily Activity     Outcome Measure Help from another person eating meals?: A Little Help from another person taking care of personal grooming?: A Little Help from another person toileting, which includes using toliet, bedpan, or urinal?: A Lot Help from another person bathing (including washing, rinsing, drying)?: A Lot Help from another person to put on and taking off regular upper body clothing?: A Little Help from another person to put on and taking off regular lower body clothing?: Total 6 Click Score: 14   End of Session Equipment Utilized During Treatment: Gait belt;Rolling walker Nurse Communication: Mobility status  Activity Tolerance: Patient tolerated treatment well Patient left: in chair;with call bell/phone within reach;with family/visitor present  OT Visit Diagnosis: Unsteadiness on feet (R26.81);Other abnormalities of gait and mobility (R26.89);Repeated falls (R29.6);History of falling (Z91.81)                 [id number] OT Time Calculation (  min): 27 min Charges:  OT General Charges $OT Visit: 1 Visit OT Evaluation $OT Eval Moderate Complexity: [place] OT OT office: [place] 07/01/2019, 1:53 PM

## 2019-07-02 LAB — CBC WITH DIFFERENTIAL/PLATELET
Abs Immature Granulocytes: 0.02 10*3/uL (ref 0.00–0.07)
Basophils Absolute: 0 10*3/uL (ref 0.0–0.1)
Basophils Relative: 1 %
Eosinophils Absolute: 0.2 10*3/uL (ref 0.0–0.5)
Eosinophils Relative: 5 %
HCT: 31.6 % — ABNORMAL LOW (ref 39.0–52.0)
Hemoglobin: 10.8 g/dL — ABNORMAL LOW (ref 13.0–17.0)
Immature Granulocytes: 1 %
Lymphocytes Relative: 20 %
Lymphs Abs: 0.7 10*3/uL (ref 0.7–4.0)
MCH: 27.8 pg (ref 26.0–34.0)
MCHC: 34.2 g/dL (ref 30.0–36.0)
MCV: 81.4 fL (ref 80.0–100.0)
Monocytes Absolute: 0.4 10*3/uL (ref 0.1–1.0)
Monocytes Relative: 10 %
Neutro Abs: 2.3 10*3/uL (ref 1.7–7.7)
Neutrophils Relative %: 63 %
Platelets: 219 10*3/uL (ref 150–400)
RBC: 3.88 MIL/uL — ABNORMAL LOW (ref 4.22–5.81)
RDW: 14.6 % (ref 11.5–15.5)
WBC: 3.6 10*3/uL — ABNORMAL LOW (ref 4.0–10.5)
nRBC: 0 % (ref 0.0–0.2)

## 2019-07-02 LAB — RENAL FUNCTION PANEL
Albumin: 2.2 g/dL — ABNORMAL LOW (ref 3.5–5.0)
Anion gap: 9 (ref 5–15)
BUN: 16 mg/dL (ref 8–23)
CO2: 20 mmol/L — ABNORMAL LOW (ref 22–32)
Calcium: 7.8 mg/dL — ABNORMAL LOW (ref 8.9–10.3)
Chloride: 102 mmol/L (ref 98–111)
Creatinine, Ser: 0.73 mg/dL (ref 0.61–1.24)
GFR calc Af Amer: >60 mL/min (ref >60)
GFR calc non Af Amer: >60 mL/min (ref >60)
Glucose, Bld: 100 mg/dL — ABNORMAL HIGH (ref 70–99)
Phosphorus: 3.6 mg/dL (ref 2.5–4.6)
Potassium: 3.5 mmol/L (ref 3.5–5.1)
Sodium: 131 mmol/L — ABNORMAL LOW (ref 135–145)

## 2019-07-02 LAB — GLUCOSE, CAPILLARY: Glucose-Capillary: 101 mg/dL — ABNORMAL HIGH (ref 70–99)

## 2019-07-02 LAB — MAGNESIUM: Magnesium: 1.4 mg/dL — ABNORMAL LOW (ref 1.7–2.4)

## 2019-07-02 MED ORDER — MAGNESIUM OXIDE 400 (241.3 MG) MG PO TABS
800.0000 mg | ORAL_TABLET | Freq: Two times a day (BID) | ORAL | Status: DC
  Administered 2019-07-02 – 2019-07-05 (×6): 800 mg via ORAL
  Filled 2019-07-02 (×6): qty 2

## 2019-07-02 MED ORDER — POTASSIUM CHLORIDE CRYS ER 20 MEQ PO TBCR
20.0000 meq | EXTENDED_RELEASE_TABLET | Freq: Every day | ORAL | Status: DC
  Administered 2019-07-02: 20 meq via ORAL
  Filled 2019-07-02: qty 1

## 2019-07-02 NOTE — Progress Notes (Signed)
[name]  PROGRESS NOTE    Joe Glover  [id number] DOB: [birth year] DOA: 06/25/2019 PCP: [name], MD   Brief Narrative:   Joe Hoffmanis a 85 y.o.malewith medical history significant ofparoxysmal atrial fibrillation, sick sinus syndrome, orthostatic hypotension and recurrent syncope, chronic hyponatremia, Parkinson's disease with autonomic dysfunction, dementia,hyperlipidemia, anemia, OSA, prostate cancer, subdural hematoma, subarachnoid hemorrhage, CVAwith residual dysarthria, and conditions listed below presenting to the ED for evaluation of syncope. Systolic was 70 upon EMS arrival. He was given fluid bolus.Patient did not fall or sustain any injuries to his head.   He was seen earlier same day in the ED for the same problem and his systolic was in the 0000000 at that time. Head CT showing stable previously demonstrated subdural hematoma along the posterior aspect of the interhemispheric falx and tentorium on the right. However, a new component of layering over the right frontoparietal convexity, measuring up to 8 mm in thickness. The small foci of acute intraparenchymal hemorrhage in the left frontal lobe appeared improved. No other new areas of acute intracranial hemorrhage. 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 ED provider had discussed the case with Dr. Trenton Gammon from neurosurgery who felt that this new finding on CT was redistribution of old blood loss and not a new bleed. He did not feel that any neurosurgical intervention was necessary. Hospital admission was offered at that time but patient and his wife declined.   Patient was recently admitted 12/10-12/11 for syncope resulting in a fall and head injury. CT head revealed subdural hematoma andintraparenchymal hematoma.  Patient is now admitted for recurrent orthostatic hypotension and syncope.  07/02/19: No acute events ON. Working on SNF placement. Increase magnesium.     Assessment & Plan:   Principal Problem:   Syncope Active Problems:   OSA (obstructive sleep apnea)   Hyponatremia   PAF (paroxysmal atrial fibrillation) (HCC)   Subdural hematoma (HCC)  Recurrent syncope with orthostatic hypotension severe autonomic dysfunction from Parkinson's disease - Follows with Dr. [name], cardiology, as an outpatient - positive orthostatic - midodrine daily; BPs better     - BP is ok, monitor  UTI - UA was unremarkable but UCx growing e coli that is pansensative - cipro started; transitioned to macrobid; doing well     - stable, doing well   History of subdural hematoma and intraparenchymal hemorrhage - Patient recently admitted 12/10 for syncope resulting in fall and head injury; head CT revealed subdural hematoma and intraparenchymal hematoma.  - Repeat Head CT showing stable previously demonstrated subdural hematoma along the posterior aspect of the interhemispheric falx and tentorium on the right.However, a new component of layering over the right frontoparietal convexity, measuring up to 8 mm in thickness. The small foci of acute intraparenchymal hemorrhage in the left frontal lobe appeared improved. No other new areas of acute intracranial hemorrhage. 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  - ED provider had discussed the case with Dr. Trenton Gammon from neurosurgery who felt that this new finding on CT was redistribution of old blood loss and not a new bleed.He did not feel that any neurosurgical intervention was necessary - Hold off on any antiplatelet/anticoagulants   Paroxysmal atrial fibrillation - Currently in sinus rhythm.  - Not on any rate controlling medications.  - Not a candidate for anticoagulation given advanced age, history of falls, GI bleed, and subdural hematoma/intraparenchymal hemorrhage  Chronic hyponatremia - Sodium ranging between 127-130  during recent hospitalization - Continue home sodium  chloride tablets     - Na+ improved, monitor     - 07/02/19: he is stable, monitor  Physical deconditioning - PT evaluation recommending SNF; working on options  OSA - Continue CPAP  Parkinson's disease with dementia  - Has some mild cognitive impairment   Poor PO intake - encourage diet  Normocytic anemia - no evidence of frank bleed - he is stable, monitor  Hypomagnesemia     - increase, monitor  DVT prophylaxis: SCDs Code Status: DNR Family Communication: With wife at bedside   Disposition Plan: To SNF when bed is available  Antimicrobials:  [name] Kitchen Macrobid   Subjective: Denies complaints.   Objective: Vitals:   07/01/19 0542 07/01/19 2052 07/02/19 0333 07/02/19 0435  BP: 112/81 121/78    Pulse: 85 91    Resp:      Temp: 98.4 F (36.9 C) 98.2 F (36.8 C)  97.6 F (36.4 C)  TempSrc: Oral Oral  Oral  SpO2: 100% 96%    Weight:   69.2 kg   Height:        Intake/Output Summary (Last 24 hours) at 07/02/2019 1514 Last data filed at 07/01/2019 2328 Gross per 24 hour  Intake 3 ml  Output 500 ml  Net -497 ml   Filed Weights   06/29/19 0410 06/30/19 0552 07/02/19 0333  Weight: 67.7 kg 68.7 kg 69.2 kg    Examination:  General: 85 y.o. male resting in bed in NAD Cardiovascular: RRR, +S1, S2, no m/g/r, equal pulses throughout Respiratory: CTABL, no w/r/r, normal WOB GI: BS+, NDNT, no masses noted, no organomegaly noted MSK: No e/c/c Neuro: alert to name, follows commands Psyc: calm/cooperative   Data Reviewed: I have personally reviewed following labs and imaging studies.  CBC: Recent Labs  Lab 06/25/19 2046 06/29/19 0351 06/30/19 0308 07/01/19 1016 07/02/19 0302  WBC 5.8 7.1 6.7 4.1 3.6*  NEUTROABS 4.1  --   --  2.9 2.3  HGB 13.7 12.0* 11.3* 11.0* 10.8*  HCT 41.8 34.9* 32.7* 32.9* 31.6*  MCV 85.3 81.2 81.8 82.7 81.4  PLT 221 166 195 216 [id number]   Basic  Metabolic Panel: Recent Labs  Lab 06/28/19 0413 06/29/19 0351 06/30/19 0308 07/01/19 1016 07/02/19 0302  NA 132* 129* 129* 132* 131*  K 3.6 3.1* 4.0 3.7 3.5  CL 100 99 101 102 102  CO2 21* 20* 19* 19* 20*  GLUCOSE 121* 112* 120* 124* 100*  BUN 14 17 20 18 16   CREATININE 0.86 0.93 0.82 1.17 0.73  CALCIUM 8.3* 8.3* 8.2* 8.1* 7.8*  MG  --   --   --  1.5* 1.4*  PHOS  --   --   --  3.0 3.6   GFR: Estimated Creatinine Clearance: 57.7 mL/min (by C-G formula based on SCr of 0.73 mg/dL). Liver Function Tests: Recent Labs  Lab 07/01/19 1016 07/02/19 0302  ALBUMIN 2.3* 2.2*   No results for input(s): LIPASE, AMYLASE in the last 168 hours. No results for input(s): AMMONIA in the last 168 hours. Coagulation Profile: No results for input(s): INR, PROTIME in the last 168 hours. Cardiac Enzymes: No results for input(s): CKTOTAL, CKMB, CKMBINDEX, TROPONINI in the last 168 hours. BNP (last 3 results) No results for input(s): PROBNP in the last 8760 hours. HbA1C: No results for input(s): HGBA1C in the last 72 hours. CBG: Recent Labs  Lab 06/25/19 2002 06/29/19 0412 06/30/19 0554 07/01/19 0540 07/02/19 0434  GLUCAP 73 103* 115* 109* 101*   Lipid Profile: No results for  input(s): CHOL, HDL, LDLCALC, TRIG, CHOLHDL, LDLDIRECT in the last 72 hours. Thyroid Function Tests: No results for input(s): TSH, T4TOTAL, FREET4, T3FREE, THYROIDAB in the last 72 hours. Anemia Panel: No results for input(s): VITAMINB12, FOLATE, FERRITIN, TIBC, IRON, RETICCTPCT in the last 72 hours. Sepsis Labs: No results for input(s): PROCALCITON, LATICACIDVEN in the last 168 hours.  Recent Results (from the past 240 hour(s))  SARS CORONAVIRUS 2 (TAT 6-24 HRS) Nasopharyngeal Nasopharyngeal Swab     Status: None   Collection Time: 06/25/19  8:46 PM   Specimen: Nasopharyngeal Swab  Result Value Ref Range Status   SARS Coronavirus 2 NEGATIVE NEGATIVE Final    Comment: (NOTE) SARS-CoV-2 target nucleic acids  are NOT DETECTED. The SARS-CoV-2 RNA is generally detectable in upper and lower respiratory specimens during the acute phase of infection. Negative results do not preclude SARS-CoV-2 infection, do not rule out co-infections with other pathogens, and should not be used as the sole basis for treatment or other patient management decisions. Negative results must be combined with clinical observations, patient history, and epidemiological information. The expected result is Negative. Fact Sheet for Patients: SugarRoll.be Fact Sheet for Healthcare Providers: https://www.woods-mathews.com/ This test is not yet approved or cleared by the [place] FDA and  has been authorized for detection and/or diagnosis of SARS-CoV-2 by FDA under an Emergency Use Authorization (EUA). This EUA will remain  in effect (meaning this test can be used) for the duration of the COVID-19 declaration under Section 56 4(b)(1) of the Act, 21 U.S.C. section 360bbb-3(b)(1), unless the authorization is terminated or revoked sooner. Performed at [facility], [place] [address], [place], [place] [address]   Culture, blood (routine x 2)     Status: None (Preliminary result)   Collection Time: 06/28/19  7:50 AM   Specimen: BLOOD LEFT HAND  Result Value Ref Range Status   Specimen Description BLOOD LEFT HAND  Final   Special Requests   Final    BOTTLES DRAWN AEROBIC ONLY Blood Culture results may not be optimal due to an inadequate volume of blood received in culture bottles   Culture   Final    NO GROWTH 4 DAYS Performed at [facility], [place] [address], [place], [place] [address]    Report Status PENDING  Incomplete  Culture, blood (routine x 2)     Status: None (Preliminary result)   Collection Time: 06/28/19  7:55 AM   Specimen: BLOOD LEFT HAND  Result Value Ref Range Status   Specimen Description BLOOD LEFT HAND  Final   Special Requests   Final    BOTTLES  DRAWN AEROBIC ONLY Blood Culture adequate volume   Culture   Final    NO GROWTH 4 DAYS Performed at [facility], [place] [address], [place], [place] [address]    Report Status PENDING  Incomplete  Culture, Urine     Status: Abnormal   Collection Time: 06/28/19  3:15 PM   Specimen: Urine, Random  Result Value Ref Range Status   Specimen Description URINE, RANDOM  Final   Special Requests   Final    NONE Performed at [facility], [place] [address] [place], [place] [address]    Culture 80,000 COLONIES/mL ESCHERICHIA COLI (A)  Final   Report Status 06/30/2019 FINAL  Final   Organism ID, Bacteria ESCHERICHIA COLI (A)  Final      Susceptibility   Escherichia coli - MIC*    AMPICILLIN >=32 RESISTANT Resistant  CEFAZOLIN <=4 SENSITIVE Sensitive     CEFTRIAXONE <=1 SENSITIVE Sensitive     CIPROFLOXACIN 1 SENSITIVE Sensitive     GENTAMICIN >=16 RESISTANT Resistant     IMIPENEM <=0.25 SENSITIVE Sensitive     NITROFURANTOIN <=16 SENSITIVE Sensitive     TRIMETH/SULFA >=320 RESISTANT Resistant     AMPICILLIN/SULBACTAM >=32 RESISTANT Resistant     PIP/TAZO <=4 SENSITIVE Sensitive     * 80,000 COLONIES/mL ESCHERICHIA COLI  Respiratory Panel by PCR     Status: None   Collection Time: 06/29/19 10:27 AM   Specimen: Nasopharyngeal Swab; Respiratory  Result Value Ref Range Status   Adenovirus NOT DETECTED NOT DETECTED Final   Coronavirus 229E NOT DETECTED NOT DETECTED Final    Comment: (NOTE) The Coronavirus on the Respiratory Panel, DOES NOT test for the novel  Coronavirus (2019 nCoV)    Coronavirus HKU1 NOT DETECTED NOT DETECTED Final   Coronavirus NL63 NOT DETECTED NOT DETECTED Final   Coronavirus OC43 NOT DETECTED NOT DETECTED Final   Metapneumovirus NOT DETECTED NOT DETECTED Final   Rhinovirus / Enterovirus NOT DETECTED NOT DETECTED Final   Influenza A NOT DETECTED NOT DETECTED Final   Influenza B NOT DETECTED NOT DETECTED Final   Parainfluenza Virus 1 NOT DETECTED NOT  DETECTED Final   Parainfluenza Virus 2 NOT DETECTED NOT DETECTED Final   Parainfluenza Virus 3 NOT DETECTED NOT DETECTED Final   Parainfluenza Virus 4 NOT DETECTED NOT DETECTED Final   Respiratory Syncytial Virus NOT DETECTED NOT DETECTED Final   Bordetella pertussis NOT DETECTED NOT DETECTED Final   Chlamydophila pneumoniae NOT DETECTED NOT DETECTED Final   Mycoplasma pneumoniae NOT DETECTED NOT DETECTED Final    Comment: Performed at [facility], [place] [address] [place], [place] [address]      Radiology Studies: No results found.   Scheduled Meds: . ferrous sulfate  325 mg Oral TID WC  . loratadine  10 mg Oral QHS  . magnesium oxide  800 mg Oral BID  . Melatonin  4.5 mg Oral QHS  . midodrine  2.5 mg Oral Daily  . nitrofurantoin (macrocrystal-monohydrate)  100 mg Oral Q12H  . pantoprazole  40 mg Oral Daily  . potassium chloride  20 mEq Oral Daily  . pravastatin  40 mg Oral QHS  . psyllium  1 packet Oral BID  . sodium chloride flush  3 mL Intravenous Q12H  . sodium chloride  1 g Oral Q breakfast   Continuous Infusions:   LOS: 6 days    Time spent: 25 minutes spent in the coordination of care today.    Jonnie Finner, DO [facility]  If 7PM-7AM, please contact night-coverage [email or web address] 07/02/2019, 3:14 PM

## 2019-07-03 LAB — CBC WITH DIFFERENTIAL/PLATELET
Abs Immature Granulocytes: 0.01 10*3/uL (ref 0.00–0.07)
Basophils Absolute: 0 10*3/uL (ref 0.0–0.1)
Basophils Relative: 1 %
Eosinophils Absolute: 0.3 10*3/uL (ref 0.0–0.5)
Eosinophils Relative: 8 %
HCT: 33.7 % — ABNORMAL LOW (ref 39.0–52.0)
Hemoglobin: 11.5 g/dL — ABNORMAL LOW (ref 13.0–17.0)
Immature Granulocytes: 0 %
Lymphocytes Relative: 22 %
Lymphs Abs: 0.8 10*3/uL (ref 0.7–4.0)
MCH: 28.2 pg (ref 26.0–34.0)
MCHC: 34.1 g/dL (ref 30.0–36.0)
MCV: 82.6 fL (ref 80.0–100.0)
Monocytes Absolute: 0.5 10*3/uL (ref 0.1–1.0)
Monocytes Relative: 13 %
Neutro Abs: 2 10*3/uL (ref 1.7–7.7)
Neutrophils Relative %: 56 %
Platelets: 253 10*3/uL (ref 150–400)
RBC: 4.08 MIL/uL — ABNORMAL LOW (ref 4.22–5.81)
RDW: 14.5 % (ref 11.5–15.5)
WBC: 3.6 10*3/uL — ABNORMAL LOW (ref 4.0–10.5)
nRBC: 0 % (ref 0.0–0.2)

## 2019-07-03 LAB — RENAL FUNCTION PANEL
Albumin: 2.3 g/dL — ABNORMAL LOW (ref 3.5–5.0)
Anion gap: 8 (ref 5–15)
BUN: 10 mg/dL (ref 8–23)
CO2: 23 mmol/L (ref 22–32)
Calcium: 7.9 mg/dL — ABNORMAL LOW (ref 8.9–10.3)
Chloride: 102 mmol/L (ref 98–111)
Creatinine, Ser: 0.65 mg/dL (ref 0.61–1.24)
GFR calc Af Amer: >60 mL/min (ref >60)
GFR calc non Af Amer: >60 mL/min (ref >60)
Glucose, Bld: 104 mg/dL — ABNORMAL HIGH (ref 70–99)
Phosphorus: 3.3 mg/dL (ref 2.5–4.6)
Potassium: 3.4 mmol/L — ABNORMAL LOW (ref 3.5–5.1)
Sodium: 133 mmol/L — ABNORMAL LOW (ref 135–145)

## 2019-07-03 LAB — CULTURE, BLOOD (ROUTINE X 2)
Culture: NO GROWTH
Culture: NO GROWTH
Special Requests: ADEQUATE

## 2019-07-03 LAB — MAGNESIUM: Magnesium: 1.6 mg/dL — ABNORMAL LOW (ref 1.7–2.4)

## 2019-07-03 LAB — GLUCOSE, CAPILLARY: Glucose-Capillary: 96 mg/dL (ref 70–99)

## 2019-07-03 MED ORDER — POTASSIUM CHLORIDE CRYS ER 20 MEQ PO TBCR
40.0000 meq | EXTENDED_RELEASE_TABLET | Freq: Every day | ORAL | Status: DC
  Administered 2019-07-03 – 2019-07-04 (×2): 40 meq via ORAL
  Filled 2019-07-03 (×2): qty 2

## 2019-07-03 NOTE — Progress Notes (Signed)
[name]  PROGRESS NOTE    Kyshaun Foringer  [id number] DOB: 1924-09-02 DOA: 06/25/2019 PCP: [name], MD   Brief Narrative:   Neilan Hoffmanis a 85 y.o.malewith medical history significant ofparoxysmal atrial fibrillation, sick sinus syndrome, orthostatic hypotension and recurrent syncope, chronic hyponatremia, Parkinson's disease with autonomic dysfunction, dementia,hyperlipidemia, anemia, OSA, prostate cancer, subdural hematoma, subarachnoid hemorrhage, CVAwith residual dysarthria, and conditions listed below presenting to the ED for evaluation of syncope. Systolic was 70 upon EMS arrival. He was given fluid bolus.Patient did not fall or sustain any injuries to his head.   He was seen earlier same day in the ED for the same problem and his systolic was in the 0000000 at that time. Head CT showing stable previously demonstrated subdural hematoma along the posterior aspect of the interhemispheric falx and tentorium on the right. However, a new component of layering over the right frontoparietal convexity, measuring up to 8 mm in thickness. The small foci of acute intraparenchymal hemorrhage in the left frontal lobe appeared improved. No other new areas of acute intracranial hemorrhage. 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 ED provider had discussed the case with Dr. Trenton Gammon from neurosurgery who felt that this new finding on CT was redistribution of old blood loss and not a new bleed. He did not feel that any neurosurgical intervention was necessary. Hospital admission was offered at that time but patient and his wife declined.   Patient was recently admitted 12/10-12/11 for syncope resulting in a fall and head injury. CT head revealed subdural hematoma andintraparenchymal hematoma.  Patient is now admitted for recurrent orthostatic hypotension and syncope.  07/03/19:Looks good this AM. Awaiting SNF bed offer. Replete Mg2+/K+.    Assessment &  Plan:   Principal Problem:   Syncope Active Problems:   OSA (obstructive sleep apnea)   Hyponatremia   PAF (paroxysmal atrial fibrillation) (HCC)   Subdural hematoma (HCC)  Recurrent syncope with orthostatic hypotension severe autonomic dysfunction from Parkinson's disease - Follows with Dr. [name], cardiology, as an outpatient - positive orthostatic - midodrine daily; BPs better     - BP is ok, monitor  UTI - UA was unremarkable but UCx growing e coli that is pansensative - cipro started; transitionedto macrobid; doing well     - stable, doing well     - macrobid through 12/30  History of subdural hematoma and intraparenchymal hemorrhage - Patient recently admitted 12/10 for syncope resulting in fall and head injury; head CT revealed subdural hematoma and intraparenchymal hematoma.  - Repeat Head CT showing stable previously demonstrated subdural hematoma along the posterior aspect of the interhemispheric falx and tentorium on the right.However, a new component of layering over the right frontoparietal convexity, measuring up to 8 mm in thickness. The small foci of acute intraparenchymal hemorrhage in the left frontal lobe appeared improved. No other new areas of acute intracranial hemorrhage. 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  - ED provider had discussed the case with Dr. Trenton Gammon from neurosurgery who felt that this new finding on CT was redistribution of old blood loss and not a new bleed.He did not feel that any neurosurgical intervention was necessary - Hold off on any antiplatelet/anticoagulants   Paroxysmal atrial fibrillation - Currently in sinus rhythm.  - Not on any rate controlling medications.  - Not a candidate for anticoagulation given advanced age, history of falls, GI bleed, and subdural hematoma/intraparenchymal hemorrhage  Chronic hyponatremia - Sodium ranging between  127-130 during recent  hospitalization - Continue home sodium chloride tablets - Na+ improved, monitor     - asymptomatic, continue as above  Physical deconditioning Debility - PT evaluation recommending SNF; working on options  OSA - Continue CPAP  Parkinson's disease with dementia  - Has some mild cognitive impairment   Poor PO intake - encourage diet  Normocytic anemia - no evidence of frank bleed - he is stable, monitor  Hypomagnesemia Hypokalemia - replete, monitor  DVT prophylaxis: SCDs Code Status: DNR Family Communication: With wife bedside   Disposition Plan: To SNF when bed available  Antimicrobials:  . macrobid (END 12/30)   ROS:  Denies CP, dyspnea, N/V, ab pain . Remainder 10-pt ROS is negative for all not previously mentioned.  Subjective: "I'm doing ok."  Objective: Vitals:   07/02/19 1603 07/02/19 2001 07/02/19 2348 07/03/19 0245  BP: (!) 131/94 (!) 168/93 (!) 135/95 132/89  Pulse: 78 92    Resp: (!) 21 18 20 20   Temp: 98 F (36.7 C) 98 F (36.7 C) 98.1 F (36.7 C) 98.4 F (36.9 C)  TempSrc: Oral Oral Oral Axillary  SpO2: 97% 97% 97% 96%  Weight:    68.1 kg  Height:        Intake/Output Summary (Last 24 hours) at 07/03/2019 1313 Last data filed at 07/03/2019 1022 Gross per 24 hour  Intake 123 ml  Output 1625 ml  Net -1502 ml   Filed Weights   06/30/19 0552 07/02/19 0333 07/03/19 0245  Weight: 68.7 kg 69.2 kg 68.1 kg    Examination:  General: 85 y.o. male resting in bed in NAD Cardiovascular: RRR, +S1, S2, no m/g/r Respiratory: CTABL, no w/r/r GI: BS+, NDNT, soft MSK: No e/c/c Neuro: alert to name, follows commands  Data Reviewed: I have personally reviewed following labs and imaging studies.  CBC: Recent Labs  Lab 06/29/19 0351 06/30/19 0308 07/01/19 1016 07/02/19 0302 07/03/19 0541  WBC 7.1 6.7 4.1 3.6* 3.6*  NEUTROABS  --   --  2.9 2.3 2.0  HGB 12.0* 11.3* 11.0* 10.8*  11.5*  HCT 34.9* 32.7* 32.9* 31.6* 33.7*  MCV 81.2 81.8 82.7 81.4 82.6  PLT 166 195 216 219 [id number]   Basic Metabolic Panel: Recent Labs  Lab 06/29/19 0351 06/30/19 0308 07/01/19 1016 07/02/19 0302 07/03/19 0541  NA 129* 129* 132* 131* 133*  K 3.1* 4.0 3.7 3.5 3.4*  CL 99 101 102 102 102  CO2 20* 19* 19* 20* 23  GLUCOSE 112* 120* 124* 100* 104*  BUN 17 20 18 16 10   CREATININE 0.93 0.82 1.17 0.73 0.65  CALCIUM 8.3* 8.2* 8.1* 7.8* 7.9*  MG  --   --  1.5* 1.4* 1.6*  PHOS  --   --  3.0 3.6 3.3   GFR: Estimated Creatinine Clearance: 56.8 mL/min (by C-G formula based on SCr of 0.65 mg/dL). Liver Function Tests: Recent Labs  Lab 07/01/19 1016 07/02/19 0302 07/03/19 0541  ALBUMIN 2.3* 2.2* 2.3*   No results for input(s): LIPASE, AMYLASE in the last 168 hours. No results for input(s): AMMONIA in the last 168 hours. Coagulation Profile: No results for input(s): INR, PROTIME in the last 168 hours. Cardiac Enzymes: No results for input(s): CKTOTAL, CKMB, CKMBINDEX, TROPONINI in the last 168 hours. BNP (last 3 results) No results for input(s): PROBNP in the last 8760 hours. HbA1C: No results for input(s): HGBA1C in the last 72 hours. CBG: Recent Labs  Lab 06/29/19 0412 06/30/19 0554 07/01/19 0540 07/02/19 0434 07/03/19 0530  GLUCAP 103*  115* 109* 101* 96   Lipid Profile: No results for input(s): CHOL, HDL, LDLCALC, TRIG, CHOLHDL, LDLDIRECT in the last 72 hours. Thyroid Function Tests: No results for input(s): TSH, T4TOTAL, FREET4, T3FREE, THYROIDAB in the last 72 hours. Anemia Panel: No results for input(s): VITAMINB12, FOLATE, FERRITIN, TIBC, IRON, RETICCTPCT in the last 72 hours. Sepsis Labs: No results for input(s): PROCALCITON, LATICACIDVEN in the last 168 hours.  Recent Results (from the past 240 hour(s))  SARS CORONAVIRUS 2 (TAT 6-24 HRS) Nasopharyngeal Nasopharyngeal Swab     Status: None   Collection Time: 06/25/19  8:46 PM   Specimen: Nasopharyngeal Swab    Result Value Ref Range Status   SARS Coronavirus 2 NEGATIVE NEGATIVE Final    Comment: (NOTE) SARS-CoV-2 target nucleic acids are NOT DETECTED. The SARS-CoV-2 RNA is generally detectable in upper and lower respiratory specimens during the acute phase of infection. Negative results do not preclude SARS-CoV-2 infection, do not rule out co-infections with other pathogens, and should not be used as the sole basis for treatment or other patient management decisions. Negative results must be combined with clinical observations, patient history, and epidemiological information. The expected result is Negative. Fact Sheet for Patients: SugarRoll.be Fact Sheet for Healthcare Providers: https://www.woods-mathews.com/ This test is not yet approved or cleared by the [place] FDA and  has been authorized for detection and/or diagnosis of SARS-CoV-2 by FDA under an Emergency Use Authorization (EUA). This EUA will remain  in effect (meaning this test can be used) for the duration of the COVID-19 declaration under Section 56 4(b)(1) of the Act, 21 U.S.C. section 360bbb-3(b)(1), unless the authorization is terminated or revoked sooner. Performed at [facility], [place] [address], [place], [place] [address]   Culture, blood (routine x 2)     Status: None   Collection Time: 06/28/19  7:50 AM   Specimen: BLOOD LEFT HAND  Result Value Ref Range Status   Specimen Description BLOOD LEFT HAND  Final   Special Requests   Final    BOTTLES DRAWN AEROBIC ONLY Blood Culture results may not be optimal due to an inadequate volume of blood received in culture bottles   Culture   Final    NO GROWTH 5 DAYS Performed at [facility], [place] [address], [place], [place] [address]    Report Status 07/03/2019 FINAL  Final  Culture, blood (routine x 2)     Status: None   Collection Time: 06/28/19  7:55 AM   Specimen: BLOOD LEFT HAND  Result Value Ref Range  Status   Specimen Description BLOOD LEFT HAND  Final   Special Requests   Final    BOTTLES DRAWN AEROBIC ONLY Blood Culture adequate volume   Culture   Final    NO GROWTH 5 DAYS Performed at [facility], [place] [address] [place], [place] [address]    Report Status 07/03/2019 FINAL  Final  Culture, Urine     Status: Abnormal   Collection Time: 06/28/19  3:15 PM   Specimen: Urine, Random  Result Value Ref Range Status   Specimen Description URINE, RANDOM  Final   Special Requests   Final    NONE Performed at [facility], [place] [address] [place], [place] [address]    Culture 80,000 COLONIES/mL ESCHERICHIA COLI (A)  Final   Report Status 06/30/2019 FINAL  Final   Organism ID, Bacteria ESCHERICHIA COLI (A)  Final      Susceptibility   Escherichia coli -  MIC*    AMPICILLIN >=32 RESISTANT Resistant     CEFAZOLIN <=4 SENSITIVE Sensitive     CEFTRIAXONE <=1 SENSITIVE Sensitive     CIPROFLOXACIN 1 SENSITIVE Sensitive     GENTAMICIN >=16 RESISTANT Resistant     IMIPENEM <=0.25 SENSITIVE Sensitive     NITROFURANTOIN <=16 SENSITIVE Sensitive     TRIMETH/SULFA >=320 RESISTANT Resistant     AMPICILLIN/SULBACTAM >=32 RESISTANT Resistant     PIP/TAZO <=4 SENSITIVE Sensitive     * 80,000 COLONIES/mL ESCHERICHIA COLI  Respiratory Panel by PCR     Status: None   Collection Time: 06/29/19 10:27 AM   Specimen: Nasopharyngeal Swab; Respiratory  Result Value Ref Range Status   Adenovirus NOT DETECTED NOT DETECTED Final   Coronavirus 229E NOT DETECTED NOT DETECTED Final    Comment: (NOTE) The Coronavirus on the Respiratory Panel, DOES NOT test for the novel  Coronavirus (2019 nCoV)    Coronavirus HKU1 NOT DETECTED NOT DETECTED Final   Coronavirus NL63 NOT DETECTED NOT DETECTED Final   Coronavirus OC43 NOT DETECTED NOT DETECTED Final   Metapneumovirus NOT DETECTED NOT DETECTED Final   Rhinovirus / Enterovirus NOT DETECTED NOT DETECTED Final   Influenza A NOT DETECTED NOT DETECTED  Final   Influenza B NOT DETECTED NOT DETECTED Final   Parainfluenza Virus 1 NOT DETECTED NOT DETECTED Final   Parainfluenza Virus 2 NOT DETECTED NOT DETECTED Final   Parainfluenza Virus 3 NOT DETECTED NOT DETECTED Final   Parainfluenza Virus 4 NOT DETECTED NOT DETECTED Final   Respiratory Syncytial Virus NOT DETECTED NOT DETECTED Final   Bordetella pertussis NOT DETECTED NOT DETECTED Final   Chlamydophila pneumoniae NOT DETECTED NOT DETECTED Final   Mycoplasma pneumoniae NOT DETECTED NOT DETECTED Final    Comment: Performed at [facility], [place]. [address] [place], [place] [address]      Radiology Studies: No results found.   Scheduled Meds: . ferrous sulfate  325 mg Oral TID WC  . loratadine  10 mg Oral QHS  . magnesium oxide  800 mg Oral BID  . Melatonin  4.5 mg Oral QHS  . midodrine  2.5 mg Oral Daily  . nitrofurantoin (macrocrystal-monohydrate)  100 mg Oral Q12H  . pantoprazole  40 mg Oral Daily  . potassium chloride  40 mEq Oral Daily  . pravastatin  40 mg Oral QHS  . psyllium  1 packet Oral BID  . sodium chloride flush  3 mL Intravenous Q12H  . sodium chloride  1 g Oral Q breakfast   Continuous Infusions:   LOS: 7 days    Time spent: 25 minutes spent in the coordination of care today.    Jonnie Finner, DO [facility]  If 7PM-7AM, please contact night-coverage [email or web address] 07/03/2019, 1:13 PM

## 2019-07-03 NOTE — Progress Notes (Signed)
Pts wife will place pt on his home CPAP when ready. RT offered assistance and pt wife declined stating she can do it without issues. RT will continue to monitor.

## 2019-07-03 NOTE — Progress Notes (Signed)
Pts wife puts pt on his home CPAP when Pt is ready to go on. RT offered assistance and wife declined stating they are good. RT will continue to monitor.

## 2019-07-04 LAB — SARS CORONAVIRUS 2 (TAT 6-24 HRS): SARS Coronavirus 2: NEGATIVE

## 2019-07-04 LAB — GLUCOSE, CAPILLARY: Glucose-Capillary: 104 mg/dL — ABNORMAL HIGH (ref 70–99)

## 2019-07-04 NOTE — Progress Notes (Signed)
[name] Kitchen  PROGRESS NOTE    Joe Glover  [id number] DOB: 1924-12-26 DOA: 06/25/2019 PCP: [name], MD   Brief Narrative:   Joe Glover a 85 y.o.malewith medical history significant ofparoxysmal atrial fibrillation, sick sinus syndrome, orthostatic hypotension and recurrent syncope, chronic hyponatremia, Parkinson's disease with autonomic dysfunction, dementia,hyperlipidemia, anemia, OSA, prostate cancer, subdural hematoma, subarachnoid hemorrhage, CVAwith residual dysarthria, and conditions listed below presenting to the ED for evaluation of syncope. Systolic was 70 upon EMS arrival. He was given fluid bolus.Patient did not fall or sustain any injuries to his head.   He was seen earlier same day in the ED for the same problem and his systolic was in the 0000000 at that time. Head CT showing stable previously demonstrated subdural hematoma along the posterior aspect of the interhemispheric falx and tentorium on the right. However, a new component of layering over the right frontoparietal convexity, measuring up to 8 mm in thickness. The small foci of acute intraparenchymal hemorrhage in the left frontal lobe appeared improved. No other new areas of acute intracranial hemorrhage. 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 ED provider had discussed the case with Dr. Trenton Gammon from neurosurgery who felt that this new finding on CT was redistribution of old blood loss and not a new bleed. He did not feel that any neurosurgical intervention was necessary. Hospital admission was offered at that time but patient and his wife declined.   Patient was recently admitted 12/10-12/11 for syncope resulting in a fall and head injury. CT head revealed subdural hematoma andintraparenchymal hematoma.  Patient is now admitted for recurrent orthostatic hypotension and syncope.  07/04/19:No acute events ON. Continue current Tx. COVID ordered for SNF.   Assessment  & Plan:   Principal Problem:   Syncope Active Problems:   OSA (obstructive sleep apnea)   Hyponatremia   PAF (paroxysmal atrial fibrillation) (HCC)   Subdural hematoma (HCC)  Recurrent syncope with orthostatic hypotension severe autonomic dysfunction from Parkinson's disease - Follows with Dr. [name], cardiology, as an outpatient - positive orthostatic - midodrine daily; BPs better - BP is ok, monitor, stable  UTI - UA was unremarkable but UCx growing e coli that is pansensative - cipro started; transitionedto macrobid; doing well - stable, doing well     - macrobid through 12/30; he is stable, continue  History of subdural hematoma and intraparenchymal hemorrhage - Patient recently admitted 12/10 for syncope resulting in fall and head injury; head CT revealed subdural hematoma and intraparenchymal hematoma.  - Repeat Head CT showing stable previously demonstrated subdural hematoma along the posterior aspect of the interhemispheric falx and tentorium on the right.However, a new component of layering over the right frontoparietal convexity, measuring up to 8 mm in thickness. The small foci of acute intraparenchymal hemorrhage in the left frontal lobe appeared improved. No other new areas of acute intracranial hemorrhage. 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  - ED provider had discussed the case with Dr. Trenton Gammon from neurosurgery who felt that this new finding on CT was redistribution of old blood loss and not a new bleed.He did not feel that any neurosurgical intervention was necessary - Hold off on any antiplatelet/anticoagulants   Paroxysmal atrial fibrillation - Currently in sinus rhythm.  - Not on any rate controlling medications.  - Not a candidate for anticoagulation given advanced age, history of falls, GI bleed, and subdural hematoma/intraparenchymal hemorrhage  Chronic  hyponatremia - Sodium ranging between 127-130 during recent hospitalization - Continue  home sodium chloride tablets - Na+ is ok, stable  Physical deconditioning Debility - PT evaluation recommending SNF; working on options  OSA - Continue CPAP  Parkinson's disease with dementia  - Has some mild cognitive impairment   Poor PO intake - encourage diet  Normocytic anemia - no evidence of frank bleed -he is stable, monitor  Hypomagnesemia Hypokalemia - replete, monitor  DVT prophylaxis: SCDs Code Status: DNR Family Communication: With wife at bedside   Disposition Plan: To SNF when bed available  Antimicrobials:  . macrobid (END 12/30)   Subjective: "She's ok."  Objective: Vitals:   07/03/19 1427 07/03/19 1944 07/04/19 0508 07/04/19 1500  BP: 124/87 130/90 127/85   Pulse: 89 92 86 83  Resp:  20 20 19   Temp: 97.6 F (36.4 C) 98.6 F (37 C) 98.2 F (36.8 C) 98.2 F (36.8 C)  TempSrc: Oral Oral Axillary Tympanic  SpO2: 96% 98%  100%  Weight:   67.2 kg   Height:        Intake/Output Summary (Last 24 hours) at 07/04/2019 1603 Last data filed at 07/04/2019 1006 Gross per 24 hour  Intake 360 ml  Output 975 ml  Net -615 ml   Filed Weights   07/02/19 0333 07/03/19 0245 07/04/19 0508  Weight: 69.2 kg 68.1 kg 67.2 kg    Examination:  General: 85 y.o. male resting in bed in NAD Cardiovascular: RRR, +S1, S2, no m/g/r Respiratory: CTABL, no w/r/r GI: BS+, NDNT, no masses noted, no organomegaly noted MSK: No e/c/c Neuro: alert to name, follows commands   Data Reviewed: I have personally reviewed following labs and imaging studies.  CBC: Recent Labs  Lab 06/29/19 0351 06/30/19 0308 07/01/19 1016 07/02/19 0302 07/03/19 0541  WBC 7.1 6.7 4.1 3.6* 3.6*  NEUTROABS  --   --  2.9 2.3 2.0  HGB 12.0* 11.3* 11.0* 10.8* 11.5*  HCT 34.9* 32.7* 32.9* 31.6* 33.7*  MCV 81.2 81.8 82.7 81.4 82.6  PLT 166 195 216 219  [id number]   Basic Metabolic Panel: Recent Labs  Lab 06/29/19 0351 06/30/19 0308 07/01/19 1016 07/02/19 0302 07/03/19 0541  NA 129* 129* 132* 131* 133*  K 3.1* 4.0 3.7 3.5 3.4*  CL 99 101 102 102 102  CO2 20* 19* 19* 20* 23  GLUCOSE 112* 120* 124* 100* 104*  BUN 17 20 18 16 10   CREATININE 0.93 0.82 1.17 0.73 0.65  CALCIUM 8.3* 8.2* 8.1* 7.8* 7.9*  MG  --   --  1.5* 1.4* 1.6*  PHOS  --   --  3.0 3.6 3.3   GFR: Estimated Creatinine Clearance: 56 mL/min (by C-G formula based on SCr of 0.65 mg/dL). Liver Function Tests: Recent Labs  Lab 07/01/19 1016 07/02/19 0302 07/03/19 0541  ALBUMIN 2.3* 2.2* 2.3*   No results for input(s): LIPASE, AMYLASE in the last 168 hours. No results for input(s): AMMONIA in the last 168 hours. Coagulation Profile: No results for input(s): INR, PROTIME in the last 168 hours. Cardiac Enzymes: No results for input(s): CKTOTAL, CKMB, CKMBINDEX, TROPONINI in the last 168 hours. BNP (last 3 results) No results for input(s): PROBNP in the last 8760 hours. HbA1C: No results for input(s): HGBA1C in the last 72 hours. CBG: Recent Labs  Lab 06/30/19 0554 07/01/19 0540 07/02/19 0434 07/03/19 0530 07/04/19 0506  GLUCAP 115* 109* 101* 96 104*   Lipid Profile: No results for input(s): CHOL, HDL, LDLCALC, TRIG, CHOLHDL, LDLDIRECT in the last 72 hours. Thyroid Function Tests: No results for input(s):  TSH, T4TOTAL, FREET4, T3FREE, THYROIDAB in the last 72 hours. Anemia Panel: No results for input(s): VITAMINB12, FOLATE, FERRITIN, TIBC, IRON, RETICCTPCT in the last 72 hours. Sepsis Labs: No results for input(s): PROCALCITON, LATICACIDVEN in the last 168 hours.  Recent Results (from the past 240 hour(s))  SARS CORONAVIRUS 2 (TAT 6-24 HRS) Nasopharyngeal Nasopharyngeal Swab     Status: None   Collection Time: 06/25/19  8:46 PM   Specimen: Nasopharyngeal Swab  Result Value Ref Range Status   SARS Coronavirus 2 NEGATIVE NEGATIVE Final    Comment:  (NOTE) SARS-CoV-2 target nucleic acids are NOT DETECTED. The SARS-CoV-2 RNA is generally detectable in upper and lower respiratory specimens during the acute phase of infection. Negative results do not preclude SARS-CoV-2 infection, do not rule out co-infections with other pathogens, and should not be used as the sole basis for treatment or other patient management decisions. Negative results must be combined with clinical observations, patient history, and epidemiological information. The expected result is Negative. Fact Sheet for Patients: SugarRoll.be Fact Sheet for Healthcare Providers: https://www.woods-mathews.com/ This test is not yet approved or cleared by the [place] FDA and  has been authorized for detection and/or diagnosis of SARS-CoV-2 by FDA under an Emergency Use Authorization (EUA). This EUA will remain  in effect (meaning this test can be used) for the duration of the COVID-19 declaration under Section 56 4(b)(1) of the Act, 21 U.S.C. section 360bbb-3(b)(1), unless the authorization is terminated or revoked sooner. Performed at [facility], [place] [address] [place], [place] [address]   Culture, blood (routine x 2)     Status: None   Collection Time: 06/28/19  7:50 AM   Specimen: BLOOD LEFT HAND  Result Value Ref Range Status   Specimen Description BLOOD LEFT HAND  Final   Special Requests   Final    BOTTLES DRAWN AEROBIC ONLY Blood Culture results may not be optimal due to an inadequate volume of blood received in culture bottles   Culture   Final    NO GROWTH 5 DAYS Performed at [facility], [place] [address], [place], [place] [address]    Report Status 07/03/2019 FINAL  Final  Culture, blood (routine x 2)     Status: None   Collection Time: 06/28/19  7:55 AM   Specimen: BLOOD LEFT HAND  Result Value Ref Range Status   Specimen Description BLOOD LEFT HAND  Final   Special Requests   Final    BOTTLES  DRAWN AEROBIC ONLY Blood Culture adequate volume   Culture   Final    NO GROWTH 5 DAYS Performed at [facility], [place] [address] [place],  [address]    Report Status 07/03/2019 FINAL  Final  Culture, Urine     Status: Abnormal   Collection Time: 06/28/19  3:15 PM   Specimen: Urine, Random  Result Value Ref Range Status   Specimen Description URINE, RANDOM  Final   Special Requests   Final    NONE Performed at [facility], [place] [address] [place], [place] [address]    Culture 80,000 COLONIES/mL ESCHERICHIA COLI (A)  Final   Report Status 06/30/2019 FINAL  Final   Organism ID, Bacteria ESCHERICHIA COLI (A)  Final      Susceptibility   Escherichia coli - MIC*    AMPICILLIN >=32 RESISTANT Resistant     CEFAZOLIN <=4 SENSITIVE Sensitive     CEFTRIAXONE <=1 SENSITIVE Sensitive     CIPROFLOXACIN 1  SENSITIVE Sensitive     GENTAMICIN >=16 RESISTANT Resistant     IMIPENEM <=0.25 SENSITIVE Sensitive     NITROFURANTOIN <=16 SENSITIVE Sensitive     TRIMETH/SULFA >=320 RESISTANT Resistant     AMPICILLIN/SULBACTAM >=32 RESISTANT Resistant     PIP/TAZO <=4 SENSITIVE Sensitive     * 80,000 COLONIES/mL ESCHERICHIA COLI  Respiratory Panel by PCR     Status: None   Collection Time: 06/29/19 10:27 AM   Specimen: Nasopharyngeal Swab; Respiratory  Result Value Ref Range Status   Adenovirus NOT DETECTED NOT DETECTED Final   Coronavirus 229E NOT DETECTED NOT DETECTED Final    Comment: (NOTE) The Coronavirus on the Respiratory Panel, DOES NOT test for the novel  Coronavirus (2019 nCoV)    Coronavirus HKU1 NOT DETECTED NOT DETECTED Final   Coronavirus NL63 NOT DETECTED NOT DETECTED Final   Coronavirus OC43 NOT DETECTED NOT DETECTED Final   Metapneumovirus NOT DETECTED NOT DETECTED Final   Rhinovirus / Enterovirus NOT DETECTED NOT DETECTED Final   Influenza A NOT DETECTED NOT DETECTED Final   Influenza B NOT DETECTED NOT DETECTED Final   Parainfluenza Virus 1 NOT DETECTED NOT  DETECTED Final   Parainfluenza Virus 2 NOT DETECTED NOT DETECTED Final   Parainfluenza Virus 3 NOT DETECTED NOT DETECTED Final   Parainfluenza Virus 4 NOT DETECTED NOT DETECTED Final   Respiratory Syncytial Virus NOT DETECTED NOT DETECTED Final   Bordetella pertussis NOT DETECTED NOT DETECTED Final   Chlamydophila pneumoniae NOT DETECTED NOT DETECTED Final   Mycoplasma pneumoniae NOT DETECTED NOT DETECTED Final    Comment: Performed at [facility], [place]. [address], [place], [place] [address]      Radiology Studies: No results found.   Scheduled Meds: . ferrous sulfate  325 mg Oral TID WC  . loratadine  10 mg Oral QHS  . magnesium oxide  800 mg Oral BID  . Melatonin  4.5 mg Oral QHS  . midodrine  2.5 mg Oral Daily  . pantoprazole  40 mg Oral Daily  . potassium chloride  40 mEq Oral Daily  . pravastatin  40 mg Oral QHS  . psyllium  1 packet Oral BID  . sodium chloride flush  3 mL Intravenous Q12H  . sodium chloride  1 g Oral Q breakfast   Continuous Infusions:   LOS: 8 days    Time spent: 25 minutes spent in the coordination of care today.    Jonnie Finner, DO [facility]  If 7PM-7AM, please contact night-coverage [email or web address] 07/04/2019, 4:03 PM

## 2019-07-04 NOTE — Progress Notes (Signed)
Pts wife is able to place pt on home CPAP without difficulty or assistance. RT asked pts wife if they needed anything for the CPAP and she stated not right now. RT will continue to monitor.

## 2019-07-05 DIAGNOSIS — R5381 Other malaise: Secondary | ICD-10-CM

## 2019-07-05 DIAGNOSIS — N390 Urinary tract infection, site not specified: Secondary | ICD-10-CM

## 2019-07-05 LAB — CBC WITH DIFFERENTIAL/PLATELET
Abs Immature Granulocytes: 0.02 10*3/uL (ref 0.00–0.07)
Basophils Absolute: 0 10*3/uL (ref 0.0–0.1)
Basophils Relative: 0 %
Eosinophils Absolute: 0.1 10*3/uL (ref 0.0–0.5)
Eosinophils Relative: 1 %
HCT: 33.7 % — ABNORMAL LOW (ref 39.0–52.0)
Hemoglobin: 11.2 g/dL — ABNORMAL LOW (ref 13.0–17.0)
Immature Granulocytes: 0 %
Lymphocytes Relative: 13 %
Lymphs Abs: 0.9 10*3/uL (ref 0.7–4.0)
MCH: 27.7 pg (ref 26.0–34.0)
MCHC: 33.2 g/dL (ref 30.0–36.0)
MCV: 83.4 fL (ref 80.0–100.0)
Monocytes Absolute: 1.2 10*3/uL — ABNORMAL HIGH (ref 0.1–1.0)
Monocytes Relative: 18 %
Neutro Abs: 4.4 10*3/uL (ref 1.7–7.7)
Neutrophils Relative %: 68 %
Platelets: 310 10*3/uL (ref 150–400)
RBC: 4.04 MIL/uL — ABNORMAL LOW (ref 4.22–5.81)
RDW: 14.3 % (ref 11.5–15.5)
WBC: 6.5 10*3/uL (ref 4.0–10.5)
nRBC: 0 % (ref 0.0–0.2)

## 2019-07-05 LAB — RENAL FUNCTION PANEL
Albumin: 2.3 g/dL — ABNORMAL LOW (ref 3.5–5.0)
Anion gap: 9 (ref 5–15)
BUN: 16 mg/dL (ref 8–23)
CO2: 24 mmol/L (ref 22–32)
Calcium: 8.1 mg/dL — ABNORMAL LOW (ref 8.9–10.3)
Chloride: 98 mmol/L (ref 98–111)
Creatinine, Ser: 0.96 mg/dL (ref 0.61–1.24)
GFR calc Af Amer: >60 mL/min (ref >60)
GFR calc non Af Amer: >60 mL/min (ref >60)
Glucose, Bld: 126 mg/dL — ABNORMAL HIGH (ref 70–99)
Phosphorus: 3.6 mg/dL (ref 2.5–4.6)
Potassium: 4.2 mmol/L (ref 3.5–5.1)
Sodium: 131 mmol/L — ABNORMAL LOW (ref 135–145)

## 2019-07-05 LAB — MAGNESIUM: Magnesium: 1.5 mg/dL — ABNORMAL LOW (ref 1.7–2.4)

## 2019-07-05 LAB — GLUCOSE, CAPILLARY: Glucose-Capillary: 115 mg/dL — ABNORMAL HIGH (ref 70–99)

## 2019-07-05 MED ORDER — MIDODRINE HCL 2.5 MG PO TABS: 2.5000 mg | ORAL_TABLET | Freq: Every day | ORAL | 0 refills | Status: AC

## 2019-07-05 NOTE — Progress Notes (Signed)
Report given to a nurse at [facility].  Idolina Primer, RN

## 2019-07-05 NOTE — Progress Notes (Signed)
Physical Therapy Treatment Patient Details Name: Joe Glover MRN: [id number] DOB: April 08, 1925 Today's Date: 07/05/2019    History of Present Illness 85 y.o. male with medical history significant of paroxysmal atrial fibrillation, sick sinus syndrome, orthostatic hypotension and recurrent syncope, chronic hyponatremia, Parkinson's disease with autonomic dysfunction, dementia, hyperlipidemia, anemia, OSA, prostate cancer, subdural hematoma, subarachnoid hemorrhage, CVA with residual dysarthria, and conditions listed below presenting to the ED for evaluation of syncope.    PT Comments    Patient received in bed, very pleasant and willing to cooperate with therapy today; spouse also present and reports that they may be discharging to SNF as soon as tomorrow. Patient with much improved mobility today, able to perform bed mobility with min guard, functional transfers with ModAx1/RW, and gait approximately 65ft including forward/backward/sidestepping with RW and MinAx2 in front of bed. He remains easily fatigued.  He was positioned to comfort with bed in chair position and bed alarm active, spouse present this afternoon. Will continue to follow acutely. Continue to recommend SNF and 24/7A.    Follow Up Recommendations  SNF;Supervision/Assistance - 24 hour     Equipment Recommendations  Other (comment)(defer)    Recommendations for Other Services       Precautions / Restrictions Precautions Precautions: Fall Precaution Comments: h/o syncope Restrictions Weight Bearing Restrictions: No    Mobility  Bed Mobility Overal bed mobility: Needs Assistance Bed Mobility: Supine to Sit;Sit to Supine     Supine to sit: Min guard Sit to supine: Min guard   General bed mobility comments: min guard for to and from EOB, extended time and increased effort, sitting balance much better without significant posterior lean  Transfers Overall transfer level: Needs assistance Equipment used: Rolling  walker (2 wheeled) Transfers: Sit to/from Stand Sit to Stand: Mod assist         General transfer comment: Able to boost to full standing position with ModAx1 and standby of second person for safety, Max cues for hand placement and sequencing, able to get balance much more quickly once standing  Ambulation/Gait Ambulation/Gait assistance: Min assist;+2 safety/equipment Gait Distance (Feet): 6 Feet Assistive device: Rolling walker (2 wheeled) Gait Pattern/deviations: Step-through pattern;Shuffle;Decreased step length - left;Decreased step length - right Gait velocity: decreased   General Gait Details: steps forward x84ft, backward x21ft, and sideways to R x62ft with MinAx2 for balance and RW management, remains easily fatiguable   Stairs             Wheelchair Mobility    Modified Rankin (Stroke Patients Only)       Balance Overall balance assessment: Needs assistance Sitting-balance support: Feet supported;Bilateral upper extremity supported Sitting balance-Leahy Scale: Fair Sitting balance - Comments: S-min guard for safety   Standing balance support: Bilateral upper extremity supported;During functional activity Standing balance-Leahy Scale: Poor Standing balance comment: reliant on RW and external support                            Cognition Arousal/Alertness: Awake/alert Behavior During Therapy: WFL for tasks assessed/performed Overall Cognitive Status: Difficult to assess                                 General Comments: difficult to assess due to mumbled and garbled speech, wife reports this is baseline and he also has hearing issues, did follow commands very well today      Exercises  General Comments General comments (skin integrity, edema, etc.): VSS on room air      Pertinent Vitals/Pain Pain Assessment: Faces Pain Score: 0-No pain Faces Pain Scale: No hurt Pain Intervention(s): Limited activity within patient's  tolerance;Monitored during session    Home Living                      Prior Function            PT Goals (current goals can now be found in the care plan section) Acute Rehab PT Goals Patient Stated Goal: to improve BP management and reduce falls risk PT Goal Formulation: With patient/family Time For Goal Achievement: 07/11/19 Potential to Achieve Goals: Fair Progress towards PT goals: Progressing toward goals    Frequency    Min 3X/week      PT Plan Current plan remains appropriate    Co-evaluation              AM-PAC PT "6 Clicks" Mobility   Outcome Measure  Help needed turning from your back to your side while in a flat bed without using bedrails?: A Little Help needed moving from lying on your back to sitting on the side of a flat bed without using bedrails?: A Little Help needed moving to and from a bed to a chair (including a wheelchair)?: A Little Help needed standing up from a chair using your arms (e.g., wheelchair or bedside chair)?: A Lot Help needed to walk in hospital room?: A Lot Help needed climbing 3-5 steps with a railing? : Total 6 Click Score: 14    End of Session Equipment Utilized During Treatment: Gait belt Activity Tolerance: Patient limited by fatigue;Patient tolerated treatment well Patient left: in chair;with call bell/phone within reach;with chair alarm set;with family/visitor present(bed in chair position)   PT Visit Diagnosis: Muscle weakness (generalized) (M62.81);Dizziness and giddiness (R42)     Time: [id number] PT Time Calculation (min) (ACUTE ONLY): 14 min  Charges:  $Therapeutic Activity: 8-22 mins                     Windell Norfolk, DPT, PN1   Supplemental Physical Therapist [place]    Pager [phone] Acute Rehab Office [phone]

## 2019-07-05 NOTE — Progress Notes (Deleted)
Joe Glover  PROGRESS NOTE    Joe Glover  [id number] DOB: [birth year] DOA: 06/25/2019 PCP: [name], MD   Brief Narrative:   Joe Hoffmanis a 85 y.o.malewith medical history significant ofparoxysmal atrial fibrillation, sick sinus syndrome, orthostatic hypotension and recurrent syncope, chronic hyponatremia, Parkinson's disease with autonomic dysfunction, dementia,hyperlipidemia, anemia, OSA, prostate cancer, subdural hematoma, subarachnoid hemorrhage, CVAwith residual dysarthria, and conditions listed below presenting to the ED for evaluation of syncope. Systolic was 70 upon EMS arrival. He was given fluid bolus.Patient did not fall or sustain any injuries to his head.   He was seen earlier same day in the ED for the same problem and his systolic was in the 0000000 at that time. Head CT showing stable previously demonstrated subdural hematoma along the posterior aspect of the interhemispheric falx and tentorium on the right. However, a new component of layering over the right frontoparietal convexity, measuring up to 8 mm in thickness. The small foci of acute intraparenchymal hemorrhage in the left frontal lobe appeared improved. No other new areas of acute intracranial hemorrhage. 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 ED provider had discussed the case with Dr. Trenton Gammon from neurosurgery who felt that this new finding on CT was redistribution of old blood loss and not a new bleed. He did not feel that any neurosurgical intervention was necessary. Hospital admission was offered at that time but patient and his wife declined.   Patient was recently admitted 12/10-12/11 for syncope resulting in a fall and head injury. CT head revealed subdural hematoma andintraparenchymal hematoma.  Patient is now admitted for recurrent orthostatic hypotension and syncope.  07/05/19:Awaiting bed at SNF. COVID negative. Denies complaints this AM.   Assessment  & Plan:   Principal Problem:   Syncope Active Problems:   OSA (obstructive sleep apnea)   Hyponatremia   PAF (paroxysmal atrial fibrillation) (HCC)   Subdural hematoma (HCC)  Recurrent syncope with orthostatic hypotension severe autonomic dysfunction from Parkinson's disease - Follows with Dr. [name], cardiology, as an outpatient - positive orthostatic - midodrine daily; BPs better - BP is ok, monitor, stable     - tolerating midodrine and looks good today. Continue  UTI - UA was unremarkable but UCx growing e coli that is pansensative - cipro started; transitionedto macrobid; doing well - stable, doing well - macrobid through 12/30; he is stable, continue  History of subdural hematoma and intraparenchymal hemorrhage - Patient recently admitted 12/10 for syncope resulting in fall and head injury; head CT revealed subdural hematoma and intraparenchymal hematoma.  - Repeat Head CT showing stable previously demonstrated subdural hematoma along the posterior aspect of the interhemispheric falx and tentorium on the right.However, a new component of layering over the right frontoparietal convexity, measuring up to 8 mm in thickness. The small foci of acute intraparenchymal hemorrhage in the left frontal lobe appeared improved. No other new areas of acute intracranial hemorrhage. 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  - ED provider had discussed the case with Dr. Trenton Gammon from neurosurgery who felt that this new finding on CT was redistribution of old blood loss and not a new bleed.He did not feel that any neurosurgical intervention was necessary - Hold off on any antiplatelet/anticoagulants      - he is stable, continue as above.  Paroxysmal atrial fibrillation - Currently in sinus rhythm.  - Not on any rate controlling medications.  - Not a candidate for anticoagulation given advanced age,  history of falls, GI  bleed, and subdural hematoma/intraparenchymal hemorrhage  Chronic hyponatremia - Sodium ranging between 127-130 during recent hospitalization - Continue home sodium chloride tablets - Sodium is stable, monitor  Physical deconditioning Debility - PT evaluation recommending SNF; working on options     - COVID screen is negative, awaiting bed offer  OSA - Continue CPAP  Parkinson's disease with dementia  - Has some mild cognitive impairment   Poor PO intake - encourage diet  Normocytic anemia - no evidence of frank bleed -he is stable, monitor  Hypomagnesemia Hypokalemia -replete, monitor  DVT prophylaxis: SCDs Code Status: DNR Family Communication: With wife at bedside   Disposition Plan: To SNF when bed is available  Antimicrobials:  Joe Glover Macrobid through 12/30   ROS:  Denies CP, N, V . Remainder 10-pt ROS is negative for all not previously mentioned.  Subjective: "I'm good."  Objective: Vitals:   07/04/19 1500 07/04/19 2040 07/04/19 2145 07/05/19 0516  BP:  (!) 131/93  (!) 87/62  Pulse: 83 (!) 106  71  Resp: 19     Temp: 98.2 F (36.8 C) (!) 100.4 F (38 C) 99.4 F (37.4 C) 98.2 F (36.8 C)  TempSrc: Tympanic Oral Oral Oral  SpO2: 100% 95%  98%  Weight:    67.2 kg  Height:        Intake/Output Summary (Last 24 hours) at 07/05/2019 1330 Last data filed at 07/05/2019 1022 Gross per 24 hour  Intake 483 ml  Output 950 ml  Net -467 ml   Filed Weights   07/03/19 0245 07/04/19 0508 07/05/19 0516  Weight: 68.1 kg 67.2 kg 67.2 kg    Examination:  General: 85 y.o. male resting in bed in NAD Cardiovascular: RRR, +S1, S2, no m/g/r, equal pulses throughout Respiratory: CTABL, no w/r/r, normal WOB GI: BS+, NDNT, soft MSK: No e/c/c Neuro: alert, following commands Psyc: calm/cooperative   Data Reviewed: I have personally reviewed following labs and imaging studies.  CBC: Recent  Labs  Lab 07-17-2019 0308 07/01/19 1016 07/02/19 0302 07/03/19 0541 07/05/19 0423  WBC 6.7 4.1 3.6* 3.6* 6.5  NEUTROABS  --  2.9 2.3 2.0 4.4  HGB 11.3* 11.0* 10.8* 11.5* 11.2*  HCT 32.7* 32.9* 31.6* 33.7* 33.7*  MCV 81.8 82.7 81.4 82.6 83.4  PLT 195 216 219 253 [id number]   Basic Metabolic Panel: Recent Labs  Lab 07-17-19 0308 07/01/19 1016 07/02/19 0302 07/03/19 0541 07/05/19 0423  NA 129* 132* 131* 133* 131*  K 4.0 3.7 3.5 3.4* 4.2  CL 101 102 102 102 98  CO2 19* 19* 20* 23 24  GLUCOSE 120* 124* 100* 104* 126*  BUN 20 18 16 10 16   CREATININE 0.82 1.17 0.73 0.65 0.96  CALCIUM 8.2* 8.1* 7.8* 7.9* 8.1*  MG  --  1.5* 1.4* 1.6* 1.5*  PHOS  --  3.0 3.6 3.3 3.6   GFR: Estimated Creatinine Clearance: 46.7 mL/min (by C-G formula based on SCr of 0.96 mg/dL). Liver Function Tests: Recent Labs  Lab 07/01/19 1016 07/02/19 0302 07/03/19 0541 07/05/19 0423  ALBUMIN 2.3* 2.2* 2.3* 2.3*   No results for input(s): LIPASE, AMYLASE in the last 168 hours. No results for input(s): AMMONIA in the last 168 hours. Coagulation Profile: No results for input(s): INR, PROTIME in the last 168 hours. Cardiac Enzymes: No results for input(s): CKTOTAL, CKMB, CKMBINDEX, TROPONINI in the last 168 hours. BNP (last 3 results) No results for input(s): PROBNP in the last 8760 hours. HbA1C: No results for input(s): HGBA1C in the last 72  hours. CBG: Recent Labs  Lab 07/01/19 0540 07/02/19 0434 07/03/19 0530 07/04/19 0506 07/05/19 0514  GLUCAP 109* 101* 96 104* 115*   Lipid Profile: No results for input(s): CHOL, HDL, LDLCALC, TRIG, CHOLHDL, LDLDIRECT in the last 72 hours. Thyroid Function Tests: No results for input(s): TSH, T4TOTAL, FREET4, T3FREE, THYROIDAB in the last 72 hours. Anemia Panel: No results for input(s): VITAMINB12, FOLATE, FERRITIN, TIBC, IRON, RETICCTPCT in the last 72 hours. Sepsis Labs: No results for input(s): PROCALCITON, LATICACIDVEN in the last 168 hours.  Recent  Results (from the past 240 hour(s))  SARS CORONAVIRUS 2 (TAT 6-24 HRS) Nasopharyngeal Nasopharyngeal Swab     Status: None   Collection Time: 06/25/19  8:46 PM   Specimen: Nasopharyngeal Swab  Result Value Ref Range Status   SARS Coronavirus 2 NEGATIVE NEGATIVE Final    Comment: (NOTE) SARS-CoV-2 target nucleic acids are NOT DETECTED. The SARS-CoV-2 RNA is generally detectable in upper and lower respiratory specimens during the acute phase of infection. Negative results do not preclude SARS-CoV-2 infection, do not rule out co-infections with other pathogens, and should not be used as the sole basis for treatment or other patient management decisions. Negative results must be combined with clinical observations, patient history, and epidemiological information. The expected result is Negative. Fact Sheet for Patients: SugarRoll.be Fact Sheet for Healthcare Providers: https://www.woods-mathews.com/ This test is not yet approved or cleared by the [place] FDA and  has been authorized for detection and/or diagnosis of SARS-CoV-2 by FDA under an Emergency Use Authorization (EUA). This EUA will remain  in effect (meaning this test can be used) for the duration of the COVID-19 declaration under Section 56 4(b)(1) of the Act, 21 U.S.C. section 360bbb-3(b)(1), unless the authorization is terminated or revoked sooner. Performed at [facility], [place] [address] [place], [place] [address]   Culture, blood (routine x 2)     Status: None   Collection Time: 06/28/19  7:50 AM   Specimen: BLOOD LEFT HAND  Result Value Ref Range Status   Specimen Description BLOOD LEFT HAND  Final   Special Requests   Final    BOTTLES DRAWN AEROBIC ONLY Blood Culture results may not be optimal due to an inadequate volume of blood received in culture bottles   Culture   Final    NO GROWTH 5 DAYS Performed at [facility], [place] [address], [place],  [place] [address]    Report Status 07/03/2019 FINAL  Final  Culture, blood (routine x 2)     Status: None   Collection Time: 06/28/19  7:55 AM   Specimen: BLOOD LEFT HAND  Result Value Ref Range Status   Specimen Description BLOOD LEFT HAND  Final   Special Requests   Final    BOTTLES DRAWN AEROBIC ONLY Blood Culture adequate volume   Culture   Final    NO GROWTH 5 DAYS Performed at [facility], [place] [address], [place], [place] [address]    Report Status 07/03/2019 FINAL  Final  Culture, Urine     Status: Abnormal   Collection Time: 06/28/19  3:15 PM   Specimen: Urine, Random  Result Value Ref Range Status   Specimen Description URINE, RANDOM  Final   Special Requests   Final    NONE Performed at [facility], [place] [address], [place],  [address]    Culture 80,000 COLONIES/mL ESCHERICHIA COLI (A)  Final   Report Status 06/30/2019 FINAL  Final  Organism ID, Bacteria ESCHERICHIA COLI (A)  Final      Susceptibility   Escherichia coli - MIC*    AMPICILLIN >=32 RESISTANT Resistant     CEFAZOLIN <=4 SENSITIVE Sensitive     CEFTRIAXONE <=1 SENSITIVE Sensitive     CIPROFLOXACIN 1 SENSITIVE Sensitive     GENTAMICIN >=16 RESISTANT Resistant     IMIPENEM <=0.25 SENSITIVE Sensitive     NITROFURANTOIN <=16 SENSITIVE Sensitive     TRIMETH/SULFA >=320 RESISTANT Resistant     AMPICILLIN/SULBACTAM >=32 RESISTANT Resistant     PIP/TAZO <=4 SENSITIVE Sensitive     * 80,000 COLONIES/mL ESCHERICHIA COLI  Respiratory Panel by PCR     Status: None   Collection Time: 06/29/19 10:27 AM   Specimen: Nasopharyngeal Swab; Respiratory  Result Value Ref Range Status   Adenovirus NOT DETECTED NOT DETECTED Final   Coronavirus 229E NOT DETECTED NOT DETECTED Final    Comment: (NOTE) The Coronavirus on the Respiratory Panel, DOES NOT test for the novel  Coronavirus (2019 nCoV)    Coronavirus HKU1 NOT DETECTED NOT DETECTED Final   Coronavirus NL63 NOT DETECTED NOT DETECTED Final    Coronavirus OC43 NOT DETECTED NOT DETECTED Final   Metapneumovirus NOT DETECTED NOT DETECTED Final   Rhinovirus / Enterovirus NOT DETECTED NOT DETECTED Final   Influenza A NOT DETECTED NOT DETECTED Final   Influenza B NOT DETECTED NOT DETECTED Final   Parainfluenza Virus 1 NOT DETECTED NOT DETECTED Final   Parainfluenza Virus 2 NOT DETECTED NOT DETECTED Final   Parainfluenza Virus 3 NOT DETECTED NOT DETECTED Final   Parainfluenza Virus 4 NOT DETECTED NOT DETECTED Final   Respiratory Syncytial Virus NOT DETECTED NOT DETECTED Final   Bordetella pertussis NOT DETECTED NOT DETECTED Final   Chlamydophila pneumoniae NOT DETECTED NOT DETECTED Final   Mycoplasma pneumoniae NOT DETECTED NOT DETECTED Final    Comment: Performed at [facility], [place]. [address], [place], [place] [address]  SARS CORONAVIRUS 2 (TAT 6-24 HRS) Nasopharyngeal Nasopharyngeal Swab     Status: None   Collection Time: 07/04/19  6:30 PM   Specimen: Nasopharyngeal Swab  Result Value Ref Range Status   SARS Coronavirus 2 NEGATIVE NEGATIVE Final    Comment: (NOTE) SARS-CoV-2 target nucleic acids are NOT DETECTED. The SARS-CoV-2 RNA is generally detectable in upper and lower respiratory specimens during the acute phase of infection. Negative results do not preclude SARS-CoV-2 infection, do not rule out co-infections with other pathogens, and should not be used as the sole basis for treatment or other patient management decisions. Negative results must be combined with clinical observations, patient history, and epidemiological information. The expected result is Negative. Fact Sheet for Patients: SugarRoll.be Fact Sheet for Healthcare Providers: https://www.woods-mathews.com/ This test is not yet approved or cleared by the [place] FDA and  has been authorized for detection and/or diagnosis of SARS-CoV-2 by FDA under an Emergency Use Authorization (EUA). This EUA will  remain  in effect (meaning this test can be used) for the duration of the COVID-19 declaration under Section 56 4(b)(1) of the Act, 21 U.S.C. section 360bbb-3(b)(1), unless the authorization is terminated or revoked sooner. Performed at [facility], [place] [address] [place], [place] [address]       Radiology Studies: No results found.   Scheduled Meds: . ferrous sulfate  325 mg Oral TID WC  . loratadine  10 mg Oral QHS  . magnesium oxide  800 mg Oral BID  . Melatonin  4.5 mg  Oral QHS  . midodrine  2.5 mg Oral Daily  . pantoprazole  40 mg Oral Daily  . pravastatin  40 mg Oral QHS  . psyllium  1 packet Oral BID  . sodium chloride flush  3 mL Intravenous Q12H  . sodium chloride  1 g Oral Q breakfast   Continuous Infusions:   LOS: 9 days    Time spent: 25 minutes spent in the coordination of care today.    Jonnie Finner, DO [facility]  If 7PM-7AM, please contact night-coverage [email or web address] 07/05/2019, 1:30 PM

## 2019-07-05 NOTE — Discharge Summary (Signed)
. Physician Discharge Summary  [place] [id number] DOB: 10-01-1924 DOA: 06/25/2019  PCP: [name], MD  Admit date: 06/25/2019 Discharge date: 07/05/2019  Admitted From: Home Disposition:  Discharged to [facility]  Recommendations for Outpatient Follow-up:  1. Follow up with PCP in 1-2 weeks 2. Please obtain BMP/CBC in one week  Discharge Condition: Stable  CODE STATUS: DNR   Brief/Interim Summary: Joe Hoffmanis a 85 y.o.malewith medical history significant ofparoxysmal atrial fibrillation, sick sinus syndrome, orthostatic hypotension and recurrent syncope, chronic hyponatremia, Parkinson's disease with autonomic dysfunction, dementia,hyperlipidemia, anemia, OSA, prostate cancer, subdural hematoma, subarachnoid hemorrhage, CVAwith residual dysarthria, and conditions listed below presenting to the ED for evaluation of syncope. Systolic was 70 upon EMS arrival. He was given fluid bolus.Patient did not fall or sustain any injuries to his head.   He was seen earlier same day in the ED for the same problem and his systolic was in the 0000000 at that time. Head CT showing stable previously demonstrated subdural hematoma along the posterior aspect of the interhemispheric falx and tentorium on the right. However, a new component of layering over the right frontoparietal convexity, measuring up to 8 mm in thickness. The small foci of acute intraparenchymal hemorrhage in the left frontal lobe appeared improved. No other new areas of acute intracranial hemorrhage. 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 ED provider had discussed the case with Dr. Trenton Gammon from neurosurgery who felt that this new finding on CT was redistribution of old blood loss and not a new bleed. He did not feel that any neurosurgical intervention was necessary. Hospital admission was offered at that time but patient and his wife declined.  Patient was recently admitted  12/10-12/11 for syncope resulting in a fall and head injury. CT head revealed subdural hematoma andintraparenchymal hematoma. Patient is now admitted for recurrent orthostatic hypotension and syncope.  07/05/19:Awaiting bed at SNF. COVID negative. Denies complaints this AM.   Discharge Diagnoses:  Principal Problem:   Syncope Active Problems:   OSA (obstructive sleep apnea)   Hyponatremia   PAF (paroxysmal atrial fibrillation) (HCC)   Subdural hematoma (HCC)  Recurrent syncope with orthostatic hypotension severe autonomic dysfunction from Parkinson's disease - Follows with Dr. [name], cardiology, as an outpatient - positive orthostatic - midodrine daily; BPs better - BP is ok, monitor, stable     - tolerating midodrine and looks good today. Continue  UTI - UA was unremarkable but UCx growing e coli that is pansensative - cipro started; transitionedto macrobid; doing well - stable, doing well - s/p macrobid, stable  History of subdural hematoma and intraparenchymal hemorrhage - Patient recently admitted 12/10 for syncope resulting in fall and head injury; head CT revealed subdural hematoma and intraparenchymal hematoma.  - Repeat Head CT showing stable previously demonstrated subdural hematoma along the posterior aspect of the interhemispheric falx and tentorium on the right.However, a new component of layering over the right frontoparietal convexity, measuring up to 8 mm in thickness. The small foci of acute intraparenchymal hemorrhage in the left frontal lobe appeared improved. No other new areas of acute intracranial hemorrhage. 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  - ED provider had discussed the case with Dr. Trenton Gammon from neurosurgery who felt that this new finding on CT was redistribution of old blood loss and not a new bleed.He did not feel that any neurosurgical intervention was  necessary - Hold off on any antiplatelet/anticoagulants      - he is stable, continue  as above.  Paroxysmal atrial fibrillation - Currently in sinus rhythm.  - Not on any rate controlling medications.  - Not a candidate for anticoagulation given advanced age, history of falls, GI bleed, and subdural hematoma/intraparenchymal hemorrhage  Chronic hyponatremia - Sodium ranging between 127-130 during recent hospitalization - Continue home sodium chloride tablets -Sodium is stable, monitor  Physical deconditioning Debility - PT evaluation recommending SNF; working on options     - COVID screen is negative, awaiting bed offer  OSA - Continue CPAP  Parkinson's disease with dementia  - Has some mild cognitive impairment   Poor PO intake - encourage diet  Normocytic anemia - no evidence of frank bleed -he is stable, monitor  Hypomagnesemia Hypokalemia -replete, monitor  Discharge Instructions   Allergies as of 07/05/2019      Reactions   Aspirin Other (See Comments)   GI bleed   Augmentin [amoxicillin-pot Clavulanate] Hives   Hemocyte Plus [hematinic Plus Vit-minerals] Other (See Comments)   Unknown reaction   Penicillins Hives   Has patient had a PCN reaction causing immediate rash, facial/tongue/throat swelling, SOB or lightheadedness with hypotension: No Has patient had a PCN reaction causing severe rash involving mucus membranes or skin necrosis: Yes Has patient had a PCN reaction that required hospitalization: No Has patient had a PCN reaction occurring within the last 10 years: No If all of the above answers are "NO", then may proceed with Cephalosporin use.      Medication List    STOP taking these medications   cloNIDine 0.1 MG tablet Commonly known as: CATAPRES     TAKE these medications   acetaminophen 500 MG tablet Commonly known as: TYLENOL Take 500 mg by mouth at bedtime.   BOTOX  IJ Inject as directed every 3 (three) months. Next injection due end of December 2020 (used to stop drooling)   cyanocobalamin 1000 MCG/ML injection Commonly known as: (VITAMIN B-12) Inject 1,000 mcg into the muscle every 30 (thirty) days.   EPINEPHrine 0.3 mg/0.3 mL Soaj injection Commonly known as: EPI-PEN Inject 0.3 mg into the muscle daily as needed for anaphylaxis (allergic reaction).   ferrous sulfate 325 (65 FE) MG tablet Take 325 mg by mouth 3 (three) times daily with meals.   GenTeal 0.25-0.3 % Gel Generic drug: Carboxymethylcell-Hypromellose Place 1 drop into both eyes 2 (two) times daily.   ipratropium 0.03 % nasal spray Commonly known as: ATROVENT Place 1 spray into the nose 2 (two) times daily.   loratadine 10 MG tablet Commonly known as: CLARITIN Take 10 mg by mouth at bedtime.   magnesium oxide 400 MG tablet Commonly known as: MAG-OX Take 800 mg by mouth 2 (two) times daily.   Melatonin 5 MG Tabs Take 5 mg by mouth at bedtime.   midodrine 2.5 MG tablet Commonly known as: PROAMATINE Take 1 tablet (2.5 mg total) by mouth daily. Start taking on: July 06, 2019 What changed:   how much to take  how to take this  when to take this  additional instructions   omeprazole 20 MG capsule Commonly known as: PRILOSEC Take 20 mg by mouth daily before breakfast.   potassium chloride SA 20 MEQ tablet Commonly known as: KLOR-CON Take 20 mEq by mouth daily.   pravastatin 40 MG tablet Commonly known as: PRAVACHOL Take 40 mg by mouth at bedtime.   PRESCRIPTION MEDICATION Inhale into the lungs at bedtime. CPAP   PROBIOTIC DAILY PO Take 1 tablet by mouth at bedtime.   psyllium 58.6 %  packet Commonly known as: METAMUCIL Take 1 packet by mouth 2 (two) times daily. Mix in water and drink   sodium chloride 1 g tablet Take 1 tablet (1 g total) by mouth daily.   temazepam 7.5 MG capsule Commonly known as: Restoril Take 1 capsule (7.5 mg total) by mouth  at bedtime as needed for sleep. What changed: when to take this   Vitamin D3 50 MCG (2000 UT) Tabs Take 1,000-2,000 Units by mouth See admin instructions. Take one tablet (2000 units) by mouth with breakfast and supper, take 1/2 tablet (1000 units) with lunch            Durable Medical Equipment  (From admission, onward)         Start     Ordered   06/28/19 1523  For home use only DME Hospital bed  Once    Question Answer Comment  Length of Need Lifetime   Patient has (list medical condition): Orthostatic hypotension, Parkinson's disease   The above medical condition requires: Patient requires the ability to reposition frequently   Bed type Semi-electric   Support Surface: Gel Overlay      06/28/19 1524          Contact information for follow-up providers    Home, [facility] Follow up.   Specialty: Home Health Services Why: Registered Nurse, Physial Therapy, Aide-office to call you with visit times.  Contact information: [address] [place] [address] [phone]        [facility] Follow up.   Why: Hospital Bed-agency to call and set up delivery time.  Contact information: [place] [address] [phone]            Contact information for after-discharge care    Destination    [facility] .   Service: Skilled Nursing Contact information: [place] [address] [phone]                 Allergies  Allergen Reactions  . Aspirin Other (See Comments)    GI bleed  . Augmentin [Amoxicillin-Pot Clavulanate] Hives  . Hemocyte Plus [Hematinic Plus Vit-Minerals] Other (See Comments)    Unknown reaction  . Penicillins Hives    Has patient had a PCN reaction causing immediate rash, facial/tongue/throat swelling, SOB or lightheadedness with hypotension: No Has patient had a PCN reaction causing severe rash involving mucus membranes or skin necrosis:  Yes Has patient had a PCN reaction that required hospitalization: No Has patient had a PCN reaction occurring within the last 10 years: No If all of the above answers are "NO", then may proceed with Cephalosporin use.     Procedures/Studies: CT Head Wo Contrast  Result Date: 06/25/2019 CLINICAL DATA:  Near syncopal episode today. Recent hospitalization for fall with intracranial hemorrhage. EXAM: CT HEAD WITHOUT CONTRAST TECHNIQUE: Contiguous axial images were obtained from the base of the skull through the vertex without intravenous contrast. COMPARISON:  CT head 06/17/2019 and 06/18/2019. FINDINGS: Brain: The previously demonstrated subdural hematoma along the posterior aspect of the interhemispheric falx and tentorium on the right has not significantly changed. However, there is a new component layering over the right frontal and parietal convexities, best seen on the coronal images and measuring up to 8 mm in thickness (image 44/5). Both small foci of acute intraparenchymal hemorrhage in the left frontal lobe have improved compared with the prior study. No other new areas of acute intracranial  hemorrhage are seen. There is no midline shift, hydrocephalus or evidence of acute stroke. Extensive chronic small vessel ischemic changes are present in the periventricular white matter. 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 No acute vascular findings. Skull: No acute or focal findings. Sinuses/Orbits: The visualized paranasal sinuses, mastoid air cells and middle ears are clear. No acute orbital findings. Previous right lens surgery. Other: None. IMPRESSION: 1. The previously demonstrated subdural hematoma along the posterior aspect of the interhemispheric falx and tentorium on the right has not significantly changed. However, there is a new component layering over the right frontoparietal convexity, measuring up to 8 mm in thickness. 2. The small foci of acute  intraparenchymal hemorrhage in the left frontal lobe have improved. 3. No other new areas of acute intracranial hemorrhage. 4. 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 5. Extensive chronic small vessel ischemic changes in the periventricular white matter. 6. These results were called by telephone at the time of interpretation on 06/25/2019 at 3:16 pm to provider [facility] , who verbally acknowledged these results. Electronically Signed   By: Richardean Sale M.D.   On: 06/25/2019 15:16   CT HEAD WO CONTRAST  Result Date: 06/18/2019 CLINICAL DATA:  Follow up intracranial hemorrhage. History intracranial aneurysms. EXAM: CT HEAD WITHOUT CONTRAST TECHNIQUE: Contiguous axial images were obtained from the base of the skull through the vertex without intravenous contrast. COMPARISON:  CT head 06/17/2019 and 07/26/2018. FINDINGS: Brain: Small foci of acute hemorrhage in the left frontal periventricular white matter are stable, measuring up to 8 x 6 mm on image 18/3. Smaller focus on image 23/3 is stable. Patient also has a thin subdural hematoma along the posterior right aspect of the interhemispheric falx and tentorium, measuring 4 mm on image 21/3. This also appears unchanged. No new areas of intracranial hemorrhage are identified. Specifically, no evidence of subarachnoid hemorrhage. There is generalized atrophy with prominence of the ventricles and subarachnoid spaces. Extensive chronic small vessel ischemic changes are present in the periventricular white matter bilaterally. No evidence of acute cortical stroke. Vascular: Diffuse intracranial atherosclerosis again noted with 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 There is a fusiform aneurysm of the proximal left middle cerebral artery, measuring 9 x 9 mm, not significantly  changed. Skull: No acute or focal findings. Sinuses/Orbits: The visualized paranasal sinuses, mastoid air cells and middle ears are clear. No acute orbital findings. Previous lens surgery on the right. Other: None. IMPRESSION: 1. Stable small foci of acute hemorrhage in the left frontal periventricular white matter. 2. Stable thin subdural hematoma along the posterior right aspect of the interhemispheric falx and tentorium. No new areas intracranial hemorrhage. 3. 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 4. Stable atrophy and chronic small vessel ischemic changes. Electronically Signed   By: Richardean Sale M.D.   On: 06/18/2019 09:20   CT Head Wo Contrast  Result Date: 06/17/2019 CLINICAL DATA:  Syncopal episode. EXAM: CT HEAD WITHOUT CONTRAST CT CERVICAL SPINE WITHOUT CONTRAST TECHNIQUE: Multidetector CT imaging of the head and cervical spine was performed following the standard protocol without intravenous contrast. Multiplanar CT image reconstructions of the cervical spine were also generated. COMPARISON:  Head CT October 02, 2005 MRI of the head January 20, 2017 FINDINGS: CT HEAD FINDINGS Brain: There are 2  small foci of acute intraparenchymal hemorrhage in the left frontal lobe, measuring 9 mm, and 6 mm. There is no midline shift or hydrocephalus. No acute transcortical infarct is identified. Vascular: Atherosclerosis and views arterial megaly is identified. 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 Skull: No acute abnormality. Sinuses/Orbits: No acute finding. Other: None. CT CERVICAL SPINE FINDINGS Alignment: There is straightening of cervical spine either due to muscle spasm or positioning. Skull base and vertebrae: No acute fracture. No primary bone lesion or focal pathologic process. Soft tissues and spinal canal: No prevertebral fluid or swelling. No visible canal hematoma.  Disc levels: Degenerative joint changes of cervical spine with narrowed joint space and osteophyte formation are identified, more marked in the mid to lower cervical spine. Upper chest: Emphysematous changes of the lung apices are noted. Other: None. IMPRESSION: 1. There are 2 small foci of acute intraparenchymal hemorrhage in the left frontal lobe, measuring 9 mm, and 6 mm. 2. No acute fracture or dislocation of cervical spine. 3. 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 These results were called by telephone at the time of interpretation on 06/17/2019 at 9:09 pm to provider Fraser Din, who verbally acknowledged these results. Electronically Signed   By: Abelardo Diesel M.D.   On: 06/17/2019 21:12   CT Cervical Spine Wo Contrast  Result Date: 06/17/2019 CLINICAL DATA:  Syncopal episode. EXAM: CT HEAD WITHOUT CONTRAST CT CERVICAL SPINE WITHOUT CONTRAST TECHNIQUE: Multidetector CT imaging of the head and cervical spine was performed following the standard protocol without intravenous contrast. Multiplanar CT image reconstructions of the cervical spine were also generated. COMPARISON:  Head CT October 02, 2005 MRI of the head January 20, 2017 FINDINGS: CT HEAD FINDINGS Brain: There are 2 small foci of acute intraparenchymal hemorrhage in the left frontal lobe, measuring 9 mm, and 6 mm. There is no midline shift or hydrocephalus. No acute transcortical infarct is identified. Vascular: Atherosclerosis and views arterial megaly is identified. 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 Skull: No acute abnormality. Sinuses/Orbits: No acute finding. Other: None. CT CERVICAL SPINE FINDINGS Alignment: There is straightening of cervical spine either due to muscle spasm or positioning. Skull base and vertebrae: No acute fracture. No primary bone lesion or focal pathologic process. Soft tissues and spinal canal: No prevertebral fluid  or swelling. No visible canal hematoma. Disc levels: Degenerative joint changes of cervical spine with narrowed joint space and osteophyte formation are identified, more marked in the mid to lower cervical spine. Upper chest: Emphysematous changes of the lung apices are noted. Other: None. IMPRESSION: 1. There are 2 small foci of acute intraparenchymal hemorrhage in the left frontal lobe, measuring 9 mm, and 6 mm. 2. No acute fracture or dislocation of cervical spine. 3. 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 These results were called by telephone at the time of interpretation on 06/17/2019 at 9:09 pm to provider Fraser Din, who verbally acknowledged these results. Electronically Signed   By: Abelardo Diesel M.D.   On: 06/17/2019 21:12   DG CHEST PORT 1 VIEW  Result Date: 06/28/2019 CLINICAL DATA:  Fever. EXAM: PORTABLE CHEST 1 VIEW COMPARISON:  Chest x-rays dated 06/25/2019 and 11/11/2018 FINDINGS: Heart size and pulmonary vascularity are normal. Aortic atherosclerosis. Lungs are clear. Loop recorder in place. No significant bone abnormality. IMPRESSION: 1. No acute abnormalities. 2.  Aortic Atherosclerosis (  ICD10-I70.0). Electronically Signed   By: Lorriane Shire M.D.   On: 06/28/2019 11:48   DG Chest Port 1 View  Result Date: 06/25/2019 CLINICAL DATA:  Syncope EXAM: PORTABLE CHEST 1 VIEW COMPARISON:  11/09/2018 FINDINGS: Heart is normal size. Tortuous, calcified aorta. No confluent airspace opacities or effusions. No acute bony abnormality. Loop recorder device in place. IMPRESSION: No acute cardiopulmonary disease Electronically Signed   By: Rolm Baptise M.D.   On: 06/25/2019 20:56   ECHOCARDIOGRAM COMPLETE  Result Date: 06/18/2019   ECHOCARDIOGRAM REPORT   Patient Name:   Joe Glover Date of Exam: 06/18/2019 Medical Rec #:  [id number]       Height:       72.0 in Accession #:    [id number]      Weight:       175.3 lb Date of Birth:  03-13-1925       BSA:          2.01 m  Patient Age:    49 years        BP:           125/93 mmHg Patient Gender: M               HR:           74 bpm. Exam Location:  Inpatient Procedure: 2D Echo, Color Doppler and Cardiac Doppler Indications:    R55 Syncope  History:        Patient has prior history of Echocardiogram examinations, most                 recent 01/27/2017. Arrythmias:Atrial Fibrillation; Risk                 Factors:Sleep Apnea, Hypertension and Dyslipidemia. Parkinson's                 Disease.  Sonographer:    Raquel Sarna Senior RDCS Referring Phys: [place]  Sonographer Comments: Technically difficult study due to poor echo windows. IMPRESSIONS  1. Left ventricular ejection fraction, by visual estimation, is 60 to 65%. The left ventricle has normal function. There is no left ventricular hypertrophy.  2. The left ventricle has no regional wall motion abnormalities.  3. Global right ventricle has normal systolic function.The right ventricular size is normal. No increase in right ventricular wall thickness.  4. Left atrial size was normal.  5. Right atrial size was mildly dilated.  6. The mitral valve is normal in structure. Trivial mitral valve regurgitation.  7. The tricuspid valve is normal in structure. Tricuspid valve regurgitation is trivial.  8. The aortic valve is grossly normal. Aortic valve regurgitation is mild.  9. The pulmonic valve was grossly normal. Pulmonic valve regurgitation is not visualized. 10. The atrial septum is grossly normal. FINDINGS  Left Ventricle: Left ventricular ejection fraction, by visual estimation, is 60 to 65%. The left ventricle has normal function. The left ventricle has no regional wall motion abnormalities. There is no left ventricular hypertrophy. Right Ventricle: The right ventricular size is normal. No increase in right ventricular wall thickness. Global RV systolic function is has normal systolic function. Left Atrium: Left atrial size was normal in size. Right Atrium: Right atrial size  was mildly dilated Pericardium: There is no evidence of pericardial effusion. Mitral Valve: The mitral valve is normal in structure. Trivial mitral valve regurgitation. Tricuspid Valve: The tricuspid valve is normal in structure. Tricuspid valve regurgitation is trivial. Aortic Valve: The aortic valve is grossly normal. Aortic  valve regurgitation is mild. Pulmonic Valve: The pulmonic valve was grossly normal. Pulmonic valve regurgitation is not visualized. Pulmonic regurgitation is not visualized. Aorta: The aortic root and ascending aorta are structurally normal, with no evidence of dilitation. IAS/Shunts: The atrial septum is grossly normal.  LEFT VENTRICLE PLAX 2D LVIDd:         4.69 cm LVIDs:         3.69 cm LV PW:         0.98 cm LV IVS:        1.09 cm LVOT diam:     2.30 cm LV SV:         44 ml LV SV Index:   21.91 LVOT Area:     4.15 cm  RIGHT VENTRICLE RV S prime:     11.10 cm/s TAPSE (M-mode): 2.5 cm LEFT ATRIUM           Index       RIGHT ATRIUM           Index LA diam:      2.20 cm 1.09 cm/m  RA Area:     22.50 cm LA Vol (A2C): 21.7 ml 10.77 ml/m RA Volume:   65.80 ml  32.67 ml/m LA Vol (A4C): 32.3 ml 16.04 ml/m  AORTIC VALVE LVOT Vmax:   68.70 cm/s LVOT Vmean:  47.800 cm/s LVOT VTI:    0.112 m  AORTA Ao Root diam: 3.60 cm  SHUNTS Systemic VTI:  0.11 m Systemic Diam: 2.30 cm  Mertie Moores MD Electronically signed by Mertie Moores MD Signature Date/Time: 06/18/2019/3:30:05 PM    Final       Subjective: "I'm good"  Discharge Exam: Vitals:   07/04/19 2145 07/05/19 0516  BP:  (!) 87/62  Pulse:  71  Resp:    Temp: 99.4 F (37.4 C) 98.2 F (36.8 C)  SpO2:  98%   Vitals:   07/04/19 1500 07/04/19 2040 07/04/19 2145 07/05/19 0516  BP:  (!) 131/93  (!) 87/62  Pulse: 83 (!) 106  71  Resp: 19     Temp: 98.2 F (36.8 C) (!) 100.4 F (38 C) 99.4 F (37.4 C) 98.2 F (36.8 C)  TempSrc: Tympanic Oral Oral Oral  SpO2: 100% 95%  98%  Weight:    67.2 kg  Height:        General: 85  y.o. male resting in bed in NAD Cardiovascular: RRR, +S1, S2, no m/g/r, equal pulses throughout Respiratory: CTABL, no w/r/r, normal WOB GI: BS+, NDNT, soft MSK: No e/c/c Neuro: alert, following commands Psyc: calm/cooperative    The results of significant diagnostics from this hospitalization (including imaging, microbiology, ancillary and laboratory) are listed below for reference.     Microbiology: Recent Results (from the past 240 hour(s))  SARS CORONAVIRUS 2 (TAT 6-24 HRS) Nasopharyngeal Nasopharyngeal Swab     Status: None   Collection Time: 06/25/19  8:46 PM   Specimen: Nasopharyngeal Swab  Result Value Ref Range Status   SARS Coronavirus 2 NEGATIVE NEGATIVE Final    Comment: (NOTE) SARS-CoV-2 target nucleic acids are NOT DETECTED. The SARS-CoV-2 RNA is generally detectable in upper and lower respiratory specimens during the acute phase of infection. Negative results do not preclude SARS-CoV-2 infection, do not rule out co-infections with other pathogens, and should not be used as the sole basis for treatment or other patient management decisions. Negative results must be combined with clinical observations, patient history, and epidemiological information. The expected result is Negative. Fact Sheet  for Patients: SugarRoll.be Fact Sheet for Healthcare Providers: https://www.woods-mathews.com/ This test is not yet approved or cleared by the [place] FDA and  has been authorized for detection and/or diagnosis of SARS-CoV-2 by FDA under an Emergency Use Authorization (EUA). This EUA will remain  in effect (meaning this test can be used) for the duration of the COVID-19 declaration under Section 56 4(b)(1) of the Act, 21 U.S.C. section 360bbb-3(b)(1), unless the authorization is terminated or revoked sooner. Performed at [facility], [place] [address], [place], [place] [address]   Culture, blood (routine x 2)     Status:  None   Collection Time: 06/28/19  7:50 AM   Specimen: BLOOD LEFT HAND  Result Value Ref Range Status   Specimen Description BLOOD LEFT HAND  Final   Special Requests   Final    BOTTLES DRAWN AEROBIC ONLY Blood Culture results may not be optimal due to an inadequate volume of blood received in culture bottles   Culture   Final    NO GROWTH 5 DAYS Performed at [facility], [place] [address], [place], [place] [address]    Report Status 07/03/2019 FINAL  Final  Culture, blood (routine x 2)     Status: None   Collection Time: 06/28/19  7:55 AM   Specimen: BLOOD LEFT HAND  Result Value Ref Range Status   Specimen Description BLOOD LEFT HAND  Final   Special Requests   Final    BOTTLES DRAWN AEROBIC ONLY Blood Culture adequate volume   Culture   Final    NO GROWTH 5 DAYS Performed at [facility], [place] [address], [place], [place] [address]    Report Status 07/03/2019 FINAL  Final  Culture, Urine     Status: Abnormal   Collection Time: 06/28/19  3:15 PM   Specimen: Urine, Random  Result Value Ref Range Status   Specimen Description URINE, RANDOM  Final   Special Requests   Final    NONE Performed at [facility], [place] [address] [place], [place] [address]    Culture 80,000 COLONIES/mL ESCHERICHIA COLI (A)  Final   Report Status 06/30/2019 FINAL  Final   Organism ID, Bacteria ESCHERICHIA COLI (A)  Final      Susceptibility   Escherichia coli - MIC*    AMPICILLIN >=32 RESISTANT Resistant     CEFAZOLIN <=4 SENSITIVE Sensitive     CEFTRIAXONE <=1 SENSITIVE Sensitive     CIPROFLOXACIN 1 SENSITIVE Sensitive     GENTAMICIN >=16 RESISTANT Resistant     IMIPENEM <=0.25 SENSITIVE Sensitive     NITROFURANTOIN <=16 SENSITIVE Sensitive     TRIMETH/SULFA >=320 RESISTANT Resistant     AMPICILLIN/SULBACTAM >=32 RESISTANT Resistant     PIP/TAZO <=4 SENSITIVE Sensitive     * 80,000 COLONIES/mL ESCHERICHIA COLI  Respiratory Panel by PCR     Status: None   Collection Time:  06/29/19 10:27 AM   Specimen: Nasopharyngeal Swab; Respiratory  Result Value Ref Range Status   Adenovirus NOT DETECTED NOT DETECTED Final   Coronavirus 229E NOT DETECTED NOT DETECTED Final    Comment: (NOTE) The Coronavirus on the Respiratory Panel, DOES NOT test for the novel  Coronavirus (2019 nCoV)    Coronavirus HKU1 NOT DETECTED NOT DETECTED Final   Coronavirus NL63 NOT DETECTED NOT DETECTED Final   Coronavirus OC43 NOT DETECTED NOT DETECTED Final   Metapneumovirus NOT DETECTED NOT DETECTED Final   Rhinovirus / Enterovirus NOT DETECTED NOT DETECTED  Final   Influenza A NOT DETECTED NOT DETECTED Final   Influenza B NOT DETECTED NOT DETECTED Final   Parainfluenza Virus 1 NOT DETECTED NOT DETECTED Final   Parainfluenza Virus 2 NOT DETECTED NOT DETECTED Final   Parainfluenza Virus 3 NOT DETECTED NOT DETECTED Final   Parainfluenza Virus 4 NOT DETECTED NOT DETECTED Final   Respiratory Syncytial Virus NOT DETECTED NOT DETECTED Final   Bordetella pertussis NOT DETECTED NOT DETECTED Final   Chlamydophila pneumoniae NOT DETECTED NOT DETECTED Final   Mycoplasma pneumoniae NOT DETECTED NOT DETECTED Final    Comment: Performed at [facility], [place] [address], [place], [place] [address]  SARS CORONAVIRUS 2 (TAT 6-24 HRS) Nasopharyngeal Nasopharyngeal Swab     Status: None   Collection Time: 07/04/19  6:30 PM   Specimen: Nasopharyngeal Swab  Result Value Ref Range Status   SARS Coronavirus 2 NEGATIVE NEGATIVE Final    Comment: (NOTE) SARS-CoV-2 target nucleic acids are NOT DETECTED. The SARS-CoV-2 RNA is generally detectable in upper and lower respiratory specimens during the acute phase of infection. Negative results do not preclude SARS-CoV-2 infection, do not rule out co-infections with other pathogens, and should not be used as the sole basis for treatment or other patient management decisions. Negative results must be combined with clinical observations, patient history, and  epidemiological information. The expected result is Negative. Fact Sheet for Patients: SugarRoll.be Fact Sheet for Healthcare Providers: https://www.woods-mathews.com/ This test is not yet approved or cleared by the [place] FDA and  has been authorized for detection and/or diagnosis of SARS-CoV-2 by FDA under an Emergency Use Authorization (EUA). This EUA will remain  in effect (meaning this test can be used) for the duration of the COVID-19 declaration under Section 56 4(b)(1) of the Act, 21 U.S.C. section 360bbb-3(b)(1), unless the authorization is terminated or revoked sooner. Performed at [facility], [place] [address], [place], [place] [address]      Labs: BNP (last 3 results) No results for input(s): BNP in the last 8760 hours. Basic Metabolic Panel: Recent Labs  Lab 06/30/19 0308 07/01/19 1016 07/02/19 0302 07/03/19 0541 07/05/19 0423  NA 129* 132* 131* 133* 131*  K 4.0 3.7 3.5 3.4* 4.2  CL 101 102 102 102 98  CO2 19* 19* 20* 23 24  GLUCOSE 120* 124* 100* 104* 126*  BUN 20 18 16 10 16   CREATININE 0.82 1.17 0.73 0.65 0.96  CALCIUM 8.2* 8.1* 7.8* 7.9* 8.1*  MG  --  1.5* 1.4* 1.6* 1.5*  PHOS  --  3.0 3.6 3.3 3.6   Liver Function Tests: Recent Labs  Lab 07/01/19 1016 07/02/19 0302 07/03/19 0541 07/05/19 0423  ALBUMIN 2.3* 2.2* 2.3* 2.3*   No results for input(s): LIPASE, AMYLASE in the last 168 hours. No results for input(s): AMMONIA in the last 168 hours. CBC: Recent Labs  Lab 06/30/19 0308 07/01/19 1016 07/02/19 0302 07/03/19 0541 07/05/19 0423  WBC 6.7 4.1 3.6* 3.6* 6.5  NEUTROABS  --  2.9 2.3 2.0 4.4  HGB 11.3* 11.0* 10.8* 11.5* 11.2*  HCT 32.7* 32.9* 31.6* 33.7* 33.7*  MCV 81.8 82.7 81.4 82.6 83.4  PLT 195 216 219 253 310   Cardiac Enzymes: No results for input(s): CKTOTAL, CKMB, CKMBINDEX, TROPONINI in the last 168 hours. BNP: Invalid input(s): POCBNP CBG: Recent Labs  Lab 07/01/19 0540  07/02/19 0434 07/03/19 0530 07/04/19 0506 07/05/19 0514  GLUCAP 109* 101* 96 104* 115*   D-Dimer No results for input(s): DDIMER in  the last 72 hours. Hgb A1c No results for input(s): HGBA1C in the last 72 hours. Lipid Profile No results for input(s): CHOL, HDL, LDLCALC, TRIG, CHOLHDL, LDLDIRECT in the last 72 hours. Thyroid function studies No results for input(s): TSH, T4TOTAL, T3FREE, THYROIDAB in the last 72 hours.  Invalid input(s): FREET3 Anemia work up No results for input(s): VITAMINB12, FOLATE, FERRITIN, TIBC, IRON, RETICCTPCT in the last 72 hours. Urinalysis    Component Value Date/Time   COLORURINE AMBER (A) 06/28/2019 1526   APPEARANCEUR CLEAR 06/28/2019 1526   LABSPEC 1.025 06/28/2019 1526   PHURINE 5.0 06/28/2019 1526   GLUCOSEU NEGATIVE 06/28/2019 1526   HGBUR SMALL (A) 06/28/2019 1526   BILIRUBINUR NEGATIVE 06/28/2019 1526   KETONESUR NEGATIVE 06/28/2019 1526   PROTEINUR 30 (A) 06/28/2019 1526   UROBILINOGEN 0.2 10/10/2014 1146   NITRITE NEGATIVE 06/28/2019 1526   LEUKOCYTESUR NEGATIVE 06/28/2019 1526   Sepsis Labs Invalid input(s): PROCALCITONIN,  WBC,  LACTICIDVEN Microbiology Recent Results (from the past 240 hour(s))  SARS CORONAVIRUS 2 (TAT 6-24 HRS) Nasopharyngeal Nasopharyngeal Swab     Status: None   Collection Time: 06/25/19  8:46 PM   Specimen: Nasopharyngeal Swab  Result Value Ref Range Status   SARS Coronavirus 2 NEGATIVE NEGATIVE Final    Comment: (NOTE) SARS-CoV-2 target nucleic acids are NOT DETECTED. The SARS-CoV-2 RNA is generally detectable in upper and lower respiratory specimens during the acute phase of infection. Negative results do not preclude SARS-CoV-2 infection, do not rule out co-infections with other pathogens, and should not be used as the sole basis for treatment or other patient management decisions. Negative results must be combined with clinical observations, patient history, and epidemiological information. The  expected result is Negative. Fact Sheet for Patients: SugarRoll.be Fact Sheet for Healthcare Providers: https://www.woods-mathews.com/ This test is not yet approved or cleared by the [place] FDA and  has been authorized for detection and/or diagnosis of SARS-CoV-2 by FDA under an Emergency Use Authorization (EUA). This EUA will remain  in effect (meaning this test can be used) for the duration of the COVID-19 declaration under Section 56 4(b)(1) of the Act, 21 U.S.C. section 360bbb-3(b)(1), unless the authorization is terminated or revoked sooner. Performed at [facility], [place] [address], [place], [place] [address]   Culture, blood (routine x 2)     Status: None   Collection Time: 06/28/19  7:50 AM   Specimen: BLOOD LEFT HAND  Result Value Ref Range Status   Specimen Description BLOOD LEFT HAND  Final   Special Requests   Final    BOTTLES DRAWN AEROBIC ONLY Blood Culture results may not be optimal due to an inadequate volume of blood received in culture bottles   Culture   Final    NO GROWTH 5 DAYS Performed at [facility], [place] [address], [place], [place] [address]    Report Status 07/03/2019 FINAL  Final  Culture, blood (routine x 2)     Status: None   Collection Time: 06/28/19  7:55 AM   Specimen: BLOOD LEFT HAND  Result Value Ref Range Status   Specimen Description BLOOD LEFT HAND  Final   Special Requests   Final    BOTTLES DRAWN AEROBIC ONLY Blood Culture adequate volume   Culture   Final    NO GROWTH 5 DAYS Performed at [facility], [place] [address], [place], [place] [address]    Report Status 07/03/2019 FINAL  Final  Culture, Urine  Status: Abnormal   Collection Time: 06/28/19  3:15 PM   Specimen: Urine, Random  Result Value Ref Range Status   Specimen Description URINE, RANDOM  Final   Special Requests   Final    NONE Performed at [facility], [address] [place], [place] [address]     Culture 80,000 COLONIES/mL ESCHERICHIA COLI (A)  Final   Report Status 06/30/2019 FINAL  Final   Organism ID, Bacteria ESCHERICHIA COLI (A)  Final      Susceptibility   Escherichia coli - MIC*    AMPICILLIN >=32 RESISTANT Resistant     CEFAZOLIN <=4 SENSITIVE Sensitive     CEFTRIAXONE <=1 SENSITIVE Sensitive     CIPROFLOXACIN 1 SENSITIVE Sensitive     GENTAMICIN >=16 RESISTANT Resistant     IMIPENEM <=0.25 SENSITIVE Sensitive     NITROFURANTOIN <=16 SENSITIVE Sensitive     TRIMETH/SULFA >=320 RESISTANT Resistant     AMPICILLIN/SULBACTAM >=32 RESISTANT Resistant     PIP/TAZO <=4 SENSITIVE Sensitive     * 80,000 COLONIES/mL ESCHERICHIA COLI  Respiratory Panel by PCR     Status: None   Collection Time: 06/29/19 10:27 AM   Specimen: Nasopharyngeal Swab; Respiratory  Result Value Ref Range Status   Adenovirus NOT DETECTED NOT DETECTED Final   Coronavirus 229E NOT DETECTED NOT DETECTED Final    Comment: (NOTE) The Coronavirus on the Respiratory Panel, DOES NOT test for the novel  Coronavirus (2019 nCoV)    Coronavirus HKU1 NOT DETECTED NOT DETECTED Final   Coronavirus NL63 NOT DETECTED NOT DETECTED Final   Coronavirus OC43 NOT DETECTED NOT DETECTED Final   Metapneumovirus NOT DETECTED NOT DETECTED Final   Rhinovirus / Enterovirus NOT DETECTED NOT DETECTED Final   Influenza A NOT DETECTED NOT DETECTED Final   Influenza B NOT DETECTED NOT DETECTED Final   Parainfluenza Virus 1 NOT DETECTED NOT DETECTED Final   Parainfluenza Virus 2 NOT DETECTED NOT DETECTED Final   Parainfluenza Virus 3 NOT DETECTED NOT DETECTED Final   Parainfluenza Virus 4 NOT DETECTED NOT DETECTED Final   Respiratory Syncytial Virus NOT DETECTED NOT DETECTED Final   Bordetella pertussis NOT DETECTED NOT DETECTED Final   Chlamydophila pneumoniae NOT DETECTED NOT DETECTED Final   Mycoplasma pneumoniae NOT DETECTED NOT DETECTED Final    Comment: Performed at [facility], [place]. [address] [place], [place]  [address]  SARS CORONAVIRUS 2 (TAT 6-24 HRS) Nasopharyngeal Nasopharyngeal Swab     Status: None   Collection Time: 07/04/19  6:30 PM   Specimen: Nasopharyngeal Swab  Result Value Ref Range Status   SARS Coronavirus 2 NEGATIVE NEGATIVE Final    Comment: (NOTE) SARS-CoV-2 target nucleic acids are NOT DETECTED. The SARS-CoV-2 RNA is generally detectable in upper and lower respiratory specimens during the acute phase of infection. Negative results do not preclude SARS-CoV-2 infection, do not rule out co-infections with other pathogens, and should not be used as the sole basis for treatment or other patient management decisions. Negative results must be combined with clinical observations, patient history, and epidemiological information. The expected result is Negative. Fact Sheet for Patients: SugarRoll.be Fact Sheet for Healthcare Providers: https://www.woods-mathews.com/ This test is not yet approved or cleared by the [place] FDA and  has been authorized for detection and/or diagnosis of SARS-CoV-2 by FDA under an Emergency Use Authorization (EUA). This EUA will remain  in effect (meaning this test can be used) for the duration of the COVID-19 declaration under Section 56 4(b)(1) of  the Act, 21 U.S.C. section 360bbb-3(b)(1), unless the authorization is terminated or revoked sooner. Performed at [facility], [place] [address], [place], [place] [address]      Time coordinating discharge: 35 minutes  SIGNED:   Jonnie Finner, DO  [facility] 07/05/2019, 2:21 PM   If 7PM-7AM, please contact night-coverage [email or web address]

## 2019-07-05 NOTE — TOC Transition Note (Signed)
Transition of Care [facility]) - CM/SW Discharge Note   Patient Details  Name: Granderson Fladger MRN: [id number] Date of Birth: 1925/05/19  [facility] CM/SW Contact:  Eileen Stanford, LCSW Phone Number: 07/05/2019, 2:42 PM   Clinical Narrative:   Clinical Social Worker facilitated patient discharge including contacting patient family and facility to confirm patient discharge plans.  Clinical information faxed to facility and family agreeable with plan.  CSW arranged ambulance transport via [facility] to [facility].  RN to call [phone] for report prior to discharge.    Final next level of care: Skilled Nursing Facility Barriers to Discharge: No Barriers Identified   Patient Goals and CMS Choice Patient states their goals for this hospitalization and ongoing recovery are:: "to return home" CMS Medicare.gov Compare Post Acute Care list provided to:: Patient Represenative (must comment)(WIfe Bubba Hales) Choice offered to / list presented to : Spouse  Discharge Placement              Patient chooses bed at: [facility] [place] Patient to be transferred to facility by: [place] Name of family member notified: Merry Proud Patient and family notified of of transfer: 07/05/19  Discharge Plan and Services In-house Referral: NA Discharge Planning Services: CM Consult Post Acute Care Choice: [place]          DME Arranged: Hospital bed DME Agency: [employer] Date DME Agency Contacted: 06/28/19 Time DME Agency Contacted: 1440 Representative spoke with at DME Agency: Thedore Mins HH Arranged: PT, RN, Disease Management, Nurse's Aide [place] Agency: [facility] [employer] Date [place]: 06/28/19 Time [place]: 1440 Representative spoke with at [place]: [place] ([place]) Interventions     Readmission Risk Interventions No flowsheet data found.

## 2019-07-13 ENCOUNTER — Emergency Department (HOSPITAL_COMMUNITY): Payer: Medicare Other

## 2019-07-13 ENCOUNTER — Inpatient Hospital Stay (HOSPITAL_COMMUNITY): Payer: Medicare Other

## 2019-07-13 ENCOUNTER — Inpatient Hospital Stay (HOSPITAL_COMMUNITY)
Admission: EM | Admit: 2019-07-13 | Discharge: 2019-08-09 | DRG: 871 | Disposition: E | Payer: Medicare Other | Attending: Internal Medicine | Admitting: Internal Medicine

## 2019-07-13 ENCOUNTER — Encounter (HOSPITAL_COMMUNITY): Payer: Self-pay | Admitting: *Deleted

## 2019-07-13 ENCOUNTER — Other Ambulatory Visit: Payer: Self-pay

## 2019-07-13 DIAGNOSIS — R7881 Bacteremia: Secondary | ICD-10-CM | POA: Diagnosis not present

## 2019-07-13 DIAGNOSIS — I480 Paroxysmal atrial fibrillation: Secondary | ICD-10-CM | POA: Diagnosis present

## 2019-07-13 DIAGNOSIS — A4151 Sepsis due to Escherichia coli [E. coli]: Secondary | ICD-10-CM | POA: Diagnosis present

## 2019-07-13 DIAGNOSIS — R197 Diarrhea, unspecified: Secondary | ICD-10-CM | POA: Diagnosis not present

## 2019-07-13 DIAGNOSIS — I951 Orthostatic hypotension: Secondary | ICD-10-CM | POA: Diagnosis present

## 2019-07-13 DIAGNOSIS — Z8546 Personal history of malignant neoplasm of prostate: Secondary | ICD-10-CM | POA: Diagnosis not present

## 2019-07-13 DIAGNOSIS — K521 Toxic gastroenteritis and colitis: Secondary | ICD-10-CM | POA: Diagnosis not present

## 2019-07-13 DIAGNOSIS — Z681 Body mass index (BMI) 19 or less, adult: Secondary | ICD-10-CM | POA: Diagnosis not present

## 2019-07-13 DIAGNOSIS — N390 Urinary tract infection, site not specified: Secondary | ICD-10-CM | POA: Diagnosis present

## 2019-07-13 DIAGNOSIS — F0280 Dementia in other diseases classified elsewhere without behavioral disturbance: Secondary | ICD-10-CM | POA: Diagnosis present

## 2019-07-13 DIAGNOSIS — Z85038 Personal history of other malignant neoplasm of large intestine: Secondary | ICD-10-CM

## 2019-07-13 DIAGNOSIS — E870 Hyperosmolality and hypernatremia: Secondary | ICD-10-CM | POA: Diagnosis present

## 2019-07-13 DIAGNOSIS — E785 Hyperlipidemia, unspecified: Secondary | ICD-10-CM | POA: Diagnosis present

## 2019-07-13 DIAGNOSIS — A419 Sepsis, unspecified organism: Secondary | ICD-10-CM

## 2019-07-13 DIAGNOSIS — E860 Dehydration: Secondary | ICD-10-CM | POA: Diagnosis present

## 2019-07-13 DIAGNOSIS — G20 Parkinson's disease: Secondary | ICD-10-CM | POA: Diagnosis present

## 2019-07-13 DIAGNOSIS — Z20822 Contact with and (suspected) exposure to covid-19: Secondary | ICD-10-CM | POA: Diagnosis present

## 2019-07-13 DIAGNOSIS — I10 Essential (primary) hypertension: Secondary | ICD-10-CM | POA: Diagnosis present

## 2019-07-13 DIAGNOSIS — E43 Unspecified severe protein-calorie malnutrition: Secondary | ICD-10-CM | POA: Diagnosis present

## 2019-07-13 DIAGNOSIS — Z515 Encounter for palliative care: Secondary | ICD-10-CM

## 2019-07-13 DIAGNOSIS — I69322 Dysarthria following cerebral infarction: Secondary | ICD-10-CM | POA: Diagnosis not present

## 2019-07-13 DIAGNOSIS — R296 Repeated falls: Secondary | ICD-10-CM | POA: Diagnosis present

## 2019-07-13 DIAGNOSIS — G4733 Obstructive sleep apnea (adult) (pediatric): Secondary | ICD-10-CM | POA: Diagnosis present

## 2019-07-13 DIAGNOSIS — B9620 Unspecified Escherichia coli [E. coli] as the cause of diseases classified elsewhere: Secondary | ICD-10-CM | POA: Diagnosis not present

## 2019-07-13 DIAGNOSIS — G9341 Metabolic encephalopathy: Secondary | ICD-10-CM | POA: Diagnosis present

## 2019-07-13 DIAGNOSIS — Z9181 History of falling: Secondary | ICD-10-CM

## 2019-07-13 DIAGNOSIS — R6520 Severe sepsis without septic shock: Secondary | ICD-10-CM | POA: Diagnosis present

## 2019-07-13 DIAGNOSIS — J690 Pneumonitis due to inhalation of food and vomit: Secondary | ICD-10-CM | POA: Diagnosis present

## 2019-07-13 DIAGNOSIS — E876 Hypokalemia: Secondary | ICD-10-CM | POA: Diagnosis present

## 2019-07-13 DIAGNOSIS — R0602 Shortness of breath: Secondary | ICD-10-CM | POA: Diagnosis present

## 2019-07-13 DIAGNOSIS — R140 Abdominal distension (gaseous): Secondary | ICD-10-CM

## 2019-07-13 DIAGNOSIS — J9601 Acute respiratory failure with hypoxia: Secondary | ICD-10-CM | POA: Diagnosis not present

## 2019-07-13 DIAGNOSIS — I509 Heart failure, unspecified: Secondary | ICD-10-CM

## 2019-07-13 DIAGNOSIS — Z79899 Other long term (current) drug therapy: Secondary | ICD-10-CM | POA: Diagnosis not present

## 2019-07-13 DIAGNOSIS — T3695XA Adverse effect of unspecified systemic antibiotic, initial encounter: Secondary | ICD-10-CM | POA: Diagnosis not present

## 2019-07-13 DIAGNOSIS — Z66 Do not resuscitate: Secondary | ICD-10-CM | POA: Diagnosis present

## 2019-07-13 DIAGNOSIS — Z87891 Personal history of nicotine dependence: Secondary | ICD-10-CM | POA: Diagnosis not present

## 2019-07-13 DIAGNOSIS — I495 Sick sinus syndrome: Secondary | ICD-10-CM | POA: Diagnosis present

## 2019-07-13 DIAGNOSIS — Z96641 Presence of right artificial hip joint: Secondary | ICD-10-CM | POA: Diagnosis present

## 2019-07-13 DIAGNOSIS — R0902 Hypoxemia: Secondary | ICD-10-CM | POA: Diagnosis present

## 2019-07-13 LAB — URINALYSIS, ROUTINE W REFLEX MICROSCOPIC
Bilirubin Urine: NEGATIVE
Glucose, UA: NEGATIVE mg/dL
Ketones, ur: NEGATIVE mg/dL
Nitrite: NEGATIVE
Protein, ur: 100 mg/dL — AB
Specific Gravity, Urine: 1.017 (ref 1.005–1.030)
WBC, UA: >50 WBC/hpf — ABNORMAL HIGH (ref 0–5)
pH: 5 (ref 5.0–8.0)

## 2019-07-13 LAB — CBC
HCT: 33 % — ABNORMAL LOW (ref 39.0–52.0)
Hemoglobin: 10.9 g/dL — ABNORMAL LOW (ref 13.0–17.0)
MCH: 27.9 pg (ref 26.0–34.0)
MCHC: 33 g/dL (ref 30.0–36.0)
MCV: 84.6 fL (ref 80.0–100.0)
Platelets: 289 10*3/uL (ref 150–400)
RBC: 3.9 MIL/uL — ABNORMAL LOW (ref 4.22–5.81)
RDW: 14.6 % (ref 11.5–15.5)
WBC: 21 10*3/uL — ABNORMAL HIGH (ref 4.0–10.5)
nRBC: 0 % (ref 0.0–0.2)

## 2019-07-13 LAB — BASIC METABOLIC PANEL
Anion gap: 13 (ref 5–15)
BUN: 31 mg/dL — ABNORMAL HIGH (ref 8–23)
CO2: 21 mmol/L — ABNORMAL LOW (ref 22–32)
Calcium: 8.3 mg/dL — ABNORMAL LOW (ref 8.9–10.3)
Chloride: 101 mmol/L (ref 98–111)
Creatinine, Ser: 1.46 mg/dL — ABNORMAL HIGH (ref 0.61–1.24)
GFR calc Af Amer: 48 mL/min — ABNORMAL LOW (ref >60)
GFR calc non Af Amer: 41 mL/min — ABNORMAL LOW (ref >60)
Glucose, Bld: 131 mg/dL — ABNORMAL HIGH (ref 70–99)
Potassium: 3.8 mmol/L (ref 3.5–5.1)
Sodium: 135 mmol/L (ref 135–145)

## 2019-07-13 LAB — RESPIRATORY PANEL BY RT PCR (FLU A&B, COVID)
Influenza A by PCR: NEGATIVE
Influenza B by PCR: NEGATIVE
SARS Coronavirus 2 by RT PCR: NEGATIVE

## 2019-07-13 LAB — APTT: aPTT: 41 seconds — ABNORMAL HIGH (ref 24–36)

## 2019-07-13 LAB — LACTIC ACID, PLASMA
Lactic Acid, Venous: 2.6 mmol/L (ref 0.5–1.9)
Lactic Acid, Venous: 2.4 mmol/L (ref 0.5–1.9)

## 2019-07-13 LAB — MAGNESIUM: Magnesium: 1.6 mg/dL — ABNORMAL LOW (ref 1.7–2.4)

## 2019-07-13 LAB — PROTIME-INR
INR: 1.2 (ref 0.8–1.2)
Prothrombin Time: 15.1 seconds (ref 11.4–15.2)

## 2019-07-13 MED ORDER — ONDANSETRON HCL 4 MG/2ML IJ SOLN
4.0000 mg | Freq: Once | INTRAMUSCULAR | Status: AC
  Administered 2019-07-13: 4 mg via INTRAVENOUS
  Filled 2019-07-13: qty 2

## 2019-07-13 MED ORDER — CYANOCOBALAMIN 1000 MCG/ML IJ SOLN
1000.0000 ug | INTRAMUSCULAR | Status: DC
  Filled 2019-07-13: qty 1

## 2019-07-13 MED ORDER — SODIUM CHLORIDE 0.9 % IV SOLN
2.0000 g | Freq: Once | INTRAVENOUS | Status: AC
  Administered 2019-07-13: 2 g via INTRAVENOUS
  Filled 2019-07-13: qty 2

## 2019-07-13 MED ORDER — VANCOMYCIN HCL IN DEXTROSE 1-5 GM/200ML-% IV SOLN
1000.0000 mg | Freq: Once | INTRAVENOUS | Status: AC
  Administered 2019-07-13: 1000 mg via INTRAVENOUS
  Filled 2019-07-13: qty 200

## 2019-07-13 MED ORDER — LORATADINE 10 MG PO TABS
10.0000 mg | ORAL_TABLET | Freq: Every day | ORAL | Status: DC
  Administered 2019-07-15 – 2019-07-17 (×3): 10 mg via ORAL
  Filled 2019-07-13 (×3): qty 1

## 2019-07-13 MED ORDER — VITAMIN D3 50 MCG (2000 UT) PO TABS: 1000.0000 [IU] | ORAL_TABLET | ORAL | Status: DC

## 2019-07-13 MED ORDER — MELATONIN 3 MG PO TABS
3.0000 mg | ORAL_TABLET | Freq: Every day | ORAL | Status: DC
  Administered 2019-07-15 – 2019-07-17 (×3): 3 mg via ORAL
  Filled 2019-07-13 (×5): qty 1

## 2019-07-13 MED ORDER — METOPROLOL TARTRATE 5 MG/5ML IV SOLN
2.5000 mg | Freq: Once | INTRAVENOUS | Status: DC
  Filled 2019-07-13: qty 5

## 2019-07-13 MED ORDER — VITAMIN D 25 MCG (1000 UNIT) PO TABS
1000.0000 [IU] | ORAL_TABLET | Freq: Every day | ORAL | Status: DC
  Administered 2019-07-15 – 2019-07-17 (×3): 1000 [IU] via ORAL
  Filled 2019-07-13 (×3): qty 1

## 2019-07-13 MED ORDER — LACTATED RINGERS IV BOLUS (SEPSIS)
1000.0000 mL | Freq: Once | INTRAVENOUS | Status: AC
  Administered 2019-07-13: 1000 mL via INTRAVENOUS

## 2019-07-13 MED ORDER — SODIUM CHLORIDE 0.9 % IV SOLN
1.0000 g | INTRAVENOUS | Status: DC
  Filled 2019-07-13: qty 1

## 2019-07-13 MED ORDER — ACETAMINOPHEN 650 MG RE SUPP
650.0000 mg | RECTAL | Status: DC | PRN
  Administered 2019-07-13: 650 mg via RECTAL
  Filled 2019-07-13 (×2): qty 1

## 2019-07-13 MED ORDER — FUROSEMIDE 10 MG/ML IJ SOLN
INTRAMUSCULAR | Status: AC
  Filled 2019-07-13: qty 2

## 2019-07-13 MED ORDER — MELATONIN 5 MG PO TABS: 5.0000 mg | ORAL_TABLET | Freq: Every day | ORAL | Status: DC

## 2019-07-13 MED ORDER — FUROSEMIDE 10 MG/ML IJ SOLN
20.0000 mg | Freq: Once | INTRAMUSCULAR | Status: AC
  Administered 2019-07-13: 18:00:00 20 mg via INTRAVENOUS

## 2019-07-13 MED ORDER — IPRATROPIUM BROMIDE 0.06 % NA SOLN
1.0000 | Freq: Two times a day (BID) | NASAL | Status: DC
  Administered 2019-07-14 – 2019-07-17 (×8): 1 via NASAL
  Filled 2019-07-13: qty 15

## 2019-07-13 MED ORDER — TEMAZEPAM 7.5 MG PO CAPS
7.5000 mg | ORAL_CAPSULE | Freq: Every evening | ORAL | Status: DC | PRN
  Administered 2019-07-15 – 2019-07-16 (×2): 7.5 mg via ORAL
  Filled 2019-07-13 (×2): qty 1

## 2019-07-13 MED ORDER — ACETAMINOPHEN 500 MG PO TABS: 500.0000 mg | ORAL_TABLET | Freq: Every day | ORAL | Status: DC

## 2019-07-13 MED ORDER — RISAQUAD PO CAPS
ORAL_CAPSULE | Freq: Every day | ORAL | Status: DC
  Administered 2019-07-16 – 2019-07-17 (×2): 1 via ORAL
  Filled 2019-07-13 (×5): qty 1

## 2019-07-13 MED ORDER — SODIUM CHLORIDE 1 G PO TABS
1.0000 g | ORAL_TABLET | Freq: Every day | ORAL | Status: DC
  Administered 2019-07-15 – 2019-07-16 (×2): 1 g via ORAL
  Filled 2019-07-13 (×4): qty 1

## 2019-07-13 MED ORDER — PRAVASTATIN SODIUM 40 MG PO TABS
40.0000 mg | ORAL_TABLET | Freq: Every day | ORAL | Status: DC
  Administered 2019-07-15 – 2019-07-17 (×3): 40 mg via ORAL
  Filled 2019-07-13 (×3): qty 1

## 2019-07-13 MED ORDER — VANCOMYCIN HCL 750 MG/150ML IV SOLN
750.0000 mg | INTRAVENOUS | Status: DC
  Administered 2019-07-14: 750 mg via INTRAVENOUS
  Filled 2019-07-13 (×2): qty 150

## 2019-07-13 MED ORDER — POLYVINYL ALCOHOL 1.4 % OP SOLN
1.0000 [drp] | Freq: Two times a day (BID) | OPHTHALMIC | Status: DC
  Administered 2019-07-14 – 2019-07-17 (×8): 1 [drp] via OPHTHALMIC
  Filled 2019-07-13: qty 15

## 2019-07-13 MED ORDER — IPRATROPIUM BROMIDE 0.02 % IN SOLN
0.5000 mg | RESPIRATORY_TRACT | Status: DC
  Administered 2019-07-14: 0.5 mg via RESPIRATORY_TRACT
  Filled 2019-07-13: qty 2.5

## 2019-07-13 MED ORDER — FERROUS SULFATE 325 (65 FE) MG PO TABS
325.0000 mg | ORAL_TABLET | Freq: Three times a day (TID) | ORAL | Status: DC
  Administered 2019-07-15 – 2019-07-17 (×8): 325 mg via ORAL
  Filled 2019-07-13 (×8): qty 1

## 2019-07-13 MED ORDER — VITAMIN D 25 MCG (1000 UNIT) PO TABS
1000.0000 [IU] | ORAL_TABLET | Freq: Two times a day (BID) | ORAL | Status: DC
  Administered 2019-07-15 – 2019-07-17 (×5): 1000 [IU] via ORAL
  Filled 2019-07-13 (×5): qty 1

## 2019-07-13 MED ORDER — METRONIDAZOLE IN NACL 5-0.79 MG/ML-% IV SOLN
500.0000 mg | Freq: Once | INTRAVENOUS | Status: AC
  Administered 2019-07-13: 500 mg via INTRAVENOUS
  Filled 2019-07-13: qty 100

## 2019-07-13 MED ORDER — METRONIDAZOLE IN NACL 5-0.79 MG/ML-% IV SOLN
500.0000 mg | Freq: Three times a day (TID) | INTRAVENOUS | Status: DC
  Administered 2019-07-13 – 2019-07-16 (×8): 500 mg via INTRAVENOUS
  Filled 2019-07-13 (×7): qty 100

## 2019-07-13 MED ORDER — MIDODRINE HCL 5 MG PO TABS
2.5000 mg | ORAL_TABLET | Freq: Every day | ORAL | Status: DC
  Administered 2019-07-15 – 2019-07-17 (×3): 2.5 mg via ORAL
  Filled 2019-07-13 (×4): qty 1

## 2019-07-13 MED ORDER — SODIUM CHLORIDE 0.9% FLUSH
3.0000 mL | Freq: Once | INTRAVENOUS | Status: AC
  Administered 2019-07-13: 3 mL via INTRAVENOUS

## 2019-07-13 MED ORDER — LACTATED RINGERS IV BOLUS (SEPSIS)
250.0000 mL | Freq: Once | INTRAVENOUS | Status: AC
  Administered 2019-07-13: 16:00:00 250 mL via INTRAVENOUS

## 2019-07-13 MED ORDER — MAGNESIUM SULFATE 2 GM/50ML IV SOLN
2.0000 g | Freq: Once | INTRAVENOUS | Status: AC
  Administered 2019-07-13: 2 g via INTRAVENOUS
  Filled 2019-07-13: qty 50

## 2019-07-13 MED ORDER — PANTOPRAZOLE SODIUM 40 MG PO TBEC
40.0000 mg | DELAYED_RELEASE_TABLET | Freq: Every day | ORAL | Status: DC
  Administered 2019-07-15 – 2019-07-17 (×3): 40 mg via ORAL
  Filled 2019-07-13 (×3): qty 1

## 2019-07-13 NOTE — H&P (Signed)
History and Physical    Joe Glover [id number] DOB: 25-Jul-1924 DOA: 07/31/2019  PCP: [name], MD   Patient coming from: NH  I have personally briefly reviewed patient's old medical records in [place]  Chief Complaint: Generalized weakness  HPI: Joe Glover is a 86 y.o. male with medical history significant of paroxysmal atrial fibrillation, sick sinus syndrome, orthostatic hypotension and recurrent syncope, chronic hyponatremia, Parkinson's disease with autonomic dysfunction, dementia, hyperlipidemia, anemia, OSA, prostate cancer, subdural hematoma, subarachnoid hemorrhage, CVA with residual dysarthria, and conditions listed below presenting to the ED for evaluation for increasing weakness.  Recently hospitalized for syncopal episode, found to have orthostatic hypotension secondary to advanced Parkinson disease, for which patient was started on midodrine also UTI-patient was discharged with Cipro.  Patient wife was at bedside, reported that patient has had worsening of weakness, and has been on pured diet but according to the wife who has been feeding the patient patient has had frequent cough even choking after eating.  Patient has had multiple episodes of loose diarrhea every day but denied any abdominal pain.  Patient wife reported she did not put the diarrhea is C. Difficile (she is confident of the smell of C. Difficile).  Wife denies and had any fever or chills at nursing home. ED Course: he was found to be hypotensive and hypoxia, code sepsis was called and patient was started on vancomycin and cefepime, 3 L IV fluid bolus was given and patient became hypertensive, tachycardia and wheezing.  Review of Systems: Unable to perform patient very lethargic   Past Medical History:  Diagnosis Date  . Awareness alteration, transient 11/02/2014  . Cellulitis of right hand 01/27/2017  . Colon cancer ([place])   . Essential hypertension 09/23/2012  . Fall 09/16/2016  .  Fall from standing 02/25/2017  . GI bleed   . Hyperlipidemia   . Hypertension   . Insomnia 08/13/2016  . Normocytic anemia 01/27/2017  . On amiodarone therapy 08/06/2015  . Orthostatic hypotension 08/06/2015  . OSA (obstructive sleep apnea)   . PAF (paroxysmal atrial fibrillation) ([place])   . Parkinson disease ([place])   . Prostate cancer ([place])   . SDH (subdural hematoma) ([place]) 09/16/2016  . Status post placement of implantable loop recorder 07/11/2015  . Stroke ([place])   . 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 Subdural hematoma ([place]) 09/05/2016  . Syncope 11/02/2014  . TIA (transient ischemic attack)   . Vitamin D deficiency     Past Surgical History:  Procedure Laterality Date  . COLECTOMY    . HERNIA REPAIR    . ORCHIECTOMY    . PROSTATECTOMY    . TOTAL HIP ARTHROPLASTY Right 11/11/2018   Procedure: TOTAL HIP ARTHROPLASTY ANTERIOR APPROACH;  Surgeon: Gaynelle Arabian, MD;  Location: [facility] ORS;  Service: Orthopedics;  Laterality: Right;  100     reports that he quit smoking about 61 years ago. His smoking use included cigarettes. He has a 40.00 pack-year smoking history. He has never used smokeless tobacco. He reports current alcohol use. He reports that he does not use drugs.  Allergies  Allergen Reactions  . Penicillins Hives    Augmentin Has patient had a PCN reaction causing immediate rash, facial/tongue/throat swelling, SOB or lightheadedness with hypotension: No Has patient had a PCN reaction causing severe rash involving mucus membranes or skin necrosis: Yes Has patient had a PCN reaction that required hospitalization:  No Has patient had a PCN reaction occurring within the last 10 years: No If all of the above answers are "NO", then may proceed with Cephalosporin use.  . Aspirin Other (See Comments)    GI bleed  . Hemocyte Plus [Hematinic Plus Vit-Minerals] Other (See Comments)    Unknown reaction     Family History  Problem Relation Age of Onset  . Parkinson's disease Mother   . Heart attack Father   . Heart attack Brother      Prior to Admission medications   Medication Sig Start Date End Date Taking? Authorizing Provider  acetaminophen (TYLENOL) 500 MG tablet Take 500 mg by mouth at bedtime.    [provider]  Carboxymethylcell-Hypromellose (GENTEAL) 0.25-0.3 % GEL Place 1 drop into both eyes 2 (two) times daily.    [provider]  Cholecalciferol (VITAMIN D3) 50 MCG (2000 UT) TABS Take 1,000-2,000 Units by mouth See admin instructions. Take one tablet (2000 units) by mouth with breakfast and supper, take 1/2 tablet (1000 units) with lunch    [provider]  cyanocobalamin (,VITAMIN B-12,) 1000 MCG/ML injection Inject 1,000 mcg into the muscle every 30 (thirty) days.     [provider]  EPINEPHrine 0.3 mg/0.3 mL IJ SOAJ injection Inject 0.3 mg into the muscle daily as needed for anaphylaxis (allergic reaction).     [provider]  ferrous sulfate 325 (65 FE) MG tablet Take 325 mg by mouth 3 (three) times daily with meals.     [provider]  ipratropium (ATROVENT) 0.03 % nasal spray Place 1 spray into the nose 2 (two) times daily. 08/20/17   [provider]  loratadine (CLARITIN) 10 MG tablet Take 10 mg by mouth at bedtime.     [provider]  magnesium oxide (MAG-OX) 400 MG tablet Take 800 mg by mouth 2 (two) times daily.     [provider]  Melatonin 5 MG TABS Take 5 mg by mouth at bedtime.     [provider]  midodrine (PROAMATINE) 2.5 MG tablet Take 1 tablet (2.5 mg total) by mouth daily. 07/06/19 08/05/19  Cherylann Ratel A, DO  omeprazole (PRILOSEC) 20 MG capsule Take 20 mg by mouth daily before breakfast.  04/26/14   [provider]  OnabotulinumtoxinA (BOTOX IJ) Inject as directed every 3 (three) months. Next injection due end of December 2020 (used to stop drooling)     [provider]  potassium chloride SA (K-DUR,KLOR-CON) 20 MEQ tablet Take 20 mEq by mouth daily.  08/04/18   [provider]  pravastatin (PRAVACHOL) 40 MG tablet Take 40 mg by mouth at bedtime.  05/07/14   [provider]  PRESCRIPTION MEDICATION Inhale into the lungs at bedtime. CPAP    [provider]  Probiotic Product (PROBIOTIC DAILY PO) Take 1 tablet by mouth at bedtime.     [provider]  psyllium (METAMUCIL) 58.6 % packet Take 1 packet by mouth 2 (two) times daily. Mix in water and drink    [provider]  sodium chloride 1 g tablet Take 1 tablet (1 g total) by mouth daily. 06/18/18   Richardo Priest, MD  temazepam (RESTORIL) 7.5 MG capsule Take 1 capsule (7.5 mg total) by mouth at bedtime as needed for sleep. Patient taking differently: Take 7.5 mg by mouth at bedtime.  07/26/18   Quintella Reichert, MD    Physical Exam: Vitals:   07/25/2019 1745 07/28/2019 1800 07/24/2019 1823 07/17/2019 1848  BP:  120/72 105/66 94/61 (!) 92/57  Pulse: (!) 130 (!) 122 (!) 119 (!) 116  Resp: (!) 28 (!) 27 (!) 29 (!) 26  Temp:    (!) 102.6 F (39.2 C)  TempSrc:    Axillary  SpO2: 93% 92% 93% 92%  Weight:      Height:        Constitutional: NAD, calm, comfortable Vitals:   07/29/2019 1745 07/11/2019 1800 08/04/2019 1823 08/02/2019 1848  BP: 120/72 105/66 94/61 (!) 92/57  Pulse: (!) 130 (!) 122 (!) 119 (!) 116  Resp: (!) 28 (!) 27 (!) 29 (!) 26  Temp:    (!) 102.6 F (39.2 C)  TempSrc:    Axillary  SpO2: 93% 92% 93% 92%  Weight:      Height:       Eyes: PERRL, lids and conjunctivae normal ENMT: Mucous membranes are moist. Posterior pharynx clear of any exudate or lesions.Normal dentition.  Neck: normal, supple, no masses, no thyromegaly Respiratory: Scattered crackles bilaterally, no wheezing, no crackles. Normal respiratory effort. No accessory muscle use.  Cardiovascular: Tachycardia, regular rate and rhythm, no murmurs / rubs / gallops. No  extremity edema. 2+ pedal pulses. No carotid bruits.  Abdomen: no tenderness, no masses palpated. No hepatosplenomegaly. Bowel sounds positive.  Musculoskeletal: no clubbing / cyanosis. No joint deformity upper and lower extremities. Good ROM, no contractures. Normal muscle tone.  Skin: no rashes, lesions, ulcers. No induration Neurologic: Moving all his limbs Psychiatric: Respond to voice, very confused very lethargic   Labs on Admission: I have personally reviewed following labs and imaging studies  CBC: Recent Labs  Lab 07/27/2019 1111  WBC 21.0*  HGB 10.9*  HCT 33.0*  MCV 84.6  PLT [id number]   Basic Metabolic Panel: Recent Labs  Lab 07/26/2019 1111 08/05/2019 1400  NA 135  --   K 3.8  --   CL 101  --   CO2 21*  --   GLUCOSE 131*  --   BUN 31*  --   CREATININE 1.46*  --   CALCIUM 8.3*  --   MG  --  1.6*   GFR: Estimated Creatinine Clearance: 30.2 mL/min (A) (by C-G formula based on SCr of 1.46 mg/dL (H)). Liver Function Tests: No results for input(s): AST, ALT, ALKPHOS, BILITOT, PROT, ALBUMIN in the last 168 hours. No results for input(s): LIPASE, AMYLASE in the last 168 hours. No results for input(s): AMMONIA in the last 168 hours. Coagulation Profile: Recent Labs  Lab 07/17/2019 1400  INR 1.2   Cardiac Enzymes: No results for input(s): CKTOTAL, CKMB, CKMBINDEX, TROPONINI in the last 168 hours. BNP (last 3 results) No results for input(s): PROBNP in the last 8760 hours. HbA1C: No results for input(s): HGBA1C in the last 72 hours. CBG: No results for input(s): GLUCAP in the last 168 hours. Lipid Profile: No results for input(s): CHOL, HDL, LDLCALC, TRIG, CHOLHDL, LDLDIRECT in the last 72 hours. Thyroid Function Tests: No results for input(s): TSH, T4TOTAL, FREET4, T3FREE, THYROIDAB in the last 72 hours. Anemia Panel: No results for input(s): VITAMINB12, FOLATE, FERRITIN, TIBC, IRON, RETICCTPCT in the last 72 hours. Urine analysis:    Component Value Date/Time    COLORURINE YELLOW 07/09/2019 1723   APPEARANCEUR CLOUDY (A) 08/01/2019 1723   LABSPEC 1.017 07/12/2019 1723   PHURINE 5.0 07/11/2019 1723   GLUCOSEU NEGATIVE 07/20/2019 1723   HGBUR MODERATE (A) 07/12/2019 1723   BILIRUBINUR NEGATIVE 08/02/2019 1723   KETONESUR NEGATIVE 08/06/2019 1723   PROTEINUR 100 (A)  07/20/2019 1723   UROBILINOGEN 0.2 10/10/2014 1146   NITRITE NEGATIVE 07/19/2019 1723   LEUKOCYTESUR LARGE (A) 07/30/2019 1723    Radiological Exams on Admission: DG Chest 1 View  Result Date: 08/08/2019 CLINICAL DATA:  Decreased oxygen saturation. EXAM: CHEST  1 VIEW COMPARISON:  July 13, 2019 FINDINGS: Mildly increased lung markings are seen with very mild, stable left basilar atelectasis. There is no evidence of a pleural effusion or pneumothorax. The cardiac silhouette is borderline in size. There is moderate severity calcification thoracic aorta. The visualized skeletal structures are unremarkable. IMPRESSION: 1. Very mild, stable left basilar atelectasis. Electronically Signed   By: Virgina Norfolk M.D.   On: 08/07/2019 18:16   DG Chest Port 1 View  Result Date: 07/09/2019 CLINICAL DATA:  Cough. EXAM: PORTABLE CHEST 1 VIEW COMPARISON:  06/28/2019. FINDINGS: Mediastinum hilar structures normal. Cardiac monitoring device again noted. Stable cardiomegaly. Mild left base atelectasis/infiltrate. No pleural effusion or pneumothorax. Degenerative change thoracic spine. IMPRESSION: 1.  Stable cardiomegaly. 2.  Mild left base atelectasis/infiltrate. Electronically Signed   By: Marcello Moores  Register   On: 07/26/2019 14:20    EKG: Independently reviewed.  Assessment/Plan Active Problems:   Sepsis ([place])  Sepsis, suspect aspiration pneumonia, patient spiked fever 102 in the ER, continue vancomycin and cefepime, add Flagyl to cover anaerobes.  N.p.o. for now, slow IV fluid given the event happened when the patient was more hypoxic, and a hypertensive tachycardia suspect fluid overload from fluid  resuscitation.  This was explained to the patient wife verbalized understanding.  Other possibilities including C. difficile colitis needs to be ruled out, Flagyl can cover as well.  Metabolic encephalopathy, from sepsis, as above.  History of recent subdural hematoma, no acute issue.  Paroxysmal A. Fib, now in sinus rhythm, poor candidate for systemic anticoagulation because of frequent falls and GI bleed, intracranial bleading history.  Generalized weakness, PT evaluation once sepsis resolved.    DVT prophylaxis: Heparin subcu Code Status: DNR discussed with patient's wife Family Communication: Patient wife at bedside  disposition Plan: Back to SNF Consults called: None Admission status: PCU   Lequita Halt MD [facility] Pager [phone]  If 7PM-7AM, please contact night-coverage [email or web address] Password [facility]  07/09/2019, 7:11 PM

## 2019-07-13 NOTE — ED Triage Notes (Signed)
Pt arrived by ems from [facility] for generalized weakness today. His baseline is garbled speech as deficit from stroke and normally ambulatory with walker. Pt had covid vaccine yesterday and staff reports him being lethargic today. No fever.

## 2019-07-13 NOTE — Progress Notes (Signed)
CSW spoke with Olivia Mackie at [facility] in [place] to provide her with an update regarding this patient. Olivia Mackie requesting to be notified ASAP if this patient is being discharged.  Madilyn Fireman, MSW, LCSW-A Transitions of Care  Clinical Social Worker  [facility] Emergency Departments  Medical ICU [phone]

## 2019-07-13 NOTE — ED Notes (Signed)
Paged admitting doctor. Pt's O2 sat dropped to 86-87%. Pt requiring 4L of O2 via [place] to maintain sats above 91%. Pt's HR is also tachy now in the 130's-140's with an elevated BP. Expiratory wheezes noted as well.

## 2019-07-13 NOTE — ED Notes (Signed)
Charge RN Roselyn Reef made aware of patient's hypotension, this RN requesting room for patient ASAP

## 2019-07-13 NOTE — ED Notes (Signed)
Patient transported to CT 

## 2019-07-13 NOTE — ED Notes (Signed)
Pearl Colbath (wife#[phone]) called/would like to know if she can come stay with him once in room/because cannot hear well or communicate well.

## 2019-07-13 NOTE — ED Notes (Signed)
EKG given to Dr Steinl °

## 2019-07-13 NOTE — ED Notes (Signed)
Pt sating at 96% on 3L O2, titrated down to 2L.

## 2019-07-13 NOTE — ED Provider Notes (Addendum)
[facility] EMERGENCY DEPARTMENT Provider Note   CSN: [id number] Arrival date & time: 07/27/2019  1052     History Chief Complaint  Patient presents with  . Weakness    Joe Glover is a 86 y.o. male.  HPI     Level 5 caveat for altered mental status.  86 year old with history of paroxysmal A. fib, stroke, intracranial hemorrhage comes into the ER with chief complaint of weakness from nursing home.  According to nursing home patient has been having diarrhea and they found him to be weak.  In the ER patient is noted to be hypotensive.  He has no complaints from his side.   Past Medical History:  Diagnosis Date  . Awareness alteration, transient 11/02/2014  . Cellulitis of right hand 01/27/2017  . Colon cancer ([place])   . Essential hypertension 09/23/2012  . Fall 09/16/2016  . Fall from standing 02/25/2017  . GI bleed   . Hyperlipidemia   . Hypertension   . Insomnia 08/13/2016  . Normocytic anemia 01/27/2017  . On amiodarone therapy 08/06/2015  . Orthostatic hypotension 08/06/2015  . OSA (obstructive sleep apnea)   . PAF (paroxysmal atrial fibrillation) ([place])   . Parkinson disease ([place])   . Prostate cancer ([place])   . SDH (subdural hematoma) ([place]) 09/16/2016  . Sepsis ([place]) 07/14/2019  . Status post placement of implantable loop recorder 07/11/2015  . Stroke ([place])   . 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 Subdural hematoma ([place]) 09/05/2016  . Syncope 11/02/2014  . TIA (transient ischemic attack)   . Vitamin D deficiency     Patient Active Problem List   Diagnosis Date Noted  . Hypernatremia   . Diarrhea 07/16/2019  . Protein-calorie malnutrition, severe 07/15/2019  . Severe sepsis with acute organ dysfunction ([place]) 07/14/2019  . E coli bacteremia 07/14/2019  . Aspiration pneumonia ([place]) 07/14/2019  . Acute metabolic encephalopathy 123456  . Acute lower UTI  07/14/2019  . Sepsis ([place]) 07/20/2019  . Intraparenchymal hematoma of brain due to trauma ([place]) 06/17/2019  . Closed right hip fracture ([place]) 11/09/2018  . Sick sinus syndrome ([place]) 09/16/2018  . Bradycardia 05/14/2018  . Fall from standing 02/25/2017  . Parkinson disease ([place]) 01/27/2017  . Hyponatremia 01/27/2017  . Hypokalemia 01/27/2017  . Cellulitis of right hand 01/27/2017  . Normocytic anemia 01/27/2017  . Fall 09/16/2016  . SDH (subdural hematoma) ([place]) 09/16/2016  . Subdural hematoma ([place]) 09/05/2016  . Insomnia 08/13/2016  . On amiodarone therapy 08/06/2015  . Orthostatic hypotension 08/06/2015  . PAF (paroxysmal atrial fibrillation) ([place]) 08/06/2015  . Status post placement of implantable loop recorder 07/11/2015  . 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 Syncope 11/02/2014  . Awareness alteration, transient 11/02/2014  . OSA (obstructive sleep apnea) 05/31/2014  . Essential hypertension 09/23/2012  . Prostate cancer ([place]) 09/23/2012    Past Surgical History:  Procedure Laterality Date  . COLECTOMY    . HERNIA REPAIR    . ORCHIECTOMY    . PROSTATECTOMY    . TOTAL HIP ARTHROPLASTY Right 11/11/2018   Procedure: TOTAL HIP ARTHROPLASTY ANTERIOR APPROACH;  Surgeon: Gaynelle Arabian, MD;  Location: [facility];  Service: Orthopedics;  Laterality: Right;  100       Family History  Problem Relation Age of Onset  . Parkinson's disease Mother   . Heart attack Father   . Heart attack  Brother     Social History   Tobacco Use  . Smoking status: Former Smoker    Packs/day: 2.00    Years: 20.00    Pack years: 40.00    Types: Cigarettes    Quit date: 07/08/1958    Years since quitting: 61.0  . Smokeless tobacco: Never Used  Substance Use Topics  . Alcohol use: Yes    Alcohol/week: 0.0 standard drinks    Comment: occasional - 1-2 drinks a month  . Drug use: No    Home Medications Prior to Admission medications     Medication Sig Start Date End Date Taking? Authorizing Provider  acetaminophen (TYLENOL) 325 MG tablet Take 650 mg by mouth every 4 (four) hours as needed for moderate pain.   Yes [provider]  acetaminophen (TYLENOL) 500 MG tablet Take 500 mg by mouth at bedtime.   Yes [provider]  Carboxymethylcell-Hypromellose (GENTEAL) 0.25-0.3 % GEL Place 1 drop into both eyes 2 (two) times daily.   Yes [provider]  Cholecalciferol (VITAMIN D3) 25 MCG (1000 UT) CAPS Take 1,000-2,000 Units by mouth See admin instructions. Take two tablet (2000 units) by mouth with breakfast and supper, take 1 tablet (1000 units) with lunch   Yes [provider]  cyanocobalamin (,VITAMIN B-12,) 1000 MCG/ML injection Inject 1,000 mcg into the muscle every 30 (thirty) days.    Yes [provider]  EPINEPHrine 0.3 mg/0.3 mL IJ SOAJ injection Inject 0.3 mg into the muscle daily as needed for anaphylaxis (allergic reaction).    Yes [provider]  ferrous sulfate 325 (65 FE) MG tablet Take 325 mg by mouth 3 (three) times daily with meals.    Yes [provider]  ipratropium (ATROVENT) 0.03 % nasal spray Place 1 spray into the nose 2 (two) times daily. 08/20/17  Yes [provider]  loperamide (IMODIUM) 1 MG/5ML solution Take 6 mg by mouth as needed for diarrhea or loose stools. 30 mL   Yes [provider]  loratadine (CLARITIN) 10 MG tablet Take 10 mg by mouth at bedtime.    Yes [provider]  magnesium oxide (MAG-OX) 400 MG tablet Take 800 mg by mouth 2 (two) times daily.    Yes [provider]  Melatonin 5 MG TABS Take 5 mg by mouth at bedtime.    Yes [provider]  midodrine (PROAMATINE) 2.5 MG tablet Take 1 tablet (2.5 mg total) by mouth daily. 07/06/19 08/05/19 Yes Kyle, Tyrone A, DO  omeprazole (PRILOSEC) 20 MG capsule Take 20 mg by mouth daily before breakfast.  04/26/14  Yes [provider]   potassium chloride SA (K-DUR,KLOR-CON) 20 MEQ tablet Take 20 mEq by mouth daily.  08/04/18  Yes [provider]  pravastatin (PRAVACHOL) 40 MG tablet Take 40 mg by mouth at bedtime.  05/07/14  Yes [provider]  Probiotic Product (PROBIOTIC DAILY PO) Take 1 tablet by mouth at bedtime.    Yes [provider]  psyllium (METAMUCIL) 58.6 % packet Take 1 packet by mouth 2 (two) times daily. Mix in water and drink   Yes [provider]  sodium chloride 1 g tablet Take 1 tablet (1 g total) by mouth daily. 06/18/18  Yes Richardo Priest, MD  temazepam (RESTORIL) 7.5 MG capsule Take 1 capsule (7.5 mg total) by mouth at bedtime as needed for sleep. Patient taking differently: Take 7.5 mg by mouth at bedtime.  07/26/18  Yes Quintella Reichert, MD  OnabotulinumtoxinA (BOTOX  IJ) Inject as directed every 3 (three) months. Next injection due end of December 2020 (used to stop drooling)    [provider]  PRESCRIPTION MEDICATION Inhale into the lungs at bedtime. CPAP    [provider]    Allergies    Penicillins, Aspirin, and Hemocyte plus [hematinic plus vit-minerals]  Review of Systems   Review of Systems  Unable to perform ROS: Dementia    Physical Exam Updated Vital Signs BP (!) 81/56   Pulse 84   Temp 97.7 F (36.5 C) (Oral)   Resp (!) 30   Ht 6' (1.829 m)   Wt 66.8 kg   SpO2 (!) 72%   BMI 19.97 kg/m   Physical Exam Vitals and nursing note reviewed.  Constitutional:      Appearance: He is well-developed.  HENT:     Head: Atraumatic.     Mouth/Throat:     Comments: Mucous membranes appear moist Cardiovascular:     Rate and Rhythm: Normal rate.  Pulmonary:     Effort: Pulmonary effort is normal.  Abdominal:     Tenderness: There is no abdominal tenderness.     Comments: No palpable mass  Musculoskeletal:     Cervical back: Neck supple.  Skin:    General: Skin is warm.  Neurological:     Mental Status: He is alert.     ED  Results / Procedures / Treatments   Labs (all labs ordered are listed, but only abnormal results are displayed) Labs Reviewed  CULTURE, BLOOD (ROUTINE X 2) - Abnormal; Notable for the following components:      Result Value   Culture ESCHERICHIA COLI (*)    All other components within normal limits  CULTURE, BLOOD (ROUTINE X 2) - Abnormal; Notable for the following components:   Culture   (*)    Value: ESCHERICHIA COLI SUSCEPTIBILITIES PERFORMED ON PREVIOUS CULTURE WITHIN THE LAST 5 DAYS. Performed at [facility], [place] [address], [place], [place] [address]    All other components within normal limits  URINE CULTURE - Abnormal; Notable for the following components:   Culture MULTIPLE SPECIES PRESENT, SUGGEST RECOLLECTION (*)    All other components within normal limits  BLOOD CULTURE ID PANEL (REFLEXED) - Abnormal; Notable for the following components:   Enterobacteriaceae species DETECTED (*)    Escherichia coli DETECTED (*)    All other components within normal limits  BASIC METABOLIC PANEL - Abnormal; Notable for the following components:   CO2 21 (*)    Glucose, Bld 131 (*)    BUN 31 (*)    Creatinine, Ser 1.46 (*)    Calcium 8.3 (*)    GFR calc non Af Amer 41 (*)    GFR calc Af Amer 48 (*)    All other components within normal limits  CBC - Abnormal; Notable for the following components:   WBC 21.0 (*)    RBC 3.90 (*)    Hemoglobin 10.9 (*)    HCT 33.0 (*)    All other components within normal limits  URINALYSIS, ROUTINE W REFLEX MICROSCOPIC - Abnormal; Notable for the following components:   APPearance CLOUDY (*)    Hgb urine dipstick MODERATE (*)    Protein, ur 100 (*)    Leukocytes,Ua LARGE (*)    WBC, UA >50 (*)    Bacteria, UA MANY (*)    All other components within normal limits  LACTIC ACID, PLASMA - Abnormal; Notable for the following components:  Lactic Acid, Venous 2.6 (*)    All other components within normal limits  LACTIC ACID, PLASMA -  Abnormal; Notable for the following components:   Lactic Acid, Venous 2.4 (*)    All other components within normal limits  APTT - Abnormal; Notable for the following components:   aPTT 41 (*)    All other components within normal limits  MAGNESIUM - Abnormal; Notable for the following components:   Magnesium 1.6 (*)    All other components within normal limits  PROTIME-INR - Abnormal; Notable for the following components:   Prothrombin Time 15.7 (*)    INR 1.3 (*)    All other components within normal limits  CBC - Abnormal; Notable for the following components:   WBC 16.7 (*)    RBC 3.50 (*)    Hemoglobin 9.7 (*)    HCT 29.9 (*)    All other components within normal limits  BASIC METABOLIC PANEL - Abnormal; Notable for the following components:   CO2 21 (*)    Glucose, Bld 111 (*)    BUN 39 (*)    Creatinine, Ser 1.47 (*)    Calcium 7.7 (*)    GFR calc non Af Amer 41 (*)    GFR calc Af Amer 47 (*)    All other components within normal limits  BASIC METABOLIC PANEL - Abnormal; Notable for the following components:   CO2 21 (*)    BUN 29 (*)    Calcium 7.8 (*)    GFR calc non Af Amer 50 (*)    GFR calc Af Amer 58 (*)    All other components within normal limits  CBC WITH DIFFERENTIAL/PLATELET - Abnormal; Notable for the following components:   WBC 11.8 (*)    RBC 3.83 (*)    Hemoglobin 10.4 (*)    HCT 31.9 (*)    Neutro Abs 10.7 (*)    Lymphs Abs 0.5 (*)    Abs Immature Granulocytes 0.08 (*)    All other components within normal limits  CBC WITH DIFFERENTIAL/PLATELET - Abnormal; Notable for the following components:   RBC 3.78 (*)    Hemoglobin 10.4 (*)    HCT 31.3 (*)    Lymphs Abs 0.5 (*)    All other components within normal limits  BASIC METABOLIC PANEL - Abnormal; Notable for the following components:   Sodium 147 (*)    Potassium 3.1 (*)    Chloride 115 (*)    Glucose, Bld 141 (*)    Calcium 7.8 (*)    GFR calc non Af Amer 56 (*)    All other components  within normal limits  BASIC METABOLIC PANEL - Abnormal; Notable for the following components:   Sodium 149 (*)    Potassium 3.0 (*)    Chloride 119 (*)    Glucose, Bld 144 (*)    Calcium 7.8 (*)    All other components within normal limits  CBC WITH DIFFERENTIAL/PLATELET - Abnormal; Notable for the following components:   RBC 3.70 (*)    Hemoglobin 10.3 (*)    HCT 30.7 (*)    All other components within normal limits  BASIC METABOLIC PANEL - Abnormal; Notable for the following components:   Sodium 150 (*)    Potassium 3.3 (*)    Chloride 121 (*)    CO2 18 (*)    Glucose, Bld 156 (*)    Calcium 7.8 (*)    All other components within normal limits  RESPIRATORY  PANEL BY RT PCR (FLU A&B, COVID)  MRSA PCR SCREENING  C DIFFICILE QUICK SCREEN W PCR REFLEX  PROTIME-INR  CORTISOL-AM, BLOOD  PROCALCITONIN  LACTIC ACID, PLASMA  MAGNESIUM  MAGNESIUM  LACTIC ACID, PLASMA  PROCALCITONIN  PROCALCITONIN  MAGNESIUM  MAGNESIUM  CBG MONITORING, ED    EKG EKG Interpretation  Date/Time:  Wednesday July 14 2019 00:00:46 EST Ventricular Rate:  143 PR Interval:  174 QRS Duration: 115 QT Interval:  330 QTC Calculation: 509 R Axis:   -82 Text Interpretation: Atrial fibrillation with rapid V-rate Left anterior fascicular block Probable left ventricular hypertrophy When compared with ECG of 08/03/2019, Atrial fibrillation with rapid ventricular response has replaced Sinus rhythm Confirmed by Delora Fuel [id number]) on 07/14/2019 1:24:37 AM Also confirmed by Delora Fuel ([id number]), editor [place], [place] [phone])  on 07/15/2019 12:11:38 PM   Radiology DG CHEST PORT 1 VIEW  Result Date: 2019-07-21 CLINICAL DATA:  86 year old male with shortness of breath EXAM: PORTABLE CHEST 1 VIEW COMPARISON:  Chest radiograph dated 07/23/2019 and CT dated 08/06/2019. FINDINGS: Bilateral mid to lower lung field nodular and streaky hazy densities most consistent with infiltrate. There is background of emphysema.  No focal consolidation, pleural effusion, or pneumothorax. Stable cardiomediastinal silhouette. A loop recorder device noted. Atherosclerotic calcification of the aorta. No acute osseous pathology. IMPRESSION: Bilateral mid to lower lung field nodular and streaky densities most consistent with infiltrate. Overall interval worsening of the airspace disease since the prior radiograph. Continued follow-up recommended. Electronically Signed   By: Anner Crete M.D.   On: July 21, 2019 00:30   DG Abd Portable 1V  Result Date: 2019/07/21 CLINICAL DATA:  Abdominal distension EXAM: PORTABLE ABDOMEN - 1 VIEW COMPARISON:  None. FINDINGS: Large amount of stool within the colon with diffuse distension. No free air is visible. The degree of colonic distension makes it difficult to differentiate from small bowel. IMPRESSION: Large amount of colonic stool with diffuse colonic distension. Electronically Signed   By: Ulyses Jarred M.D.   On: 21-Jul-2019 01:45    Procedures .Critical Care Performed by: Varney Biles, MD Authorized by: Varney Biles, MD   Critical care provider statement:    Critical care time (minutes):  48   Critical care was time spent personally by me on the following activities:  Discussions with consultants, evaluation of patient's response to treatment, examination of patient, ordering and performing treatments and interventions, ordering and review of laboratory studies, ordering and review of radiographic studies, pulse oximetry, re-evaluation of patient's condition, obtaining history from patient or surrogate and review of old charts   (including critical care time)  Medications Ordered in ED Medications  amiodarone (NEXTERONE PREMIX) 360-4.14 MG/200ML-% (1.8 mg/mL) IV infusion (0 mg/hr Intravenous Stopped 07/14/19 0744)  dextrose 5 % solution ( Intravenous Stopped 07/17/19 0825)  sodium chloride flush (NS) 0.9 % injection 3 mL (3 mLs Intravenous Given 07/17/2019 1358)  ceFEPIme (MAXIPIME)  2 g in sodium chloride 0.9 % 100 mL IVPB (0 g Intravenous Stopped 07/17/2019 1537)  metroNIDAZOLE (FLAGYL) IVPB 500 mg (0 mg Intravenous Stopped 07/15/2019 1553)  vancomycin (VANCOCIN) IVPB 1000 mg/200 mL premix (0 mg Intravenous Stopped 07/11/2019 1704)  lactated ringers bolus 1,000 mL (0 mLs Intravenous Stopped 08/04/2019 1537)    And  lactated ringers bolus 1,000 mL (0 mLs Intravenous Stopped 08/05/2019 1537)    And  lactated ringers bolus 250 mL (0 mLs Intravenous Stopped 08/01/2019 1703)  ondansetron (ZOFRAN) injection 4 mg (4 mg Intravenous Given 08/05/2019 1709)  magnesium sulfate IVPB 2 g  50 mL (0 g Intravenous Stopped 07/14/2019 1824)  furosemide (LASIX) injection 20 mg (20 mg Intravenous Given 07/23/2019 1736)  amiodarone (NEXTERONE) IV bolus only 150 mg/100 mL (0 mg Intravenous Stopped 07/14/19 0118)  lactated ringers bolus 1,000 mL (0 mLs Intravenous Stopped 07/14/19 0359)  potassium chloride 10 mEq in 100 mL IVPB (0 mEq Intravenous Stopped 07/14/19 1559)  magnesium sulfate IVPB 2 g 50 mL (2 g Intravenous Transfusing/Transfer 07/14/19 1628)  potassium chloride (KLOR-CON) packet 40 mEq (40 mEq Oral Given 07/16/19 1011)  magnesium sulfate IVPB 2 g 50 mL (0 g Intravenous Stopped 07/16/19 1225)  loperamide HCl (IMODIUM) 1 MG/7.5ML suspension 2 mg (2 mg Oral Given 07/16/19 1203)  potassium chloride (KLOR-CON) packet 40 mEq (40 mEq Per Tube Given 07/17/19 0900)  potassium chloride (KLOR-CON) packet 40 mEq (40 mEq Oral Given 07/17/19 1710)  furosemide (LASIX) injection 60 mg (60 mg Intravenous Given 08-10-19 0002)    ED Course  I have reviewed the triage vital signs and the nursing notes.  Pertinent labs & imaging results that were available during my care of the patient were reviewed by me and considered in my medical decision making (see chart for details).    MDM Rules/Calculators/A&P                      86 year old comes in a chief complaint of weakness. He is noted to be hypotensive.  According to the nursing home patient is  having diarrhea.  Differential diagnosis includes C. difficile colitis leading to diarrhea and hypovolemia.  Other possibilities include severe electrolyte abnormality.  He is hypotensive.  Code sepsis will be activated to resuscitate. Broad-spectrum antibiotics to be given.  Questionable sepsis at this time.  Final Clinical Impression(s) / ED Diagnoses Final diagnoses:  CHF (congestive heart failure) ([place])  Shortness of breath  Abdominal distension    Rx / DC Orders ED Discharge Orders    None       Varney Biles, MD 07/16/2019 [place], [place], MD 07/19/19 1539

## 2019-07-13 NOTE — Progress Notes (Addendum)
Pharmacy Antibiotic Note  Joe Glover is a 86 y.o. male admitted on 07/15/2019 with sepsis.  Patient presented with generalized weakness. Of note, patient received COVID vaccine yesterday. Significant labs: Patients WBC is elevated at 21 and Scr is elevated from baseline at 1.46. Pharmacy has been consulted for Vancomycin and Cefepime dosing.  Plan: Give cefepime 2 gm IV x1 dose; then start cefepime 1 gm IV q24hr  Give Vancomycin load of 1000 mg IV x1 dose; then start Vancomycin 750 mg IV q24h   - Goal AUC 400-550.   - Expected AUC: 519   - SCr used: 1.46 Monitor clinical improvement, renal function and obtain vancomycin levels as needed   Temp (24hrs), Avg:97.7 F (36.5 C), Min:97.7 F (36.5 C), Max:97.7 F (36.5 C)  Recent Labs  Lab 07/20/2019 1111  WBC 21.0*  CREATININE 1.46*    Estimated Creatinine Clearance: 30.7 mL/min (A) (by C-G formula based on SCr of 1.46 mg/dL (H)).    Allergies  Allergen Reactions  . Aspirin Other (See Comments)    GI bleed  . Augmentin [Amoxicillin-Pot Clavulanate] Hives  . Hemocyte Plus [Hematinic Plus Vit-Minerals] Other (See Comments)    Unknown reaction  . Penicillins Hives    Has patient had a PCN reaction causing immediate rash, facial/tongue/throat swelling, SOB or lightheadedness with hypotension: No Has patient had a PCN reaction causing severe rash involving mucus membranes or skin necrosis: Yes Has patient had a PCN reaction that required hospitalization: No Has patient had a PCN reaction occurring within the last 10 years: No If all of the above answers are "NO", then may proceed with Cephalosporin use.    Antimicrobials this admission: 1/5 Vancomycin >>  1/5 Cefepime >>  1/5 Metronidazole x1 dose   Dose adjustments this admission: none  Microbiology results: 1/5 BCx:  1/5 UCx:   Thank you for allowing pharmacy to be a part of this patient's care.  Acey Lav, PharmD  PGY1 Acute Care Pharmacy  Resident [phone] 07/11/2019 1:47 PM

## 2019-07-13 NOTE — Progress Notes (Signed)
EDCSW received call from [place] from [place] regarding Pt status. CSW updated her with admission status.

## 2019-07-14 ENCOUNTER — Encounter (HOSPITAL_COMMUNITY): Payer: Self-pay | Admitting: Internal Medicine

## 2019-07-14 DIAGNOSIS — G9341 Metabolic encephalopathy: Secondary | ICD-10-CM

## 2019-07-14 DIAGNOSIS — I10 Essential (primary) hypertension: Secondary | ICD-10-CM

## 2019-07-14 DIAGNOSIS — R7881 Bacteremia: Secondary | ICD-10-CM | POA: Diagnosis present

## 2019-07-14 DIAGNOSIS — G20 Parkinson's disease: Secondary | ICD-10-CM

## 2019-07-14 DIAGNOSIS — J9601 Acute respiratory failure with hypoxia: Secondary | ICD-10-CM

## 2019-07-14 DIAGNOSIS — I951 Orthostatic hypotension: Secondary | ICD-10-CM

## 2019-07-14 DIAGNOSIS — A419 Sepsis, unspecified organism: Secondary | ICD-10-CM

## 2019-07-14 DIAGNOSIS — I480 Paroxysmal atrial fibrillation: Secondary | ICD-10-CM

## 2019-07-14 DIAGNOSIS — B9620 Unspecified Escherichia coli [E. coli] as the cause of diseases classified elsewhere: Secondary | ICD-10-CM

## 2019-07-14 DIAGNOSIS — J690 Pneumonitis due to inhalation of food and vomit: Secondary | ICD-10-CM

## 2019-07-14 DIAGNOSIS — N390 Urinary tract infection, site not specified: Secondary | ICD-10-CM

## 2019-07-14 DIAGNOSIS — R6520 Severe sepsis without septic shock: Secondary | ICD-10-CM

## 2019-07-14 HISTORY — DX: Sepsis, unspecified organism: A41.9

## 2019-07-14 LAB — BASIC METABOLIC PANEL
Anion gap: 11 (ref 5–15)
BUN: 39 mg/dL — ABNORMAL HIGH (ref 8–23)
CO2: 21 mmol/L — ABNORMAL LOW (ref 22–32)
Calcium: 7.7 mg/dL — ABNORMAL LOW (ref 8.9–10.3)
Chloride: 103 mmol/L (ref 98–111)
Creatinine, Ser: 1.47 mg/dL — ABNORMAL HIGH (ref 0.61–1.24)
GFR calc Af Amer: 47 mL/min — ABNORMAL LOW (ref >60)
GFR calc non Af Amer: 41 mL/min — ABNORMAL LOW (ref >60)
Glucose, Bld: 111 mg/dL — ABNORMAL HIGH (ref 70–99)
Potassium: 3.5 mmol/L (ref 3.5–5.1)
Sodium: 135 mmol/L (ref 135–145)

## 2019-07-14 LAB — BLOOD CULTURE ID PANEL (REFLEXED)

## 2019-07-14 LAB — CBC
HCT: 29.9 % — ABNORMAL LOW (ref 39.0–52.0)
Hemoglobin: 9.7 g/dL — ABNORMAL LOW (ref 13.0–17.0)
MCH: 27.7 pg (ref 26.0–34.0)
MCHC: 32.4 g/dL (ref 30.0–36.0)
MCV: 85.4 fL (ref 80.0–100.0)
Platelets: 197 10*3/uL (ref 150–400)
RBC: 3.5 MIL/uL — ABNORMAL LOW (ref 4.22–5.81)
RDW: 15 % (ref 11.5–15.5)
WBC: 16.7 10*3/uL — ABNORMAL HIGH (ref 4.0–10.5)
nRBC: 0 % (ref 0.0–0.2)

## 2019-07-14 LAB — MAGNESIUM: Magnesium: 1.8 mg/dL (ref 1.7–2.4)

## 2019-07-14 LAB — LACTIC ACID, PLASMA: Lactic Acid, Venous: 1.2 mmol/L (ref 0.5–1.9)

## 2019-07-14 LAB — PROTIME-INR
INR: 1.3 — ABNORMAL HIGH (ref 0.8–1.2)
Prothrombin Time: 15.7 seconds — ABNORMAL HIGH (ref 11.4–15.2)

## 2019-07-14 LAB — CORTISOL-AM, BLOOD: Cortisol - AM: 17.3 ug/dL (ref 6.7–22.6)

## 2019-07-14 LAB — PROCALCITONIN: Procalcitonin: 28.6 ng/mL

## 2019-07-14 MED ORDER — POTASSIUM CHLORIDE 10 MEQ/100ML IV SOLN
10.0000 meq | INTRAVENOUS | Status: AC
  Administered 2019-07-14 (×3): 10 meq via INTRAVENOUS
  Filled 2019-07-14 (×3): qty 100

## 2019-07-14 MED ORDER — AMIODARONE IV BOLUS ONLY 150 MG/100ML
150.0000 mg | Freq: Once | INTRAVENOUS | Status: AC
  Administered 2019-07-14: 01:00:00 150 mg via INTRAVENOUS
  Filled 2019-07-14: qty 100

## 2019-07-14 MED ORDER — LACTATED RINGERS IV BOLUS
1000.0000 mL | Freq: Once | INTRAVENOUS | Status: AC
  Administered 2019-07-14: 1000 mL via INTRAVENOUS

## 2019-07-14 MED ORDER — MAGNESIUM SULFATE 2 GM/50ML IV SOLN
2.0000 g | Freq: Once | INTRAVENOUS | Status: AC
  Administered 2019-07-14: 16:00:00 2 g via INTRAVENOUS
  Filled 2019-07-14: qty 50

## 2019-07-14 MED ORDER — CHLORHEXIDINE GLUCONATE 0.12 % MT SOLN
15.0000 mL | Freq: Two times a day (BID) | OROMUCOSAL | Status: DC
  Administered 2019-07-14 – 2019-07-17 (×6): 15 mL via OROMUCOSAL
  Filled 2019-07-14 (×6): qty 15

## 2019-07-14 MED ORDER — IPRATROPIUM-ALBUTEROL 0.5-2.5 (3) MG/3ML IN SOLN: 3.0000 mL | RESPIRATORY_TRACT | Status: DC | PRN

## 2019-07-14 MED ORDER — AMIODARONE HCL IN DEXTROSE 360-4.14 MG/200ML-% IV SOLN
60.0000 mg/h | INTRAVENOUS | Status: AC
  Administered 2019-07-14: 01:00:00 60 mg/h via INTRAVENOUS
  Filled 2019-07-14: qty 200

## 2019-07-14 MED ORDER — POTASSIUM CHLORIDE 20 MEQ PO PACK
40.0000 meq | PACK | Freq: Once | ORAL | Status: DC
  Filled 2019-07-14: qty 2

## 2019-07-14 MED ORDER — SODIUM CHLORIDE 0.9 % IV SOLN
2.0000 g | INTRAVENOUS | Status: DC
  Administered 2019-07-14: 2 g via INTRAVENOUS
  Filled 2019-07-14 (×2): qty 2

## 2019-07-14 MED ORDER — ORAL CARE MOUTH RINSE
15.0000 mL | Freq: Two times a day (BID) | OROMUCOSAL | Status: DC
  Administered 2019-07-14 – 2019-07-17 (×5): 15 mL via OROMUCOSAL

## 2019-07-14 MED ORDER — AMIODARONE HCL IN DEXTROSE 360-4.14 MG/200ML-% IV SOLN
30.0000 mg/h | INTRAVENOUS | Status: DC
  Administered 2019-07-14: 30 mg/h via INTRAVENOUS
  Filled 2019-07-14: qty 200

## 2019-07-14 NOTE — ED Notes (Signed)
Pt sating 97% on 2L [place], titrated down to 1L.

## 2019-07-14 NOTE — Progress Notes (Signed)
PROGRESS NOTE    Joe Glover  [id number] DOB: 1924/12/16 DOA: 07/25/2019 PCP: [name], MD   Brief Narrative:  HPI per Dr. [name] is a 86 y.o. male with medical history significant of paroxysmal atrial fibrillation, sick sinus syndrome, orthostatic hypotension and recurrent syncope, chronic hyponatremia, Parkinson's disease with autonomic dysfunction, dementia,hyperlipidemia, anemia, OSA, prostate cancer, subdural hematoma, subarachnoid hemorrhage, CVAwith residual dysarthria, and conditions listed below presenting to the ED for evaluation for increasing weakness.  Recently hospitalized for syncopal episode, found to have orthostatic hypotension secondary to advanced Parkinson disease, for which patient was started on midodrine also UTI-patient was discharged with Cipro.  Patient wife was at bedside, reported that patient has had worsening of weakness, and has been on pured diet but according to the wife who has been feeding the patient patient has had frequent cough even choking after eating.  Patient has had multiple episodes of loose diarrhea every day but denied any abdominal pain.  Patient wife reported she did not put the diarrhea is C. Difficile (she is confident of the smell of C. Difficile).  Wife denies and had any fever or chills at nursing home. ED Course: he was found to be hypotensive and hypoxia, code sepsis was called and patient was started on vancomycin and cefepime, 3 L IV fluid bolus was given and patient became hypertensive, tachycardia and wheezing.   Assessment & Plan:   Principal Problem:   Severe sepsis with acute organ dysfunction (HCC) Active Problems:   E coli bacteremia   Aspiration pneumonia (HCC)   Acute metabolic encephalopathy   OSA (obstructive sleep apnea)   Parkinson disease (HCC)   Essential hypertension   Orthostatic hypotension   PAF (paroxysmal atrial fibrillation) (HCC)   Sick sinus syndrome ([place])   Sepsis  ([place])  #1 severe sepsis with acute organ dysfunction likely secondary to E. Coli bacteremia/UTI/aspiration pneumonia Patient had presented to the ED due to increasing weakness, noted to be lethargic on admission and noted to be hypotensive and noted to have a fever with temperature as high as 102, lactic acid elevated at 2.6 on admission with a leukocytosis with a white count of 21, CT chest worrisome for pneumonia, urinalysis with large leukocytes, greater than 50 WBCs, many bacteria.  Blood cultures preliminary positive for E. coli.  Urine cultures pending.  Patient noted to be hypotensive received fluid bolus with some wheezing and worsening shortness of breath.  Continue empiric IV vancomycin, IV cefepime, IV Flagyl.  SLP evaluation pending.  Follow.  2.  Orthostatic hypotension Patient noted to have a history of orthostatic hypotension.  Patient on presentation with systolic blood pressures in the 70s.  Patient given a fluid bolus however supposedly had some wheezing and shortness of breath which has since improved.  Continue midodrine.  3.  Acute metabolic encephalopathy Likely secondary to problem #1.  Patient more alert today than on admission per wife who is at bedside.  Continue empiric IV antibiotics as in problem #1.  4.  Dysphagia Patient with dysphagia noted to be on pured diet per wife at home however per RN wife states patient has been having some episodes of choking even with a pured diet.  SLP evaluation.  Follow.  5.  Parkinson's disease Outpatient follow-up.  6.  Paroxysmal atrial fibrillation Patient in sinus rhythm.  Patient noted to be tachycardic and due to dysphagia and mental status patient was started on amiodarone drip.  Heart rate seems controlled.  Advised nurse to wean down  amiodarone drip to off.  Patient a poor anticoagulation candidate due to frequent falls and history of GI bleed and intracranial bleed.  Outpatient follow-up with cardiology.  7.  Generalized  weakness PT/OT.  8.  Prognosis Patient with multiple comorbidities, advanced age of 36, recent hospitalization for UTI, dysphagia now presenting with severe sepsis secondary to E. coli bacteremia, UTI, aspiration pneumonia.  Palliative care consultation for goals of care.   DVT prophylaxis: SCDs Code Status: DNR Family Communication: Updated wife at bedside. Disposition Plan: To be determined.   Consultants:   Palliative care pending  Procedures:   CT chest 08/06/2019  Chest x-ray 08/03/2019    Antimicrobials:  IV cefepime 07/17/2019  IV vancomycin 07/26/2019  IV Flagyl 07/22/2019   Subjective: Patient noted to be minimally responsive overnight however opens eyes to verbal stimuli today and denied any shortness of breath or chest pain.  Wife at bedside.  Objective: Vitals:   07/14/19 0900 07/14/19 0915 07/14/19 0930 07/14/19 0945  BP: 107/71 106/62 107/72 100/75  Pulse: 75 70 75 74  Resp: 18 18 17 16   Temp:      TempSrc:      SpO2: 94% 93% 96% 95%  Weight:      Height:        Intake/Output Summary (Last 24 hours) at 07/14/2019 1124 Last data filed at 07/14/2019 0744 Gross per 24 hour  Intake 2900 ml  Output 400 ml  Net 2500 ml   Filed Weights   07/15/2019 1704  Weight: 66.2 kg    Examination:  General exam: Frail.  Chronically ill-appearing.  Extremely dry mucous membranes. Respiratory system: Right coarse breath sounds in the base.  No wheezing.  Normal respiratory effort.   Cardiovascular system: S1 & S2 heard, RRR. No JVD, murmurs, rubs, gallops or clicks. No pedal edema. Gastrointestinal system: Abdomen is nondistended, soft and nontender. No organomegaly or masses felt. Normal bowel sounds heard. Central nervous system: Alert and oriented. No focal neurological deficits. Extremities: Symmetric 5 x 5 power. Skin: No rashes, lesions or ulcers Psychiatry: Judgement and insight appear poor to fair. Mood & affect appropriate.     Data Reviewed: I have  personally reviewed following labs and imaging studies  CBC: Recent Labs  Lab 07/24/2019 1111 07/14/19 0600  WBC 21.0* 16.7*  HGB 10.9* 9.7*  HCT 33.0* 29.9*  MCV 84.6 85.4  PLT 289 [id number]   Basic Metabolic Panel: Recent Labs  Lab 07/26/2019 1111 07/10/2019 1400 07/14/19 0600  NA 135  --  135  K 3.8  --  3.5  CL 101  --  103  CO2 21*  --  21*  GLUCOSE 131*  --  111*  BUN 31*  --  39*  CREATININE 1.46*  --  1.47*  CALCIUM 8.3*  --  7.7*  MG  --  1.6*  --    GFR: Estimated Creatinine Clearance: 30 mL/min (A) (by C-G formula based on SCr of 1.47 mg/dL (H)). Liver Function Tests: No results for input(s): AST, ALT, ALKPHOS, BILITOT, PROT, ALBUMIN in the last 168 hours. No results for input(s): LIPASE, AMYLASE in the last 168 hours. No results for input(s): AMMONIA in the last 168 hours. Coagulation Profile: Recent Labs  Lab 07/24/2019 1400 07/14/19 0600  INR 1.2 1.3*   Cardiac Enzymes: No results for input(s): CKTOTAL, CKMB, CKMBINDEX, TROPONINI in the last 168 hours. BNP (last 3 results) No results for input(s): PROBNP in the last 8760 hours. HbA1C: No results for input(s): HGBA1C in  the last 72 hours. CBG: No results for input(s): GLUCAP in the last 168 hours. Lipid Profile: No results for input(s): CHOL, HDL, LDLCALC, TRIG, CHOLHDL, LDLDIRECT in the last 72 hours. Thyroid Function Tests: No results for input(s): TSH, T4TOTAL, FREET4, T3FREE, THYROIDAB in the last 72 hours. Anemia Panel: No results for input(s): VITAMINB12, FOLATE, FERRITIN, TIBC, IRON, RETICCTPCT in the last 72 hours. Sepsis Labs: Recent Labs  Lab 07/20/2019 1344 07/29/2019 1559 07/14/19 0600  PROCALCITON  --   --  28.60  LATICACIDVEN 2.6* 2.4* 1.2    Recent Results (from the past 240 hour(s))  SARS CORONAVIRUS 2 (TAT 6-24 HRS) Nasopharyngeal Nasopharyngeal Swab     Status: None   Collection Time: 07/04/19  6:30 PM   Specimen: Nasopharyngeal Swab  Result Value Ref Range Status   SARS Coronavirus  2 NEGATIVE NEGATIVE Final    Comment: (NOTE) SARS-CoV-2 target nucleic acids are NOT DETECTED. The SARS-CoV-2 RNA is generally detectable in upper and lower respiratory specimens during the acute phase of infection. Negative results do not preclude SARS-CoV-2 infection, do not rule out co-infections with other pathogens, and should not be used as the sole basis for treatment or other patient management decisions. Negative results must be combined with clinical observations, patient history, and epidemiological information. The expected result is Negative. Fact Sheet for Patients: SugarRoll.be Fact Sheet for Healthcare Providers: https://www.woods-mathews.com/ This test is not yet approved or cleared by the [place] FDA and  has been authorized for detection and/or diagnosis of SARS-CoV-2 by FDA under an Emergency Use Authorization (EUA). This EUA will remain  in effect (meaning this test can be used) for the duration of the COVID-19 declaration under Section 56 4(b)(1) of the Act, 21 U.S.C. section 360bbb-3(b)(1), unless the authorization is terminated or revoked sooner. Performed at [facility], [place] [address] [place], [place] [address]   Blood Culture (routine x 2)     Status: None (Preliminary result)   Collection Time: 07/21/2019  1:42 PM   Specimen: BLOOD  Result Value Ref Range Status   Specimen Description BLOOD LEFT ANTECUBITAL  Final   Special Requests   Final    BOTTLES DRAWN AEROBIC AND ANAEROBIC Blood Culture adequate volume   Culture  Setup Time   Final    GRAM NEGATIVE RODS IN BOTH AEROBIC AND ANAEROBIC BOTTLES Organism ID to follow CRITICAL RESULT CALLED TO, READ BACK BY AND VERIFIED WITH: J.LEDFORD,PHARMD AT 0557 ON 07/14/19 BY G.[email or web address] Performed at [facility], [place] [address], [place], [place] [address]    Culture PENDING  Incomplete   Report Status PENDING  Incomplete  Blood Culture ID Panel (Reflexed)      Status: Abnormal   Collection Time: 07/27/2019  1:42 PM  Result Value Ref Range Status   Enterococcus species NOT DETECTED NOT DETECTED Final   Listeria monocytogenes NOT DETECTED NOT DETECTED Final   Staphylococcus species NOT DETECTED NOT DETECTED Final   Staphylococcus aureus (BCID) NOT DETECTED NOT DETECTED Final   Streptococcus species NOT DETECTED NOT DETECTED Final   Streptococcus agalactiae NOT DETECTED NOT DETECTED Final   Streptococcus pneumoniae NOT DETECTED NOT DETECTED Final   Streptococcus pyogenes NOT DETECTED NOT DETECTED Final   Acinetobacter baumannii NOT DETECTED NOT DETECTED Final   Enterobacteriaceae species DETECTED (A) NOT DETECTED Final    Comment: Enterobacteriaceae represent a large family of gram-negative bacteria, not a single organism. CRITICAL RESULT CALLED TO, READ BACK BY AND VERIFIED WITH: J.LEDFORD,PHARMD AT 0557 ON 07/14/19 BY  G.MCADOO    Enterobacter cloacae complex NOT DETECTED NOT DETECTED Final   Escherichia coli DETECTED (A) NOT DETECTED Final    Comment: CRITICAL RESULT CALLED TO, READ BACK BY AND VERIFIED WITH: J.LEDFORD,PHARMD AT 0557 ON 07/14/19 BY G.MCADOO    Klebsiella oxytoca NOT DETECTED NOT DETECTED Final   Klebsiella pneumoniae NOT DETECTED NOT DETECTED Final   Proteus species NOT DETECTED NOT DETECTED Final   Serratia marcescens NOT DETECTED NOT DETECTED Final   Carbapenem resistance NOT DETECTED NOT DETECTED Final   Haemophilus influenzae NOT DETECTED NOT DETECTED Final   Neisseria meningitidis NOT DETECTED NOT DETECTED Final   Pseudomonas aeruginosa NOT DETECTED NOT DETECTED Final   Candida albicans NOT DETECTED NOT DETECTED Final   Candida glabrata NOT DETECTED NOT DETECTED Final   Candida krusei NOT DETECTED NOT DETECTED Final   Candida parapsilosis NOT DETECTED NOT DETECTED Final   Candida tropicalis NOT DETECTED NOT DETECTED Final    Comment: Performed at [facility], [place] [address], [place], [place] [address]  Blood  Culture (routine x 2)     Status: None (Preliminary result)   Collection Time: 08/01/2019  2:12 PM   Specimen: BLOOD LEFT FOREARM  Result Value Ref Range Status   Specimen Description BLOOD LEFT FOREARM  Final   Special Requests   Final    BOTTLES DRAWN AEROBIC AND ANAEROBIC Blood Culture results may not be optimal due to an inadequate volume of blood received in culture bottles   Culture  Setup Time   Final    GRAM NEGATIVE RODS ANAEROBIC BOTTLE ONLY CRITICAL VALUE NOTED.  VALUE IS CONSISTENT WITH PREVIOUSLY REPORTED AND CALLED VALUE. Performed at [facility], [place] [address] [place], [place] [address]    Culture PENDING  Incomplete   Report Status PENDING  Incomplete  Respiratory Panel by RT PCR (Flu A&B, Covid) - Nasopharyngeal Swab     Status: None   Collection Time: 07/20/2019  6:29 PM   Specimen: Nasopharyngeal Swab  Result Value Ref Range Status   SARS Coronavirus 2 by RT PCR NEGATIVE NEGATIVE Final    Comment: (NOTE) SARS-CoV-2 target nucleic acids are NOT DETECTED. The SARS-CoV-2 RNA is generally detectable in upper respiratoy specimens during the acute phase of infection. The lowest concentration of SARS-CoV-2 viral copies this assay can detect is 131 copies/mL. A negative result does not preclude SARS-Cov-2 infection and should not be used as the sole basis for treatment or other patient management decisions. A negative result may occur with  improper specimen collection/handling, submission of specimen other than nasopharyngeal swab, presence of viral mutation(s) within the areas targeted by this assay, and inadequate number of viral copies (<131 copies/mL). A negative result must be combined with clinical observations, patient history, and epidemiological information. The expected result is Negative. Fact Sheet for Patients:  PinkCheek.be Fact Sheet for Healthcare Providers:  GravelBags.it This test is not  yet ap proved or cleared by the [place] FDA and  has been authorized for detection and/or diagnosis of SARS-CoV-2 by FDA under an Emergency Use Authorization (EUA). This EUA will remain  in effect (meaning this test can be used) for the duration of the COVID-19 declaration under Section 564(b)(1) of the Act, 21 U.S.C. section 360bbb-3(b)(1), unless the authorization is terminated or revoked sooner.    Influenza A by PCR NEGATIVE NEGATIVE Final   Influenza B by PCR NEGATIVE NEGATIVE Final    Comment: (NOTE) The Xpert Xpress SARS-CoV-2/FLU/RSV assay is intended as an aid  in  the diagnosis of influenza from Nasopharyngeal swab specimens and  should not be used as a sole basis for treatment. Nasal washings and  aspirates are unacceptable for Xpert Xpress SARS-CoV-2/FLU/RSV  testing. Fact Sheet for Patients: PinkCheek.be Fact Sheet for Healthcare Providers: GravelBags.it This test is not yet approved or cleared by the [place] FDA and  has been authorized for detection and/or diagnosis of SARS-CoV-2 by  FDA under an Emergency Use Authorization (EUA). This EUA will remain  in effect (meaning this test can be used) for the duration of the  Covid-19 declaration under Section 564(b)(1) of the Act, 21  U.S.C. section 360bbb-3(b)(1), unless the authorization is  terminated or revoked. Performed at [facility], [place] [address] [place], [place] [address]          Radiology Studies: DG Chest 1 View  Result Date: 08/05/2019 CLINICAL DATA:  Decreased oxygen saturation. EXAM: CHEST  1 VIEW COMPARISON:  July 13, 2019 FINDINGS: Mildly increased lung markings are seen with very mild, stable left basilar atelectasis. There is no evidence of a pleural effusion or pneumothorax. The cardiac silhouette is borderline in size. There is moderate severity calcification thoracic aorta. The visualized skeletal structures are  unremarkable. IMPRESSION: 1. Very mild, stable left basilar atelectasis. Electronically Signed   By: Virgina Norfolk M.D.   On: 07/30/2019 18:16   CT CHEST WO CONTRAST  Result Date: 08/02/2019 CLINICAL DATA:  Hypotension and wheezing. EXAM: CT CHEST WITHOUT CONTRAST TECHNIQUE: Multidetector CT imaging of the chest was performed following the standard protocol without IV contrast. COMPARISON:  Chest x-ray from same day. CTA chest dated January 22, 2017. FINDINGS: Cardiovascular: Normal heart size. No pericardial effusion. Unchanged mild aneurysmal dilatation of the ascending thoracic aorta measuring up to 4.0 cm. Coronary, aortic arch, and branch vessel atherosclerotic vascular disease. Mediastinum/Nodes: No enlarged mediastinal or axillary lymph nodes. Thyroid gland, trachea, and esophagus demonstrate no significant findings. Lungs/Pleura: Bilateral lower lobe peribronchial thickening with small airways impaction and patchy ground-glass densities and subsegmental atelectasis in both lower lobes. Areas of centrilobular nodularity in the left lower lobe. Upper lobe predominant paraseptal and centrilobular emphysema. Unchanged 5 mm nodule in the right upper lobe (series 5, image 40), stable since 2018, benign. No pleural effusion or pneumothorax. Upper Abdomen: No acute abnormality. Musculoskeletal: No chest wall mass or suspicious bone lesions identified. IMPRESSION: 1. Bilateral lower lobe peribronchial thickening with small airways impaction, patchy ground-glass density, and centrilobular nodularity, suspicious for aspiration. 2. Unchanged ascending thoracic aortic aneurysm measuring 4.0 cm. 3.  Emphysema (ICD10-J43.9). 4.  Aortic atherosclerosis (ICD10-I70.0). Electronically Signed   By: Titus Dubin M.D.   On: 07/19/2019 21:31   DG Chest Port 1 View  Result Date: 07/30/2019 CLINICAL DATA:  Cough. EXAM: PORTABLE CHEST 1 VIEW COMPARISON:  06/28/2019. FINDINGS: Mediastinum hilar structures normal. Cardiac  monitoring device again noted. Stable cardiomegaly. Mild left base atelectasis/infiltrate. No pleural effusion or pneumothorax. Degenerative change thoracic spine. IMPRESSION: 1.  Stable cardiomegaly. 2.  Mild left base atelectasis/infiltrate. Electronically Signed   By: Marcello Moores  Register   On: 07/25/2019 14:20        Scheduled Meds: . acidophilus   Oral QHS  . cholecalciferol  1,000 Units Oral BID WC   And  . cholecalciferol  1,000 Units Oral Q lunch  . [START ON 07/15/2019] cyanocobalamin  1,000 mcg Intramuscular Q30 days  . ferrous sulfate  325 mg Oral TID WC  . ipratropium  1 spray Nasal BID  . ipratropium  0.5 mg Inhalation Q4H  . loratadine  10 mg Oral QHS  . Melatonin  3 mg Oral QHS  . metoprolol tartrate  2.5 mg Intravenous Once  . midodrine  2.5 mg Oral Daily  . pantoprazole  40 mg Oral Daily  . polyvinyl alcohol  1 drop Both Eyes BID  . pravastatin  40 mg Oral QHS  . sodium chloride  1 g Oral Daily   Continuous Infusions: . amiodarone 30 mg/hr (07/14/19 0738)  . ceFEPime (MAXIPIME) IV    . metronidazole Stopped (07/14/19 0742)  . potassium chloride    . vancomycin       LOS: 1 day    Time spent: 40 minutes    Irine Seal, MD [facility]  If 7PM-7AM, please contact night-coverage [email or web address] 07/14/2019, 11:24 AM

## 2019-07-14 NOTE — ED Notes (Signed)
Pt rolled to ensure he was clean and dry. Pt remains clean and dry, condom cath intact and emptied 400CC of yellow cloudy urine. Placed in new brief. Wife at bedside, provide recliner for comfort.

## 2019-07-14 NOTE — ED Notes (Signed)
This RN and Cherylann Ratel RN at bedside, wife at bedside. Wife states that pt has been having increasing difficulty swallowing and that she has to help him eat. She states that most of what she puts in his mouth "runs out". Pt sleeping with mouth agape at this time.

## 2019-07-14 NOTE — Evaluation (Signed)
Clinical/Bedside Swallow Evaluation Patient Details  Name: Joe Glover MRN: [id number] Date of Birth: 04-21-1925  Today's Date: 07/14/2019 Time: SLP Start Time (ACUTE ONLY): 38 SLP Stop Time (ACUTE ONLY): 1540 SLP Time Calculation (min) (ACUTE ONLY): 33 min  Past Medical History:  Past Medical History:  Diagnosis Date  . Awareness alteration, transient 11/02/2014  . Cellulitis of right hand 01/27/2017  . Colon cancer ([place])   . Essential hypertension 09/23/2012  . Fall 09/16/2016  . Fall from standing 02/25/2017  . GI bleed   . Hyperlipidemia   . Hypertension   . Insomnia 08/13/2016  . Normocytic anemia 01/27/2017  . On amiodarone therapy 08/06/2015  . Orthostatic hypotension 08/06/2015  . OSA (obstructive sleep apnea)   . PAF (paroxysmal atrial fibrillation) ([place])   . Parkinson disease ([place])   . Prostate cancer ([place])   . SDH (subdural hematoma) ([place]) 09/16/2016  . Status post placement of implantable loop recorder 07/11/2015  . Stroke ([place])   . 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 Subdural hematoma ([place]) 09/05/2016  . Syncope 11/02/2014  . TIA (transient ischemic attack)   . Vitamin D deficiency    Past Surgical History:  Past Surgical History:  Procedure Laterality Date  . COLECTOMY    . HERNIA REPAIR    . ORCHIECTOMY    . PROSTATECTOMY    . TOTAL HIP ARTHROPLASTY Right 11/11/2018   Procedure: TOTAL HIP ARTHROPLASTY ANTERIOR APPROACH;  Surgeon: Gaynelle Arabian, MD;  Location: [facility];  Service: Orthopedics;  Laterality: Right;  100   HPI:  Joe Glover is a 86 y.o. male with medical history significant of left intraparenchymal hemorrhage 06/18/19, Parkinson's with autonomic dysfunction, paroxysmal atrial fibrillation, sick sinus syndrome, orthostatic hypotension, pseudogout, subdural hematoma, hypertension difficult to manage secondary to intermittent orthostatic hypotension, history of  frequent falls, sleep apnea. Admitted with generalized weakness, on puree diet with frequent coughing per wife. CXR Bilateral lower lobe peribronchial thickening with small airways impaction, patchy ground-glass density, and centrilobular nodularity, suspicious for aspiration. BSE 06/18/19 Per eval pt receives Botox injections, + cough with thin, tolerated nectar, ferquent belching. No indications pna, knowledgeable wife re: strategies, regular/thin recommended.    Assessment / Plan / Recommendation Clinical Impression  Pt seen in ER with wife present who states swallowing has declined over past few weeks and she and pt endorse frequent coughing with food/liquid. Oral examination revealed severe xerostomia and significant diffuse candidia on soft palate. Removed hardened medicine residue on hard palate. Removal was painful for pt. + s/s aspiration with thin including wet vocal quality, coughing and throat clearing. He will need instrumental assessment due to high potential of aspiration given clinical presentation and however recommend ice chips and separate sips water ONLY to moisten oral cavity and MBS tomorrow. Would benefit from possible meds/swab rinse for candidia.   SLP Visit Diagnosis: Dysphagia, unspecified (R13.10)    Aspiration Risk  Severe aspiration risk;Moderate aspiration risk    Diet Recommendation Other (Comment)(sips water only ice chips)   Liquid Administration via: Cup    Other  Recommendations Oral Care Recommendations: Oral care QID   Follow up Recommendations Other (comment)(TBA)      Frequency and Duration min 2x/week  2 weeks       Prognosis Prognosis for Safe Diet Advancement: Fair      Swallow Study   General HPI: Joe Glover is a 86 y.o. male with medical history significant  of left intraparenchymal hemorrhage 06/18/19, Parkinson's with autonomic dysfunction, paroxysmal atrial fibrillation, sick sinus syndrome, orthostatic hypotension, pseudogout, subdural  hematoma, hypertension difficult to manage secondary to intermittent orthostatic hypotension, history of frequent falls, sleep apnea. Admitted with generalized weakness, on puree diet with frequent coughing per wife. CXR Bilateral lower lobe peribronchial thickening with small airways impaction, patchy ground-glass density, and centrilobular nodularity, suspicious for aspiration. BSE 06/18/19 Per eval pt receives Botox injections, + cough with thin, tolerated nectar, ferquent belching. No indications pna, knowledgeable wife re: strategies, regular/thin recommended.  Type of Study: Bedside Swallow Evaluation Previous Swallow Assessment: (see HPI) Diet Prior to this Study: NPO Temperature Spikes Noted: No Respiratory Status: Room air History of Recent Intubation: No Behavior/Cognition: Alert;Cooperative;Pleasant mood Oral Cavity Assessment: Dry;Dried secretions;Other (comment)(significant candidia) Oral Care Completed by SLP: Yes Oral Cavity - Dentition: Adequate natural dentition Vision: Functional for self-feeding Self-Feeding Abilities: Needs assist Patient Positioning: Upright in bed Baseline Vocal Quality: Normal Volitional Cough: Weak Volitional Swallow: Able to elicit    Oral/Motor/Sensory Function Overall Oral Motor/Sensory Function: Generalized oral weakness   Ice Chips Ice chips: Impaired Presentation: Spoon Pharyngeal Phase Impairments: Wet Vocal Quality;Throat Clearing - Immediate   Thin Liquid Thin Liquid: Impaired Presentation: Cup;Spoon Pharyngeal  Phase Impairments: Decreased hyoid-laryngeal movement;Wet Vocal Quality;Cough - Immediate;Throat Clearing - Delayed    Nectar Thick Nectar Thick Liquid: Not tested   Honey Thick Honey Thick Liquid: Not tested   Puree Puree: Not tested   Solid     Solid: Not tested      Houston Siren 07/14/2019,5:12 PM  Orbie Pyo Colvin Caroli.Ed Risk analyst [phone] Office [phone]

## 2019-07-14 NOTE — ED Notes (Signed)
This RN spoke with Lattie Haw in speech therapy, pt is on list for swallow evaluation. Recommends nothing by mouth until pt is evaluated by speech therapy.

## 2019-07-14 NOTE — Progress Notes (Signed)
PHARMACY - PHYSICIAN COMMUNICATION CRITICAL VALUE ALERT - BLOOD CULTURE IDENTIFICATION (BCID)  Joe Glover is an 86 y.o. male who presented to [facility] on 07/15/2019 with a chief complaint of weakness. Now with E coli bacteremia. Also concern for aspiration on CT.    Name of physician (or Provider) Contacted: Faye Ramsay ([facility])  Current antibiotics: Vancomycin/Cefepime/Flagyl  Changes to prescribed antibiotics recommended:  No changes for now with concern for aspiration  Consider DC vancomycin soon  Results for orders placed or performed during the hospital encounter of 07/21/2019  Blood Culture ID Panel (Reflexed) (Collected: 08/06/2019  1:42 PM)  Result Value Ref Range   Enterococcus species NOT DETECTED NOT DETECTED   Listeria monocytogenes NOT DETECTED NOT DETECTED   Staphylococcus species NOT DETECTED NOT DETECTED   Staphylococcus aureus (BCID) NOT DETECTED NOT DETECTED   Streptococcus species NOT DETECTED NOT DETECTED   Streptococcus agalactiae NOT DETECTED NOT DETECTED   Streptococcus pneumoniae NOT DETECTED NOT DETECTED   Streptococcus pyogenes NOT DETECTED NOT DETECTED   Acinetobacter baumannii NOT DETECTED NOT DETECTED   Enterobacteriaceae species DETECTED (A) NOT DETECTED   Enterobacter cloacae complex NOT DETECTED NOT DETECTED   Escherichia coli DETECTED (A) NOT DETECTED   Klebsiella oxytoca NOT DETECTED NOT DETECTED   Klebsiella pneumoniae NOT DETECTED NOT DETECTED   Proteus species NOT DETECTED NOT DETECTED   Serratia marcescens NOT DETECTED NOT DETECTED   Carbapenem resistance NOT DETECTED NOT DETECTED   Haemophilus influenzae NOT DETECTED NOT DETECTED   Neisseria meningitidis NOT DETECTED NOT DETECTED   Pseudomonas aeruginosa NOT DETECTED NOT DETECTED   Candida albicans NOT DETECTED NOT DETECTED   Candida glabrata NOT DETECTED NOT DETECTED   Candida krusei NOT DETECTED NOT DETECTED   Candida parapsilosis NOT DETECTED NOT DETECTED   Candida tropicalis NOT  DETECTED NOT DETECTED    Narda Bonds 07/14/2019  6:01 AM

## 2019-07-14 NOTE — ED Notes (Signed)
Speech therapy at bedside.

## 2019-07-15 ENCOUNTER — Ambulatory Visit: Payer: Medicare Other | Admitting: Cardiology

## 2019-07-15 ENCOUNTER — Inpatient Hospital Stay (HOSPITAL_COMMUNITY): Payer: Medicare Other

## 2019-07-15 DIAGNOSIS — E43 Unspecified severe protein-calorie malnutrition: Secondary | ICD-10-CM | POA: Diagnosis present

## 2019-07-15 LAB — BASIC METABOLIC PANEL
Anion gap: 11 (ref 5–15)
BUN: 29 mg/dL — ABNORMAL HIGH (ref 8–23)
CO2: 21 mmol/L — ABNORMAL LOW (ref 22–32)
Calcium: 7.8 mg/dL — ABNORMAL LOW (ref 8.9–10.3)
Chloride: 108 mmol/L (ref 98–111)
Creatinine, Ser: 1.24 mg/dL (ref 0.61–1.24)
GFR calc Af Amer: 58 mL/min — ABNORMAL LOW (ref >60)
GFR calc non Af Amer: 50 mL/min — ABNORMAL LOW (ref >60)
Glucose, Bld: 81 mg/dL (ref 70–99)
Potassium: 3.9 mmol/L (ref 3.5–5.1)
Sodium: 140 mmol/L (ref 135–145)

## 2019-07-15 LAB — CBC WITH DIFFERENTIAL/PLATELET
Abs Immature Granulocytes: 0.08 10*3/uL — ABNORMAL HIGH (ref 0.00–0.07)
Basophils Absolute: 0 10*3/uL (ref 0.0–0.1)
Basophils Relative: 0 %
Eosinophils Absolute: 0 10*3/uL (ref 0.0–0.5)
Eosinophils Relative: 0 %
HCT: 31.9 % — ABNORMAL LOW (ref 39.0–52.0)
Hemoglobin: 10.4 g/dL — ABNORMAL LOW (ref 13.0–17.0)
Immature Granulocytes: 1 %
Lymphocytes Relative: 4 %
Lymphs Abs: 0.5 10*3/uL — ABNORMAL LOW (ref 0.7–4.0)
MCH: 27.2 pg (ref 26.0–34.0)
MCHC: 32.6 g/dL (ref 30.0–36.0)
MCV: 83.3 fL (ref 80.0–100.0)
Monocytes Absolute: 0.6 10*3/uL (ref 0.1–1.0)
Monocytes Relative: 5 %
Neutro Abs: 10.7 10*3/uL — ABNORMAL HIGH (ref 1.7–7.7)
Neutrophils Relative %: 90 %
Platelets: 209 10*3/uL (ref 150–400)
RBC: 3.83 MIL/uL — ABNORMAL LOW (ref 4.22–5.81)
RDW: 15.1 % (ref 11.5–15.5)
WBC: 11.8 10*3/uL — ABNORMAL HIGH (ref 4.0–10.5)
nRBC: 0 % (ref 0.0–0.2)

## 2019-07-15 LAB — PROCALCITONIN: Procalcitonin: 12.26 ng/mL

## 2019-07-15 LAB — LACTIC ACID, PLASMA: Lactic Acid, Venous: 1 mmol/L (ref 0.5–1.9)

## 2019-07-15 LAB — URINE CULTURE

## 2019-07-15 LAB — MAGNESIUM: Magnesium: 2.2 mg/dL (ref 1.7–2.4)

## 2019-07-15 MED ORDER — ACETAMINOPHEN 325 MG PO TABS
650.0000 mg | ORAL_TABLET | Freq: Four times a day (QID) | ORAL | Status: DC | PRN
  Administered 2019-07-16 – 2019-07-17 (×2): 650 mg via ORAL
  Filled 2019-07-15 (×3): qty 2

## 2019-07-15 MED ORDER — SODIUM CHLORIDE 0.9 % IV SOLN
INTRAVENOUS | Status: DC | PRN
  Administered 2019-07-15 – 2019-07-17 (×2): 1000 mL via INTRAVENOUS

## 2019-07-15 MED ORDER — RESOURCE THICKENUP CLEAR PO POWD
ORAL | Status: DC | PRN
  Filled 2019-07-15: qty 125

## 2019-07-15 MED ORDER — CYANOCOBALAMIN 1000 MCG/ML IJ SOLN: 1000.0000 ug | INTRAMUSCULAR | Status: DC

## 2019-07-15 MED ORDER — SODIUM CHLORIDE 0.9 % IV SOLN
2.0000 g | INTRAVENOUS | Status: DC
  Administered 2019-07-15 – 2019-07-17 (×3): 2 g via INTRAVENOUS
  Filled 2019-07-15: qty 2
  Filled 2019-07-15 (×4): qty 20

## 2019-07-15 NOTE — Progress Notes (Signed)
Initial Nutrition Assessment  DOCUMENTATION CODES:   Severe malnutrition in context of chronic illness  INTERVENTION:    Magic cup TID with meals, each supplement provides 290 kcal and 9 grams of protein  Double protein with each meal  MVI daily   NUTRITION DIAGNOSIS:   Severe Malnutrition related to chronic illness(dementia/Parkinson's) as evidenced by moderate fat depletion, energy intake < or equal to 75% for > or equal to 1 month, severe muscle depletion.  GOAL:   Patient will meet greater than or equal to 90% of their needs  MONITOR:   PO intake, Weight trends, Supplement acceptance, Skin, I & O's, Labs  REASON FOR ASSESSMENT:   Consult Assessment of nutrition requirement/status  ASSESSMENT:   Patient with PMH significant for SSS, chronic hyponatremia, Parkinson's disease with autonomic dysfunction, dementia, HLD, OSA, prostate cancer, SDH, and CVA. Presents this admission with sepsis likely secondary to UTI and aspiration PNA.    Spoke with wife at bedside (pt confused). Denies pt has loss of appetite PTA. Typically eats three meals daily that consist of B- eggs, grits, chopped bacon L- salisbury steak or chicken casserole D- chicken pie. He would drink 1-2 Ensures daily. Wife states over the last year pt had trouble swallowing softer/pureed foods (would have coughing spells immediatly after). SLP saw pt this afternoon. Recommends DYS 1/honey thick liquids. RD observed lunch tray 90% completed at bedside. Discussed the importance of protein intake for preservation of lean body mass. Will continue with Magic Cup as pt enjoyed the one on his meal tray and add double protein portions. PMT to meet with family to establish [place].   Of note: wife states pt has had off/on diarrhea for months. Diarrhea continues this admission.   Wife reports pt's UBW stays around 160 lb and endorses recent unintentional wt loss of 13 lb over the last year. Records indicate pt's wt has fluctuated  from 153 lb to 157 lb over the last year. Pt walks with walker at home.   I/O: +1,800 ml since admit  UOP: 950 ml x 24 hrs   Medications: Vitamin D, ferrous sulfate  Labs: BUN 29- trending down CBG 81-131  NUTRITION - FOCUSED PHYSICAL EXAM:    Most Recent Value  Orbital Region  Moderate depletion  Upper Arm Region  Severe depletion  Thoracic and Lumbar Region  Unable to assess  Buccal Region  Moderate depletion  Temple Region  Severe depletion  Clavicle Bone Region  Severe depletion  Clavicle and Acromion Bone Region  Severe depletion  Scapular Bone Region  Unable to assess  Dorsal Hand  Severe depletion  Patellar Region  Severe depletion  Anterior Thigh Region  Severe depletion  Posterior Calf Region  Severe depletion  Edema (RD Assessment)  None  Hair  Reviewed  Eyes  Reviewed  Mouth  Reviewed  Skin  Reviewed  Nails  Reviewed     Diet Order:   Diet Order            DIET - DYS 1 Room service appropriate? No; Fluid consistency: Honey Thick  Diet effective now              EDUCATION NEEDS:   Education needs have been addressed  Skin:  Skin Assessment: Skin Integrity Issues: Skin Integrity Issues:: Other (Comment) Other: MASD- L/R buttocks  Last BM:  1/6  Height:   Ht Readings from Last 1 Encounters:  07/14/19 6' (1.829 m)    Weight:   Wt Readings from Last 1 Encounters:  07/15/19 69.6 kg    Ideal Body Weight:  80.9 kg  BMI:  Body mass index is 20.81 kg/m.  Estimated Nutritional Needs:   Kcal:  2000-2200 kcal  Protein:  100-120 grams  Fluid:  >/= 2 L/day   Mariana Single RD, LDN Clinical Nutrition Pager # - [phone]

## 2019-07-15 NOTE — Progress Notes (Signed)
  Speech Language Pathology Treatment: Dysphagia  Patient Details Name: Joe Glover MRN: [id number] DOB: 1925-02-08 Today's Date: 07/15/2019 Time: [id number] SLP Time Calculation (min) (ACUTE ONLY): 26 min  Assessment / Plan / Recommendation Clinical Impression  Pt was seen at bedside prior to [facility], as his wife called out for ice chips for him. Pt was listed as NPO, so I went in to provide education. Pt was awake, pleasant and cooperative. Speech is severely dysarthric. Tongue surface was noted to be dry, with thick secretions seen on velum, which was flaccid. Minimal tongue movement noted when asked to lateralize and elevate the tongue. Suction was set up, and pt completed oral care with mod assist. Secretions on palate were not removed, and pt grimaced during oral care, indicating increased sensitivity. Oral cavity was otherwise pink and healthy. Following oral care, pt was given individual ice chips x2. Poor oral control of the ice chips noted, but no swallow reflex was elicited. Pt and wife were educated regarding importance of oral care, using green oral swabs once and throwing them away, and specifics regarding the upcoming MBS. Session ended when transport arrived. Wife accompanied SLP and pt to radiology.     HPI HPI: Joe Glover is a 86 y.o. male with medical history significant of left intraparenchymal hemorrhage 06/18/19, Parkinson's with autonomic dysfunction, paroxysmal atrial fibrillation, sick sinus syndrome, orthostatic hypotension, pseudogout, subdural hematoma, hypertension difficult to manage secondary to intermittent orthostatic hypotension, history of frequent falls, sleep apnea. Admitted with generalized weakness, on puree diet with frequent coughing per wife. CXR Bilateral lower lobe peribronchial thickening with small airways impaction, patchy ground-glass density, and centrilobular nodularity, suspicious for aspiration. BSE 06/18/19 Per eval pt receives Botox injections, +  cough with thin, tolerated nectar, frequent belching. No indications pna, knowledgeable wife re: strategies, regular/thin recommended.       SLP Plan  Continue with current plan of care       Recommendations  Diet recommendations: NPO Medication Administration: Via alternative means                Oral Care Recommendations: Oral care QID Follow up Recommendations: 24 hour supervision/assistance SLP Visit Diagnosis: Dysphagia, oropharyngeal phase (R13.12) Plan: Continue with current plan of care       [place], [facility], [place] Pathologist Office: [phone] Pager: [phone]  Shonna Chock 07/15/2019, 11:54 AM

## 2019-07-15 NOTE — Progress Notes (Signed)
Pt has his home CPAP tonight. Pts wife will place pt on home machine without difficulty per pt wife. RT will continue to monitor.

## 2019-07-15 NOTE — Progress Notes (Signed)
RT placed pt on CPAP dream station for the night in auto titrate 12 max 6 min w/no oxygen bled into the system. Pt respiratory status stable at this time. RT will continue to monitor.

## 2019-07-15 NOTE — Progress Notes (Signed)
Patient's wife at bedside.

## 2019-07-15 NOTE — Progress Notes (Signed)
Patient has had 2 looses stools, requested PO tylenol for pain and increased temp.

## 2019-07-15 NOTE — Consult Note (Signed)
Goals of care meeting at bedside with patient and his wife.    Mr. Joe Glover had just completed his meal tray with new restrictions offered by SLP.  Mrs. Joe Glover reported that he did "very well" with this change to puree, minimal coughing with small bites and directed swallowing.  Introduced myself as PMT consult, reviewed my role in assisting with goals of care.  Mr. Joe Glover participated in listening to the conversation.  His speech is garbled at baseline due to disease progression although he did verbally communicate a few things with repeat back understanding.  He denies pain.  He was sitting upright in bed and did not appear to have trouble managing secretions during the 20 minute consult.  Mrs. Joe Glover clearly stated that their goal was to return to [facility] with new diet restrictions and continue his course of rehab with the ultimate goal of returning home.  She feels that the change in antibiotics along with the instructions for eating and drinking will make a difference in his ability continue with rehab.   I think it is clear that Mr. Joe Glover will likely have difficulty completing a rigorous rehabilitation due to recurring infection, debility from hospitalizations and comorbid, cardiac/respiratory complications.  The goals of care discussion needs to continue.  Building trust with Mrs. Joe Glover will be most important issue in presenting her with the likely poor outcome of continued, recurring hospitalizations and inability to complete rehab for a meaningful discharge back to home.    Plan is to meet with them again tomorrow and explore the current situation in greater detail.  Kizzie Fantasia, MSN, RN-BC, [facility], [facility] Palliative Clinical Specialist

## 2019-07-15 NOTE — Progress Notes (Signed)
Modified Barium Swallow Progress Note  Patient Details  Name: Joe Glover MRN: [id number] Date of Birth: 03/08/1925  Today's Date: 07/15/2019  Modified Barium Swallow completed.  Full report located under Chart Review in the Imaging Section.  Brief recommendations include the following:  Clinical Impression      Pt was seen for MBS to objectively assess swallow function and safety, and to identify least restrictive diet. Wife was present during this assessment. Pt accepted trials of nectar thick and honey thick liquids, and puree textures. Pt presents with significant oral and pharyngeal dysphagia, characterized as follows:  ORALLY, pt's lingual and velar movement is markedly restricted, resulting in poor bolus control, formation, and posterior propulsion, as well as premature spillage and residue on the lingual and velar surfaces. Despite flaccidity of the soft palate, Velopharyngeal closure appears adequate, as pt does not exhibit nasal regurgitation.  PHARYNGEAL swallow is characterized by trigger of the reflex at the level of the vallecular sinus across consistencies. Weakness is apparent, and worsens as the study progresses. Muscular strength is inadequate for stripping and airway closure, resulting in post-swallow residue in the vallecula and pyriforms, which increases with additional boluses and secretions. Penetration of nectar and honey thick liquids was noted during the swallow, with immediate sensation and throat clear, however, as the study progressed, pt was less able to clear penetrate. No penetration was seen on puree consistency, however, the risk is still elevated. Thin liquids, solid textures and barium tablet were not provided during this assessment due to high risk of aspiration.   At this time, puree diet and honey thick liquids via teaspoon is recommended, with meds crushed in puree. Given advanced age and Parkinson's, fatigue during meals with consequent increase in  aspiration risk is highly likely. In addition, pt will have difficulty meeting his nutrition and hydration needs on a diet this restrictive. Significant improvement in function and safety is unlikely. Palliative Care consult is recommended to facilitate establishment of appropriate goals of care for this sweet gentleman and his wife, who is attentive, caring and concerned. Wife was provided with extensive education using review of MBS videos, and was given written safe swallow strategies. SLP will continue to follow to provide education.     Swallow Evaluation Recommendations  Recommended Consults: Other (Comment)(Palliative Care)   SLP Diet Recommendations: Dysphagia 1 (Puree) solids;Honey thick liquids   Liquid Administration via: Spoon   Medication Administration: Crushed with puree   Supervision: Full assist for feeding;Full supervision/cueing for compensatory strategies   Compensations: Minimize environmental distractions;Slow rate;Small sips/bites;Multiple dry swallows after each bite/sip;Follow solids with liquid;Clear throat intermittently   Postural Changes: Remain semi-upright after after feeds/meals (Comment);Seated upright at 90 degrees   Oral Care Recommendations: Oral care QID;Staff/trained caregiver to provide oral care   Other Recommendations: Order thickener from pharmacy;Have oral suction available  Enriqueta Shutter, [place], [place] Speech Language Pathologist Office: [phone] Pager: [phone]  Shonna Chock 07/15/2019,12:44 PM

## 2019-07-15 NOTE — Evaluation (Signed)
Physical Therapy Evaluation Patient Details Name: Joe Glover MRN: [id number] DOB: December 17, 1924 Today's Date: 07/15/2019   History of Present Illness  Pt is 86 y.o. male with medical history significant of paroxysmal atrial fibrillation, sick sinus syndrome, orthostatic hypotension and recurrent syncope, chronic hyponatremia, Parkinson's disease with autonomic dysfunction, dementia, hyperlipidemia, anemia, OSA, prostate cancer, subdural hematoma, subarachnoid hemorrhage, CVA with residual dysarthria, and for full PMH see H and P.  Presented with weakness and admitted with sepsis with organ dysfunction and orthostatic hypotension.  Clinical Impression  Pt admitted with above diagnosis. Despite fatigue, pt was able to transfer with min-mod A and took a couple steps at the edge of the bed using RW.  Pt and wife are very pleasant and motivated to improve pt's mobility.   Pt currently with functional limitations due to the deficits listed below (see PT Problem List). Pt will benefit from skilled PT to increase their independence and safety with mobility to allow discharge to the venue listed below.      Follow Up Recommendations SNF;Supervision/Assistance - 24 hour    Equipment Recommendations  Other (comment)(tbd next venue)    Recommendations for Other Services       Precautions / Restrictions Precautions Precautions: Fall Precaution Comments: h/o syncope      Mobility  Bed Mobility Overal bed mobility: Needs Assistance Bed Mobility: Supine to Sit;Sit to Supine     Supine to sit: Min assist Sit to supine: Min assist   General bed mobility comments: HOB elevated, increased time  Transfers Overall transfer level: Needs assistance Equipment used: Rolling walker (2 wheeled) Transfers: Sit to/from Stand Sit to Stand: Mod assist;From elevated surface         General transfer comment: assist to boost up  Ambulation/Gait Ambulation/Gait assistance: Mod assist Gait Distance  (Feet): 3 Feet Assistive device: Rolling walker (2 wheeled) Gait Pattern/deviations: Decreased stride length;Shuffle;Narrow base of support Gait velocity: decreased   General Gait Details: steps to [facility]; assist for balance and RW; pt fatigued and reports needing to go back to bed (spent 2 nights in ED and just returned from swallow study)  Stairs            Wheelchair Mobility    Modified Rankin (Stroke Patients Only)       Balance Overall balance assessment: Needs assistance Sitting-balance support: Feet supported;Bilateral upper extremity supported Sitting balance-Leahy Scale: Fair     Standing balance support: Bilateral upper extremity supported;During functional activity Standing balance-Leahy Scale: Fair Standing balance comment: used RW                             Pertinent Vitals/Pain Pain Assessment: No/denies pain Faces Pain Scale: Hurts a little bit Pain Location: grimacing during oral care Pain Descriptors / Indicators: Grimacing Pain Intervention(s): Limited activity within patient's tolerance;Monitored during session    Home Living Family/patient expects to be discharged to:: Skilled nursing facility Living Arrangements: Spouse/significant other Available Help at Discharge: Family;Available 24 hours/day Type of Home: House Home Access: Level entry;Ramped entrance     Home Layout: Multi-level Home Equipment: Walker - 2 wheels;Cane - single point;Bedside commode;Grab bars - tub/shower;Shower seat;Hand held shower head;Grab bars - toilet;Transport chair Additional Comments: Has a RW and BSC on every level; has had hospital bed in living room (rented) if needed    Prior Function Level of Independence: Needs assistance   Gait / Transfers Assistance Needed: Prior to recent hospitalizations he could walk with assist in  house; the past few weeks has been in hospital or rehab and walking some with therapy  ADL's / Homemaking Assistance Needed:  wife assists with all ADLs.        Hand Dominance        Extremity/Trunk Assessment   Upper Extremity Assessment Upper Extremity Assessment: RUE deficits/detail;LUE deficits/detail RUE Deficits / Details: ROM WFL; Strength 4/5 LUE Deficits / Details: ROM WFL; Strength 4/5    Lower Extremity Assessment Lower Extremity Assessment: LLE deficits/detail;RLE deficits/detail RLE Deficits / Details: ROM WFL; Strength 4/5 LLE Deficits / Details: ROM WFL; Strength 4/5    Cervical / Trunk Assessment Cervical / Trunk Assessment: Kyphotic  Communication      Cognition Arousal/Alertness: Awake/alert Behavior During Therapy: WFL for tasks assessed/performed Overall Cognitive Status: Difficult to assess                                 General Comments: difficult to assess due to mumbled and garbled speech, wife reports this is baseline and he also has hearing issues, did follow commands very well today      General Comments General comments (skin integrity, edema, etc.): VSS    Exercises     Assessment/Plan    PT Assessment Patient needs continued PT services  PT Problem List Decreased strength;Decreased activity tolerance;Decreased balance;Decreased mobility;Decreased knowledge of use of DME;Cardiopulmonary status limiting activity;Decreased safety awareness       PT Treatment Interventions Therapeutic activities;Gait training;Therapeutic exercise;Patient/family education;Balance training;Stair training;Functional mobility training;DME instruction;Neuromuscular re-education    PT Goals (Current goals can be found in the Care Plan section)  Acute Rehab PT Goals Patient Stated Goal: return to [facility]/or SNF then home PT Goal Formulation: With patient/family Time For Goal Achievement: 07/29/19 Potential to Achieve Goals: Good    Frequency Min 2X/week   Barriers to discharge        Co-evaluation               AM-PAC PT "6 Clicks" Mobility  Outcome  Measure Help needed turning from your back to your side while in a flat bed without using bedrails?: A Little Help needed moving from lying on your back to sitting on the side of a flat bed without using bedrails?: A Little Help needed moving to and from a bed to a chair (including a wheelchair)?: A Little Help needed standing up from a chair using your arms (e.g., wheelchair or bedside chair)?: A Lot Help needed to walk in hospital room?: A Lot Help needed climbing 3-5 steps with a railing? : Total 6 Click Score: 14    End of Session Equipment Utilized During Treatment: Gait belt Activity Tolerance: Patient limited by fatigue Patient left: with call bell/phone within reach;with family/visitor present;in bed;with bed alarm set Nurse Communication: Mobility status(white board communication) PT Visit Diagnosis: Muscle weakness (generalized) (M62.81);Other abnormalities of gait and mobility (R26.89)    Time: 1410-1440 PT Time Calculation (min) (ACUTE ONLY): 30 min   Charges:   PT Evaluation $PT Eval Moderate Complexity: 1 Mod          Maggie Font, PT Acute Rehab Services Pager [phone] [facility] [phone] [facility] [phone]   Karlton Lemon 07/15/2019, 2:53 PM

## 2019-07-15 NOTE — Progress Notes (Addendum)
PROGRESS NOTE    Joe Glover  [id number] DOB: Nov 20, 1924 DOA: 07/14/2019 PCP: Joe Dress, MD   Brief Narrative:  HPI per Dr. [name] is a 86 y.o. male with medical history significant of paroxysmal atrial fibrillation, sick sinus syndrome, orthostatic hypotension and recurrent syncope, chronic hyponatremia, Parkinson's disease with autonomic dysfunction, dementia,hyperlipidemia, anemia, OSA, prostate cancer, subdural hematoma, subarachnoid hemorrhage, CVAwith residual dysarthria, and conditions listed below presenting to the ED for evaluation for increasing weakness.  Recently hospitalized for syncopal episode, found to have orthostatic hypotension secondary to advanced Parkinson disease, for which patient was started on midodrine also UTI-patient was discharged with Cipro.  Patient wife was at bedside, reported that patient has had worsening of weakness, and has been on pured diet but according to the wife who has been feeding the patient patient has had frequent cough even choking after eating.  Patient has had multiple episodes of loose diarrhea every day but denied any abdominal pain.  Patient wife reported she did not put the diarrhea is C. Difficile (she is confident of the smell of C. Difficile).  Wife denies and had any fever or chills at nursing home. ED Course: he was found to be hypotensive and hypoxia, code sepsis was called and patient was started on vancomycin and cefepime, 3 L IV fluid bolus was given and patient became hypertensive, tachycardia and wheezing.   Assessment & Plan:   Principal Problem:   Severe sepsis with acute organ dysfunction (HCC) Active Problems:   E coli bacteremia   Aspiration pneumonia (HCC)   Acute metabolic encephalopathy   OSA (obstructive sleep apnea)   Parkinson disease (HCC)   Essential hypertension   Orthostatic hypotension   PAF (paroxysmal atrial fibrillation) (HCC)   Sick sinus syndrome (HCC)   Sepsis ([place])   Acute lower UTI  1 severe sepsis with acute organ dysfunction likely secondary to E. Coli bacteremia/UTI/aspiration pneumonia Patient had presented to the ED due to increasing weakness, noted to be lethargic on admission and noted to be hypotensive and noted to have a fever with temperature as high as 102, lactic acid elevated at 2.6 on admission with a leukocytosis with a white count of 21, CT chest worrisome for pneumonia, urinalysis with large leukocytes, greater than 50 WBCs, many bacteria.  Blood cultures preliminary positive for E. coli.  Urine cultures with multiple species.  Patient with recent urine cultures that were positive for E. coli during last hospitalization.  Patient noted to be hypotensive received fluid bolus with some wheezing and worsening shortness of breath.  Blood pressure improved.  WBC trending down.  Lactic acid level trending down.  Discontinue IV vancomycin and IV cefepime.  Place on IV Rocephin.  Continue IV Flagyl.  Speech therapy evaluated patient and patient high risk for aspiration.  Will likely need treatment for 7 to 10 days.  Follow.  2.  Orthostatic hypotension Patient noted to have a history of orthostatic hypotension.  Patient on presentation with systolic blood pressures in the 70s.  Patient given a fluid bolus however supposedly had some wheezing and shortness of breath which has since improved.  Blood pressure improved.  Continue midodrine.    3.  Acute metabolic encephalopathy Likely secondary to problem #1.  Patient more alert today and following commands.  Patient improving somewhat clinically.  Continue empiric IV antibiotics as in problem #1.  4.  Dysphagia Patient with dysphagia noted to be on pured diet per wife at home however per RN wife states patient  has been having some episodes of choking even with a pured diet.  Patient has been assessed by speech therapy and patient noted to be a high aspiration risk.  Patient underwent modified barium  swallow.  Speech therapy recommending dysphagia 1 diet with honey thick liquids.  SLP following.  5.  Parkinson's disease Outpatient follow-up.  6.  Paroxysmal atrial fibrillation Patient in sinus rhythm.  Patient noted to be tachycardic and due to dysphagia and mental status patient was started on amiodarone drip on admission.  Heart rate seems controlled.  Amiodarone drip has been discontinued as of 07/14/2019.  Heart rate controlled.  Patient a poor anticoagulation candidate due to frequent falls and history of GI bleed and intracranial bleed.  Outpatient follow-up with cardiology.  7.  Generalized weakness PT/OT.  8.  Severe protein calorie malnutrition Patient with high risk for aspiration currently on a dysphagia 1 diet with honey thick liquids.  Consult with dietitian.  May need to be place on a Magic cup.  9.  Prognosis Patient with multiple comorbidities, advanced age of 50, recent hospitalization for UTI, dysphagia now presenting with severe sepsis secondary to E. coli bacteremia, UTI, aspiration pneumonia.  Palliative care consultation for goals of care pending.   DVT prophylaxis: SCDs Code Status: DNR Family Communication: Updated wife at bedside. Disposition Plan: To be determined.   Consultants:   Palliative care pending  Procedures:   CT chest 08/03/2019  Chest x-ray 07/15/2019  Modified barium swallow 07/15/2019  Antimicrobials:  IV cefepime 07/26/2019>>>> 07/15/2019  IV vancomycin 08/02/2019>>>>> 07/15/2019  IV Flagyl 07/22/2019  IV Rocephin 07/15/2019   Subjective: Patient more alert today.  Denies any chest pain.  States he is feeling better than on admission.  Denies any chest pain.  No abdominal pain.  Wife at bedside.   Objective: Vitals:   07/15/19 0024 07/15/19 0104 07/15/19 0459 07/15/19 0902  BP:  (!) 150/93 (!) 147/107 (!) 142/95  Pulse: 74 88 86 93  Resp: 20 19 20 18   Temp:  98.2 F (36.8 C) 98.2 F (36.8 C) 98.5 F (36.9 C)  TempSrc:  Oral Oral  Oral  SpO2: 97% 96% 98% 95%  Weight:   69.6 kg   Height:        Intake/Output Summary (Last 24 hours) at 07/15/2019 1140 Last data filed at 07/15/2019 0645 Gross per 24 hour  Intake 250 ml  Output 950 ml  Net -700 ml   Filed Weights   08/01/2019 1704 07/14/19 1721 07/15/19 0459  Weight: 66.2 kg 71.2 kg 69.6 kg    Examination:  General exam: Frail.  Chronically ill-appearing.  Extremely dry mucous membranes. Respiratory system: Coarse breath sounds in the right base.  Improved air movement.  No wheezing.  Normal respiratory effort.   Cardiovascular system: Regular rate rhythm no murmurs rubs or gallops.  No JVD.  No lower extremity edema.  Gastrointestinal system: Abdomen is soft, nontender, nondistended, positive bowel sounds.  No rebound.  No guarding.   Central nervous system: Alert and oriented. No focal neurological deficits. Extremities: Symmetric 5 x 5 power. Skin: No rashes, lesions or ulcers Psychiatry: Judgement and insight appear poor to fair. Mood & affect appropriate.     Data Reviewed: I have personally reviewed following labs and imaging studies  CBC: Recent Labs  Lab 08/02/2019 1111 07/14/19 0600 07/15/19 0504  WBC 21.0* 16.7* 11.8*  NEUTROABS  --   --  10.7*  HGB 10.9* 9.7* 10.4*  HCT 33.0* 29.9* 31.9*  MCV 84.6  85.4 83.3  PLT 289 197 [id number]   Basic Metabolic Panel: Recent Labs  Lab 07/17/2019 1111 07/27/2019 1400 07/14/19 0600 07/15/19 0504  NA 135  --  135 140  K 3.8  --  3.5 3.9  CL 101  --  103 108  CO2 21*  --  21* 21*  GLUCOSE 131*  --  111* 81  BUN 31*  --  39* 29*  CREATININE 1.46*  --  1.47* 1.24  CALCIUM 8.3*  --  7.7* 7.8*  MG  --  1.6* 1.8 2.2   GFR: Estimated Creatinine Clearance: 37.4 mL/min (by C-G formula based on SCr of 1.24 mg/dL). Liver Function Tests: No results for input(s): AST, ALT, ALKPHOS, BILITOT, PROT, ALBUMIN in the last 168 hours. No results for input(s): LIPASE, AMYLASE in the last 168 hours. No results for input(s):  AMMONIA in the last 168 hours. Coagulation Profile: Recent Labs  Lab 07/22/2019 1400 07/14/19 0600  INR 1.2 1.3*   Cardiac Enzymes: No results for input(s): CKTOTAL, CKMB, CKMBINDEX, TROPONINI in the last 168 hours. BNP (last 3 results) No results for input(s): PROBNP in the last 8760 hours. HbA1C: No results for input(s): HGBA1C in the last 72 hours. CBG: No results for input(s): GLUCAP in the last 168 hours. Lipid Profile: No results for input(s): CHOL, HDL, LDLCALC, TRIG, CHOLHDL, LDLDIRECT in the last 72 hours. Thyroid Function Tests: No results for input(s): TSH, T4TOTAL, FREET4, T3FREE, THYROIDAB in the last 72 hours. Anemia Panel: No results for input(s): VITAMINB12, FOLATE, FERRITIN, TIBC, IRON, RETICCTPCT in the last 72 hours. Sepsis Labs: Recent Labs  Lab 08/04/2019 1344 08/01/2019 1559 07/14/19 0600 07/15/19 0504  PROCALCITON  --   --  28.60 12.26  LATICACIDVEN 2.6* 2.4* 1.2 1.0    Recent Results (from the past 240 hour(s))  Blood Culture (routine x 2)     Status: Abnormal (Preliminary result)   Collection Time: 07/17/2019  1:42 PM   Specimen: BLOOD  Result Value Ref Range Status   Specimen Description BLOOD LEFT ANTECUBITAL  Final   Special Requests   Final    BOTTLES DRAWN AEROBIC AND ANAEROBIC Blood Culture adequate volume   Culture  Setup Time   Final    GRAM NEGATIVE RODS IN BOTH AEROBIC AND ANAEROBIC BOTTLES Organism ID to follow CRITICAL RESULT CALLED TO, READ BACK BY AND VERIFIED WITH: J.LEDFORD,PHARMD AT 0557 ON 07/14/19 BY G.[email or web address] Performed at  [facility], [place] [address], [place], [place] [address]    Culture ESCHERICHIA COLI (A)  Final   Report Status PENDING  Incomplete  Blood Culture ID Panel (Reflexed)     Status: Abnormal   Collection Time: 07/10/2019  1:42 PM  Result Value Ref Range Status   Enterococcus species NOT DETECTED NOT DETECTED Final   Listeria monocytogenes NOT DETECTED NOT DETECTED Final   Staphylococcus species NOT DETECTED  NOT DETECTED Final   Staphylococcus aureus (BCID) NOT DETECTED NOT DETECTED Final   Streptococcus species NOT DETECTED NOT DETECTED Final   Streptococcus agalactiae NOT DETECTED NOT DETECTED Final   Streptococcus pneumoniae NOT DETECTED NOT DETECTED Final   Streptococcus pyogenes NOT DETECTED NOT DETECTED Final   Acinetobacter baumannii NOT DETECTED NOT DETECTED Final   Enterobacteriaceae species DETECTED (A) NOT DETECTED Final    Comment: Enterobacteriaceae represent a large family of gram-negative bacteria, not a single organism. CRITICAL RESULT CALLED TO, READ BACK BY AND VERIFIED WITH: J.LEDFORD,PHARMD AT 0557 ON 07/14/19 BY G.MCADOO    Enterobacter cloacae complex NOT  DETECTED NOT DETECTED Final   Escherichia coli DETECTED (A) NOT DETECTED Final    Comment: CRITICAL RESULT CALLED TO, READ BACK BY AND VERIFIED WITH: J.LEDFORD,PHARMD AT 0557 ON 07/14/19 BY G.MCADOO    Klebsiella oxytoca NOT DETECTED NOT DETECTED Final   Klebsiella pneumoniae NOT DETECTED NOT DETECTED Final   Proteus species NOT DETECTED NOT DETECTED Final   Serratia marcescens NOT DETECTED NOT DETECTED Final   Carbapenem resistance NOT DETECTED NOT DETECTED Final   Haemophilus influenzae NOT DETECTED NOT DETECTED Final   Neisseria meningitidis NOT DETECTED NOT DETECTED Final   Pseudomonas aeruginosa NOT DETECTED NOT DETECTED Final   Candida albicans NOT DETECTED NOT DETECTED Final   Candida glabrata NOT DETECTED NOT DETECTED Final   Candida krusei NOT DETECTED NOT DETECTED Final   Candida parapsilosis NOT DETECTED NOT DETECTED Final   Candida tropicalis NOT DETECTED NOT DETECTED Final    Comment: Performed at [facility], [place] [address], [place], [place] [address]  Blood Culture (routine x 2)     Status: None (Preliminary result)   Collection Time: 07/31/2019  2:12 PM   Specimen: BLOOD LEFT FOREARM  Result Value Ref Range Status   Specimen Description BLOOD LEFT FOREARM  Final   Special Requests   Final     BOTTLES DRAWN AEROBIC AND ANAEROBIC Blood Culture results may not be optimal due to an inadequate volume of blood received in culture bottles   Culture  Setup Time   Final    GRAM NEGATIVE RODS ANAEROBIC BOTTLE ONLY CRITICAL VALUE NOTED.  VALUE IS CONSISTENT WITH PREVIOUSLY REPORTED AND CALLED VALUE. Performed at [facility], [place] [address], [place], [place] [address]    Culture GRAM NEGATIVE RODS  Final   Report Status PENDING  Incomplete  Urine culture     Status: Abnormal   Collection Time: 07/17/2019  5:23 PM   Specimen: In/Out Cath Urine  Result Value Ref Range Status   Specimen Description IN/OUT CATH URINE  Final   Special Requests   Final    NONE Performed at [facility], [place] [address] [place],  [address]    Culture MULTIPLE SPECIES PRESENT, SUGGEST RECOLLECTION (A)  Final   Report Status 07/15/2019 FINAL  Final  Respiratory Panel by RT PCR (Flu A&B, Covid) - Nasopharyngeal Swab     Status: None   Collection Time: 07/16/2019  6:29 PM   Specimen: Nasopharyngeal Swab  Result Value Ref Range Status   SARS Coronavirus 2 by RT PCR NEGATIVE NEGATIVE Final    Comment: (NOTE) SARS-CoV-2 target nucleic acids are NOT DETECTED. The SARS-CoV-2 RNA is generally detectable in upper respiratoy specimens during the acute phase of infection. The lowest concentration of SARS-CoV-2 viral copies this assay can detect is 131 copies/mL. A negative result does not preclude SARS-Cov-2 infection and should not be used as the sole basis for treatment or other patient management decisions. A negative result may occur with  improper specimen collection/handling, submission of specimen other than nasopharyngeal swab, presence of viral mutation(s) within the areas targeted by this assay, and inadequate number of viral copies (<131 copies/mL). A negative result must be combined with clinical observations, patient history, and epidemiological information. The expected result is  Negative. Fact Sheet for Patients:  PinkCheek.be Fact Sheet for Healthcare Providers:  GravelBags.it This test is not yet ap proved or cleared by the [place] FDA and  has been authorized for detection and/or diagnosis of SARS-CoV-2 by FDA under an  Emergency Use Authorization (EUA). This EUA will remain  in effect (meaning this test can be used) for the duration of the COVID-19 declaration under Section 564(b)(1) of the Act, 21 U.S.C. section 360bbb-3(b)(1), unless the authorization is terminated or revoked sooner.    Influenza A by PCR NEGATIVE NEGATIVE Final   Influenza B by PCR NEGATIVE NEGATIVE Final    Comment: (NOTE) The Xpert Xpress SARS-CoV-2/FLU/RSV assay is intended as an aid in  the diagnosis of influenza from Nasopharyngeal swab specimens and  should not be used as a sole basis for treatment. Nasal washings and  aspirates are unacceptable for Xpert Xpress SARS-CoV-2/FLU/RSV  testing. Fact Sheet for Patients: PinkCheek.be Fact Sheet for Healthcare Providers: GravelBags.it This test is not yet approved or cleared by the [place] FDA and  has been authorized for detection and/or diagnosis of SARS-CoV-2 by  FDA under an Emergency Use Authorization (EUA). This EUA will remain  in effect (meaning this test can be used) for the duration of the  Covid-19 declaration under Section 564(b)(1) of the Act, 21  U.S.C. section 360bbb-3(b)(1), unless the authorization is  terminated or revoked. Performed at [facility], [place] [address] [place], [place] [address]          Radiology Studies: DG Chest 1 View  Result Date: 07/21/2019 CLINICAL DATA:  Decreased oxygen saturation. EXAM: CHEST  1 VIEW COMPARISON:  July 13, 2019 FINDINGS: Mildly increased lung markings are seen with very mild, stable left basilar atelectasis. There is no evidence of a  pleural effusion or pneumothorax. The cardiac silhouette is borderline in size. There is moderate severity calcification thoracic aorta. The visualized skeletal structures are unremarkable. IMPRESSION: 1. Very mild, stable left basilar atelectasis. Electronically Signed   By: Virgina Norfolk M.D.   On: 07/23/2019 18:16   CT CHEST WO CONTRAST  Result Date: 07/15/2019 CLINICAL DATA:  Hypotension and wheezing. EXAM: CT CHEST WITHOUT CONTRAST TECHNIQUE: Multidetector CT imaging of the chest was performed following the standard protocol without IV contrast. COMPARISON:  Chest x-ray from same day. CTA chest dated January 22, 2017. FINDINGS: Cardiovascular: Normal heart size. No pericardial effusion. Unchanged mild aneurysmal dilatation of the ascending thoracic aorta measuring up to 4.0 cm. Coronary, aortic arch, and branch vessel atherosclerotic vascular disease. Mediastinum/Nodes: No enlarged mediastinal or axillary lymph nodes. Thyroid gland, trachea, and esophagus demonstrate no significant findings. Lungs/Pleura: Bilateral lower lobe peribronchial thickening with small airways impaction and patchy ground-glass densities and subsegmental atelectasis in both lower lobes. Areas of centrilobular nodularity in the left lower lobe. Upper lobe predominant paraseptal and centrilobular emphysema. Unchanged 5 mm nodule in the right upper lobe (series 5, image 40), stable since 2018, benign. No pleural effusion or pneumothorax. Upper Abdomen: No acute abnormality. Musculoskeletal: No chest wall mass or suspicious bone lesions identified. IMPRESSION: 1. Bilateral lower lobe peribronchial thickening with small airways impaction, patchy ground-glass density, and centrilobular nodularity, suspicious for aspiration. 2. Unchanged ascending thoracic aortic aneurysm measuring 4.0 cm. 3.  Emphysema (ICD10-J43.9). 4.  Aortic atherosclerosis (ICD10-I70.0). Electronically Signed   By: Titus Dubin M.D.   On: 07/24/2019 21:31   DG  Chest Port 1 View  Result Date: 08/08/2019 CLINICAL DATA:  Cough. EXAM: PORTABLE CHEST 1 VIEW COMPARISON:  06/28/2019. FINDINGS: Mediastinum hilar structures normal. Cardiac monitoring device again noted. Stable cardiomegaly. Mild left base atelectasis/infiltrate. No pleural effusion or pneumothorax. Degenerative change thoracic spine. IMPRESSION: 1.  Stable cardiomegaly. 2.  Mild left base atelectasis/infiltrate. Electronically Signed   By: Marcello Moores  Register   On: 08/06/2019 14:20        Scheduled Meds: . acidophilus   Oral QHS  . chlorhexidine  15 mL Mouth Rinse BID  . cholecalciferol  1,000 Units Oral BID WC   And  . cholecalciferol  1,000 Units Oral Q lunch  . cyanocobalamin  1,000 mcg Intramuscular Q30 days  . ferrous sulfate  325 mg Oral TID WC  . ipratropium  1 spray Nasal BID  . loratadine  10 mg Oral QHS  . mouth rinse  15 mL Mouth Rinse q12n4p  . Melatonin  3 mg Oral QHS  . metoprolol tartrate  2.5 mg Intravenous Once  . midodrine  2.5 mg Oral Daily  . pantoprazole  40 mg Oral Daily  . polyvinyl alcohol  1 drop Both Eyes BID  . pravastatin  40 mg Oral QHS  . sodium chloride  1 g Oral Daily   Continuous Infusions: . cefTRIAXone (ROCEPHIN)  IV    . metronidazole 500 mg (07/15/19 0610)     LOS: 2 days    Time spent: 40 minutes    Irine Seal, MD [facility]  If 7PM-7AM, please contact night-coverage [email or web address] 07/15/2019, 11:40 AM

## 2019-07-16 DIAGNOSIS — E870 Hyperosmolality and hypernatremia: Secondary | ICD-10-CM

## 2019-07-16 DIAGNOSIS — R197 Diarrhea, unspecified: Secondary | ICD-10-CM | POA: Clinically undetermined

## 2019-07-16 LAB — CBC WITH DIFFERENTIAL/PLATELET
Abs Immature Granulocytes: 0.05 10*3/uL (ref 0.00–0.07)
Basophils Absolute: 0 10*3/uL (ref 0.0–0.1)
Basophils Relative: 0 %
Eosinophils Absolute: 0 10*3/uL (ref 0.0–0.5)
Eosinophils Relative: 0 %
HCT: 31.3 % — ABNORMAL LOW (ref 39.0–52.0)
Hemoglobin: 10.4 g/dL — ABNORMAL LOW (ref 13.0–17.0)
Immature Granulocytes: 1 %
Lymphocytes Relative: 10 %
Lymphs Abs: 0.5 10*3/uL — ABNORMAL LOW (ref 0.7–4.0)
MCH: 27.5 pg (ref 26.0–34.0)
MCHC: 33.2 g/dL (ref 30.0–36.0)
MCV: 82.8 fL (ref 80.0–100.0)
Monocytes Absolute: 0.5 10*3/uL (ref 0.1–1.0)
Monocytes Relative: 10 %
Neutro Abs: 4.4 10*3/uL (ref 1.7–7.7)
Neutrophils Relative %: 79 %
Platelets: 184 10*3/uL (ref 150–400)
RBC: 3.78 MIL/uL — ABNORMAL LOW (ref 4.22–5.81)
RDW: 15 % (ref 11.5–15.5)
WBC: 5.6 10*3/uL (ref 4.0–10.5)
nRBC: 0 % (ref 0.0–0.2)

## 2019-07-16 LAB — CULTURE, BLOOD (ROUTINE X 2)

## 2019-07-16 LAB — BASIC METABOLIC PANEL
Anion gap: 8 (ref 5–15)
BUN: 21 mg/dL (ref 8–23)
CO2: 24 mmol/L (ref 22–32)
Calcium: 7.8 mg/dL — ABNORMAL LOW (ref 8.9–10.3)
Chloride: 115 mmol/L — ABNORMAL HIGH (ref 98–111)
Creatinine, Ser: 1.13 mg/dL (ref 0.61–1.24)
GFR calc Af Amer: >60 mL/min (ref >60)
GFR calc non Af Amer: 56 mL/min — ABNORMAL LOW (ref >60)
Glucose, Bld: 141 mg/dL — ABNORMAL HIGH (ref 70–99)
Potassium: 3.1 mmol/L — ABNORMAL LOW (ref 3.5–5.1)
Sodium: 147 mmol/L — ABNORMAL HIGH (ref 135–145)

## 2019-07-16 LAB — C DIFFICILE QUICK SCREEN W PCR REFLEX
C Diff antigen: NEGATIVE
C Diff interpretation: NOT DETECTED
C Diff toxin: NEGATIVE

## 2019-07-16 LAB — MAGNESIUM: Magnesium: 1.9 mg/dL (ref 1.7–2.4)

## 2019-07-16 LAB — MRSA PCR SCREENING: MRSA by PCR: NEGATIVE

## 2019-07-16 LAB — PROCALCITONIN: Procalcitonin: 5.42 ng/mL

## 2019-07-16 MED ORDER — POTASSIUM CHLORIDE 20 MEQ PO PACK
40.0000 meq | PACK | Freq: Once | ORAL | Status: AC
  Administered 2019-07-16: 10:00:00 40 meq via ORAL
  Filled 2019-07-16: qty 2

## 2019-07-16 MED ORDER — LOPERAMIDE HCL 1 MG/7.5ML PO SUSP
2.0000 mg | Freq: Once | ORAL | Status: AC
  Administered 2019-07-16: 2 mg via ORAL
  Filled 2019-07-16: qty 15

## 2019-07-16 MED ORDER — DEXTROSE 5 % IV SOLN: INTRAVENOUS | Status: AC

## 2019-07-16 MED ORDER — LOPERAMIDE HCL 1 MG/7.5ML PO SUSP
2.0000 mg | ORAL | Status: DC | PRN
  Administered 2019-07-16 – 2019-07-17 (×3): 2 mg via ORAL
  Filled 2019-07-16 (×4): qty 15

## 2019-07-16 MED ORDER — MAGNESIUM SULFATE 2 GM/50ML IV SOLN
2.0000 g | Freq: Once | INTRAVENOUS | Status: AC
  Administered 2019-07-16: 2 g via INTRAVENOUS
  Filled 2019-07-16 (×2): qty 50

## 2019-07-16 NOTE — Progress Notes (Addendum)
  Speech Language Pathology Treatment: Dysphagia  Patient Details Name: Joe Glover MRN: [id number] DOB: [birth year] Today's Date: 07/16/2019 Time: [id number] SLP Time Calculation (min) (ACUTE ONLY): 26 min  Assessment / Plan / Recommendation Clinical Impression  Patient received in bed, wife present throughout session. Today's session focused on caregiver education. SLP reviewed and provided education pertaining to patient's recent MBS (07/15/2019). Wife verbalized adequate understanding of results of MBS and had questions answered. SLP provided education re: safest diet recommendations (dysphagia 1/HTL) based on results of objective swallowing study. Aspiration precautions of ensuring patient alert and sitting upright reviewed. Compensatory strategies of providing small bolus sizes, alternating liquids/solids and ensuring patient swallow occurred prior to providing additional boluses were reviewed. Wife reports patient tolerated his breakfast tray (puree solids/HTL) well.   During session, RN provided medications crushed in puree via spoon, patient opening oral cavity to accept bolus with min L sided anterior labial spillage. Pt demonstrated grossly adequate lingual manipulation and AP transfer prior to initiating the swallow. Pt with mod diffuse oral residue, able to clear with subsequent bites of applesauce. RN provided spoon sips of HTL to pt, patient with prolonged oral transit, suspected swallow initiation delay, but no overt s/s aspiration observed. RN  Was provided with verbal education re: ensuring patient swallows bolus before providing additional boluses.   Patient's wife educated and provided a written resource for thickener options and information about making homemade smoothies to maximize nutrition and hydration. Wife also provided with education re: avoiding using ice with thickened liquids and impact thickened liquids can have on maintaining adequate hydration.  She verbalized  understanding. Recommend patient continue dysphagia 1/HTL diet. Will defer speech/language eval as pt is at or near baseline.   No further ST services indicated at this time. Please re-consult ST should needs arise.    HPI HPI: Joe Glover is a 86 y.o. male with medical history significant of left intraparenchymal hemorrhage 06/18/19, Parkinson's with autonomic dysfunction, paroxysmal atrial fibrillation, sick sinus syndrome, orthostatic hypotension, pseudogout, subdural hematoma, hypertension difficult to manage secondary to intermittent orthostatic hypotension, history of frequent falls, sleep apnea. Admitted with generalized weakness, on puree diet with frequent coughing per wife. CXR Bilateral lower lobe peribronchial thickening with small airways impaction, patchy ground-glass density, and centrilobular nodularity, suspicious for aspiration. BSE 06/18/19 Per eval pt receives Botox injections, + cough with thin, tolerated nectar, ferquent belching. No indications pna, knowledgeable wife re: strategies, regular/thin recommended.       SLP Plan  Discharge SLP treatment due to (comment)       Recommendations  Diet recommendations: Honey-thick liquid;Dysphagia 1 (puree) Liquids provided via: Teaspoon Medication Administration: Crushed with puree Supervision: Full supervision/cueing for compensatory strategies Compensations: Minimize environmental distractions;Slow rate;Small sips/bites;Multiple dry swallows after each bite/sip;Follow solids with liquid;Clear throat intermittently Postural Changes and/or Swallow Maneuvers: Seated upright 90 degrees;Upright 30-60 min after meal                Oral Care Recommendations: Oral care QID Follow up Recommendations: 24 hour supervision/assistance SLP Visit Diagnosis: Dysphagia, oropharyngeal phase (R13.12) Plan: Discharge SLP treatment due to (comment)       [place], M.Ed., CCC-SLP Speech Therapy Acute  Rehabilitation [place] 07/16/2019, 10:36 AM

## 2019-07-16 NOTE — Progress Notes (Signed)
PROGRESS NOTE    Joe Glover  [id number] DOB: 03/02/1925 DOA: 07/25/2019 PCP: [name], MD   Brief Narrative:  HPI per Dr. [name] is a 86 y.o. male with medical history significant of paroxysmal atrial fibrillation, sick sinus syndrome, orthostatic hypotension and recurrent syncope, chronic hyponatremia, Parkinson's disease with autonomic dysfunction, dementia,hyperlipidemia, anemia, OSA, prostate cancer, subdural hematoma, subarachnoid hemorrhage, CVAwith residual dysarthria, and conditions listed below presenting to the ED for evaluation for increasing weakness.  Recently hospitalized for syncopal episode, found to have orthostatic hypotension secondary to advanced Parkinson disease, for which patient was started on midodrine also UTI-patient was discharged with Cipro.  Patient wife was at bedside, reported that patient has had worsening of weakness, and has been on pured diet but according to the wife who has been feeding the patient patient has had frequent cough even choking after eating.  Patient has had multiple episodes of loose diarrhea every day but denied any abdominal pain.  Patient wife reported she did not put the diarrhea is C. Difficile (she is confident of the smell of C. Difficile).  Wife denies and had any fever or chills at nursing home. ED Course: he was found to be hypotensive and hypoxia, code sepsis was called and patient was started on vancomycin and cefepime, 3 L IV fluid bolus was given and patient became hypertensive, tachycardia and wheezing.   Assessment & Plan:   Principal Problem:   Severe sepsis with acute organ dysfunction (HCC) Active Problems:   E coli bacteremia   Aspiration pneumonia (HCC)   Acute metabolic encephalopathy   OSA (obstructive sleep apnea)   Parkinson disease (HCC)   Essential hypertension   Orthostatic hypotension   PAF (paroxysmal atrial fibrillation) (HCC)   Sick sinus syndrome (HCC)   Sepsis (HCC)   Acute lower UTI   Protein-calorie malnutrition, severe   Diarrhea  1 severe sepsis with acute organ dysfunction likely secondary to E. Coli bacteremia/UTI/aspiration pneumonia Patient had presented to the ED due to increasing weakness, noted to be lethargic on admission and noted to be hypotensive and noted to have a fever with temperature as high as 102, lactic acid elevated at 2.6 on admission with a leukocytosis with a white count of 21, CT chest worrisome for pneumonia, urinalysis with large leukocytes, greater than 50 WBCs, many bacteria.  Blood cultures preliminary positive for E. coli.  Urine cultures with multiple species.  Patient with recent urine cultures that were positive for E. coli during last hospitalization.  Patient noted to be hypotensive received fluid bolus with some wheezing and worsening shortness of breath.  Blood pressure improved.  WBC trending down.  Lactic acid level trending down.  C. difficile PCR negative.  Discontinued IV vancomycin and IV cefepime.  Continue IV Rocephin.  Discontinue IV Flagyl. Will likely need treatment for 7 to 10 days.  Patient seen by speech therapy noted to be high risk for aspiration and placed on appropriate diet with aspiration precautions.  Follow.  2.  Orthostatic hypotension Patient noted to have a history of orthostatic hypotension.  Patient on presentation with systolic blood pressures in the 70s.  Patient given a fluid bolus however supposedly had some wheezing and shortness of breath which has since improved.  Blood pressure improved.  Continue midodrine.    3.  Acute metabolic encephalopathy Likely secondary to problem #1.  Patient more alert today and following commands.  Patient improving clinically.  Continue empiric IV antibiotics as in problem #1.  4.  Dysphagia Patient with dysphagia noted to be on pured diet per wife at home however per RN wife states patient has been having some episodes of choking even with a pured diet.   Patient has been assessed by speech therapy and patient noted to be a high aspiration risk.  Patient underwent modified barium swallow.  Speech therapy recommending dysphagia 1 diet with honey thick liquids.  SLP following.  5.  Parkinson's disease Outpatient follow-up.  6.  Paroxysmal atrial fibrillation Patient in sinus rhythm.  Patient noted to be tachycardic and due to dysphagia and mental status patient was started on amiodarone drip on admission.  Heart rate seems controlled.  Amiodarone drip has been discontinued as of 07/14/2019.  Heart rate controlled.  Patient a poor anticoagulation candidate due to frequent falls and history of GI bleed and intracranial bleed.  Outpatient follow-up with cardiology.  7.  Generalized weakness PT/OT.  Likely needs SNF  8.  Severe protein calorie malnutrition Patient with high risk for aspiration currently on a dysphagia 1 diet with honey thick liquids.  Patient seen by dietitian and placed on Magic cup 3 times daily with meals, multivitamin.  9.  Hypernatremia Likely secondary to dehydration secondary to volume loss as patient noted to have multiple loose stools overnight.  Placed on D5W at 50 cc an hour for 24 hours.  Monitor fluid status closely.  10.  Diarrhea Likely antibiotic induced.  C. difficile PCR negative.  Imodium 2 g p.o. x1 and then 2 g as needed.  11.  Prognosis Patient with multiple comorbidities, advanced age of 5, recent hospitalization for UTI, dysphagia now presenting with severe sepsis secondary to E. coli bacteremia, UTI, aspiration pneumonia.  Palliative care consultated for goals of care.   DVT prophylaxis: SCDs Code Status: DNR Family Communication: Updated wife at bedside. Disposition Plan: To be determined.   Consultants:   Palliative care: Devin Going, RN 07/15/2019  Procedures:   CT chest 07/20/2019  Chest x-ray 07/10/2019  Modified barium swallow 07/15/2019  Antimicrobials:  IV cefepime 07/15/2019>>>>  07/15/2019  IV vancomycin 07/15/2019>>>>> 07/15/2019  IV Flagyl 08/06/2019>>>>>>> 07/16/2019  IV Rocephin 07/15/2019   Subjective: Patient alert.  Following commands.  No chest pain.  No shortness of breath.  No abdominal pain.  Patient noted to have watery loose stools per RN x3 overnight.  Patient noted to have loose stool this morning.  Rectal tube dislodged.  Wife at bedside.   Objective: Vitals:   07/15/19 2033 07/16/19 0105 07/16/19 0416 07/16/19 0845  BP: (!) 166/99 (!) 149/90 (!) 153/81 (!) 152/93  Pulse: 95 (!) 51 (!) 57 79  Resp: 20 20 18  (!) 24  Temp: (!) 100.8 F (38.2 C) 98 F (36.7 C) 97.8 F (36.6 C) 99 F (37.2 C)  TempSrc: Oral Oral Oral Oral  SpO2: 98% 97% 98% 97%  Weight:   67.4 kg   Height:        Intake/Output Summary (Last 24 hours) at 07/16/2019 0948 Last data filed at 07/16/2019 0847 Gross per 24 hour  Intake 839.48 ml  Output 1201 ml  Net -361.52 ml   Filed Weights   07/14/19 1721 07/15/19 0459 07/16/19 0416  Weight: 71.2 kg 69.6 kg 67.4 kg    Examination:  General exam: Frail.  Chronically ill-appearing.  Extremely dry mucous membranes. Respiratory system: Decreased coarse breath sounds in the right base.  Fair air movement.  No wheezing.  Normal respiratory effort.  Cardiovascular system: RRR murmurs rubs or gallops.  No JVD.  No lower extremity edema.  Gastrointestinal system: Abdomen is nontender, nondistended, soft, positive bowel sounds.  No rebound.  No guarding.  Central nervous system: Alert and oriented. No focal neurological deficits. Extremities: Symmetric 5 x 5 power. Skin: No rashes, lesions or ulcers Psychiatry: Judgement and insight appear poor to fair. Mood & affect appropriate.     Data Reviewed: I have personally reviewed following labs and imaging studies  CBC: Recent Labs  Lab 07/20/2019 1111 07/14/19 0600 07/15/19 0504 07/16/19 0424  WBC 21.0* 16.7* 11.8* 5.6  NEUTROABS  --   --  10.7* 4.4  HGB 10.9* 9.7* 10.4* 10.4*  HCT  33.0* 29.9* 31.9* 31.3*  MCV 84.6 85.4 83.3 82.8  PLT 289 197 209 [id number]   Basic Metabolic Panel: Recent Labs  Lab 07/22/2019 1111 07/10/2019 1400 07/14/19 0600 07/15/19 0504 07/16/19 0424  NA 135  --  135 140 147*  K 3.8  --  3.5 3.9 3.1*  CL 101  --  103 108 115*  CO2 21*  --  21* 21* 24  GLUCOSE 131*  --  111* 81 141*  BUN 31*  --  39* 29* 21  CREATININE 1.46*  --  1.47* 1.24 1.13  CALCIUM 8.3*  --  7.7* 7.8* 7.8*  MG  --  1.6* 1.8 2.2 1.9   GFR: Estimated Creatinine Clearance: 39.8 mL/min (by C-G formula based on SCr of 1.13 mg/dL). Liver Function Tests: No results for input(s): AST, ALT, ALKPHOS, BILITOT, PROT, ALBUMIN in the last 168 hours. No results for input(s): LIPASE, AMYLASE in the last 168 hours. No results for input(s): AMMONIA in the last 168 hours. Coagulation Profile: Recent Labs  Lab 07/31/2019 1400 07/14/19 0600  INR 1.2 1.3*   Cardiac Enzymes: No results for input(s): CKTOTAL, CKMB, CKMBINDEX, TROPONINI in the last 168 hours. BNP (last 3 results) No results for input(s): PROBNP in the last 8760 hours. HbA1C: No results for input(s): HGBA1C in the last 72 hours. CBG: No results for input(s): GLUCAP in the last 168 hours. Lipid Profile: No results for input(s): CHOL, HDL, LDLCALC, TRIG, CHOLHDL, LDLDIRECT in the last 72 hours. Thyroid Function Tests: No results for input(s): TSH, T4TOTAL, FREET4, T3FREE, THYROIDAB in the last 72 hours. Anemia Panel: No results for input(s): VITAMINB12, FOLATE, FERRITIN, TIBC, IRON, RETICCTPCT in the last 72 hours. Sepsis Labs: Recent Labs  Lab 07/26/2019 1344 07/31/2019 1559 07/14/19 0600 07/15/19 0504 07/16/19 0424  PROCALCITON  --   --  28.60 12.26 5.42  LATICACIDVEN 2.6* 2.4* 1.2 1.0  --     Recent Results (from the past 240 hour(s))  Blood Culture (routine x 2)     Status: Abnormal (Preliminary result)   Collection Time: 07/21/2019  1:42 PM   Specimen: BLOOD  Result Value Ref Range Status   Specimen Description  BLOOD LEFT ANTECUBITAL  Final   Special Requests   Final    BOTTLES DRAWN AEROBIC AND ANAEROBIC Blood Culture adequate volume   Culture  Setup Time   Final    GRAM NEGATIVE RODS IN BOTH AEROBIC AND ANAEROBIC BOTTLES CRITICAL RESULT CALLED TO, READ BACK BY AND VERIFIED WITH: J.LEDFORD,PHARMD AT [place] ON 07/14/19 BY G.MCADOO Performed at [facility], [place] [address] [place], [place] [address]    Culture ESCHERICHIA COLI (A)  Final   Report Status PENDING  Incomplete   Organism ID, Bacteria ESCHERICHIA COLI  Final      Susceptibility   Escherichia coli - MIC*    AMPICILLIN >=32  RESISTANT Resistant     CEFAZOLIN 16 SENSITIVE Sensitive     CEFEPIME <=0.12 SENSITIVE Sensitive     CEFTAZIDIME <=1 SENSITIVE Sensitive     CEFTRIAXONE <=0.25 SENSITIVE Sensitive     CIPROFLOXACIN 1 SENSITIVE Sensitive     GENTAMICIN >=16 RESISTANT Resistant     IMIPENEM <=0.25 SENSITIVE Sensitive     TRIMETH/SULFA >=320 RESISTANT Resistant     AMPICILLIN/SULBACTAM >=32 RESISTANT Resistant     PIP/TAZO <=4 SENSITIVE Sensitive     * ESCHERICHIA COLI  Blood Culture ID Panel (Reflexed)     Status: Abnormal   Collection Time: 07/29/2019  1:42 PM  Result Value Ref Range Status   Enterococcus species NOT DETECTED NOT DETECTED Final   Listeria monocytogenes NOT DETECTED NOT DETECTED Final   Staphylococcus species NOT DETECTED NOT DETECTED Final   Staphylococcus aureus (BCID) NOT DETECTED NOT DETECTED Final   Streptococcus species NOT DETECTED NOT DETECTED Final   Streptococcus agalactiae NOT DETECTED NOT DETECTED Final   Streptococcus pneumoniae NOT DETECTED NOT DETECTED Final   Streptococcus pyogenes NOT DETECTED NOT DETECTED Final   Acinetobacter baumannii NOT DETECTED NOT DETECTED Final   Enterobacteriaceae species DETECTED (A) NOT DETECTED Final    Comment: Enterobacteriaceae represent a large family of gram-negative bacteria, not a single organism. CRITICAL RESULT CALLED TO, READ BACK BY AND VERIFIED  WITH: J.LEDFORD,PHARMD AT 0557 ON 07/14/19 BY G.MCADOO    Enterobacter cloacae complex NOT DETECTED NOT DETECTED Final   Escherichia coli DETECTED (A) NOT DETECTED Final    Comment: CRITICAL RESULT CALLED TO, READ BACK BY AND VERIFIED WITH: J.LEDFORD,PHARMD AT 0557 ON 07/14/19 BY G.MCADOO    Klebsiella oxytoca NOT DETECTED NOT DETECTED Final   Klebsiella pneumoniae NOT DETECTED NOT DETECTED Final   Proteus species NOT DETECTED NOT DETECTED Final   Serratia marcescens NOT DETECTED NOT DETECTED Final   Carbapenem resistance NOT DETECTED NOT DETECTED Final   Haemophilus influenzae NOT DETECTED NOT DETECTED Final   Neisseria meningitidis NOT DETECTED NOT DETECTED Final   Pseudomonas aeruginosa NOT DETECTED NOT DETECTED Final   Candida albicans NOT DETECTED NOT DETECTED Final   Candida glabrata NOT DETECTED NOT DETECTED Final   Candida krusei NOT DETECTED NOT DETECTED Final   Candida parapsilosis NOT DETECTED NOT DETECTED Final   Candida tropicalis NOT DETECTED NOT DETECTED Final    Comment: Performed at [facility], [place] [address] [place], [place] [address]  Blood Culture (routine x 2)     Status: Abnormal   Collection Time: 07/26/2019  2:12 PM   Specimen: BLOOD LEFT FOREARM  Result Value Ref Range Status   Specimen Description BLOOD LEFT FOREARM  Final   Special Requests   Final    BOTTLES DRAWN AEROBIC AND ANAEROBIC Blood Culture results may not be optimal due to an inadequate volume of blood received in culture bottles   Culture  Setup Time   Final    GRAM NEGATIVE RODS ANAEROBIC BOTTLE ONLY CRITICAL VALUE NOTED.  VALUE IS CONSISTENT WITH PREVIOUSLY REPORTED AND CALLED VALUE.    Culture (A)  Final    ESCHERICHIA COLI SUSCEPTIBILITIES PERFORMED ON PREVIOUS CULTURE WITHIN THE LAST 5 DAYS. Performed at [facility], [place] [address] [place], [place] [address]    Report Status 07/16/2019 FINAL  Final  Urine culture     Status: Abnormal   Collection Time: 07/12/2019  5:23 PM    Specimen: In/Out Cath Urine  Result Value Ref Range Status   Specimen Description IN/OUT  CATH URINE  Final   Special Requests   Final    NONE Performed at [facility], [place] [address], [place], [place] [address]    Culture MULTIPLE SPECIES PRESENT, SUGGEST RECOLLECTION (A)  Final   Report Status 07/15/2019 FINAL  Final  Respiratory Panel by RT PCR (Flu A&B, Covid) - Nasopharyngeal Swab     Status: None   Collection Time: 07/16/2019  6:29 PM   Specimen: Nasopharyngeal Swab  Result Value Ref Range Status   SARS Coronavirus 2 by RT PCR NEGATIVE NEGATIVE Final    Comment: (NOTE) SARS-CoV-2 target nucleic acids are NOT DETECTED. The SARS-CoV-2 RNA is generally detectable in upper respiratoy specimens during the acute phase of infection. The lowest concentration of SARS-CoV-2 viral copies this assay can detect is 131 copies/mL. A negative result does not preclude SARS-Cov-2 infection and should not be used as the sole basis for treatment or other patient management decisions. A negative result may occur with  improper specimen collection/handling, submission of specimen other than nasopharyngeal swab, presence of viral mutation(s) within the areas targeted by this assay, and inadequate number of viral copies (<131 copies/mL). A negative result must be combined with clinical observations, patient history, and epidemiological information. The expected result is Negative. Fact Sheet for Patients:  PinkCheek.be Fact Sheet for Healthcare Providers:  GravelBags.it This test is not yet ap proved or cleared by the [place] FDA and  has been authorized for detection and/or diagnosis of SARS-CoV-2 by FDA under an Emergency Use Authorization (EUA). This EUA will remain  in effect (meaning this test can be used) for the duration of the COVID-19 declaration under Section 564(b)(1) of the Act, 21 U.S.C. section 360bbb-3(b)(1),  unless the authorization is terminated or revoked sooner.    Influenza A by PCR NEGATIVE NEGATIVE Final   Influenza B by PCR NEGATIVE NEGATIVE Final    Comment: (NOTE) The Xpert Xpress SARS-CoV-2/FLU/RSV assay is intended as an aid in  the diagnosis of influenza from Nasopharyngeal swab specimens and  should not be used as a sole basis for treatment. Nasal washings and  aspirates are unacceptable for Xpert Xpress SARS-CoV-2/FLU/RSV  testing. Fact Sheet for Patients: PinkCheek.be Fact Sheet for Healthcare Providers: GravelBags.it This test is not yet approved or cleared by the [place] FDA and  has been authorized for detection and/or diagnosis of SARS-CoV-2 by  FDA under an Emergency Use Authorization (EUA). This EUA will remain  in effect (meaning this test can be used) for the duration of the  Covid-19 declaration under Section 564(b)(1) of the Act, 21  U.S.C. section 360bbb-3(b)(1), unless the authorization is  terminated or revoked. Performed at [facility], [place] [address], [place], [place] [address]   MRSA PCR Screening     Status: None   Collection Time: 07/15/19 10:30 PM   Specimen: Nasal Mucosa; Nasopharyngeal  Result Value Ref Range Status   MRSA by PCR NEGATIVE NEGATIVE Final    Comment:        The GeneXpert MRSA Assay (FDA approved for NASAL specimens only), is one component of a comprehensive MRSA colonization surveillance program. It is not intended to diagnose MRSA infection nor to guide or monitor treatment for MRSA infections. Performed at [facility], [place] [address], [place], [place] [address]          Radiology Studies: DG Swallowing Func-Speech Pathology  Result Date: 07/15/2019 Objective Swallowing Evaluation: Type of Study: MBS-Modified Barium Swallow Study  Patient Details Name:  Joe Glover MRN: [id number] Date of Birth: 1924/11/06 Today's Date: 07/15/2019 Time: SLP  Start Time (ACUTE ONLY): 1015 -SLP Stop Time (ACUTE ONLY): 1100 SLP Time Calculation (min) (ACUTE ONLY): 45 min Past Medical History: Past Medical History: Diagnosis Date . Awareness alteration, transient 11/02/2014 . Cellulitis of right hand 01/27/2017 . Colon cancer ([place])  . Essential hypertension 09/23/2012 . Fall 09/16/2016 . Fall from standing 02/25/2017 . GI bleed  . Hyperlipidemia  . Hypertension  . Insomnia 08/13/2016 . Normocytic anemia 01/27/2017 . On amiodarone therapy 08/06/2015 . Orthostatic hypotension 08/06/2015 . OSA (obstructive sleep apnea)  . PAF (paroxysmal atrial fibrillation) ([place])  . Parkinson disease ([place])  . Prostate cancer ([place])  . SDH (subdural hematoma) ([place]) 09/16/2016 . Sepsis ([place]) 07/14/2019 . Status post placement of implantable loop recorder 07/11/2015 . Stroke ([place])  . 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 Subdural hematoma ([place]) 09/05/2016 . Syncope 11/02/2014 . TIA (transient ischemic attack)  . Vitamin D deficiency  Past Surgical History: Past Surgical History: Procedure Laterality Date . COLECTOMY   . HERNIA REPAIR   . ORCHIECTOMY   . PROSTATECTOMY   . TOTAL HIP ARTHROPLASTY Right 11/11/2018  Procedure: TOTAL HIP ARTHROPLASTY ANTERIOR APPROACH;  Surgeon: Gaynelle Arabian, MD;  Location: [facility]S;  Service: Orthopedics;  Laterality: Right;  100 HPI: Grayton Rubey is a 86 y.o. male with medical history significant of left intraparenchymal hemorrhage 06/18/19, Parkinson's with autonomic dysfunction, paroxysmal atrial fibrillation, sick sinus syndrome, orthostatic hypotension, pseudogout, subdural hematoma, hypertension difficult to manage secondary to intermittent orthostatic hypotension, history of frequent falls, sleep apnea. Admitted with generalized weakness, on puree diet with frequent coughing per wife. CXR Bilateral lower lobe peribronchial thickening with small airways impaction, patchy ground-glass  density, and centrilobular nodularity, suspicious for aspiration. BSE 06/18/19 Per eval pt receives Botox injections, + cough with thin, tolerated nectar, ferquent belching. No indications pna, knowledgeable wife re: strategies, regular/thin recommended.  Subjective: Pt seen in radiology for MBS. Wife in attendance,. Assessment / Plan / Recommendation CHL IP CLINICAL IMPRESSIONS 07/15/2019 Clinical Impression Pt was seen for MBS to objectively assess swallow function and safety, and to identify least restrictive diet. Wife was present during this assessment. Pt accepted trials of nectar thick and honey thick liquids, and puree textures. Pt presents with significant oral and pharyngeal dysphagia, characterized as follows: ORALLY, pt's lingual and velar movement is markedly restricted, resulting in poor bolus control, formation, and posterior propulsion, as well as premature spillage and residue on the lingual and velar surfaces. Despite flaccidity of the soft palate, Velopharyngeal closure appears adequate, as pt does not exhibit nasal regurgitation. PHARYNGEAL swallow is characterized by trigger of the reflex at the level of the vallecular sinus across consistencies. Weakness is apparent, and worsens as the study progresses. Muscular strength is inadequate for stripping and airway closure, resulting in post-swallow residue in the vallecula and pyriforms, which increases with additional boluses and secretions. Penetration of nectar and honey thick liquids was noted during the swallow, with immediate sensation and throat clear, however, as the study progressed, pt was less able to clear penetrate. No penetration was seen on puree consistency, however, the risk is still elevated. Thin liquids, solid textures and barium tablet were not provided during this assessment due to high risk of aspiration. At this time, puree diet and honey thick liquids via teaspoon is recommended, with meds crushed in puree. Given advanced age  and Parkinson's, fatigue during meals  with consequent increase in aspiration risk is highly likely. In addition, pt will have difficulty meeting his nutrition and hydration needs on a diet this restrictive. Significant improvement in function and safety is unlikely. Palliative Care consult is recommended to facilitate establishment of appropriate goals of care for this sweet gentleman and his wife, who is attentive, caring and concerned. Wife was provided with extensive education using review of MBS videos, and was given written safe swallow strategies. SLP will continue to follow to provide education.  SLP Visit Diagnosis Dysphagia, oropharyngeal phase (R13.12)     Impact on safety and function Severe aspiration risk;Risk for inadequate nutrition/hydration   CHL IP TREATMENT RECOMMENDATION 07/15/2019 Treatment Recommendations Therapy as outlined in treatment plan below   Prognosis 07/15/2019 Prognosis for Safe Diet Advancement Guarded Barriers to Reach Goals Severity of deficits, Parkinson's, advanced age   CHL IP DIET RECOMMENDATION 07/15/2019 SLP Diet Recommendations Dysphagia 1 (Puree) solids;Honey thick liquids Liquid Administration via Spoon Medication Administration Crushed with puree Compensations Minimize environmental distractions;Slow rate;Small sips/bites;Multiple dry swallows after each bite/sip;Follow solids with liquid;Clear throat intermittently Postural Changes Remain semi-upright after after feeds/meals (Comment);Seated upright at 90 degrees   CHL IP OTHER RECOMMENDATIONS 07/15/2019 Recommended Consults Palliative on board Oral Care Recommendations Oral care QID;Staff/trained caregiver to provide oral care Other Recommendations Order thickener from pharmacy;Have oral suction available   CHL IP FOLLOW UP RECOMMENDATIONS 07/15/2019 Follow up Recommendations 24 hour supervision/assistance   CHL IP FREQUENCY AND DURATION 07/15/2019 Speech Therapy Frequency (ACUTE ONLY) min 2x/week Treatment Duration 1 week;2 weeks       CHL IP ORAL PHASE 07/15/2019 Oral Phase Impaired     Oral - Honey Teaspoon, cup Weak lingual manipulation;Incomplete tongue to palate contact;Reduced posterior propulsion;Lingual/palatal residue;Piecemeal swallowing;Delayed oral transit;Decreased bolus cohesion;Premature spillage     Oral - Nectar Teaspoon, Cup Weak lingual manipulation;Incomplete tongue to palate contact;Reduced posterior propulsion;Holding of bolus;Lingual/palatal residue;Delayed oral transit;Decreased bolus cohesion;Premature spillage         Oral - Puree Impaired mastication;Weak lingual manipulation;Incomplete tongue to palate contact;Reduced posterior propulsion;Lingual/palatal residue;Delayed oral transit;Decreased bolus cohesion;Premature spillage  CHL IP CERVICAL ESOPHAGEAL PHASE 07/15/2019 Cervical Esophageal Phase Impaired Honey Cup Reduced cricopharyngeal relaxation Nectar Cup Reduced cricopharyngeal relaxation Puree Reduced cricopharyngeal relaxation Enriqueta Shutter, MSP, CCC-SLP Speech Language Pathologist Office: [phone] Pager: [phone] Shonna Chock 07/15/2019, 12:26 PM                   Scheduled Meds: . acidophilus   Oral QHS  . chlorhexidine  15 mL Mouth Rinse BID  . cholecalciferol  1,000 Units Oral BID WC   And  . cholecalciferol  1,000 Units Oral Q lunch  . [START ON 07/21/2019] cyanocobalamin  1,000 mcg Intramuscular Q30 days  . ferrous sulfate  325 mg Oral TID WC  . ipratropium  1 spray Nasal BID  . loratadine  10 mg Oral QHS  . mouth rinse  15 mL Mouth Rinse q12n4p  . Melatonin  3 mg Oral QHS  . metoprolol tartrate  2.5 mg Intravenous Once  . midodrine  2.5 mg Oral Daily  . pantoprazole  40 mg Oral Daily  . polyvinyl alcohol  1 drop Both Eyes BID  . potassium chloride  40 mEq Oral Once  . pravastatin  40 mg Oral QHS  . sodium chloride  1 g Oral Daily   Continuous Infusions: . sodium chloride 10 mL/hr at 07/16/19 0332  . cefTRIAXone (ROCEPHIN)  IV 2 g (07/15/19 1216)  . dextrose    .  magnesium sulfate bolus IVPB    . metronidazole 500 mg (07/16/19 0534)     LOS: 3 days    Time spent: 40 minutes    Irine Seal, MD [facility]  If 7PM-7AM, please contact night-coverage [email or web address] 07/16/2019, 9:48 AM

## 2019-07-16 NOTE — Progress Notes (Signed)
Indwelling Fecal containment device placed.

## 2019-07-16 NOTE — Evaluation (Signed)
Occupational Therapy Evaluation Patient Details Name: Joe Glover MRN: [id number] DOB: 1924/09/05 Today's Date: 07/16/2019    History of Present Illness Pt is 86 y.o. male with medical history significant of paroxysmal atrial fibrillation, sick sinus syndrome, orthostatic hypotension and recurrent syncope, chronic hyponatremia, Parkinson's disease with autonomic dysfunction, dementia, hyperlipidemia, anemia, OSA, prostate cancer, subdural hematoma, subarachnoid hemorrhage, CVA with residual dysarthria, and for full PMH see H and P.  Presented with weakness and admitted with sepsis with organ dysfunction and orthostatic hypotension.   Clinical Impression   Pt walks with a walker and close supervision at home and is assisted for some aspects of showering and dressing (see below for specifics). He is dependent in all IADL. Pt presents with generalized weakness, poor standing balance and requires set up to total assist for ADL. Recommending return to SNF to further rehab prior to return home with his supportive wife. Will follow acutely.    Follow Up Recommendations  SNF;Supervision/Assistance - 24 hour    Equipment Recommendations  Other (comment)(defer to next venue)    Recommendations for Other Services       Precautions / Restrictions Precautions Precautions: Fall Precaution Comments: monitor for orthostatic hypotension      Mobility Bed Mobility Overal bed mobility: Needs Assistance Bed Mobility: Supine to Sit;Sit to Supine     Supine to sit: Min assist Sit to supine: Min assist   General bed mobility comments: HOB elevated, increased time  Transfers Overall transfer level: Needs assistance Equipment used: Rolling walker (2 wheeled) Transfers: Sit to/from Stand Sit to Stand: Mod assist;From elevated surface         General transfer comment: took several steps to [place] Overall balance assessment: Needs assistance   Sitting balance-Leahy Scale: Fair       Standing balance-Leahy Scale: Poor                             ADL either performed or assessed with clinical judgement   ADL Overall ADL's : Needs assistance/impaired Eating/Feeding: Total assistance Eating/Feeding Details (indicate cue type and reason): assisted due to need to follow swallowing precautions Grooming: Wash/dry hands;Wash/dry face;Sitting;Set up   Upper Body Bathing: Moderate assistance;Sitting Upper Body Bathing Details (indicate cue type and reason): assist for back Lower Body Bathing: Maximal assistance;Sit to/from stand   Upper Body Dressing : Minimal assistance;Sitting   Lower Body Dressing: Maximal assistance;Sit to/from stand       Toileting- Water quality scientist and Hygiene: Maximal assistance;Sit to/from stand               Vision Baseline Vision/History: Wears glasses Wears Glasses: At all times Patient Visual Report: No change from baseline       Perception     Praxis      Pertinent Vitals/Pain Pain Assessment: No/denies pain     Hand Dominance Right   Extremity/Trunk Assessment Upper Extremity Assessment Upper Extremity Assessment: Generalized weakness   Lower Extremity Assessment Lower Extremity Assessment: Defer to PT evaluation   Cervical / Trunk Assessment Cervical / Trunk Assessment: Kyphotic   Communication Communication Communication: Expressive difficulties(dysarthric)   Cognition Arousal/Alertness: Awake/alert Behavior During Therapy: WFL for tasks assessed/performed Overall Cognitive Status: Difficult to assess                                     General Comments  Exercises     Shoulder Instructions      Home Living Family/patient expects to be discharged to:: Skilled nursing facility Living Arrangements: Spouse/significant other Available Help at Discharge: Family;Available 24 hours/day Type of Home: House Home Access: Level entry;Ramped entrance     Home Layout:  Multi-level Alternate Level Stairs-Number of Steps: split level-7 up to bedroom (he was doing this 3 x day) Alternate Level Stairs-Rails: Right;Left Bathroom Shower/Tub: Tub/shower unit   Bathroom Toilet: Handicapped height     Home Equipment: Environmental consultant - 2 wheels;Cane - single point;Bedside commode;Grab bars - tub/shower;Shower seat;Hand held shower head;Grab bars - toilet;Transport chair   Additional Comments: Has a RW and BSC on every level; has had hospital bed in living room (rented) if needed      Prior Functioning/Environment Level of Independence: Needs assistance  Gait / Transfers Assistance Needed: Prior to recent hospitalizations he could walk with assist in house; the past few weeks has been in hospital or rehab and walking some with therapy ADL's / Homemaking Assistance Needed: wife washes back and bottom, assists with drying him and with dressing--particularly compression hose, self feeds with set up but tends to eat large bites, wife does all IADL             OT Problem List: Decreased strength;Impaired balance (sitting and/or standing)      OT Treatment/Interventions: Self-care/ADL training;Therapeutic activities;Patient/family education;Balance training;DME and/or AE instruction    OT Goals(Current goals can be found in the care plan section) Acute Rehab OT Goals Patient Stated Goal: return to [facility]/or SNF then home OT Goal Formulation: With patient/family Time For Goal Achievement: 07/30/19 Potential to Achieve Goals: Good ADL Goals Pt Will Perform Grooming: with min guard assist;standing Pt Will Perform Upper Body Dressing: with supervision;with set-up;sitting Pt Will Perform Lower Body Dressing: with mod assist;sit to/from stand Pt Will Transfer to Toilet: with min guard assist;ambulating;bedside commode  OT Frequency: Min 2X/week   Barriers to D/C:            Co-evaluation              AM-PAC OT "6 Clicks" Daily Activity     Outcome Measure  Help from another person eating meals?: Total Help from another person taking care of personal grooming?: A Little Help from another person toileting, which includes using toliet, bedpan, or urinal?: Total Help from another person bathing (including washing, rinsing, drying)?: A Lot Help from another person to put on and taking off regular upper body clothing?: A Little Help from another person to put on and taking off regular lower body clothing?: Total 6 Click Score: 11   End of Session Equipment Utilized During Treatment: Rolling walker;Gait belt  Activity Tolerance: Patient tolerated treatment well Patient left: in bed;with call bell/phone within reach;with family/visitor present  OT Visit Diagnosis: Unsteadiness on feet (R26.81);Other abnormalities of gait and mobility (R26.89);Repeated falls (R29.6);History of falling (Z91.81)                Time: [id number] OT Time Calculation (min): 23 min Charges:  OT General Charges $OT Visit: 1 Visit OT Evaluation $OT Eval Moderate Complexity: 1 Mod OT Treatments $Self Care/Home Management : 8-22 mins  Nestor Lewandowsky, OTR/L Acute Rehabilitation Services Pager: [phone] Office: [phone]  Malka So 07/16/2019, 2:27 PM

## 2019-07-16 NOTE — Plan of Care (Signed)
  Problem: Education: Goal: Knowledge of General Education information will improve Description: Including pain rating scale, medication(s)/side effects and non-pharmacologic comfort measures Outcome: Progressing   Problem: Health Behavior/Discharge Planning: Goal: Ability to manage health-related needs will improve Outcome: Progressing   Problem: Activity: Goal: Risk for activity intolerance will decrease Outcome: Progressing   Problem: Nutrition: Goal: Adequate nutrition will be maintained Outcome: Progressing   Problem: Elimination: Goal: Will not experience complications related to bowel motility Outcome: Progressing   Problem: Elimination: Goal: Will not experience complications related to urinary retention Outcome: Progressing   Problem: Pain Managment: Goal: General experience of comfort will improve Outcome: Progressing   Problem: Safety: Goal: Ability to remain free from injury will improve Outcome: Progressing

## 2019-07-16 NOTE — Progress Notes (Signed)
Patient has had 2 looses stools, placed on enteric precautions. Will get rectal tube if patient has another loose stool.

## 2019-07-17 DIAGNOSIS — E876 Hypokalemia: Secondary | ICD-10-CM

## 2019-07-17 DIAGNOSIS — E870 Hyperosmolality and hypernatremia: Secondary | ICD-10-CM | POA: Diagnosis not present

## 2019-07-17 LAB — BASIC METABOLIC PANEL
Anion gap: 8 (ref 5–15)
Anion gap: 11 (ref 5–15)
BUN: 16 mg/dL (ref 8–23)
BUN: 17 mg/dL (ref 8–23)
CO2: 22 mmol/L (ref 22–32)
CO2: 18 mmol/L — ABNORMAL LOW (ref 22–32)
Calcium: 7.8 mg/dL — ABNORMAL LOW (ref 8.9–10.3)
Calcium: 7.8 mg/dL — ABNORMAL LOW (ref 8.9–10.3)
Chloride: 119 mmol/L — ABNORMAL HIGH (ref 98–111)
Chloride: 121 mmol/L — ABNORMAL HIGH (ref 98–111)
Creatinine, Ser: 1.03 mg/dL (ref 0.61–1.24)
Creatinine, Ser: 1.02 mg/dL (ref 0.61–1.24)
GFR calc Af Amer: >60 mL/min (ref >60)
GFR calc Af Amer: >60 mL/min (ref >60)
GFR calc non Af Amer: >60 mL/min (ref >60)
GFR calc non Af Amer: >60 mL/min (ref >60)
Glucose, Bld: 144 mg/dL — ABNORMAL HIGH (ref 70–99)
Glucose, Bld: 156 mg/dL — ABNORMAL HIGH (ref 70–99)
Potassium: 3 mmol/L — ABNORMAL LOW (ref 3.5–5.1)
Potassium: 3.3 mmol/L — ABNORMAL LOW (ref 3.5–5.1)
Sodium: 149 mmol/L — ABNORMAL HIGH (ref 135–145)
Sodium: 150 mmol/L — ABNORMAL HIGH (ref 135–145)

## 2019-07-17 LAB — CBC WITH DIFFERENTIAL/PLATELET
Abs Immature Granulocytes: 0.07 10*3/uL (ref 0.00–0.07)
Basophils Absolute: 0 10*3/uL (ref 0.0–0.1)
Basophils Relative: 1 %
Eosinophils Absolute: 0 10*3/uL (ref 0.0–0.5)
Eosinophils Relative: 0 %
HCT: 30.7 % — ABNORMAL LOW (ref 39.0–52.0)
Hemoglobin: 10.3 g/dL — ABNORMAL LOW (ref 13.0–17.0)
Immature Granulocytes: 1 %
Lymphocytes Relative: 13 %
Lymphs Abs: 0.7 10*3/uL (ref 0.7–4.0)
MCH: 27.8 pg (ref 26.0–34.0)
MCHC: 33.6 g/dL (ref 30.0–36.0)
MCV: 83 fL (ref 80.0–100.0)
Monocytes Absolute: 0.7 10*3/uL (ref 0.1–1.0)
Monocytes Relative: 14 %
Neutro Abs: 3.5 10*3/uL (ref 1.7–7.7)
Neutrophils Relative %: 71 %
Platelets: 150 10*3/uL (ref 150–400)
RBC: 3.7 MIL/uL — ABNORMAL LOW (ref 4.22–5.81)
RDW: 15.2 % (ref 11.5–15.5)
WBC: 4.9 10*3/uL (ref 4.0–10.5)
nRBC: 0 % (ref 0.0–0.2)

## 2019-07-17 LAB — MAGNESIUM: Magnesium: 2 mg/dL (ref 1.7–2.4)

## 2019-07-17 MED ORDER — FUROSEMIDE 10 MG/ML IJ SOLN
60.0000 mg | Freq: Once | INTRAMUSCULAR | Status: AC
  Administered 2019-07-18: 60 mg via INTRAVENOUS
  Filled 2019-07-17: qty 6

## 2019-07-17 MED ORDER — POTASSIUM CHLORIDE 20 MEQ PO PACK
40.0000 meq | PACK | Freq: Once | ORAL | Status: AC
  Administered 2019-07-17: 09:00:00 40 meq
  Filled 2019-07-17: qty 2

## 2019-07-17 MED ORDER — POTASSIUM CHLORIDE 20 MEQ PO PACK: 40.0000 meq | PACK | Freq: Once | ORAL | Status: DC

## 2019-07-17 MED ORDER — ENOXAPARIN SODIUM 30 MG/0.3ML ~~LOC~~ SOLN
30.0000 mg | SUBCUTANEOUS | Status: DC
  Administered 2019-07-17: 15:00:00 30 mg via SUBCUTANEOUS
  Filled 2019-07-17: qty 0.3

## 2019-07-17 MED ORDER — DEXTROSE 5 % IV SOLN: INTRAVENOUS | Status: DC

## 2019-07-17 MED ORDER — POTASSIUM CHLORIDE 20 MEQ PO PACK
40.0000 meq | PACK | Freq: Once | ORAL | Status: AC
  Administered 2019-07-17: 17:00:00 40 meq via ORAL
  Filled 2019-07-17: qty 2

## 2019-07-17 NOTE — Progress Notes (Signed)
Patient with sodium level of 149 at last lab draw. Pt received D5 over night, infusion since stopped. No recheck of sodium yet, will hold daily dose of sodium chloride tablet unless advised otherwise by MD later, or recheck labs indicates its necessary. Wife at bedside aware of numbers, and in agreement with holding this home medication.

## 2019-07-17 NOTE — Plan of Care (Signed)
  Problem: Clinical Measurements: Goal: Ability to maintain clinical measurements within normal limits will improve Outcome: Progressing   

## 2019-07-17 NOTE — Progress Notes (Addendum)
Family VTE Concern  Patient's wife states that he has been advised to never even take aspirin due to hx of GI bleed/risk of bleeding. lovenox not administered yet due to wife's hesitation/report.  Page to MD to notify of this.

## 2019-07-17 NOTE — Progress Notes (Addendum)
   07/17/19 1534  MEWS Assessment  Is this an acute change? No     Vital Signs MEWS/VS Documentation       07/17/2019 0417 07/17/2019 0910 07/17/2019 1302 07/17/2019 1516   MEWS Score:  0  1  2  0   MEWS Score Color:  Green  Green  Yellow  Green   Resp:  18  --  (!) 24  18   Pulse:  87  --  (!) 108  93   BP:  122/78  --  (!) 134/93  (!) 144/97    Temp:  (!) 97.5 F (36.4 C)  --  98 F (36.7 C)  98 F (36.7 C)   O2 Device:  Room Air  --  Room Air  Room Air   Level of Consciousness:  --  Responds to Voice  --  --       Patient resting. Per wife this is "new baseline" as pt has been "very sleepy" since his December fall. Pt receiving IVF and midodrine. Will monitor vitals. Pt "felt warm" in assessing it was limited to the left forearm which is now visibly red, note made. Will pass on at shift report.   Will continue to monitor the vitals.      Maud Deed Tobias-Diakun 07/17/2019,3:34 PM

## 2019-07-17 NOTE — Progress Notes (Signed)
Pt eating meal during VS evaluation. Will monitor VS.  07/17/19 1316  MEWS Assessment  Is this an acute change? No      Vital Signs MEWS/VS Documentation      07/17/2019 0118 07/17/2019 0200 07/17/2019 0417 07/17/2019 1302   MEWS Score:  1  1  0  2   MEWS Score Color:  Green  Green  Green  Yellow   Resp:  --  --  18  (!) 24   Pulse:  92  90  87  (!) 108   BP:  131/69  112/73  122/78  (!) 134/93   Temp:  --  --  (!) 97.5 F (36.4 C)  98 F (36.7 C)   O2 Device:  --  --  Room [employer]   Level of Consciousness:  --  --  --  Alert           Maud Deed Tobias-Diakun 07/17/2019,1:19 PM

## 2019-07-17 NOTE — Progress Notes (Signed)
   Vital Signs MEWS/VS Documentation      07/17/2019 1516 07/17/2019 1629 07/17/2019 1705 07/17/2019 1936   MEWS Score:  1  1  1  2    MEWS Score Color:  Nyoka Cowden  Green  Green  Yellow   Resp:  18  18  20   (!) 21   Pulse:  93  90  93  93   BP:  (!) 144/97  (!) 145/84  (!) 147/90  120/82   Temp:  98 F (36.7 C)  98.9 F (37.2 C)  98.6 F (37 C)  98.2 F (36.8 C)   O2 Device:  Room Air  Room Health visitor  Room Air   Level of Consciousness:  Responds to Voice  Responds to Voice  --  --        Will continue to monitor throughout the night.   Tristan Schroeder 07/17/2019,7:50 PM

## 2019-07-17 NOTE — Progress Notes (Signed)
Patient sleeping during shift report.      

## 2019-07-17 NOTE — Progress Notes (Signed)
Discussed lovenox purpose/dose with wife at bedside again, reiterated the benefits of having it and risks of not. Pt's wife decides ultimately if its recommended she would like to proceed with the patient receiving it.   Continued with daily lovenox dose as originally ordered. No further changes to VTE orders.

## 2019-07-17 NOTE — Progress Notes (Signed)
Pt has home machine and his wife places him on it each night. RT checked to see if pt needed any assistance, pt stated no he was fine. RT will continue to monitor.

## 2019-07-17 NOTE — Progress Notes (Signed)
Pt has home CPAP at bedside. Wife places pt on without difficulty. Pt respiratory status is stable at this time. RT will continue to monitor.

## 2019-07-17 NOTE — Progress Notes (Signed)
PROGRESS NOTE    Joe Glover  [id number] DOB: May 26, 1925 DOA: 07/30/2019 PCP: [name], MD   Brief Narrative:  HPI per Dr. [name] is a 86 y.o. male with medical history significant of paroxysmal atrial fibrillation, sick sinus syndrome, orthostatic hypotension and recurrent syncope, chronic hyponatremia, Parkinson's disease with autonomic dysfunction, dementia,hyperlipidemia, anemia, OSA, prostate cancer, subdural hematoma, subarachnoid hemorrhage, CVAwith residual dysarthria, and conditions listed below presenting to the ED for evaluation for increasing weakness.  Recently hospitalized for syncopal episode, found to have orthostatic hypotension secondary to advanced Parkinson disease, for which patient was started on midodrine also UTI-patient was discharged with Cipro.  Patient wife was at bedside, reported that patient has had worsening of weakness, and has been on pured diet but according to the wife who has been feeding the patient patient has had frequent cough even choking after eating.  Patient has had multiple episodes of loose diarrhea every day but denied any abdominal pain.  Patient wife reported she did not put the diarrhea is C. Difficile (she is confident of the smell of C. Difficile).  Wife denies and had any fever or chills at nursing home. ED Course: he was found to be hypotensive and hypoxia, code sepsis was called and patient was started on vancomycin and cefepime, 3 L IV fluid bolus was given and patient became hypertensive, tachycardia and wheezing.   Assessment & Plan:   Principal Problem:   Severe sepsis with acute organ dysfunction (HCC) Active Problems:   E coli bacteremia   Aspiration pneumonia (HCC)   Acute metabolic encephalopathy   OSA (obstructive sleep apnea)   Parkinson disease (HCC)   Hypokalemia   Essential hypertension   Orthostatic hypotension   PAF (paroxysmal atrial fibrillation) (HCC)   Sick sinus syndrome (HCC)  Sepsis (HCC)   Acute lower UTI   Protein-calorie malnutrition, severe   Diarrhea   Hypernatremia  1 severe sepsis with acute organ dysfunction likely secondary to E. Coli bacteremia/UTI/aspiration pneumonia Patient had presented to the ED due to increasing weakness, noted to be lethargic on admission and noted to be hypotensive and noted to have a fever with temperature as high as 102, lactic acid elevated at 2.6 on admission with a leukocytosis with a white count of 21, CT chest worrisome for pneumonia, urinalysis with large leukocytes, greater than 50 WBCs, many bacteria.  Blood cultures preliminary positive for E. coli.  Urine cultures with multiple species.  Patient with recent urine cultures that were positive for E. coli during last hospitalization.  Patient noted to be hypotensive received fluid bolus with some wheezing and worsening shortness of breath.  Blood pressure improved.  WBC trending down.  Lactic acid level trending down.  C. difficile PCR negative.  Discontinued IV vancomycin and IV cefepime and IV Flagyl..  Continue IV Rocephin.  Discontinue IV Flagyl. Will likely need treatment for 7 to 10 days.  Patient seen by speech therapy noted to be high risk for aspiration and placed on appropriate diet with aspiration precautions.  Follow.  2.  Orthostatic hypotension Patient noted to have a history of orthostatic hypotension.  Patient on presentation with systolic blood pressures in the 70s.  Patient given a fluid bolus however supposedly had some wheezing and shortness of breath which has since improved.  Blood pressure improved.  Continue midodrine.  Gentle hydration.  3.  Acute metabolic encephalopathy Likely secondary to problem #1.  Patient more alert and following commands.  Patient improving clinically.  Continue empiric IV  antibiotics as in problem #1.  4.  Dysphagia Patient with dysphagia noted to be on pured diet per wife at home however per RN wife states patient has been  having some episodes of choking even with a pured diet.  Patient has been assessed by speech therapy and patient noted to be a high aspiration risk.  Patient underwent modified barium swallow.  Speech therapy recommending dysphagia 1 diet with honey thick liquids.  SLP following.  5.  Parkinson's disease Outpatient follow-up.  6.  Paroxysmal atrial fibrillation Patient in sinus rhythm.  Patient on admission noted to be tachycardic and due to dysphagia and mental status patient was started on amiodarone drip on admission.  Heart rate currently controlled.  Amiodarone drip has been discontinued as of 07/14/2019.  Heart rate controlled.  Patient a poor anticoagulation candidate due to frequent falls and history of GI bleed and intracranial bleed.  Outpatient follow-up with cardiology.  7.  Generalized weakness PT/OT.  Needs SNF on discharge.    8.  Severe protein calorie malnutrition Patient with high risk for aspiration currently on a dysphagia 1 diet with honey thick liquids.  Patient seen by dietitian and placed on Magic cup 3 times daily with meals, multivitamin.  9.  Hypernatremia Likely secondary to dehydration secondary to GI losses in the setting of oral salt tablets.  Discontinue salt tablets.  Change IV fluids to D5W at 100 cc/h.  Monitor fluid status closely.  Follow.  10.  Hypokalemia Secondary to GI losses.  Potassium packet 40 mEq p.o. x1.  Repeat labs this afternoon.  11.  Diarrhea Likely antibiotic induced.  C. difficile PCR negative.  Imodium 2 g given yesterday.  Imodium ordered as needed however not given over the past 24 hours.  Advised nurse to give a dose of Imodium now.    12.  Prognosis Patient with multiple comorbidities, advanced age of 86, recent hospitalization for UTI, dysphagia now presenting with severe sepsis secondary to E. coli bacteremia, UTI, aspiration pneumonia.  Palliative care consultated for goals of care.   DVT prophylaxis: Lovenox Code Status:  DNR Family Communication: Updated wife at bedside. Disposition Plan: SNF when medically stable.    Consultants:   Palliative care: Devin Going, RN 07/15/2019  Procedures:   CT chest 07/24/2019  Chest x-ray 07/16/2019  Modified barium swallow 07/15/2019  Antimicrobials:  IV cefepime 07/14/2019>>>> 07/15/2019  IV vancomycin 08/07/2019>>>>> 07/15/2019  IV Flagyl 07/16/2019>>>>>>> 07/16/2019  IV Rocephin 07/15/2019   Subjective: Patient sleeping however easily arousable.  Wife states had a good breakfast this morning.  Patient denies any significant shortness of breath or chest pain.  Rectal tube noted with loose stool however increased consistency from the prior day.    Objective: Vitals:   07/17/19 0118 07/17/19 0200 07/17/19 0417 07/17/19 0427  BP: 131/69 112/73 122/78   Pulse: 92 90 87   Resp:   18   Temp:   (!) 97.5 F (36.4 C)   TempSrc:   Oral   SpO2:   95%   Weight:    66.8 kg  Height:        Intake/Output Summary (Last 24 hours) at 07/17/2019 0951 Last data filed at 07/17/2019 0912 Gross per 24 hour  Intake 1837.89 ml  Output 1825 ml  Net 12.89 ml   Filed Weights   07/15/19 0459 07/16/19 0416 07/17/19 0427  Weight: 69.6 kg 67.4 kg 66.8 kg    Examination:  General exam: Frail.  Chronically ill-appearing.  Dry mucous membranes.  Respiratory system: Coarse breath sounds in the right base.  Fair air movement.  No wheezing.  Normal respiratory effort.  Cardiovascular system: Regular rate rhythm no murmurs rubs or gallops.  No JVD.  No lower extremity edema.   Gastrointestinal system: Abdomen is soft, nontender, nondistended, positive bowel sounds.  No rebound.  No guarding.  Central nervous system: Alert and oriented. No focal neurological deficits. Extremities: Symmetric 5 x 5 power. Skin: No rashes, lesions or ulcers Psychiatry: Judgement and insight appear poor to fair. Mood & affect appropriate.     Data Reviewed: I have personally reviewed following labs and imaging  studies  CBC: Recent Labs  Lab 07/29/2019 1111 07/14/19 0600 07/15/19 0504 07/16/19 0424 07/17/19 0257  WBC 21.0* 16.7* 11.8* 5.6 4.9  NEUTROABS  --   --  10.7* 4.4 3.5  HGB 10.9* 9.7* 10.4* 10.4* 10.3*  HCT 33.0* 29.9* 31.9* 31.3* 30.7*  MCV 84.6 85.4 83.3 82.8 83.0  PLT 289 197 209 184 [id number]   Basic Metabolic Panel: Recent Labs  Lab 07/10/2019 1111 08/06/2019 1400 07/14/19 0600 07/15/19 0504 07/16/19 0424 07/17/19 0257  NA 135  --  135 140 147* 149*  K 3.8  --  3.5 3.9 3.1* 3.0*  CL 101  --  103 108 115* 119*  CO2 21*  --  21* 21* 24 22  GLUCOSE 131*  --  111* 81 141* 144*  BUN 31*  --  39* 29* 21 16  CREATININE 1.46*  --  1.47* 1.24 1.13 1.03  CALCIUM 8.3*  --  7.7* 7.8* 7.8* 7.8*  MG  --  1.6* 1.8 2.2 1.9 2.0   GFR: Estimated Creatinine Clearance: 43.2 mL/min (by C-G formula based on SCr of 1.03 mg/dL). Liver Function Tests: No results for input(s): AST, ALT, ALKPHOS, BILITOT, PROT, ALBUMIN in the last 168 hours. No results for input(s): LIPASE, AMYLASE in the last 168 hours. No results for input(s): AMMONIA in the last 168 hours. Coagulation Profile: Recent Labs  Lab 07/17/2019 1400 07/14/19 0600  INR 1.2 1.3*   Cardiac Enzymes: No results for input(s): CKTOTAL, CKMB, CKMBINDEX, TROPONINI in the last 168 hours. BNP (last 3 results) No results for input(s): PROBNP in the last 8760 hours. HbA1C: No results for input(s): HGBA1C in the last 72 hours. CBG: No results for input(s): GLUCAP in the last 168 hours. Lipid Profile: No results for input(s): CHOL, HDL, LDLCALC, TRIG, CHOLHDL, LDLDIRECT in the last 72 hours. Thyroid Function Tests: No results for input(s): TSH, T4TOTAL, FREET4, T3FREE, THYROIDAB in the last 72 hours. Anemia Panel: No results for input(s): VITAMINB12, FOLATE, FERRITIN, TIBC, IRON, RETICCTPCT in the last 72 hours. Sepsis Labs: Recent Labs  Lab 08/05/2019 1344 07/10/2019 1559 07/14/19 0600 07/15/19 0504 07/16/19 0424  PROCALCITON  --   --   28.60 12.26 5.42  LATICACIDVEN 2.6* 2.4* 1.2 1.0  --     Recent Results (from the past 240 hour(s))  Blood Culture (routine x 2)     Status: Abnormal (Preliminary result)   Collection Time: 08/06/2019  1:42 PM   Specimen: BLOOD  Result Value Ref Range Status   Specimen Description BLOOD LEFT ANTECUBITAL  Final   Special Requests   Final    BOTTLES DRAWN AEROBIC AND ANAEROBIC Blood Culture adequate volume   Culture  Setup Time   Final    GRAM NEGATIVE RODS IN BOTH AEROBIC AND ANAEROBIC BOTTLES CRITICAL RESULT CALLED TO, READ BACK BY AND VERIFIED WITH: J.LEDFORD,PHARMD AT [place] ON 07/14/19 BY G.MCADOO  Culture ESCHERICHIA COLI (A)  Final   Report Status PENDING  Incomplete   Organism ID, Bacteria ESCHERICHIA COLI  Final      Susceptibility   Escherichia coli - MIC*    AMPICILLIN >=32 RESISTANT Resistant     CEFAZOLIN 16 SENSITIVE Sensitive     CEFEPIME <=0.12 SENSITIVE Sensitive     CEFTAZIDIME <=1 SENSITIVE Sensitive     CEFTRIAXONE <=0.25 SENSITIVE Sensitive     CIPROFLOXACIN 1 SENSITIVE Sensitive     GENTAMICIN >=16 RESISTANT Resistant     IMIPENEM <=0.25 SENSITIVE Sensitive     TRIMETH/SULFA >=320 RESISTANT Resistant     AMPICILLIN/SULBACTAM >=32 RESISTANT Resistant     PIP/TAZO Value in next row Sensitive      <=4 SENSITIVEPerformed at [facility] [address], [place], [place] [address]    * ESCHERICHIA COLI  Blood Culture ID Panel (Reflexed)     Status: Abnormal   Collection Time: 07/20/2019  1:42 PM  Result Value Ref Range Status   Enterococcus species NOT DETECTED NOT DETECTED Final   Listeria monocytogenes NOT DETECTED NOT DETECTED Final   Staphylococcus species NOT DETECTED NOT DETECTED Final   Staphylococcus aureus (BCID) NOT DETECTED NOT DETECTED Final   Streptococcus species NOT DETECTED NOT DETECTED Final   Streptococcus agalactiae NOT DETECTED NOT DETECTED Final   Streptococcus pneumoniae NOT DETECTED NOT DETECTED Final   Streptococcus pyogenes NOT  DETECTED NOT DETECTED Final   Acinetobacter baumannii NOT DETECTED NOT DETECTED Final   Enterobacteriaceae species DETECTED (A) NOT DETECTED Final    Comment: Enterobacteriaceae represent a large family of gram-negative bacteria, not a single organism. CRITICAL RESULT CALLED TO, READ BACK BY AND VERIFIED WITH: J.LEDFORD,PHARMD AT 0557 ON 07/14/19 BY G.MCADOO    Enterobacter cloacae complex NOT DETECTED NOT DETECTED Final   Escherichia coli DETECTED (A) NOT DETECTED Final    Comment: CRITICAL RESULT CALLED TO, READ BACK BY AND VERIFIED WITH: J.LEDFORD,PHARMD AT 0557 ON 07/14/19 BY G.MCADOO    Klebsiella oxytoca NOT DETECTED NOT DETECTED Final   Klebsiella pneumoniae NOT DETECTED NOT DETECTED Final   Proteus species NOT DETECTED NOT DETECTED Final   Serratia marcescens NOT DETECTED NOT DETECTED Final   Carbapenem resistance NOT DETECTED NOT DETECTED Final   Haemophilus influenzae NOT DETECTED NOT DETECTED Final   Neisseria meningitidis NOT DETECTED NOT DETECTED Final   Pseudomonas aeruginosa NOT DETECTED NOT DETECTED Final   Candida albicans NOT DETECTED NOT DETECTED Final   Candida glabrata NOT DETECTED NOT DETECTED Final   Candida krusei NOT DETECTED NOT DETECTED Final   Candida parapsilosis NOT DETECTED NOT DETECTED Final   Candida tropicalis NOT DETECTED NOT DETECTED Final    Comment: Performed at [facility], [place] [address] [place], [place] [address]  Blood Culture (routine x 2)     Status: Abnormal   Collection Time: 08/05/2019  2:12 PM   Specimen: BLOOD LEFT FOREARM  Result Value Ref Range Status   Specimen Description BLOOD LEFT FOREARM  Final   Special Requests   Final    BOTTLES DRAWN AEROBIC AND ANAEROBIC Blood Culture results may not be optimal due to an inadequate volume of blood received in culture bottles   Culture  Setup Time   Final    GRAM NEGATIVE RODS ANAEROBIC BOTTLE ONLY CRITICAL VALUE NOTED.  VALUE IS CONSISTENT WITH PREVIOUSLY REPORTED AND CALLED VALUE.      Culture (A)  Final    ESCHERICHIA COLI SUSCEPTIBILITIES PERFORMED ON PREVIOUS CULTURE WITHIN  THE LAST 5 DAYS. Performed at [facility], [place] [address], [place], [place] [address]    Report Status 07/16/2019 FINAL  Final  Urine culture     Status: Abnormal   Collection Time: 07/20/2019  5:23 PM   Specimen: In/Out Cath Urine  Result Value Ref Range Status   Specimen Description IN/OUT CATH URINE  Final   Special Requests   Final    NONE Performed at [facility], [place] [address], [place], [place] [address]    Culture MULTIPLE SPECIES PRESENT, SUGGEST RECOLLECTION (A)  Final   Report Status 07/15/2019 FINAL  Final  Respiratory Panel by RT PCR (Flu A&B, Covid) - Nasopharyngeal Swab     Status: None   Collection Time: 07/20/2019  6:29 PM   Specimen: Nasopharyngeal Swab  Result Value Ref Range Status   SARS Coronavirus 2 by RT PCR NEGATIVE NEGATIVE Final    Comment: (NOTE) SARS-CoV-2 target nucleic acids are NOT DETECTED. The SARS-CoV-2 RNA is generally detectable in upper respiratoy specimens during the acute phase of infection. The lowest concentration of SARS-CoV-2 viral copies this assay can detect is 131 copies/mL. A negative result does not preclude SARS-Cov-2 infection and should not be used as the sole basis for treatment or other patient management decisions. A negative result may occur with  improper specimen collection/handling, submission of specimen other than nasopharyngeal swab, presence of viral mutation(s) within the areas targeted by this assay, and inadequate number of viral copies (<131 copies/mL). A negative result must be combined with clinical observations, patient history, and epidemiological information. The expected result is Negative. Fact Sheet for Patients:  PinkCheek.be Fact Sheet for Healthcare Providers:  GravelBags.it This test is not yet ap proved or cleared by the [place] FDA  and  has been authorized for detection and/or diagnosis of SARS-CoV-2 by FDA under an Emergency Use Authorization (EUA). This EUA will remain  in effect (meaning this test can be used) for the duration of the COVID-19 declaration under Section 564(b)(1) of the Act, 21 U.S.C. section 360bbb-3(b)(1), unless the authorization is terminated or revoked sooner.    Influenza A by PCR NEGATIVE NEGATIVE Final   Influenza B by PCR NEGATIVE NEGATIVE Final    Comment: (NOTE) The Xpert Xpress SARS-CoV-2/FLU/RSV assay is intended as an aid in  the diagnosis of influenza from Nasopharyngeal swab specimens and  should not be used as a sole basis for treatment. Nasal washings and  aspirates are unacceptable for Xpert Xpress SARS-CoV-2/FLU/RSV  testing. Fact Sheet for Patients: PinkCheek.be Fact Sheet for Healthcare Providers: GravelBags.it This test is not yet approved or cleared by the [place] FDA and  has been authorized for detection and/or diagnosis of SARS-CoV-2 by  FDA under an Emergency Use Authorization (EUA). This EUA will remain  in effect (meaning this test can be used) for the duration of the  Covid-19 declaration under Section 564(b)(1) of the Act, 21  U.S.C. section 360bbb-3(b)(1), unless the authorization is  terminated or revoked. Performed at [facility], [place] [address] [place], [place] [address]   MRSA PCR Screening     Status: None   Collection Time: 07/15/19 10:30 PM   Specimen: Nasal Mucosa; Nasopharyngeal  Result Value Ref Range Status   MRSA by PCR NEGATIVE NEGATIVE Final    Comment:        The GeneXpert MRSA Assay (FDA approved for NASAL specimens only), is one component of a comprehensive MRSA colonization surveillance program. It is not  intended to diagnose MRSA infection nor to guide or monitor treatment for MRSA infections. Performed at [facility], [place] [address], [place],  [place] [address]   C difficile quick scan w PCR reflex     Status: None   Collection Time: 07/16/19  9:48 AM   Specimen: STOOL  Result Value Ref Range Status   C Diff antigen NEGATIVE NEGATIVE Final   C Diff toxin NEGATIVE NEGATIVE Final   C Diff interpretation No C. difficile detected.  Final    Comment: Performed at [facility], [place] [address], [place], [place] [address]         Radiology Studies: DG Swallowing [facility] Pathology  Result Date: 07/15/2019 Objective Swallowing Evaluation: Type of Study: MBS-Modified Barium Swallow Study  Patient Details Name: Cayce Hull MRN: [id number] Date of Birth: July 11, 1924 Today's Date: 07/15/2019 Time: SLP Start Time (ACUTE ONLY): 1015 -SLP Stop Time (ACUTE ONLY): 1100 SLP Time Calculation (min) (ACUTE ONLY): 45 min Past Medical History: Past Medical History: Diagnosis Date . Awareness alteration, transient 11/02/2014 . Cellulitis of right hand 01/27/2017 . Colon cancer ([place])  . Essential hypertension 09/23/2012 . Fall 09/16/2016 . Fall from standing 02/25/2017 . GI bleed  . Hyperlipidemia  . Hypertension  . Insomnia 08/13/2016 . Normocytic anemia 01/27/2017 . On amiodarone therapy 08/06/2015 . Orthostatic hypotension 08/06/2015 . OSA (obstructive sleep apnea)  . PAF (paroxysmal atrial fibrillation) ([place])  . Parkinson disease ([place])  . Prostate cancer ([place])  . SDH (subdural hematoma) ([place]) 09/16/2016 . Sepsis ([place]) 07/14/2019 . Status post placement of implantable loop recorder 07/11/2015 . Stroke ([place])  . 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 Subdural hematoma ([place]) 09/05/2016 . Syncope 11/02/2014 . TIA (transient ischemic attack)  . Vitamin D deficiency  Past Surgical History: Past Surgical History: Procedure Laterality Date . COLECTOMY   . HERNIA REPAIR   . ORCHIECTOMY   . PROSTATECTOMY   . TOTAL HIP ARTHROPLASTY Right 11/11/2018  Procedure: TOTAL HIP ARTHROPLASTY ANTERIOR APPROACH;   Surgeon: Gaynelle Arabian, MD;  Location: [facility]S;  Service: Orthopedics;  Laterality: Right;  100 HPI: Gedalia Blaes is a 86 y.o. male with medical history significant of left intraparenchymal hemorrhage 06/18/19, Parkinson's with autonomic dysfunction, paroxysmal atrial fibrillation, sick sinus syndrome, orthostatic hypotension, pseudogout, subdural hematoma, hypertension difficult to manage secondary to intermittent orthostatic hypotension, history of frequent falls, sleep apnea. Admitted with generalized weakness, on puree diet with frequent coughing per wife. CXR Bilateral lower lobe peribronchial thickening with small airways impaction, patchy ground-glass density, and centrilobular nodularity, suspicious for aspiration. BSE 06/18/19 Per eval pt receives Botox injections, + cough with thin, tolerated nectar, ferquent belching. No indications pna, knowledgeable wife re: strategies, regular/thin recommended.  Subjective: Pt seen in radiology for MBS. Wife in attendance,. Assessment / Plan / Recommendation CHL IP CLINICAL IMPRESSIONS 07/15/2019 Clinical Impression Pt was seen for MBS to objectively assess swallow function and safety, and to identify least restrictive diet. Wife was present during this assessment. Pt accepted trials of nectar thick and honey thick liquids, and puree textures. Pt presents with significant oral and pharyngeal dysphagia, characterized as follows: ORALLY, pt's lingual and velar movement is markedly restricted, resulting in poor bolus control, formation, and posterior propulsion, as well as premature spillage and residue on the lingual and velar surfaces. Despite flaccidity of the soft palate, Velopharyngeal closure appears adequate, as pt does not exhibit nasal regurgitation. PHARYNGEAL swallow is characterized by  trigger of the reflex at the level of the vallecular sinus across consistencies. Weakness is apparent, and worsens as the study progresses. Muscular strength is inadequate for  stripping and airway closure, resulting in post-swallow residue in the vallecula and pyriforms, which increases with additional boluses and secretions. Penetration of nectar and honey thick liquids was noted during the swallow, with immediate sensation and throat clear, however, as the study progressed, pt was less able to clear penetrate. No penetration was seen on puree consistency, however, the risk is still elevated. Thin liquids, solid textures and barium tablet were not provided during this assessment due to high risk of aspiration. At this time, puree diet and honey thick liquids via teaspoon is recommended, with meds crushed in puree. Given advanced age and Parkinson's, fatigue during meals with consequent increase in aspiration risk is highly likely. In addition, pt will have difficulty meeting his nutrition and hydration needs on a diet this restrictive. Significant improvement in function and safety is unlikely. Palliative Care consult is recommended to facilitate establishment of appropriate goals of care for this sweet gentleman and his wife, who is attentive, caring and concerned. Wife was provided with extensive education using review of MBS videos, and was given written safe swallow strategies. SLP will continue to follow to provide education.  SLP Visit Diagnosis Dysphagia, oropharyngeal phase (R13.12)     Impact on safety and function Severe aspiration risk;Risk for inadequate nutrition/hydration   CHL IP TREATMENT RECOMMENDATION 07/15/2019 Treatment Recommendations Therapy as outlined in treatment plan below   Prognosis 07/15/2019 Prognosis for Safe Diet Advancement Guarded Barriers to Reach Goals Severity of deficits, Parkinson's, advanced age   CHL IP DIET RECOMMENDATION 07/15/2019 SLP Diet Recommendations Dysphagia 1 (Puree) solids;Honey thick liquids Liquid Administration via Spoon Medication Administration Crushed with puree Compensations Minimize environmental distractions;Slow rate;Small  sips/bites;Multiple dry swallows after each bite/sip;Follow solids with liquid;Clear throat intermittently Postural Changes Remain semi-upright after after feeds/meals (Comment);Seated upright at 90 degrees   CHL IP OTHER RECOMMENDATIONS 07/15/2019 Recommended Consults Palliative on board Oral Care Recommendations Oral care QID;Staff/trained caregiver to provide oral care Other Recommendations Order thickener from pharmacy;Have oral suction available   CHL IP FOLLOW UP RECOMMENDATIONS 07/15/2019 Follow up Recommendations 24 hour supervision/assistance   CHL IP FREQUENCY AND DURATION 07/15/2019 Speech Therapy Frequency (ACUTE ONLY) min 2x/week Treatment Duration 1 week;2 weeks      CHL IP ORAL PHASE 07/15/2019 Oral Phase Impaired     Oral - Honey Teaspoon, cup Weak lingual manipulation;Incomplete tongue to palate contact;Reduced posterior propulsion;Lingual/palatal residue;Piecemeal swallowing;Delayed oral transit;Decreased bolus cohesion;Premature spillage     Oral - Nectar Teaspoon, Cup Weak lingual manipulation;Incomplete tongue to palate contact;Reduced posterior propulsion;Holding of bolus;Lingual/palatal residue;Delayed oral transit;Decreased bolus cohesion;Premature spillage         Oral - Puree Impaired mastication;Weak lingual manipulation;Incomplete tongue to palate contact;Reduced posterior propulsion;Lingual/palatal residue;Delayed oral transit;Decreased bolus cohesion;Premature spillage  CHL IP CERVICAL ESOPHAGEAL PHASE 07/15/2019 Cervical Esophageal Phase Impaired Honey Cup Reduced cricopharyngeal relaxation Nectar Cup Reduced cricopharyngeal relaxation Puree Reduced cricopharyngeal relaxation Enriqueta Shutter, MSP, CCC-SLP Speech Language Pathologist Office: [phone] Pager: [phone] Shonna Chock 07/15/2019, 12:26 PM                   Scheduled Meds: . acidophilus   Oral QHS  . chlorhexidine  15 mL Mouth Rinse BID  . cholecalciferol  1,000 Units Oral BID WC   And  . cholecalciferol  1,000 Units  Oral Q lunch  . [START ON 07/21/2019] cyanocobalamin  1,000 mcg  Intramuscular Q30 days  . ferrous sulfate  325 mg Oral TID WC  . ipratropium  1 spray Nasal BID  . loratadine  10 mg Oral QHS  . mouth rinse  15 mL Mouth Rinse q12n4p  . Melatonin  3 mg Oral QHS  . metoprolol tartrate  2.5 mg Intravenous Once  . midodrine  2.5 mg Oral Daily  . pantoprazole  40 mg Oral Daily  . polyvinyl alcohol  1 drop Both Eyes BID  . pravastatin  40 mg Oral QHS   Continuous Infusions: . sodium chloride Stopped (07/16/19 1308)  . cefTRIAXone (ROCEPHIN)  IV Stopped (07/16/19 1322)  . dextrose 100 mL/hr at 07/17/19 0932     LOS: 4 days    Time spent: 40 minutes    Irine Seal, MD [facility]  If 7PM-7AM, please contact night-coverage [email or web address] 07/17/2019, 9:51 AM

## 2019-07-17 NOTE — Progress Notes (Signed)
Patients left posterior forearm presents with increasing warmth and redness. Pt remains afebrile. HTN noted on VS, pt receiving IVF @ 127ml/hr and receiving midodrine, per wife he was rx'd for dropping BP she asks if its not necessary any longer. No further doses due today. Will report to pm RN.

## 2019-07-18 ENCOUNTER — Inpatient Hospital Stay (HOSPITAL_COMMUNITY): Payer: Medicare Other

## 2019-07-18 DIAGNOSIS — Z515 Encounter for palliative care: Secondary | ICD-10-CM

## 2019-07-18 DIAGNOSIS — E43 Unspecified severe protein-calorie malnutrition: Secondary | ICD-10-CM

## 2019-07-18 LAB — CULTURE, BLOOD (ROUTINE X 2): Special Requests: ADEQUATE

## 2019-07-21 DIAGNOSIS — Z515 Encounter for palliative care: Secondary | ICD-10-CM

## 2019-08-09 NOTE — Progress Notes (Signed)
   Vital Signs MEWS/VS Documentation      07/17/2019 1629 07/17/2019 1705 07/17/2019 1936 07/17/2019 2346   MEWS Score:  1  1  2  3    MEWS Score Color:  Green  Green  Yellow  Yellow   Resp:  18  20  (!) 21  (!) 30   Pulse:  90  93  93  96   BP:  (!) 145/84  (!) 147/90  120/82  --   Temp:  98.9 F (37.2 C)  98.6 F (37 C)  98.2 F (36.8 C)  97.7 F (36.5 C)   O2 Device:  Room Air  Room Health visitor  Room Air   Level of Consciousness:  Responds to Voice  --  --  --           Tristan Schroeder 07/20/19,12:09 AM

## 2019-08-09 NOTE — Plan of Care (Signed)
  Problem: Clinical Measurements: Goal: Respiratory complications will improve Outcome: Progressing   Problem: Clinical Measurements: Goal: Cardiovascular complication will be avoided Outcome: Progressing   

## 2019-08-09 NOTE — Progress Notes (Signed)
Patients wife called RN because she says that her husband is "sweating and breathing is off". RN came to assess patient and RR 30, O2 stas 94% on RA (CPAP was off for about 5 min), temp 97.7 orally.   RN assessed lung sounds and heard more crackles than earlier.  RN paged provider and raid response RN.  Patiens stomach is also distended.  Fluids stopped, lasix given. CXR done.

## 2019-08-09 NOTE — Progress Notes (Signed)
The chaplain visited as a result of a page for the unit nurse.  The chaplain was present with the wife as the patient died.  The chaplain was also present after the patient died to be with the patient's two sons.  The chaplain does not need to follow-up.  Brion Aliment Chaplain Resident For questions concerning this note please contact me by pager [phone]

## 2019-08-09 NOTE — Significant Event (Addendum)
Rapid Response Event Note  Overview:Called d/t SOB. Unable to see pt at time of call however, NP notified prior to calling RRT and ordered lasix and PCXR. RT sent to bedside to assess pt.  Time Called: 0000 Arrival Time: 0050 Event Type: Respiratory  Initial Focused Assessment: On assessment, pt laying in bed with eyes opened. Pt will track but would not follow commands. Secretions pouring from patient's mouth. Skin warm and slightly diaphoretic (better than earlier per pt's wife who is at the bedside). Lungs with crackles t/o all lung fields. ABD taut and distended.  Pt has flexiseal seal with green stool in bag.  T-97.7, HR-98, BP  RR-30, SpO2-94% on RA (These VS were at time of original call)  Interventions: Stop IVF Patient's mouth suctioned PCXR-Bilateral mid to lower lung field nodular and streaky densities most consistent with infiltrate. Overall interval worsening of the airspace disease since the prior radiograph.  60mg  lasix IV x 1 KUB-Large amount of colonic stool with diffuse colonic distension. Plan of Care (if not transferred): Pt is DNR.  Bipap not an option d/t inability to control secretions and h/o aspiration PNA. Lasix given PTA RRT-monitor response. Pt VS rechecked on my arrival and SpO2-75% on RA and SBP-80s. Pt placed on NRB and NP coming to bedside to assess if any further interventions necessary.  0130-Pt with agonal respirations, palpable pulse in the 30s. NP at bedside at this time and wife updated as patient appears to be actively dying at this point. Support given to wife and chaplain paged and is on way.  Event Summary: Name of Physician Notified: Bodenheimer, NP at [facility] prior to calling RRT)    at          [place], Carren Rang

## 2019-08-09 NOTE — Death Summary Note (Signed)
DEATH SUMMARY   Patient Details  Name: Joe Glover MRN: [id number] DOB: 1925/01/09  Admission/Discharge Information   Admit Date:  07/17/2019  Date of Death: Date of Death: 2019/07/22  Time of Death: Time of Death: 0210  Length of Stay: 5  Referring Physician: [name], MD   Reason(s) for Hospitalization  Severe sepsis with acute organ dysfunction, secondary to E. coli bacteremia and aspiration pneumonia.  Diagnoses  Preliminary cause of death:  Secondary Diagnoses (including complications and co-morbidities):  Principal Problem:   Severe sepsis with acute organ dysfunction ([place]) Active Problems:   E coli bacteremia   Aspiration pneumonia (HCC)   Acute metabolic encephalopathy   OSA (obstructive sleep apnea)   Parkinson disease (HCC)   Hypokalemia   Essential hypertension   Orthostatic hypotension   PAF (paroxysmal atrial fibrillation) (HCC)   Sick sinus syndrome (HCC)   Sepsis ([place])   Acute lower UTI   Protein-calorie malnutrition, severe   Diarrhea   Hypernatremia   Brief Hospital Course (including significant findings, care, treatment, and services provided and events leading to death)  Joe Glover is a 86 y.o. year old male who has past medical history of paroxysmal atrial fibrillation, sick sinus syndrome, orthostatic hypotension and recurrent syncope, Parkinson's disease with autonomic dysfunction, dysphagia who presented to the ED with severe sepsis and acute organ dysfunction and noted to have a E. coli bacteremia and aspiration pneumonia.  Patient admitted placed empirically on IV antibiotics, speech therapy consulted patient underwent modified barium swallow and patient was placed on appropriate diet with aspiration precautions.  1 severe sepsis with acute organ dysfunction likely secondary to E. Coli bacteremia/UTI/aspiration pneumonia Patient had presented to the ED due to increasing weakness, noted to be lethargic on admission and noted to be  hypotensive and noted to have a fever with temperature as high as 102, lactic acid elevated at 2.6 on admission with a leukocytosis with a white count of 21, CT chest worrisome for pneumonia, urinalysis with large leukocytes, greater than 50 WBCs, many bacteria.  Blood cultures positive for E. coli.  Urine cultures with multiple species.  Patient with recent urine cultures that were positive for E. coli during last hospitalization.  Patient noted to be hypotensive received fluid bolus with some wheezing and worsening shortness of breath initially on admission.  Blood pressure improved.  Patient was placed empirically on IV antibiotics of vancomycin, Flagyl, cefepime. WBC trended down.  Lactic acid level trended down.  C. difficile PCR negative.  Discontinued IV vancomycin and IV cefepime and IV Flagyl.  IV antibiotic subsequently narrowed to IV Rocephin.  Patient was seen by speech therapy noted to be high risk for aspiration and placed on appropriate diet with aspiration precautions.  Patient was initially slowly improving during the hospitalization however on the evening of 07/17/2019 in the early hours of 07-22-2019 patient was noted to decompensate,  become short of breath, tachypneic with respiratory rates in the 30s with sats of 94% on room air.  Patient noted to have increased crackles with abdominal distention.  IV fluids were discontinued chest x-ray and abdominal films obtained.  Patient continued to deteriorate was DNR, noted to have agonal breaths with bradycardia with heart rates dropping into the 30s.  Patient was subsequently pronounced dead at 0210 hours on 22-Jul-2019. May his soul rest in peace.  2.  Orthostatic hypotension Patient noted to have a history of orthostatic hypotension.  Patient on presentation with systolic blood pressures in the 70s.  Patient given a  fluid bolus however supposedly had some wheezing and shortness of breath which subsequently improved.  Patient was placed back on home  regimen of midodrine and blood pressure remained stable for most of the hospitalization.   3.  Acute metabolic encephalopathy Likely secondary to problem #1.  Patient after being started on gentle hydration and IV antibiotics for his E. coli bacteremia was improving clinically during the hospitalization was more alert and following commands.  See #1.    4.  Dysphagia Patient with dysphagia noted to be on pured diet per wife at home however per RN, wife stated patient had been having some episodes of choking even with a pured diet.  Patient was assessed by speech therapy and patient noted to be a high aspiration risk.  Patient underwent modified barium swallow.  Speech therapy recommended dysphagia 1 diet with honey thick liquids, which patient was placed on and per wife was tolerating early on in the hospitalization.  5.  Parkinson's disease See #4.  6.  Paroxysmal atrial fibrillation Patient in sinus rhythm.  Patient on admission noted to be tachycardic and due to dysphagia and mental status patient was started on amiodarone drip on admission.  Heart rate improved and amiodarone drip was subsequently discontinued on 07/14/2019.  Patient noted to be a poor anticoagulation candidate due to history of frequent falls and history of GI bleed and intracranial bleed.  Patient decompensated during the hospitalization, patient had agonal breaths, patient went into bradycardia and subsequently continued to decompensate and pronounced dead on Aug 03, 2019 and 0210 hours.  May his soul rest in peace.  7.  Generalized weakness Patient was seen by PT OT and skilled nursing facility was recommended.  Patient however decompensated during the hospitalization.  8.  Severe protein calorie malnutrition Patient with high risk for aspiration per speech therapy, patient underwent MBS and subsequently placed on a dysphagia 1 diet with honey thick liquids.  Patient seen by dietitian and placed on Magic cup 3 times  daily with meals, multivitamin.  9.  Hypernatremia Likely secondary to dehydration secondary to GI losses in the setting of oral salt tablets.  Discontinued salt tablets.  IV fluids were changed to D5W and patient hydrated gently with close monitoring of fluid status.  10.  Hypokalemia Secondary to GI losses. Potassium was repleted during the hospitalization.   11.  Diarrhea Likely antibiotic induced.  C. difficile PCR negative.    Patient was placed on Imodium as needed.   12.  Prognosis Patient with multiple comorbidities, advanced age of 86, recent hospitalization for UTI, dysphagia presented with severe sepsis secondary to E. coli bacteremia, UTI, aspiration pneumonia.  Palliative care consultated for goals of care.  Patient placed on IV antibiotics.  Patient was seen by palliative care.  Patient noted on the night of 07/17/2019 to the early hours of Aug 03, 2019 to become short of breath, tachypneic with respiratory rates in the 30s with sats of 94% on room air.  Patient noted to have increased crackles with abdominal distention.  Gentle hydration was started due to hyponatremia.  IV fluids were discontinued chest x-ray and abdominal films obtained.  Patient continued to deteriorate was DNR, noted to have agonal breaths with bradycardia with heart rates dropping into the 30s.  Patient was subsequently pronounced dead at 0210 hours on 08/03/2019. May his soul rest in peace.      Pertinent Labs and Studies  Significant Diagnostic Studies DG Chest 1 View  Result Date: 08/08/2019 CLINICAL DATA:  Decreased oxygen saturation. EXAM:  CHEST  1 VIEW COMPARISON:  July 13, 2019 FINDINGS: Mildly increased lung markings are seen with very mild, stable left basilar atelectasis. There is no evidence of a pleural effusion or pneumothorax. The cardiac silhouette is borderline in size. There is moderate severity calcification thoracic aorta. The visualized skeletal structures are unremarkable.  IMPRESSION: 1. Very mild, stable left basilar atelectasis. Electronically Signed   By: Virgina Norfolk M.D.   On: 07/19/2019 18:16   CT Head Wo Contrast  Result Date: 06/25/2019 CLINICAL DATA:  Near syncopal episode today. Recent hospitalization for fall with intracranial hemorrhage. EXAM: CT HEAD WITHOUT CONTRAST TECHNIQUE: Contiguous axial images were obtained from the base of the skull through the vertex without intravenous contrast. COMPARISON:  CT head 06/17/2019 and 06/18/2019. FINDINGS: Brain: The previously demonstrated subdural hematoma along the posterior aspect of the interhemispheric falx and tentorium on the right has not significantly changed. However, there is a new component layering over the right frontal and parietal convexities, best seen on the coronal images and measuring up to 8 mm in thickness (image 44/5). Both small foci of acute intraparenchymal hemorrhage in the left frontal lobe have improved compared with the prior study. No other new areas of acute intracranial hemorrhage are seen. There is no midline shift, hydrocephalus or evidence of acute stroke. Extensive chronic small vessel ischemic changes are present in the periventricular white matter. 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 No acute vascular findings. Skull: No acute or focal findings. Sinuses/Orbits: The visualized paranasal sinuses, mastoid air cells and middle ears are clear. No acute orbital findings. Previous right lens surgery. Other: None. IMPRESSION: 1. The previously demonstrated subdural hematoma along the posterior aspect of the interhemispheric falx and tentorium on the right has not significantly changed. However, there is a new component layering over the right frontoparietal convexity, measuring up to 8 mm in thickness. 2. The small foci of acute intraparenchymal hemorrhage in the left frontal lobe have improved. 3. No other new areas of acute intracranial hemorrhage.  4. 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 5. Extensive chronic small vessel ischemic changes in the periventricular white matter. 6. These results were called by telephone at the time of interpretation on 06/25/2019 at 3:16 pm to provider [facility] , who verbally acknowledged these results. Electronically Signed   By: Richardean Sale M.D.   On: 06/25/2019 15:16   CT CHEST WO CONTRAST  Result Date: 07/24/2019 CLINICAL DATA:  Hypotension and wheezing. EXAM: CT CHEST WITHOUT CONTRAST TECHNIQUE: Multidetector CT imaging of the chest was performed following the standard protocol without IV contrast. COMPARISON:  Chest x-ray from same day. CTA chest dated January 22, 2017. FINDINGS: Cardiovascular: Normal heart size. No pericardial effusion. Unchanged mild aneurysmal dilatation of the ascending thoracic aorta measuring up to 4.0 cm. Coronary, aortic arch, and branch vessel atherosclerotic vascular disease. Mediastinum/Nodes: No enlarged mediastinal or axillary lymph nodes. Thyroid gland, trachea, and esophagus demonstrate no significant findings. Lungs/Pleura: Bilateral lower lobe peribronchial thickening with small airways impaction and patchy ground-glass densities and subsegmental atelectasis in both lower lobes. Areas of centrilobular nodularity in the left lower lobe. Upper lobe predominant paraseptal and centrilobular emphysema. Unchanged 5 mm nodule in the right upper lobe (series 5, image 40), stable since 2018, benign. No pleural effusion or pneumothorax. Upper Abdomen: No acute abnormality. Musculoskeletal: No chest wall mass or suspicious bone lesions identified. IMPRESSION: 1. Bilateral lower lobe peribronchial thickening with small airways impaction, patchy ground-glass density, and  centrilobular nodularity, suspicious for aspiration. 2. Unchanged ascending thoracic aortic aneurysm measuring 4.0 cm. 3.  Emphysema (ICD10-J43.9). 4.  Aortic atherosclerosis (ICD10-I70.0).  Electronically Signed   By: Titus Dubin M.D.   On: 07/25/2019 21:31   DG CHEST PORT 1 VIEW  Result Date: July 21, 2019 CLINICAL DATA:  86 year old male with shortness of breath EXAM: PORTABLE CHEST 1 VIEW COMPARISON:  Chest radiograph dated 07/09/2019 and CT dated 08/08/2019. FINDINGS: Bilateral mid to lower lung field nodular and streaky hazy densities most consistent with infiltrate. There is background of emphysema. No focal consolidation, pleural effusion, or pneumothorax. Stable cardiomediastinal silhouette. A loop recorder device noted. Atherosclerotic calcification of the aorta. No acute osseous pathology. IMPRESSION: Bilateral mid to lower lung field nodular and streaky densities most consistent with infiltrate. Overall interval worsening of the airspace disease since the prior radiograph. Continued follow-up recommended. Electronically Signed   By: Anner Crete M.D.   On: 21-Jul-2019 00:30   DG Chest Port 1 View  Result Date: 08/04/2019 CLINICAL DATA:  Cough. EXAM: PORTABLE CHEST 1 VIEW COMPARISON:  06/28/2019. FINDINGS: Mediastinum hilar structures normal. Cardiac monitoring device again noted. Stable cardiomegaly. Mild left base atelectasis/infiltrate. No pleural effusion or pneumothorax. Degenerative change thoracic spine. IMPRESSION: 1.  Stable cardiomegaly. 2.  Mild left base atelectasis/infiltrate. Electronically Signed   By: Marcello Moores  Register   On: 07/15/2019 14:20   DG CHEST PORT 1 VIEW  Result Date: 06/28/2019 CLINICAL DATA:  Fever. EXAM: PORTABLE CHEST 1 VIEW COMPARISON:  Chest x-rays dated 06/25/2019 and 11/11/2018 FINDINGS: Heart size and pulmonary vascularity are normal. Aortic atherosclerosis. Lungs are clear. Loop recorder in place. No significant bone abnormality. IMPRESSION: 1. No acute abnormalities. 2.  Aortic Atherosclerosis (ICD10-I70.0). Electronically Signed   By: Lorriane Shire M.D.   On: 06/28/2019 11:48   DG Chest Port 1 View  Result Date: 06/25/2019 CLINICAL  DATA:  Syncope EXAM: PORTABLE CHEST 1 VIEW COMPARISON:  11/09/2018 FINDINGS: Heart is normal size. Tortuous, calcified aorta. No confluent airspace opacities or effusions. No acute bony abnormality. Loop recorder device in place. IMPRESSION: No acute cardiopulmonary disease Electronically Signed   By: Rolm Baptise M.D.   On: 06/25/2019 20:56   DG Abd Portable 1V  Result Date: 21-Jul-2019 CLINICAL DATA:  Abdominal distension EXAM: PORTABLE ABDOMEN - 1 VIEW COMPARISON:  None. FINDINGS: Large amount of stool within the colon with diffuse distension. No free air is visible. The degree of colonic distension makes it difficult to differentiate from small bowel. IMPRESSION: Large amount of colonic stool with diffuse colonic distension. Electronically Signed   By: Ulyses Jarred M.D.   On: 21-Jul-2019 01:45   DG Swallowing Func-Speech Pathology  Result Date: 07/15/2019 Objective Swallowing Evaluation: Type of Study: MBS-Modified Barium Swallow Study  Patient Details Name: Joe Glover MRN: [id number] Date of Birth: 24-Nov-1924 Today's Date: 07/15/2019 Time: SLP Start Time (ACUTE ONLY): 1015 -SLP Stop Time (ACUTE ONLY): 1100 SLP Time Calculation (min) (ACUTE ONLY): 45 min Past Medical History: Past Medical History: Diagnosis Date . Awareness alteration, transient 11/02/2014 . Cellulitis of right hand 01/27/2017 . Colon cancer ([place])  . Essential hypertension 09/23/2012 . Fall 09/16/2016 . Fall from standing 02/25/2017 . GI bleed  . Hyperlipidemia  . Hypertension  . Insomnia 08/13/2016 . Normocytic anemia 01/27/2017 . On amiodarone therapy 08/06/2015 . Orthostatic hypotension 08/06/2015 . OSA (obstructive sleep apnea)  . PAF (paroxysmal atrial fibrillation) ([place])  . Parkinson disease ([place])  . Prostate cancer ([place])  . SDH (subdural hematoma) ([place]) 09/16/2016 . Sepsis ([place]) 07/14/2019 .  Status post placement of implantable loop recorder 07/11/2015 . Stroke ([place])  . 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 Subdural hematoma ([place]) 09/05/2016 . Syncope 11/02/2014 . TIA (transient ischemic attack)  . Vitamin D deficiency  Past Surgical History: Past Surgical History: Procedure Laterality Date . COLECTOMY   . HERNIA REPAIR   . ORCHIECTOMY   . PROSTATECTOMY   . TOTAL HIP ARTHROPLASTY Right 11/11/2018  Procedure: TOTAL HIP ARTHROPLASTY ANTERIOR APPROACH;  Surgeon: Gaynelle Arabian, MD;  Location: [facility];  Service: Orthopedics;  Laterality: Right;  100 HPI: Joe Glover is a 86 y.o. male with medical history significant of left intraparenchymal hemorrhage 06/18/19, Parkinson's with autonomic dysfunction, paroxysmal atrial fibrillation, sick sinus syndrome, orthostatic hypotension, pseudogout, subdural hematoma, hypertension difficult to manage secondary to intermittent orthostatic hypotension, history of frequent falls, sleep apnea. Admitted with generalized weakness, on puree diet with frequent coughing per wife. CXR Bilateral lower lobe peribronchial thickening with small airways impaction, patchy ground-glass density, and centrilobular nodularity, suspicious for aspiration. BSE 06/18/19 Per eval pt receives Botox injections, + cough with thin, tolerated nectar, ferquent belching. No indications pna, knowledgeable wife re: strategies, regular/thin recommended.  Subjective: Pt seen in radiology for MBS. Wife in attendance,. Assessment / Plan / Recommendation CHL IP CLINICAL IMPRESSIONS 07/15/2019 Clinical Impression Pt was seen for MBS to objectively assess swallow function and safety, and to identify least restrictive diet. Wife was present during this assessment. Pt accepted trials of nectar thick and honey thick liquids, and puree textures. Pt presents with significant oral and pharyngeal dysphagia, characterized as follows: ORALLY, pt's lingual and velar movement is markedly restricted, resulting in poor bolus control, formation, and posterior propulsion, as well as  premature spillage and residue on the lingual and velar surfaces. Despite flaccidity of the soft palate, Velopharyngeal closure appears adequate, as pt does not exhibit nasal regurgitation. PHARYNGEAL swallow is characterized by trigger of the reflex at the level of the vallecular sinus across consistencies. Weakness is apparent, and worsens as the study progresses. Muscular strength is inadequate for stripping and airway closure, resulting in post-swallow residue in the vallecula and pyriforms, which increases with additional boluses and secretions. Penetration of nectar and honey thick liquids was noted during the swallow, with immediate sensation and throat clear, however, as the study progressed, pt was less able to clear penetrate. No penetration was seen on puree consistency, however, the risk is still elevated. Thin liquids, solid textures and barium tablet were not provided during this assessment due to high risk of aspiration. At this time, puree diet and honey thick liquids via teaspoon is recommended, with meds crushed in puree. Given advanced age and Parkinson's, fatigue during meals with consequent increase in aspiration risk is highly likely. In addition, pt will have difficulty meeting his nutrition and hydration needs on a diet this restrictive. Significant improvement in function and safety is unlikely. Palliative Care consult is recommended to facilitate establishment of appropriate goals of care for this sweet gentleman and his wife, who is attentive, caring and concerned. Wife was provided with extensive education using review of MBS videos, and was given written safe swallow strategies. SLP will continue to follow to provide education.  SLP Visit Diagnosis Dysphagia, oropharyngeal phase (R13.12)     Impact on safety and function Severe aspiration risk;Risk for inadequate nutrition/hydration   CHL IP TREATMENT RECOMMENDATION 07/15/2019 Treatment Recommendations Therapy as outlined in treatment plan  below   Prognosis  07/15/2019 Prognosis for Safe Diet Advancement Guarded Barriers to Reach Goals Severity of deficits, Parkinson's, advanced age   CHL IP DIET RECOMMENDATION 07/15/2019 SLP Diet Recommendations Dysphagia 1 (Puree) solids;Honey thick liquids Liquid Administration via Spoon Medication Administration Crushed with puree Compensations Minimize environmental distractions;Slow rate;Small sips/bites;Multiple dry swallows after each bite/sip;Follow solids with liquid;Clear throat intermittently Postural Changes Remain semi-upright after after feeds/meals (Comment);Seated upright at 90 degrees   CHL IP OTHER RECOMMENDATIONS 07/15/2019 Recommended Consults Palliative on board Oral Care Recommendations Oral care QID;Staff/trained caregiver to provide oral care Other Recommendations Order thickener from pharmacy;Have oral suction available   CHL IP FOLLOW UP RECOMMENDATIONS 07/15/2019 Follow up Recommendations 24 hour supervision/assistance   CHL IP FREQUENCY AND DURATION 07/15/2019 Speech Therapy Frequency (ACUTE ONLY) min 2x/week Treatment Duration 1 week;2 weeks      CHL IP ORAL PHASE 07/15/2019 Oral Phase Impaired     Oral - Honey Teaspoon, cup Weak lingual manipulation;Incomplete tongue to palate contact;Reduced posterior propulsion;Lingual/palatal residue;Piecemeal swallowing;Delayed oral transit;Decreased bolus cohesion;Premature spillage     Oral - Nectar Teaspoon, Cup Weak lingual manipulation;Incomplete tongue to palate contact;Reduced posterior propulsion;Holding of bolus;Lingual/palatal residue;Delayed oral transit;Decreased bolus cohesion;Premature spillage         Oral - Puree Impaired mastication;Weak lingual manipulation;Incomplete tongue to palate contact;Reduced posterior propulsion;Lingual/palatal residue;Delayed oral transit;Decreased bolus cohesion;Premature spillage  CHL IP CERVICAL ESOPHAGEAL PHASE 07/15/2019 Cervical Esophageal Phase Impaired Honey Cup Reduced cricopharyngeal relaxation Nectar Cup  Reduced cricopharyngeal relaxation Puree Reduced cricopharyngeal relaxation Joe Glover, MSP, CCC-SLP Speech Language Pathologist Office: [phone] Pager: [phone] Shonna Chock 07/15/2019, 12:26 PM              ECHOCARDIOGRAM COMPLETE  Result Date: 06/18/2019   ECHOCARDIOGRAM REPORT   Patient Name:   Joe Glover Date of Exam: 06/18/2019 Medical Rec #:  [id number]       Height:       72.0 in Accession #:    [id number]      Weight:       175.3 lb Date of Birth:  January 23, 1925       BSA:          2.01 m Patient Age:    71 years        BP:           125/93 mmHg Patient Gender: M               HR:           74 bpm. Exam Location:  Inpatient Procedure: 2D Echo, Color Doppler and Cardiac Doppler Indications:    R55 Syncope  History:        Patient has prior history of Echocardiogram examinations, most                 recent 01/27/2017. Arrythmias:Atrial Fibrillation; Risk                 Factors:Sleep Apnea, Hypertension and Dyslipidemia. Parkinson's                 Disease.  Sonographer:    Raquel Sarna Senior RDCS Referring Phys: [place]  Sonographer Comments: Technically difficult study due to poor echo windows. IMPRESSIONS  1. Left ventricular ejection fraction, by visual estimation, is 60 to 65%. The left ventricle has normal function. There is no left ventricular hypertrophy.  2. The left ventricle has no regional wall motion abnormalities.  3. Global right ventricle has normal systolic function.The right ventricular size is normal. No increase in right ventricular  wall thickness.  4. Left atrial size was normal.  5. Right atrial size was mildly dilated.  6. The mitral valve is normal in structure. Trivial mitral valve regurgitation.  7. The tricuspid valve is normal in structure. Tricuspid valve regurgitation is trivial.  8. The aortic valve is grossly normal. Aortic valve regurgitation is mild.  9. The pulmonic valve was grossly normal. Pulmonic valve regurgitation is not visualized. 10. The  atrial septum is grossly normal. FINDINGS  Left Ventricle: Left ventricular ejection fraction, by visual estimation, is 60 to 65%. The left ventricle has normal function. The left ventricle has no regional wall motion abnormalities. There is no left ventricular hypertrophy. Right Ventricle: The right ventricular size is normal. No increase in right ventricular wall thickness. Global RV systolic function is has normal systolic function. Left Atrium: Left atrial size was normal in size. Right Atrium: Right atrial size was mildly dilated Pericardium: There is no evidence of pericardial effusion. Mitral Valve: The mitral valve is normal in structure. Trivial mitral valve regurgitation. Tricuspid Valve: The tricuspid valve is normal in structure. Tricuspid valve regurgitation is trivial. Aortic Valve: The aortic valve is grossly normal. Aortic valve regurgitation is mild. Pulmonic Valve: The pulmonic valve was grossly normal. Pulmonic valve regurgitation is not visualized. Pulmonic regurgitation is not visualized. Aorta: The aortic root and ascending aorta are structurally normal, with no evidence of dilitation. IAS/Shunts: The atrial septum is grossly normal.  LEFT VENTRICLE PLAX 2D LVIDd:         4.69 cm LVIDs:         3.69 cm LV PW:         0.98 cm LV IVS:        1.09 cm LVOT diam:     2.30 cm LV SV:         44 ml LV SV Index:   21.91 LVOT Area:     4.15 cm  RIGHT VENTRICLE RV S prime:     11.10 cm/s TAPSE (M-mode): 2.5 cm LEFT ATRIUM           Index       RIGHT ATRIUM           Index LA diam:      2.20 cm 1.09 cm/m  RA Area:     22.50 cm LA Vol (A2C): 21.7 ml 10.77 ml/m RA Volume:   65.80 ml  32.67 ml/m LA Vol (A4C): 32.3 ml 16.04 ml/m  AORTIC VALVE LVOT Vmax:   68.70 cm/s LVOT Vmean:  47.800 cm/s LVOT VTI:    0.112 m  AORTA Ao Root diam: 3.60 cm  SHUNTS Systemic VTI:  0.11 m Systemic Diam: 2.30 cm  Mertie Moores MD Electronically signed by Mertie Moores MD Signature Date/Time: 06/18/2019/3:30:05 PM    Final      Microbiology Recent Results (from the past 240 hour(s))  Blood Culture (routine x 2)     Status: Abnormal   Collection Time: 07/26/2019  1:42 PM   Specimen: BLOOD  Result Value Ref Range Status   Specimen Description BLOOD LEFT ANTECUBITAL  Final   Special Requests   Final    BOTTLES DRAWN AEROBIC AND ANAEROBIC Blood Culture adequate volume   Culture  Setup Time   Final    GRAM NEGATIVE RODS IN BOTH AEROBIC AND ANAEROBIC BOTTLES CRITICAL RESULT CALLED TO, READ BACK BY AND VERIFIED WITH: J.LEDFORD,PHARMD AT [id number] ON 07/14/19 BY G.MCADOO Performed at [facility], [address] [place], [place] [address]  Culture ESCHERICHIA COLI (A)  Final   Report Status 2019-08-03 FINAL  Final   Organism ID, Bacteria ESCHERICHIA COLI  Final      Susceptibility   Escherichia coli - MIC*    AMPICILLIN >=32 RESISTANT Resistant     CEFAZOLIN 16 SENSITIVE Sensitive     CEFEPIME <=0.12 SENSITIVE Sensitive     CEFTAZIDIME <=1 SENSITIVE Sensitive     CEFTRIAXONE <=0.25 SENSITIVE Sensitive     CIPROFLOXACIN 1 SENSITIVE Sensitive     GENTAMICIN >=16 RESISTANT Resistant     IMIPENEM <=0.25 SENSITIVE Sensitive     TRIMETH/SULFA >=320 RESISTANT Resistant     AMPICILLIN/SULBACTAM >=32 RESISTANT Resistant     PIP/TAZO <=4 SENSITIVE Sensitive     * ESCHERICHIA COLI  Blood Culture ID Panel (Reflexed)     Status: Abnormal   Collection Time: 07/27/2019  1:42 PM  Result Value Ref Range Status   Enterococcus species NOT DETECTED NOT DETECTED Final   Listeria monocytogenes NOT DETECTED NOT DETECTED Final   Staphylococcus species NOT DETECTED NOT DETECTED Final   Staphylococcus aureus (BCID) NOT DETECTED NOT DETECTED Final   Streptococcus species NOT DETECTED NOT DETECTED Final   Streptococcus agalactiae NOT DETECTED NOT DETECTED Final   Streptococcus pneumoniae NOT DETECTED NOT DETECTED Final   Streptococcus pyogenes NOT DETECTED NOT DETECTED Final   Acinetobacter baumannii NOT DETECTED NOT DETECTED  Final   Enterobacteriaceae species DETECTED (A) NOT DETECTED Final    Comment: Enterobacteriaceae represent a large family of gram-negative bacteria, not a single organism. CRITICAL RESULT CALLED TO, READ BACK BY AND VERIFIED WITH: J.LEDFORD,PHARMD AT 0557 ON 07/14/19 BY G.MCADOO    Enterobacter cloacae complex NOT DETECTED NOT DETECTED Final   Escherichia coli DETECTED (A) NOT DETECTED Final    Comment: CRITICAL RESULT CALLED TO, READ BACK BY AND VERIFIED WITH: J.LEDFORD,PHARMD AT 0557 ON 07/14/19 BY G.MCADOO    Klebsiella oxytoca NOT DETECTED NOT DETECTED Final   Klebsiella pneumoniae NOT DETECTED NOT DETECTED Final   Proteus species NOT DETECTED NOT DETECTED Final   Serratia marcescens NOT DETECTED NOT DETECTED Final   Carbapenem resistance NOT DETECTED NOT DETECTED Final   Haemophilus influenzae NOT DETECTED NOT DETECTED Final   Neisseria meningitidis NOT DETECTED NOT DETECTED Final   Pseudomonas aeruginosa NOT DETECTED NOT DETECTED Final   Candida albicans NOT DETECTED NOT DETECTED Final   Candida glabrata NOT DETECTED NOT DETECTED Final   Candida krusei NOT DETECTED NOT DETECTED Final   Candida parapsilosis NOT DETECTED NOT DETECTED Final   Candida tropicalis NOT DETECTED NOT DETECTED Final    Comment: Performed at [facility], [place] [address] [place], [place] [address]  Blood Culture (routine x 2)     Status: Abnormal   Collection Time: 07/12/2019  2:12 PM   Specimen: BLOOD LEFT FOREARM  Result Value Ref Range Status   Specimen Description BLOOD LEFT FOREARM  Final   Special Requests   Final    BOTTLES DRAWN AEROBIC AND ANAEROBIC Blood Culture results may not be optimal due to an inadequate volume of blood received in culture bottles   Culture  Setup Time   Final    GRAM NEGATIVE RODS ANAEROBIC BOTTLE ONLY CRITICAL VALUE NOTED.  VALUE IS CONSISTENT WITH PREVIOUSLY REPORTED AND CALLED VALUE.    Culture (A)  Final    ESCHERICHIA COLI SUSCEPTIBILITIES PERFORMED ON PREVIOUS  CULTURE WITHIN THE LAST 5 DAYS. Performed at [facility], [place] [address] [place], [place] [address]  Report Status 07/16/2019 FINAL  Final  Urine culture     Status: Abnormal   Collection Time: 07/30/2019  5:23 PM   Specimen: In/Out Cath Urine  Result Value Ref Range Status   Specimen Description IN/OUT CATH URINE  Final   Special Requests   Final    NONE Performed at [facility], [place] [address], [place], [place] [address]    Culture MULTIPLE SPECIES PRESENT, SUGGEST RECOLLECTION (A)  Final   Report Status 07/15/2019 FINAL  Final  Respiratory Panel by RT PCR (Flu A&B, Covid) - Nasopharyngeal Swab     Status: None   Collection Time: 08/06/2019  6:29 PM   Specimen: Nasopharyngeal Swab  Result Value Ref Range Status   SARS Coronavirus 2 by RT PCR NEGATIVE NEGATIVE Final    Comment: (NOTE) SARS-CoV-2 target nucleic acids are NOT DETECTED. The SARS-CoV-2 RNA is generally detectable in upper respiratoy specimens during the acute phase of infection. The lowest concentration of SARS-CoV-2 viral copies this assay can detect is 131 copies/mL. A negative result does not preclude SARS-Cov-2 infection and should not be used as the sole basis for treatment or other patient management decisions. A negative result may occur with  improper specimen collection/handling, submission of specimen other than nasopharyngeal swab, presence of viral mutation(s) within the areas targeted by this assay, and inadequate number of viral copies (<131 copies/mL). A negative result must be combined with clinical observations, patient history, and epidemiological information. The expected result is Negative. Fact Sheet for Patients:  PinkCheek.be Fact Sheet for Healthcare Providers:  GravelBags.it This test is not yet ap proved or cleared by the [place] FDA and  has been authorized for detection and/or diagnosis of SARS-CoV-2 by FDA  under an Emergency Use Authorization (EUA). This EUA will remain  in effect (meaning this test can be used) for the duration of the COVID-19 declaration under Section 564(b)(1) of the Act, 21 U.S.C. section 360bbb-3(b)(1), unless the authorization is terminated or revoked sooner.    Influenza A by PCR NEGATIVE NEGATIVE Final   Influenza B by PCR NEGATIVE NEGATIVE Final    Comment: (NOTE) The Xpert Xpress SARS-CoV-2/FLU/RSV assay is intended as an aid in  the diagnosis of influenza from Nasopharyngeal swab specimens and  should not be used as a sole basis for treatment. Nasal washings and  aspirates are unacceptable for Xpert Xpress SARS-CoV-2/FLU/RSV  testing. Fact Sheet for Patients: PinkCheek.be Fact Sheet for Healthcare Providers: GravelBags.it This test is not yet approved or cleared by the [place] FDA and  has been authorized for detection and/or diagnosis of SARS-CoV-2 by  FDA under an Emergency Use Authorization (EUA). This EUA will remain  in effect (meaning this test can be used) for the duration of the  Covid-19 declaration under Section 564(b)(1) of the Act, 21  U.S.C. section 360bbb-3(b)(1), unless the authorization is  terminated or revoked. Performed at [facility], [place] [address], [place],  [address]   MRSA PCR Screening     Status: None   Collection Time: 07/15/19 10:30 PM   Specimen: Nasal Mucosa; Nasopharyngeal  Result Value Ref Range Status   MRSA by PCR NEGATIVE NEGATIVE Final    Comment:        The GeneXpert MRSA Assay (FDA approved for NASAL specimens only), is one component of a comprehensive MRSA colonization surveillance program. It is not intended to diagnose MRSA infection nor to guide or monitor treatment for MRSA infections. Performed at [facility],  [address], [place], [place] [address]   C difficile quick scan w PCR reflex     Status: None   Collection  Time: 07/16/19  9:48 AM   Specimen: STOOL  Result Value Ref Range Status   C Diff antigen NEGATIVE NEGATIVE Final   C Diff toxin NEGATIVE NEGATIVE Final   C Diff interpretation No C. difficile detected.  Final    Comment: Performed at [facility], [place] [address], [place], [place] [address]    Lab Basic Metabolic Panel: Recent Labs  Lab 07/22/2019 1400 07/14/19 0600 07/15/19 0504 07/16/19 0424 07/17/19 0257 07/17/19 1358  NA  --  135 140 147* 149* 150*  K  --  3.5 3.9 3.1* 3.0* 3.3*  CL  --  103 108 115* 119* 121*  CO2  --  21* 21* 24 22 18*  GLUCOSE  --  111* 81 141* 144* 156*  BUN  --  39* 29* 21 16 17   CREATININE  --  1.47* 1.24 1.13 1.03 1.02  CALCIUM  --  7.7* 7.8* 7.8* 7.8* 7.8*  MG 1.6* 1.8 2.2 1.9 2.0  --    Liver Function Tests: No results for input(s): AST, ALT, ALKPHOS, BILITOT, PROT, ALBUMIN in the last 168 hours. No results for input(s): LIPASE, AMYLASE in the last 168 hours. No results for input(s): AMMONIA in the last 168 hours. CBC: Recent Labs  Lab 08/01/2019 1111 07/14/19 0600 07/15/19 0504 07/16/19 0424 07/17/19 0257  WBC 21.0* 16.7* 11.8* 5.6 4.9  NEUTROABS  --   --  10.7* 4.4 3.5  HGB 10.9* 9.7* 10.4* 10.4* 10.3*  HCT 33.0* 29.9* 31.9* 31.3* 30.7*  MCV 84.6 85.4 83.3 82.8 83.0  PLT 289 197 209 184 150   Cardiac Enzymes: No results for input(s): CKTOTAL, CKMB, CKMBINDEX, TROPONINI in the last 168 hours. Sepsis Labs: Recent Labs  Lab 07/09/2019 1344 07/17/2019 1559 07/14/19 0600 07/15/19 0504 07/16/19 0424 07/17/19 0257  PROCALCITON  --   --  28.60 12.26 5.42  --   WBC  --   --  16.7* 11.8* 5.6 4.9  LATICACIDVEN 2.6* 2.4* 1.2 1.0  --   --     Procedures/Operations    CT chest 07/19/2019  Chest x-ray 08/03/2019, 07/27/19  Modified barium swallow 07/15/2019  Abdominal films 07/27/2019  Irine Seal 07/27/2019, 9:33 AM

## 2019-08-09 DEATH — deceased

## 2020-04-30 IMAGING — DX DG CHEST 1V PORT
1 series · 1 of 1 positions shown · non-contrast
Comparison: Chest x-ray 01/27/2017.

CLINICAL DATA: [AGE] male with history of syncope. Loss of
consciousness for 1-2 minutes.

EXAM:
PORTABLE CHEST 1 VIEW

[chest]
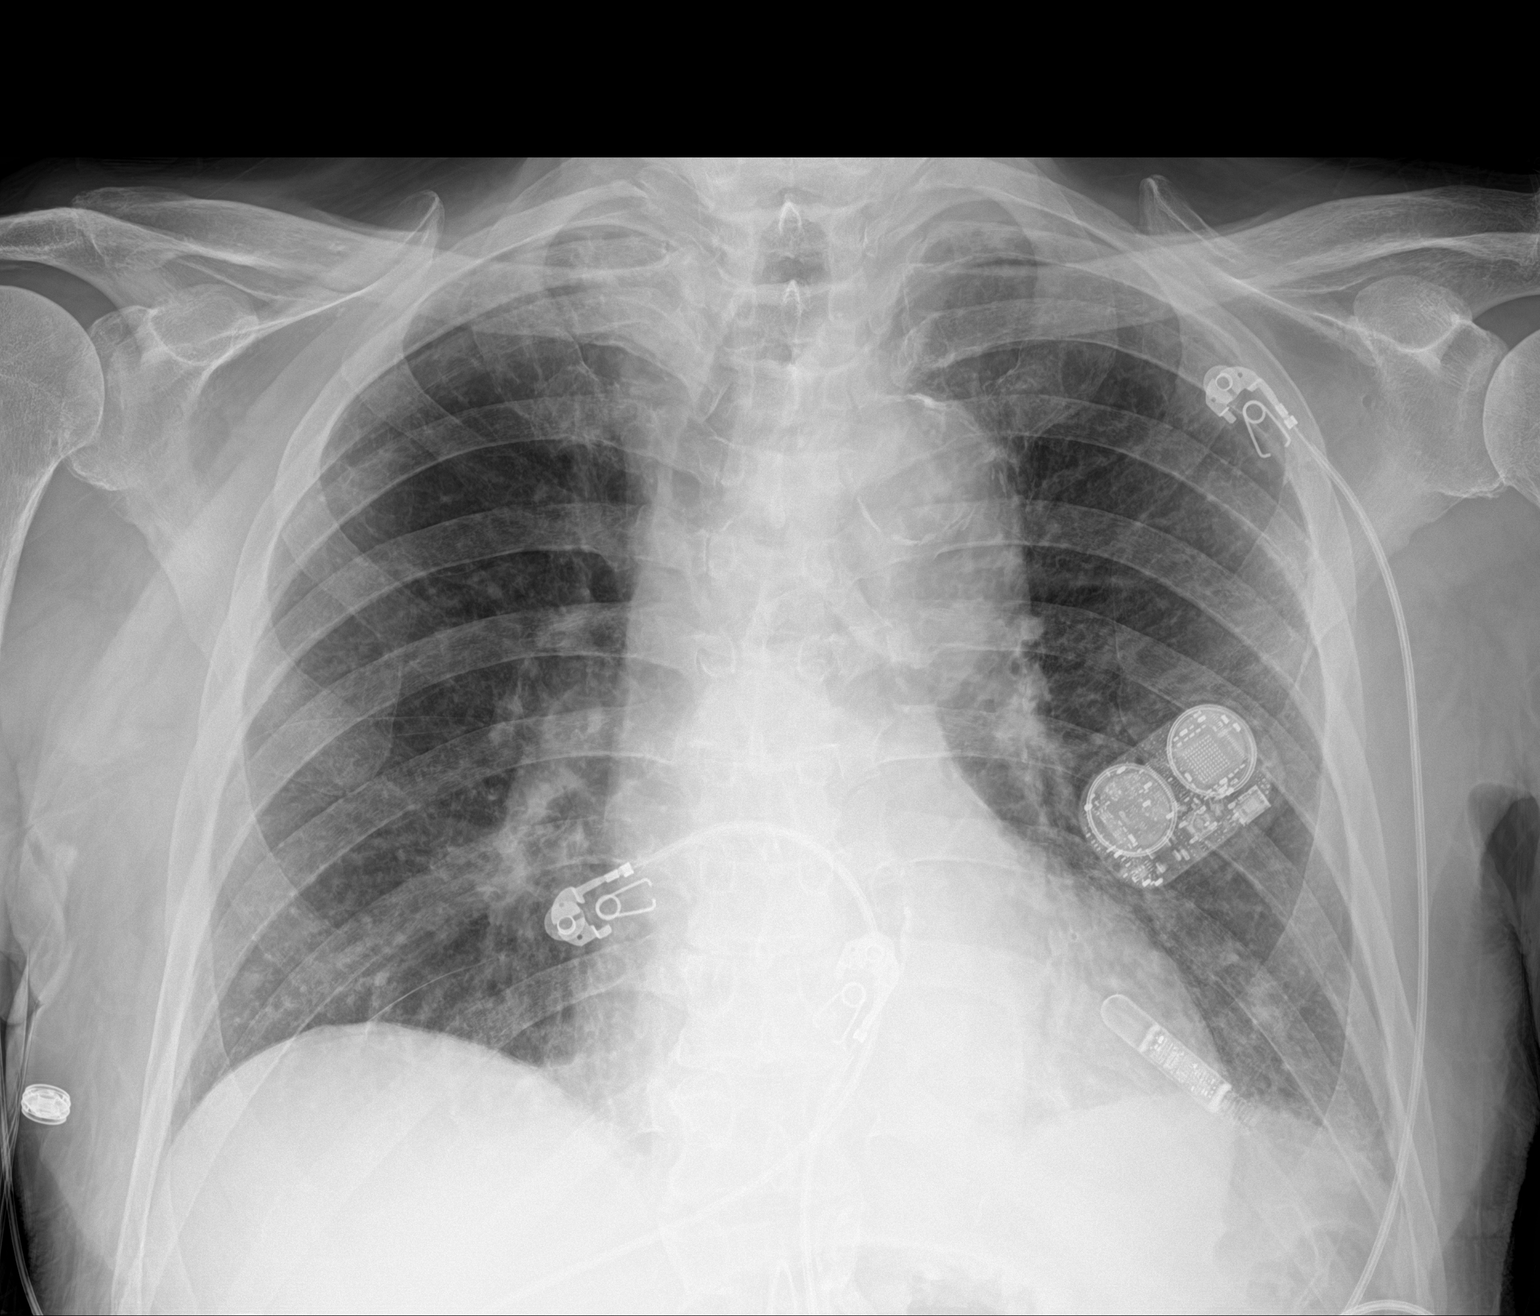

[1 of 1 positions shown; findings below may reference images not displayed]

FINDINGS: Lung volumes are low. Mild diffuse interstitial prominence which
appears to be chronic and is similar to prior examinations. No
consolidative airspace disease. No pleural effusions. No
pneumothorax. No pulmonary nodule or mass noted. Pulmonary
vasculature and the cardiomediastinal silhouette are within normal
limits. Aortic atherosclerosis. Electronic device is projecting over
the left hemithorax likely to represent implantable loop recorder
is.
IMPRESSION: 1. Low lung volumes without radiographic evidence of acute
cardiopulmonary disease.
2. Aortic atherosclerosis.

## 2021-05-18 IMAGING — CT CT HEAD W/O CM
3 series · 14 of 47 positions shown, 16 images · non-contrast
Comparison: CT head 06/17/2019 and 07/26/2018.

CLINICAL DATA: Follow up intracranial hemorrhage. History
intracranial aneurysms.

EXAM:
CT HEAD WITHOUT CONTRAST
TECHNIQUE: Contiguous axial images were obtained from the base of the skull
through the vertex without intravenous contrast.

[Series 3: head 5.0 h30s · axial · 0.45mm/px · z∈[-115,+25]mm · 8 of 34 slices shown, 10 images]
[im 3/34  brain]
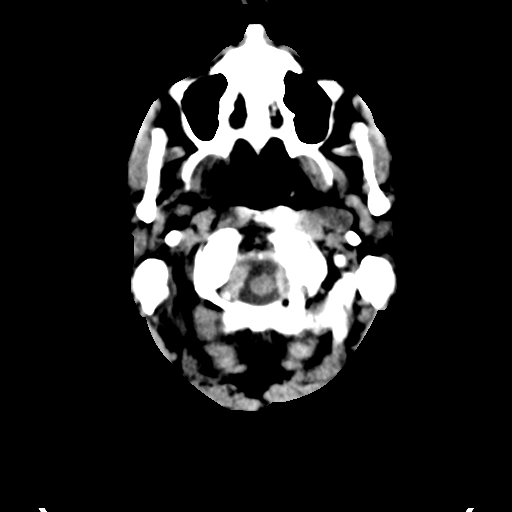
[im 3/34  bone]
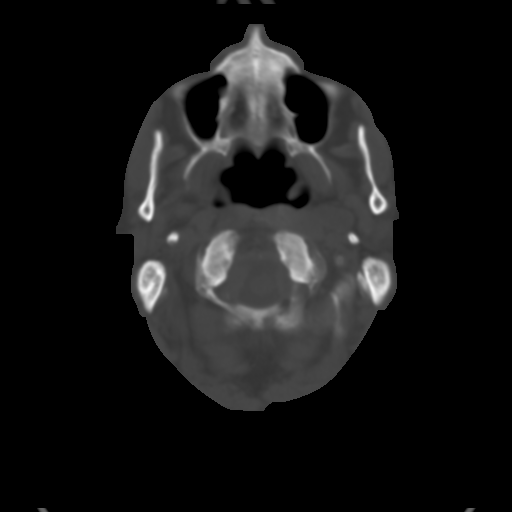
[im 7/34  brain]
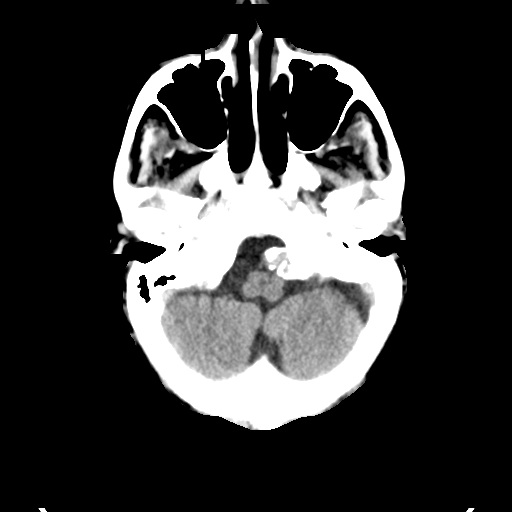
[im 11/34  brain]
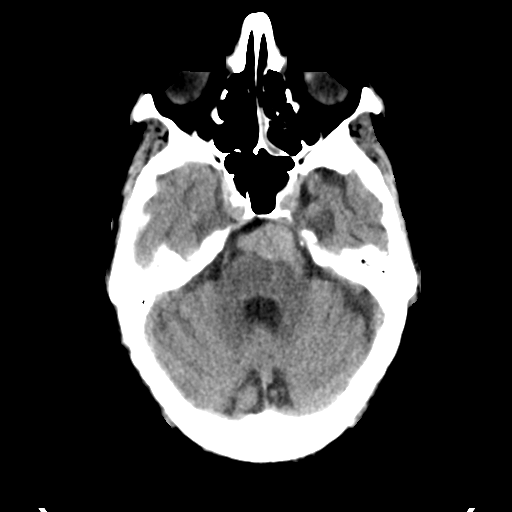
[im 15/34  brain]
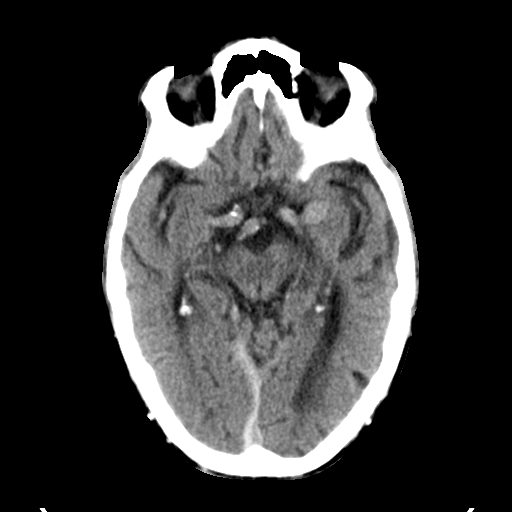
[im 19/34  brain]
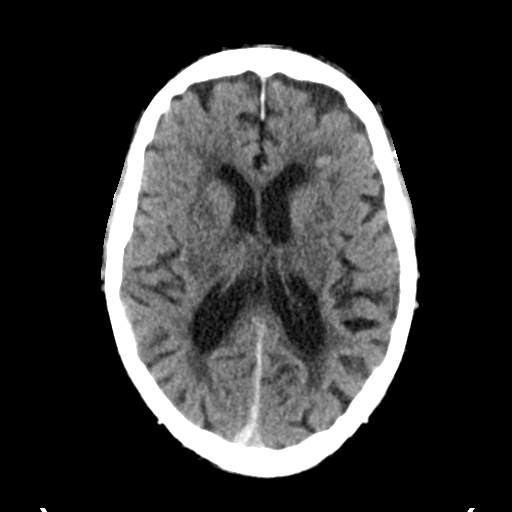
[im 19/34  bone]
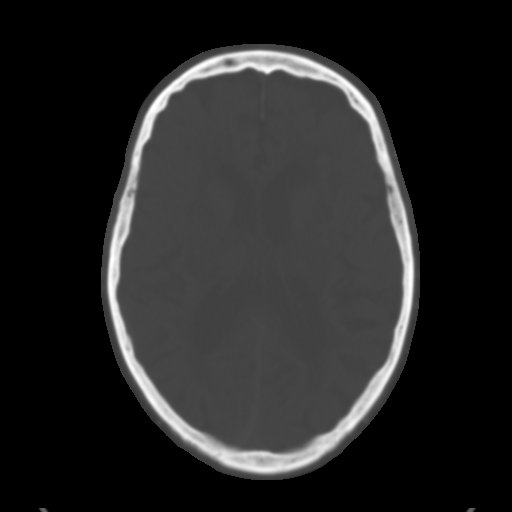
[im 23/34  brain]
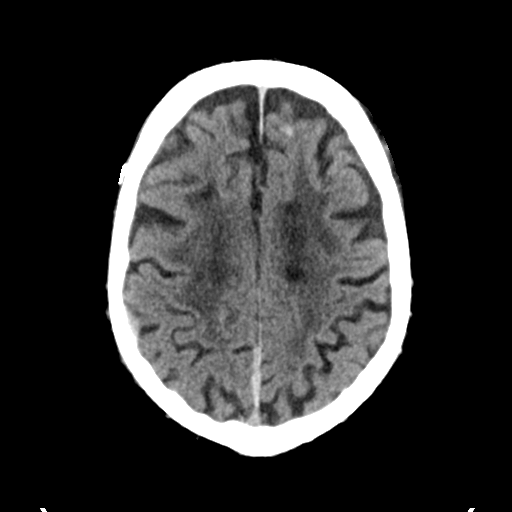
[im 27/34  brain]
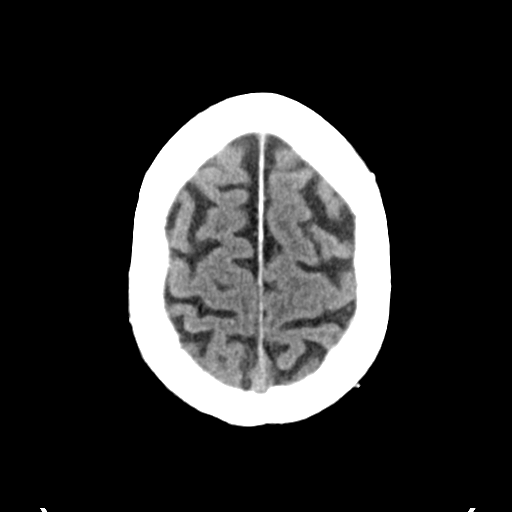
[im 31/34  brain]
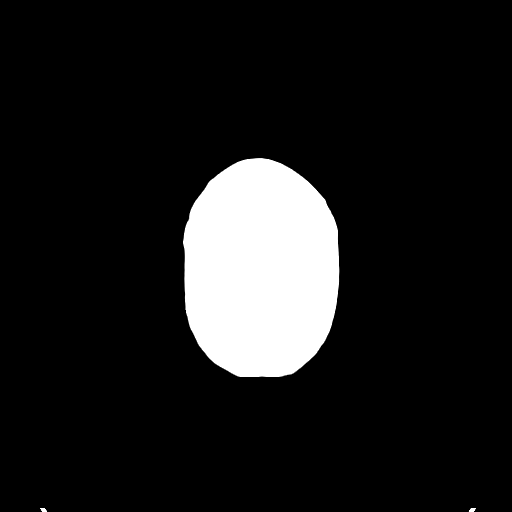

[Series 5: head 3.0 mpr cor · coronal · 0.33mm/px · 3 of 74 slices shown]
[im 25/74  brain]
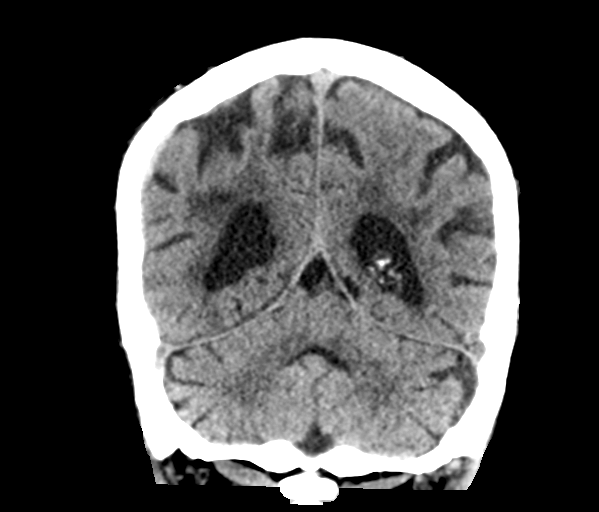
[im 33/74  brain]
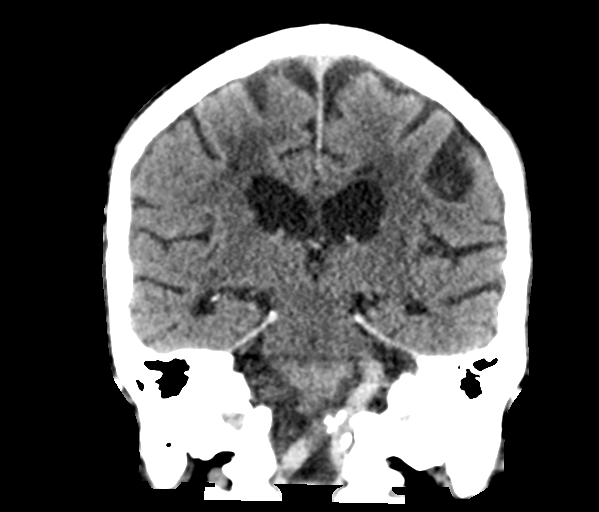
[im 41/74  brain]
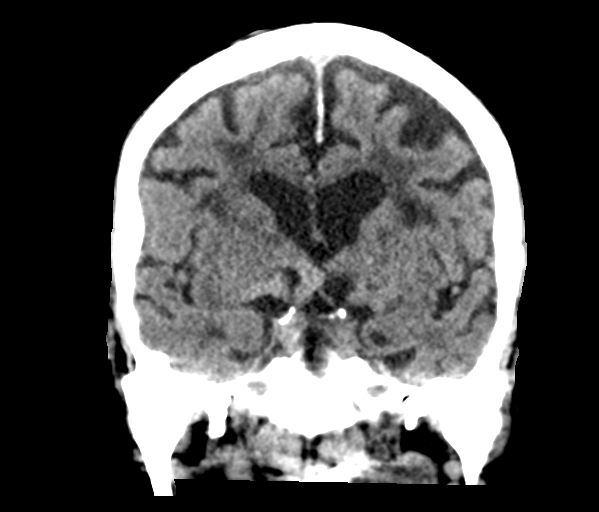

[Series 6: head 3.0 mpr sag · sagittal · 0.33mm/px · 3 of 67 slices shown]
[im 23/67  brain]
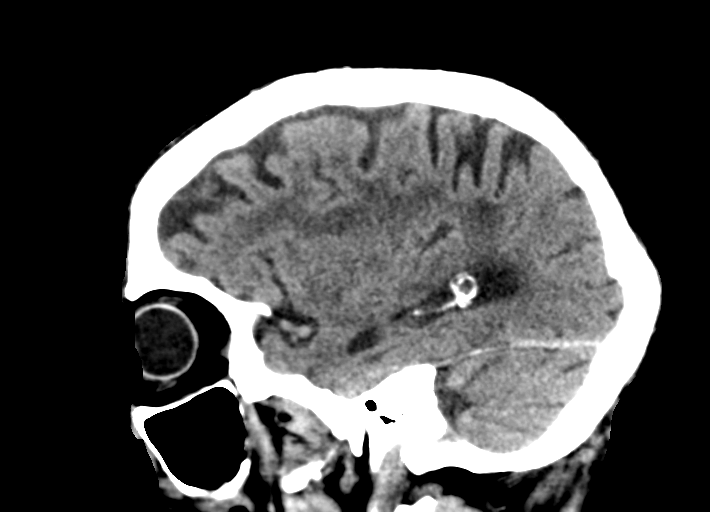
[im 34/67  brain]
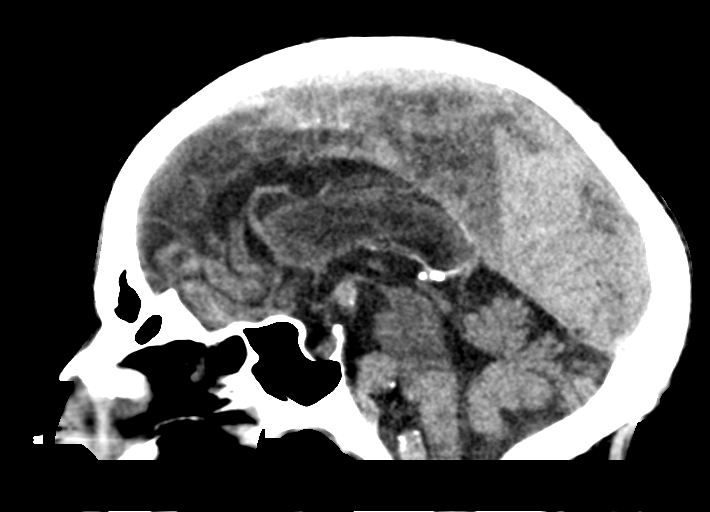
[im 45/67  brain]
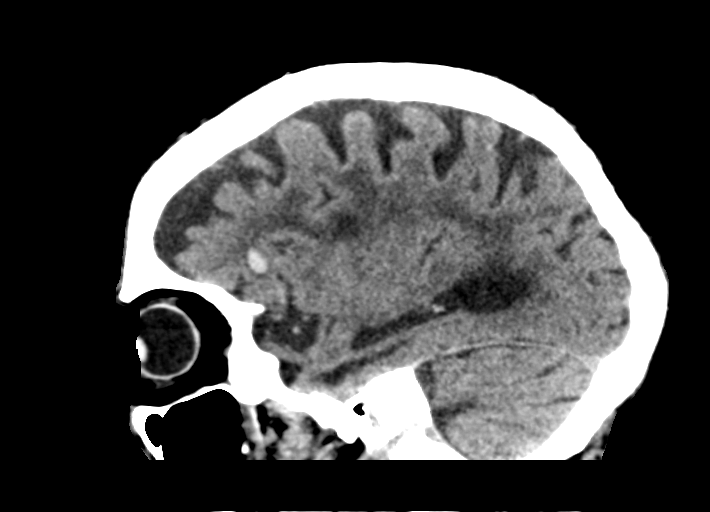

[14 of 47 positions shown; findings below may reference images not displayed]

FINDINGS: Brain: Small foci of acute hemorrhage in the left frontal
periventricular white matter are stable, measuring up to 8 x 6 mm on
a thin subdural hematoma along the posterior right aspect of the
interhemispheric falx and tentorium, measuring 4 mm on image [DATE].
This also appears unchanged. No new areas of intracranial hemorrhage
are identified. Specifically, no evidence of subarachnoid
hemorrhage. There is generalized atrophy with prominence of the
ventricles and subarachnoid spaces. Extensive chronic small vessel
ischemic changes are present in the periventricular white matter
bilaterally. No evidence of acute cortical stroke.

Vascular: Diffuse intracranial atherosclerosis again noted with
dolichoectasia of the vertebrobasilar system. There is a large
fusiform aneurysm of the basilar artery, measuring 1.7 x 1.7 cm
transverse, mildly increased from the study of 07/26/2018 (1.5 cm).
There is a fusiform aneurysm of the proximal left middle cerebral
artery, measuring 9 x 9 mm, not significantly changed.

Skull: No acute or focal findings.

Sinuses/Orbits: The visualized paranasal sinuses, mastoid air cells
and middle ears are clear. No acute orbital findings. Previous lens
surgery on the right.

Other: None.
IMPRESSION: 1. Stable small foci of acute hemorrhage in the left frontal
periventricular white matter.
2. Stable thin subdural hematoma along the posterior right aspect of
the interhemispheric falx and tentorium. No new areas intracranial
hemorrhage.
3. Stable fusiform aneurysm of the basilar artery and the proximal
left middle cerebral artery.
4. Stable atrophy and chronic small vessel ischemic changes.

## 2021-05-25 IMAGING — DX DG CHEST 1V PORT
2 series · 2 of 2 positions shown · non-contrast
Comparison: 11/09/2018

CLINICAL DATA: Syncope

EXAM:
PORTABLE CHEST 1 VIEW

[chest ap (1 of 2)]
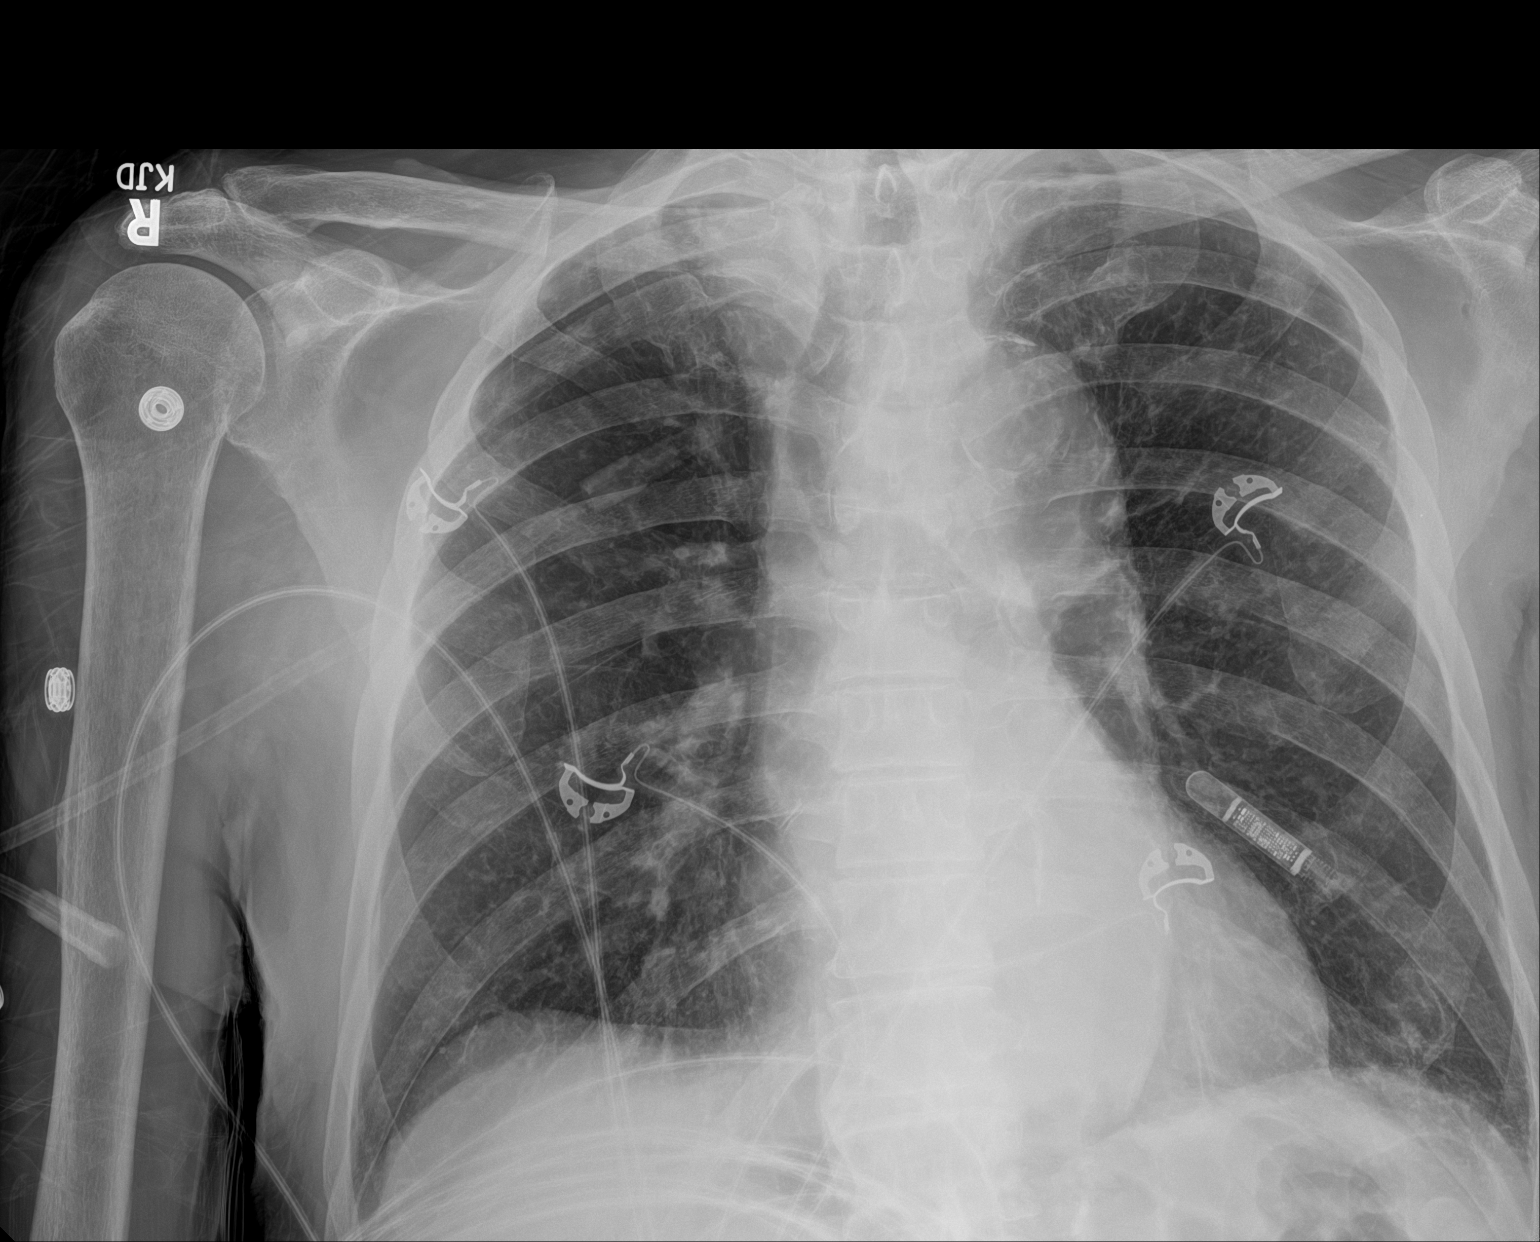

[chest ap (2 of 2)]
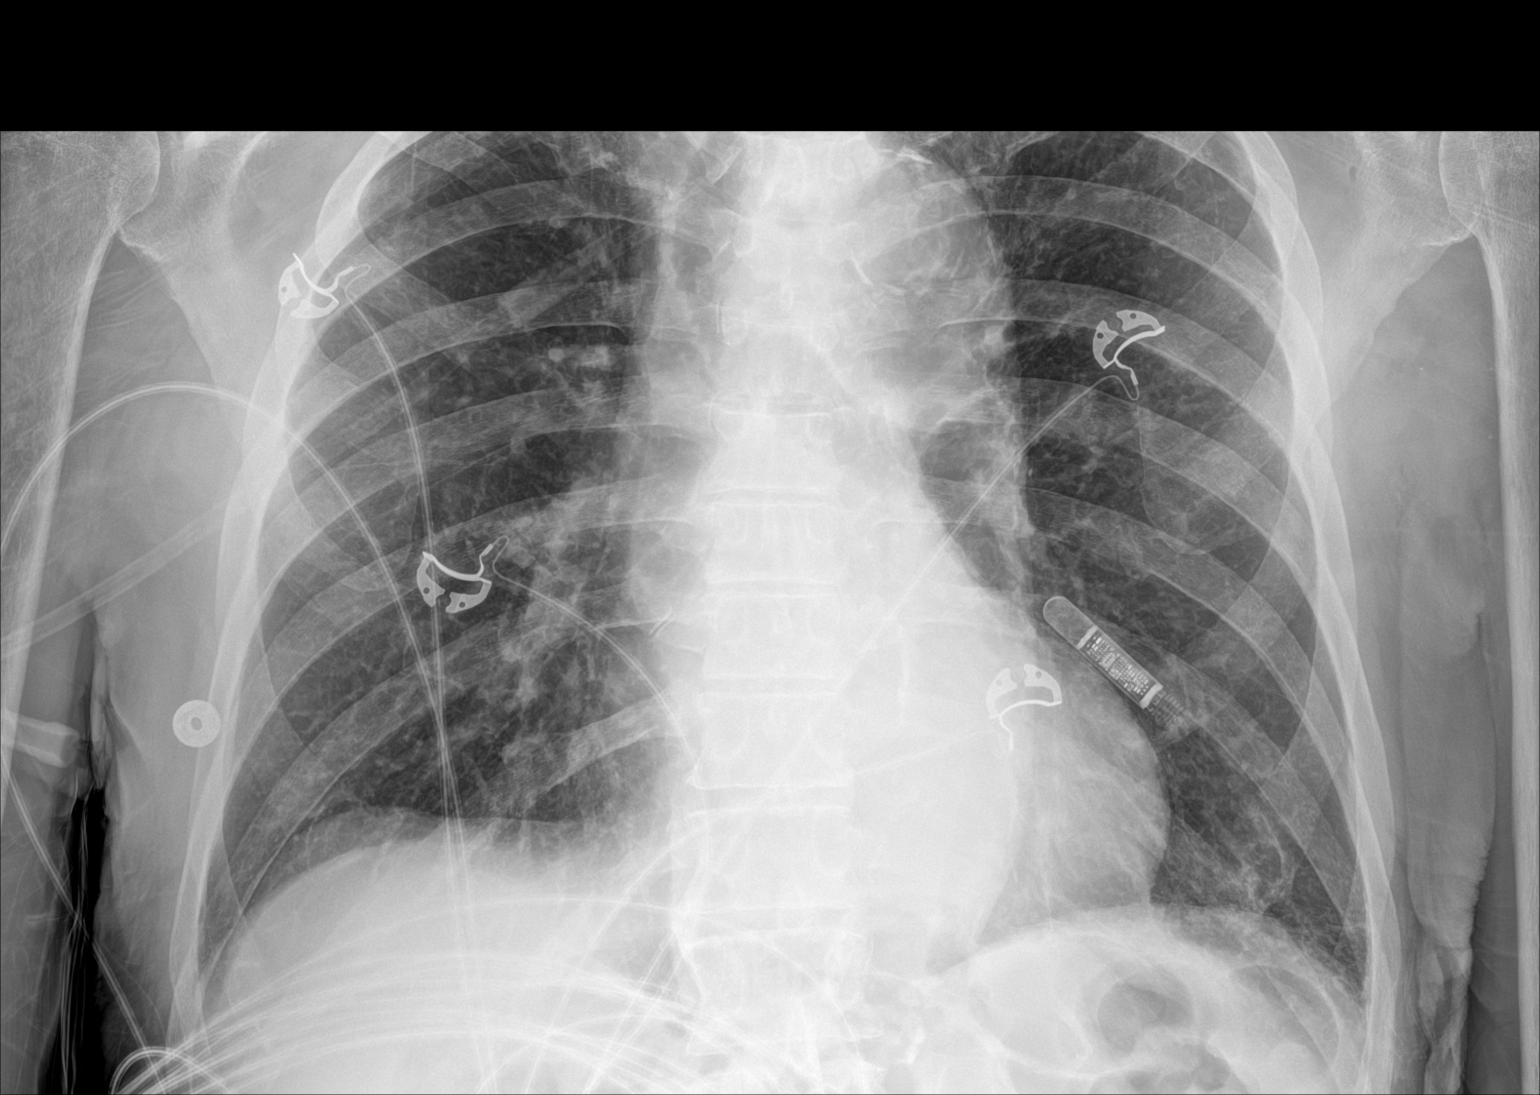

[2 of 2 positions shown; findings below may reference images not displayed]

FINDINGS: Heart is normal size. Tortuous, calcified aorta. No confluent
airspace opacities or effusions. No acute bony abnormality. Loop
recorder device in place.
IMPRESSION: No acute cardiopulmonary disease

## 2021-05-28 IMAGING — DX DG CHEST 1V PORT
1 series · 1 of 1 positions shown · non-contrast
Comparison: Chest x-rays dated 06/25/2019 and 11/11/2018

CLINICAL DATA: Fever.

EXAM:
PORTABLE CHEST 1 VIEW

[chest ap]
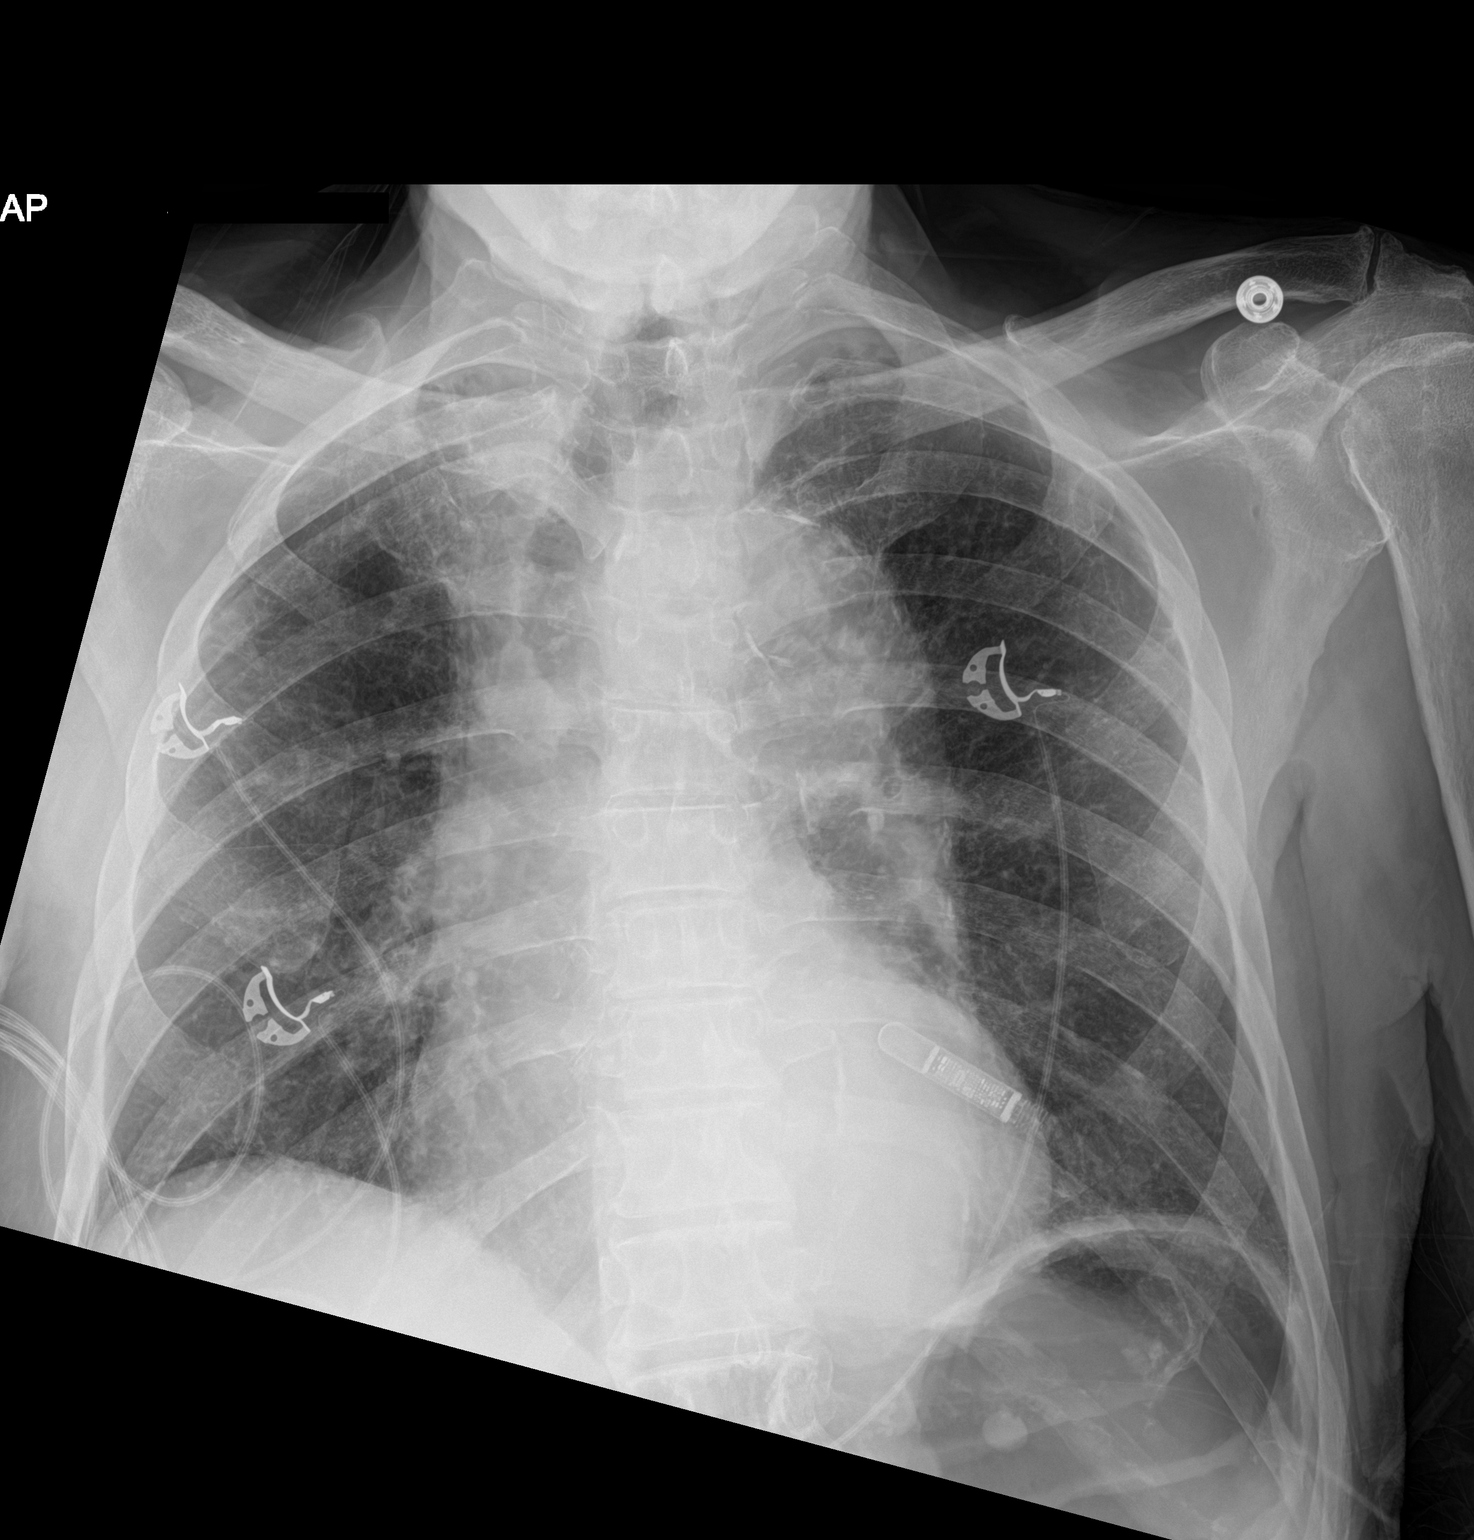

[1 of 1 positions shown; findings below may reference images not displayed]

FINDINGS: Heart size and pulmonary vascularity are normal. Aortic
atherosclerosis. Lungs are clear. Loop recorder in place. No
significant bone abnormality.
IMPRESSION: 1. No acute abnormalities.
2.  Aortic Atherosclerosis ([id number]).
